# Patient Record
Sex: Female | Born: 1950 | Race: Black or African American | Hispanic: No | Marital: Married | Smoking: Former smoker
Health system: Southern US, Community
[De-identification: ages and names within clinical notes are randomized; demographics above are authoritative.]

## PROBLEM LIST (undated history)

## (undated) DIAGNOSIS — M25552 Pain in left hip: Secondary | ICD-10-CM

## (undated) DIAGNOSIS — H43399 Other vitreous opacities, unspecified eye: Secondary | ICD-10-CM

## (undated) DIAGNOSIS — F1193 Opioid use, unspecified with withdrawal: Secondary | ICD-10-CM

## (undated) DIAGNOSIS — M545 Low back pain, unspecified: Secondary | ICD-10-CM

## (undated) DIAGNOSIS — M79601 Pain in right arm: Secondary | ICD-10-CM

## (undated) DIAGNOSIS — R55 Syncope and collapse: Secondary | ICD-10-CM

## (undated) DIAGNOSIS — S0990XA Unspecified injury of head, initial encounter: Secondary | ICD-10-CM

## (undated) DIAGNOSIS — M329 Systemic lupus erythematosus, unspecified: Secondary | ICD-10-CM

## (undated) DIAGNOSIS — R569 Unspecified convulsions: Secondary | ICD-10-CM

## (undated) DIAGNOSIS — R42 Dizziness and giddiness: Secondary | ICD-10-CM

## (undated) DIAGNOSIS — E119 Type 2 diabetes mellitus without complications: Secondary | ICD-10-CM

## (undated) DIAGNOSIS — IMO0002 Reserved for concepts with insufficient information to code with codable children: Secondary | ICD-10-CM

## (undated) HISTORY — PX: ABDOMINAL HYSTERECTOMY: SHX81

---

## 2004-07-18 NOTE — ED Provider Notes (Signed)
[facility]                      EMERGENCY DEPARTMENT TREATMENT REPORT   NAME:  [name] [name]                    PT. LOCATION:     ER  ER25   MR #:         BILLING #: [id number]          DOS: 07/18/2004   TIME: 9:49 P   [id number]   cc:   Primary Physician:   Time of my evaluation: 2136.   CHIEF COMPLAINT:  Foot hurt.   HISTORY OF PRESENT ILLNESS: This is a 53 year old female with the complaint   of right foot pain now for about a week. It is swollen and painful.  She   saw her doctor and was put on Keflex yesterday, but states it has not   helped. She cannot take the pain anymore. Describes a throbbing pain.   Denies any history of injury. She has had no fever.  Has not had anything   like this previously.   REVIEW OF SYSTEMS:   CONSTITUTIONAL:  No fever, chills, weight loss.   ENT: No sore throat, runny nose or other URI symptoms.   RESPIRATORY:  No cough, shortness of breath, or wheezing.   CARDIOVASCULAR:  No chest pain, chest pressure, or palpitations.   GASTROINTESTINAL:  No vomiting, diarrhea, or abdominal pain.   MUSCULOSKELETAL:   Positive for right foot pain.   INTEGUMENTARY: Positive for swelling of the foot.   NEUROLOGICAL:  No headaches, sensory or motor symptoms.   Denies complaints in any other system.   PAST MEDICAL HISTORY:  Pancreatitis, asthma, peptic ulcer, osteoarthritis,   ____.   MEDICATIONS:   Albuterol nebulizer, amitriptyline, cephalexin, Mobic,   prednisone, Premarin, Protonix, ferrous sulfate, alprazolam, Soma,   glycosamine, Vicodin.   ALLERGIES:  Aspirin, calcium, and some medicines with Tylenol (above a   certain dose of Tylenol and she breaks out).   SOCIAL HISTORY: She is accompanied by her daughter.   PHYSICAL EXAMINATION:   GENERAL APPEARANCE: This is a well-developed female, appears   uncomfortable.   VITAL SIGNS: Blood pressure 124/85, pulse 95, respirations 20, temperature   96, O2 saturation 97% on room air.    HEENT:   Eyes:  Conjunctivae clear, lids normal.  Pupils equal,   symmetrical, and normally reactive.   Mouth/Throat:  Surfaces of the   pharynx, palate, and tongue are pink, moist, and without lesions.   NECK:  Supple, nontender, symmetrical, no masses or JVD, trachea midline,   thyroid not enlarged, nodular, or tender.   RESPIRATORY:  Clear and equal breath sounds.  No respiratory distress,   tachypnea, or accessory muscle use.   CARDIOVASCULAR:  Heart regular, without murmurs, gallops, rubs, or thrills.   PMI not displaced.   GASTROINTESTINAL:  Abdomen soft, nontender, without complaint of pain to   palpation.  No hepatomegaly or splenomegaly.   EXTREMITIES:  Right lower extremity: The knee is nontender. Calf is soft   and nontender. 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  Dorsum of the foot is extremely tender, mostly over the 2nd and   3rd  MTP region.  I cannot appreciate any obvious bony or soft tissue   abnormality.  She does have good pedal pulses, sensation intact, good   capillary refill.  The foot is warm and dry. There is no overlying erythema,   no fluctuance, no crepitus.  The calcaneus is nontender.  Achilles is   intact and nontender.   CONTINUATION BY JULIA HUBBARD, PA-C:   INITIAL IMPRESSION/MANAGEMENT PLAN:   This is a 53 year old female presents   for evaluation of right foot pain. At this time an IV will be started. She   will be given Dilaudid and Phenergan, and will obtain x-rays of the right   foot.   DIAGNOSTIC STUDIES:   X-rays of the right foot show no definite fracture   per ED attending.   COURSE IN THE EMERGENCY DEPARTMENT:   Dr. Claudette Laws was in to re-evaluate the   patient.  Findings were discussed that this may represent an inflammatory   process.  She was given Decadron.  She states she was given prednisone by   her doctor but did not fill it.  This time we will place her in a soft wrap    and walker with instructions provided. She will be given prescriptions for   Dilaudid 2 mg #20 and prednisone to continue.  I would like her call Dr.   Cletis Media tomorrow to scheduled follow up appointment.  Limit activity.   Certainly seek medical attention at anytime for new symptoms, any concerns.   Advised x-rays will be formally read by the radiologist.   FINAL DIAGNOSIS:  Acute right foot pain, possible tendonitis.   DISPOSITION:  The patient will be discharged home in stable condition.  To   follow up as above.  The patient evaluated by myself and Dr. Thane Edu   who agrees with above assessment plan.   Electronically Signed By:   Thornton Dales, M.D. 07/20/2004   04:44   ____________________________   Thornton Dales, M.D.   jdm/dh  D:  07/18/2004  T:  07/18/2004  9:54 P   [id number]   Fara Chute, PA

## 2010-10-10 LAB — AMB POC AVEE DRUG SCREEN: TRICYCLICS UR POC: POSITIVE

## 2010-10-10 MED ORDER — ALPRAZOLAM 0.5 MG TAB
0.5 mg | ORAL_TABLET | Freq: Three times a day (TID) | ORAL | Status: DC | PRN
Start: 2010-10-10 — End: 2010-12-03

## 2010-10-10 MED ORDER — OXYCODONE-ACETAMINOPHEN 10 MG-325 MG TAB
10-325 mg | ORAL_TABLET | Freq: Every day | ORAL | Status: DC
Start: 2010-10-10 — End: 2010-10-10

## 2010-10-10 MED ORDER — OXYCODONE-ACETAMINOPHEN 10 MG-325 MG TAB
10-325 mg | ORAL_TABLET | Freq: Every day | ORAL | Status: DC
Start: 2010-10-10 — End: 2010-12-03

## 2010-10-10 MED ORDER — LIDOCAINE 2 % MUCOSAL SOLN
2 % | Status: DC | PRN
Start: 2010-10-10 — End: 2014-11-26

## 2010-10-10 MED ORDER — ZOLPIDEM 10 MG TAB
10 mg | ORAL_TABLET | Freq: Every evening | ORAL | Status: DC | PRN
Start: 2010-10-10 — End: 2010-12-03
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
## 2010-10-25 NOTE — Telephone Encounter (Signed)
Returned patients call and there was no answer.  Message was left to return call.

## 2010-10-28 NOTE — Telephone Encounter (Signed)
Patient called and stated that she was having some increased pain.  Patient stated that she wanted sooner appointment.  First available was end of March.  Appointment was offered to patient and patient stated that she would wait until December 03, 2010, her scheduled appointment.

## 2010-12-03 MED ORDER — ZOLPIDEM 10 MG TAB
10 mg | ORAL_TABLET | Freq: Every evening | ORAL | Status: DC | PRN
Start: 2010-12-03 — End: 2011-01-30

## 2010-12-03 MED ORDER — CARISOPRODOL 250 MG TAB
250 mg | ORAL_TABLET | Freq: Three times a day (TID) | ORAL | Status: DC | PRN
Start: 2010-12-03 — End: 2011-02-27

## 2010-12-03 MED ORDER — OXYCODONE-ACETAMINOPHEN 10 MG-325 MG TAB
10-325 mg | ORAL_TABLET | Freq: Every day | ORAL | Status: DC
Start: 2010-12-03 — End: 2010-12-03

## 2010-12-03 MED ORDER — OXYCODONE-ACETAMINOPHEN 10 MG-325 MG TAB
10-325 mg | ORAL_TABLET | Freq: Every day | ORAL | Status: DC
Start: 2010-12-03 — End: 2011-01-30

## 2010-12-03 MED ORDER — ALPRAZOLAM 0.5 MG TAB
0.5 mg | ORAL_TABLET | Freq: Three times a day (TID) | ORAL | Status: DC | PRN
Start: 2010-12-03 — End: 2011-01-30

## 2010-12-03 NOTE — Progress Notes (Signed)
HISTORY OF PRESENT ILLNESS  [name] [name] is a 60 y.o. female. Patient presents for follow up of chronic widespread joint pain due to osteoarthritis, neck pain due to cervical DDD, and leg pain due to diabetic neuropathy. 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 She is a poor historian, and a highly complex patient.  She complains of frequent falls due to leg weakness. She relates this to a history of CVA three years ago, which has left her with residual weakness of the right side. She also reports that lupus causes intermittent leg pain and weakness. She states that Soma prescribed by Dr. Towana [name] in the past was very helpful for leg pain and weakness. I discussed with my concern that [name] [name] would actually increase her risk of falling, as it can be sedating in some patients. She adamantly states that conversely, she feels that her balance and strength is better when she takes this. We will allow her to take 250 mg sparingly as needed for spasms.  Neck pain and spasms continue to be constant and relatively severe. She describes the pain as burning, aching, tight, and tender. Pt reports average of 10/10 pain scale most days. She sees a rheumatologist at [facility] for treatment of lupus, for which she takes daily Prednisone. She cannot recall the dose.   She also continues to complain of painful burning pain in the legs and feet, which is attributable to diabetic neuropathy. She states that her blood glucose levels often stay high, between 150-200 in the mornings. She does not take insulin, but only takes Metformin twice daily.  Percocet helps significantly for pain, and she tolerates this well, without sedation or cognitive dysfunction. She finds, however, that it only lasts about 4 hours before the pain returns. We discussed treatment options at length--see treatment plan below.   She also takes Xanax "because I get depressed". She also acknowledges that she suffers with anxiety attacks that started after the death of her 26 year old granddaughter in March 2009 from sickle cell anemia. She declines counseling or psychiatry consult.  Sleep continues to be poor due to pain, but elavil and ambien together work well.  Time spent with patient discussing treatment plan and coordinating care: 50 minutes      HPI--see above    Review of Systems   HENT: Positive for neck pain.    Musculoskeletal: Positive for myalgias, back pain and joint pain.   Neurological: Positive for tingling, sensory change, focal weakness (of legs and feet) and headaches.   Psychiatric/Behavioral: Positive for depression. Negative for suicidal ideas. The patient is nervous/anxious and has insomnia (ambien with Elavil works well).      Physical Exam   Constitutional: She is oriented to person, place, and time. She appears well-developed and well-nourished. No distress.        In visible discomfort  Ambulates gingerly with walker     HENT:   Head: Normocephalic and atraumatic.   Eyes: EOM are normal.   Musculoskeletal:        Right elbow: tenderness found.        Left elbow: tenderness found.        Right hip: She exhibits tenderness.        Left hip: She exhibits tenderness.        Right knee: tenderness found.        Left knee: tenderness found.        Cervical back: She exhibits decreased range of motion, tenderness, bony tenderness, pain and spasm.  Lumbar back: She exhibits tenderness, bony tenderness, pain and spasm.        Back:         Arms:       Legs:       Areas of tenderness noted with red arrows  Brisk sacroiliac tenderness noted  Marked spasms of lumbar paraspinous musculature noted  Trochanteric bursae tender to palpation bilaterally      Neurological: She is alert and oriented to person, place, and time. She displays atrophy. A sensory deficit is present. No cranial nerve deficit. She exhibits normal muscle tone. Gait abnormal. Coordination normal.        Diminished sensation of lower tibias, right worse than left  Diminished muscle strength of right hand and right quadriceps, with mild atrophy of thenar eminence and quadriceps noted   Psychiatric: She has a normal mood and affect. Her behavior is normal. Judgment and thought content normal.        Occasionally tearful when recalling the death of her granddaughter       ASSESSMENT and PLAN  1. Rib pain    2. Pain in joint, multiple sites    3. Pain in limb    4. Idiopathic progressive polyneuropathy    5. Type II or unspecified type diabetes mellitus without mention of complication, not stated as uncontrolled    6. Lupus    7. Osteoarthrosis, unspecified whether generalized or localized, unspecified site    8. Anxiety state, unspecified    9. Neuralgia, neuritis, and radiculitis, unspecified    10. Myalgia and myositis, unspecified      Plan:  Increase percocet to up to six times daily for pain (every 4 hours)  We will prescribe soma 250 mg, but only take it when needed. Do not take this with Xanax, because it can increase your risk of falls  If you feel that the depression is getting worse, we can refer you to a good therapist or psychiatrist  2 month follow up or sooner if needed  For acute issues that cannot wait until your next office visit: my direct line is [phone] (do not call this line for scheduling regular appointments)

## 2010-12-03 NOTE — Progress Notes (Signed)
Oxycodone/apap 10/325 mg, did not bring

## 2010-12-03 NOTE — Patient Instructions (Signed)
Plan:  Increase percocet to up to six times daily for pain (every 4 hours)  We will prescribe soma 250 mg, but only take it when needed. Do not take this with Xanax, because it can increase your risk of falls  If you feel that the depression is getting worse, we can refer you to a good therapist or psychiatrist  2 month follow up or sooner if needed  For acute issues that cannot wait until your next office visit: my direct line is [phone] (do not call this line for scheduling regular appointments)
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
## 2011-01-04 NOTE — Progress Notes (Signed)
Addended by: Sanjuana Letters on: 01/04/2011 10:19 PM     Modules accepted: Level of Service

## 2011-01-30 MED ORDER — OXYCODONE-ACETAMINOPHEN 10 MG-325 MG TAB
10-325 mg | ORAL_TABLET | Freq: Every day | ORAL | Status: DC
Start: 2011-01-30 — End: 2011-02-27

## 2011-01-30 MED ORDER — ZOLPIDEM 10 MG TAB
10 mg | ORAL_TABLET | Freq: Every evening | ORAL | Status: DC | PRN
Start: 2011-01-30 — End: 2011-07-17

## 2011-01-30 MED ORDER — ALPRAZOLAM 0.5 MG TAB
0.5 mg | ORAL_TABLET | Freq: Three times a day (TID) | ORAL | Status: DC | PRN
Start: 2011-01-30 — End: 2011-06-20

## 2011-01-30 NOTE — Progress Notes (Signed)
Oxycodone/apap 10/325 mg, did not bring.

## 2011-01-30 NOTE — Patient Instructions (Signed)
Plan:  Continue same medications as prescribed for chronic pain--BRING ALL MEDICATIONS TO EACH VISIT  Continue xanax 0. 5 mg up to three times daily as needed--do not take more than is prescribed  We will refer you to a psychiatrist for management of depression and anxiety  Follow up in 2 months or sooner if needed  Questions or Concerns: my direct line is [phone]

## 2011-01-30 NOTE — Progress Notes (Signed)
HISTORY OF PRESENT ILLNESS  [name] [name] is a 60 y.o. female. Patient presents for follow up of chronic widespread joint pain due to osteoarthritis, neck pain due to cervical DDD, and leg pain due to diabetic neuropathy. 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 She is a poor historian.  She states that she has been "shaking like a leaf" for the past week. She describes that these tremors began in the hands and arms abruptly a week ago. She could not recall an inciting event, but she does note that she has been under extreme stress since her 2 year old daughter found out she was pregnant (she is 8th grade). She is tearful as she recounts this story, and she is devastated by the news. As we discussed this further, she admits that she self-increased her Xanax and ran out a week ago. She denies any seizure activity. We discussed the importance of not self-increasing medications. She is very tearful and apologetic.   She does note that Percocet works very well for pain, and is well tolerated. She was reminded to bring all medications to each visit.  Pt denies new or worsening insomnia or constipation issues.    HPI--see above    ROS  HENT: Positive for neck pain.    Musculoskeletal: Positive for myalgias, back pain and joint pain.   Neurological: Positive for tingling, sensory change, focal weakness (of legs and feet) and headaches.   Psychiatric/Behavioral: Positive for depression. Negative for suicidal ideas. The patient is nervous/anxious and has insomnia (ambien with Elavil works well).    Physical Exam  Constitutional: She is oriented to person, place, and time. She appears well-developed and well-nourished. No distress.        In visible discomfort, tearful and anxious  Ambulates gingerly with walker     HENT:   Head: Normocephalic and atraumatic.   Eyes: EOM are normal.   Musculoskeletal:        Right elbow: tenderness found.        Left elbow: tenderness found.         Right hip: She exhibits tenderness.        Left hip: She exhibits tenderness.        Right knee: tenderness found.        Left knee: tenderness found.        Cervical back: She exhibits decreased range of motion, tenderness, bony tenderness, pain and spasm.        Lumbar back: She exhibits tenderness, bony tenderness, pain and spasm.        Back:         Arms:       Legs:       Areas of tenderness noted with red arrows  Brisk sacroiliac tenderness noted  Marked spasms of lumbar paraspinous musculature noted  Trochanteric bursae tender to palpation bilaterally     Neurological: She is alert and oriented to person, place, and time. She displays atrophy. A sensory deficit is present. No cranial nerve deficit. She exhibits normal muscle tone. Gait abnormal. Coordination normal.        Diminished sensation of lower tibias, right worse than left  Diminished muscle strength of right hand and right quadriceps, with mild atrophy of thenar eminence and quadriceps noted; bilateral hands markedly tremulous   Psychiatric: She has a normal mood and affect. Her behavior is normal. Judgment and thought content normal.        Occasionally tearful   ASSESSMENT and PLAN  1.  Neuralgia, neuritis, and radiculitis, unspecified    2. Neuropathy in diabetes    3. Rib pain    4. Lupus    5. Osteoarthrosis, unspecified whether generalized or localized, unspecified site    6. Myalgia and myositis, unspecified    7. Pain in limb    8. Pain in joint, multiple sites    9. Type II or unspecified type diabetes mellitus without mention of complication, not stated as uncontrolled    10. Arthritis of knee    11. Encounter for long-term (current) use of other medications    12. 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 5 mg up to three times daily as needed--do not take more than is prescribed   We will refer you to a psychiatrist for management of depression and anxiety  Follow up in 2 months or sooner if needed  Questions or Concerns: my direct line is [phone]

## 2011-02-27 MED ORDER — OXYCODONE-ACETAMINOPHEN 10 MG-325 MG TAB
10-325 mg | ORAL_TABLET | Freq: Four times a day (QID) | ORAL | Status: DC
Start: 2011-02-27 — End: 2011-04-22

## 2011-02-27 MED ORDER — OXYCODONE-ACETAMINOPHEN 10 MG-325 MG TAB
10-325 mg | ORAL_TABLET | Freq: Four times a day (QID) | ORAL | Status: DC
Start: 2011-02-27 — End: 2011-02-27

## 2011-02-27 MED ORDER — AMITRIPTYLINE 150 MG TAB
150 mg | ORAL_TABLET | Freq: Every evening | ORAL | Status: DC
Start: 2011-02-27 — End: 2011-07-17

## 2011-02-27 MED ORDER — CARISOPRODOL 250 MG TAB
250 mg | ORAL_TABLET | Freq: Three times a day (TID) | ORAL | Status: DC | PRN
Start: 2011-02-27 — End: 2011-04-22

## 2011-02-27 NOTE — Patient Instructions (Signed)
Plan:  Continue same medications as prescribed for chronic pain--temporary increase in Percocet to up to eight per day for chronic pain  Follow up in 2 months or sooner if needed  For acute issues that cannot wait until your next office visit: my direct line is [phone] (do not call this line for scheduling regular appointments)

## 2011-02-27 NOTE — Progress Notes (Signed)
HISTORY OF PRESENT ILLNESS  [name] [name] is a 60 y.o. female. Patient presents for follow up of chronic widespread joint pain due to osteoarthritis, neck pain due to cervical DDD, and leg pain due to diabetic neuropathy. 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  She reports that she fell 10 days ago while trying to pick apples off of a tree in her front yard and injured her tailbone and right foot. She was diagnosed with a coccygeal fracture. While she was at the hospital, she suffered with sudden cognitive changes and altered mental status, and her blood pressure, when checked, was extremely low. She was therefore admitted to [facility], where she stayed for a week until they could figure out why she had become so hypotensive. Aparently, no concrete cause was found, so they discharged her after seven days. She has had no recurrence of her symptoms, but still complains of severe pain in the tailbone. She describes the pain as sharp, aching, and exquisitely tender. She also complains of severe lower back spasms. Pt reports average of 10/10 pain scale for the past ten days. .  Percocet continues to work fairly well for pain, but she finds that she needs extra percocet due to pain. We discussed treatment options at length--see treatment plan below.  She finds that she sleeps best with a combination of Ambien and Amitriptyline, and tolerates this combination quite well. No constipation issues noted.    HPI--see above    ROS  HENT: Positive for neck pain.    Musculoskeletal: Positive for myalgias, back pain and joint pain.   Neurological: Positive for tingling, sensory change, focal weakness (of legs and feet) and headaches.   Psychiatric/Behavioral: Positive for depression. Negative for suicidal ideas. The patient is nervous/anxious and has insomnia (ambien with Elavil works well).    Physical Exam   Constitutional: She is oriented to person, place, and time. She appears well-developed and well-nourished. No distress.        In visible discomfort, tearful and anxious  Ambulates gingerly with walker     HENT:   Head: Normocephalic and atraumatic.   Eyes: EOM are normal.   Musculoskeletal:        Right elbow: tenderness found.        Left elbow: tenderness found.        Right hip: She exhibits tenderness.        Left hip: She exhibits tenderness.        Right knee: tenderness found.        Left knee: tenderness found.        Cervical back: She exhibits decreased range of motion, tenderness, bony tenderness, pain and spasm.        Lumbar back: She exhibits tenderness, bony tenderness, pain and spasm.        Back:         Arms:       Legs:       Areas of tenderness noted with red arrows  Coccyx exquisitely tender to light palpation  Marked spasms of lumbar paraspinous musculature noted  Trochanteric bursae tender to palpation bilaterally     Neurological: She is alert and oriented to person, place, and time. She displays atrophy. A sensory deficit is present. No cranial nerve deficit. She exhibits normal muscle tone. Gait abnormal. Coordination normal.        Diminished sensation of lower tibias, right worse than left  Diminished muscle strength of right hand and right quadriceps, with mild atrophy  of thenar eminence and quadriceps noted; bilateral hands markedly tremulous   Psychiatric: She has a normal mood and affect. Her behavior is normal. Judgment and thought content normal.        Occasionally tearful   ASSESSMENT and PLAN  1. Coccygeal fracture    2. Neuralgia, neuritis, and radiculitis, unspecified    3. Neuropathy in diabetes    4. Rib pain    5. Lupus    6. Osteoarthrosis, unspecified whether generalized or localized, unspecified site    7. Myalgia and myositis, unspecified    8. Pain in limb    9. Pain in joint, multiple sites      Plan:   Continue same medications as prescribed for chronic pain--temporary increase in Percocet to up to eight per day for chronic pain  Follow up in 2 months or sooner if needed  Questions or Concerns: my direct line is [phone]

## 2011-02-27 NOTE — Progress Notes (Signed)
Oxycodone/apap 10/325 mg, 01/30/11, #16

## 2011-03-03 NOTE — Telephone Encounter (Signed)
Called into phar. [phone]  Xanax 0.5 mg take 1 tab tid #90 with 1 refills.

## 2011-04-10 NOTE — Telephone Encounter (Signed)
Called into phar. [phone] ambien 10 mg take 1 tab at bedtime #30 1 refill.

## 2011-04-22 MED ORDER — CARISOPRODOL 350 MG TAB
350 mg | ORAL_TABLET | Freq: Three times a day (TID) | ORAL | Status: DC | PRN
Start: 2011-04-22 — End: 2011-06-20

## 2011-04-22 MED ORDER — OXYCODONE-ACETAMINOPHEN 10 MG-325 MG TAB
10-325 mg | ORAL_TABLET | Freq: Four times a day (QID) | ORAL | Status: DC
Start: 2011-04-22 — End: 2011-04-22

## 2011-04-22 MED ORDER — OXYCODONE-ACETAMINOPHEN 10 MG-325 MG TAB
10-325 mg | ORAL_TABLET | Freq: Four times a day (QID) | ORAL | Status: DC
Start: 2011-04-22 — End: 2011-06-20

## 2011-04-22 NOTE — Progress Notes (Signed)
Soma 250 mg, 02/28/11, #43 with 1 refill.  Oxycodone/apap 10/325 mg, 03/28/11, #0  Xanax 0.5 mg, did not bring.

## 2011-04-22 NOTE — Patient Instructions (Signed)
Plan:  Continue same medications as prescribed for chronic pain--we will allow a one-time early fill on Percocet due to increased pain  Increase Soma to 350 mg up to three times daily as needed for muscle spasms  We will arrange an MRI of the lumbar spine at [place]  Follow up in 2 months or sooner if needed  For acute issues that cannot wait until your next office visit: my direct line is [phone] (do not call this line for scheduling regular appointments)

## 2011-04-22 NOTE — Progress Notes (Signed)
HISTORY OF PRESENT ILLNESS  [name] [name] is a 59 y.o. female. Patient presents for follow up of chronic widespread joint pain due to osteoarthritis, neck pain due to cervical DDD, and leg pain due to diabetic neuropathy. 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  She continues to complain of severe coccygeal pain since she sustained a coccyx fracture over two months ago. She describes the pain as exquisitely tender, sore, aching, and stabbing. Pt reports average of 10/10 pain scale most days. She is unable to walk any significant distance without stopping to rest. She is unwilling to try any injections to treat this. We discussed treatment options at length--see treatment plan below.   She notes that, while Percocet is helpful for pain, she has had to take extra due to increased pain. She notes that spasms in the lower back and legs have been worse, and she would like to increase her Soma to the 350 mg dose. She asserts that she does NOT take this along with her Xanax, and she denies any feelings of over-medication, dizziness, or sedation. She does admit that she loses her balance frequently, but attributes this to muscle rigidity and spasm. She notes leg numbness and weakness with prolonged standing and walking, but she cannot be sure if this is due to lower back issues or her diabetic neuropathy. We will schedule of the lower back and sacrum to evaluate further.  Pt denies new or worsening insomnia or constipation issues. Ambien continues to be effective for sleep.  HPI --see bove    ROS  HENT: Positive for neck pain.    Musculoskeletal: Positive for myalgias, back pain and joint pain.   Neurological: Positive for tingling, sensory change, focal weakness (of legs and feet) and headaches.   Psychiatric/Behavioral: Positive for depression. Negative for suicidal ideas. The patient is nervous/anxious and has insomnia (ambien with Elavil works well).    Physical Exam   Constitutional: She is oriented to person, place, and time. She appears well-developed and well-nourished. No distress.        In visible discomfort, tearful and anxious  Ambulates gingerly with walker     HENT:   Head: Normocephalic and atraumatic.   Eyes: EOM are normal.   Musculoskeletal:        Right elbow: tenderness found.        Left elbow: tenderness found.        Right hip: She exhibits tenderness.        Left hip: She exhibits tenderness.        Right knee: tenderness found.        Left knee: tenderness found.        Cervical back: She exhibits decreased range of motion, tenderness, bony tenderness, pain and spasm.        Lumbar back: She exhibits tenderness, bony tenderness, pain and spasm.        Back:         Arms:       Legs:       Areas of tenderness noted with red arrows  Coccyx exquisitely tender to light palpation  Marked spasms of lumbar paraspinous musculature noted  Trochanteric bursae tender to palpation bilaterally     Neurological: She is alert and oriented to person, place, and time. She displays atrophy. A sensory deficit is present. No cranial nerve deficit. She exhibits normal muscle tone. Gait abnormal. Coordination normal.        Diminished sensation of lower tibias, right worse than left  Diminished muscle strength of right hand and right quadriceps, with mild atrophy of thenar eminence and quadriceps noted; bilateral hands markedly tremulous   Psychiatric: She has a normal mood and affect. Her behavior is normal. Judgment and thought content normal.        Occasionally tearful   ASSESSMENT and PLAN  1. Fractured coccyx  MRI LUMB SPINE WO CONT   2. Lumbago  MRI LUMB SPINE WO CONT   3. Degeneration of lumbar or lumbosacral intervertebral disc  MRI LUMB SPINE WO CONT   4. Pain in limb     5. Neuralgia, neuritis, and radiculitis, unspecified  MRI LUMB SPINE WO CONT   6. Neuropathy in diabetes     7. Lupus      8. Osteoarthrosis, unspecified whether generalized or localized, unspecified site     9. Pain in joint, multiple sites     10. Type II or unspecified type diabetes mellitus without mention of complication, not stated as uncontrolled     11. Arthritis of knee     12. 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

## 2011-04-29 LAB — CBC WITH AUTOMATED DIFF
ABS. BASOPHILS: 0 10*3/uL (ref 0.0–0.06)
ABS. EOSINOPHILS: 0.3 10*3/uL (ref 0.0–0.4)
ABS. LYMPHOCYTES: 1.5 10*3/uL (ref 0.9–3.6)
ABS. MONOCYTES: 0.3 10*3/uL (ref 0.05–1.2)
ABS. NEUTROPHILS: 3.6 10*3/uL (ref 1.8–8.0)
BASOPHILS: 0 % (ref 0–2)
EOSINOPHILS: 5 % (ref 0–5)
HCT: 36.7 % (ref 35.0–45.0)
HGB: 11.6 g/dL — ABNORMAL LOW (ref 12.0–16.0)
LYMPHOCYTES: 27 % (ref 21–52)
MCH: 30.1 PG (ref 24.0–34.0)
MCHC: 31.6 g/dL (ref 31.0–37.0)
MCV: 95.1 FL (ref 74.0–97.0)
MONOCYTES: 5 % (ref 3–10)
MPV: 10.3 FL (ref 9.2–11.8)
NEUTROPHILS: 63 % (ref 40–73)
PLATELET: 231 10*3/uL (ref 135–420)
RBC: 3.86 M/uL — ABNORMAL LOW (ref 4.20–5.30)
RDW: 17.1 % — ABNORMAL HIGH (ref 11.6–14.5)
WBC: 5.7 10*3/uL (ref 4.6–13.2)

## 2011-04-29 LAB — METABOLIC PANEL, COMPREHENSIVE
A-G Ratio: 1.3 (ref 0.8–1.7)
ALT (SGPT): 35 U/L (ref 30–65)
AST (SGOT): 25 U/L (ref 15–37)
Albumin: 4 g/dL (ref 3.4–5.0)
Alk. phosphatase: 95 U/L (ref 50–136)
Anion gap: 4 mmol/L — ABNORMAL LOW (ref 5–15)
BUN/Creatinine ratio: 9 — ABNORMAL LOW (ref 12–20)
BUN: 7 MG/DL (ref 7–18)
Bilirubin, total: 0.2 MG/DL (ref 0.2–1.0)
CO2: 31 MMOL/L (ref 21–32)
Calcium: 8.9 MG/DL (ref 8.4–10.4)
Chloride: 102 MMOL/L (ref 100–108)
Creatinine: 0.8 MG/DL (ref 0.6–1.3)
GFR est AA: >60 mL/min/{1.73_m2} (ref >60)
GFR est non-AA: >60 mL/min/{1.73_m2} (ref >60)
Globulin: 3 g/dL (ref 2.0–4.0)
Glucose: 88 MG/DL (ref 74–99)
Potassium: 3.9 MMOL/L (ref 3.5–5.5)
Protein, total: 7 g/dL (ref 6.4–8.2)
Sodium: 137 MMOL/L (ref 136–145)

## 2011-04-29 LAB — DRUG SCREEN UR - NO CONFIRM
AMPHETAMINES: NEGATIVE
BARBITURATES: NEGATIVE
BENZODIAZEPINES: NEGATIVE
COCAINE: NEGATIVE
METHADONE: NEGATIVE
OPIATES: NEGATIVE
PCP(PHENCYCLIDINE): NEGATIVE
THC (TH-CANNABINOL): NEGATIVE

## 2011-04-29 LAB — LACTIC ACID: Lactic acid: 0.6 MMOL/L (ref 0.4–2.0)

## 2011-04-29 LAB — URINALYSIS W/ RFLX MICROSCOPIC
Bilirubin: NEGATIVE
Blood: NEGATIVE
Glucose: NEGATIVE MG/DL
Ketone: NEGATIVE MG/DL
Leukocyte Esterase: NEGATIVE
Nitrites: NEGATIVE
Protein: NEGATIVE MG/DL
Specific gravity: 1.01 (ref 1.003–1.030)
Urobilinogen: 0.2 EU/DL (ref 0.2–1.0)
pH (UA): 5.5 (ref 5.0–8.0)

## 2011-04-29 LAB — CPK-MB PANEL
CK - MB: <0.5 ng/ml — ABNORMAL LOW (ref 0.5–3.6)
CK: 95 U/L (ref 26–192)
Troponin-I, QT: <0.04 NG/ML (ref 0.00–0.08)

## 2011-04-29 LAB — TSH 3RD GENERATION: TSH: 1.65 u[IU]/mL (ref 0.51–6.27)

## 2011-04-29 LAB — GLUCOSE, POC: Glucose (POC): 97 mg/dL (ref 70–110)

## 2011-04-29 NOTE — H&P (Unsigned)
[facility]                  [address] [place], [place]  [address]                            HISTORY AND PHYSICAL REPORT    PATIENT:     [name] [name], [name] [name]  BILLING:         [id number]   ADMITTED:  04/29/2011  MRN:             [id number]    LOCATION:  [id number]  ATTENDING:   Sharyon Medicus, MD  DICTATING:   Sharyon Medicus, MD      CHIEF COMPLAINT: Passed out.    HISTORY OF PRESENT ILLNESS: The patient is a 60 year old African American  female with multiple medical problems including diabetes, coronary disease,  high cholesterol, seizure disorder among other problems who was in her  usual state of health until the day of admission when she was at a fast  food restaurant and sustained an episode of witnessed syncope. No  preceding chest pain, palpitation, no seizure activity. The patient was  transported to the ER. Initial workup was negative, and she was referred  for admission for further management. The patient had started Xanax  prescription that day, but her drug screen was negative  for benzodiazepines.    The patient currently is awake, able to provide history. Denies chest pain  or palpitations.    PAST MEDICAL HISTORY: Obtained from the patient as well as from her [facility] records. Summarized as follows    1. Coronary artery disease status post stents in the past as per the  patient's report, but not found in [place] records.  2. Type 2 diabetes.  3. High cholesterol.  4. History of hypertension and orthostasis hypotension.  5. Asthma.  6. Systemic lupus erythematosus previously on steroids.  7. History of venous thromboembolism.  8. Seizure disorder.  9. Gastroesophageal reflux disease.  10. Questionable old CVA.    PAST SURGICAL HISTORY: Cholecystectomy and hysterectomy.    ALLERGIES: ASPIRIN.    REGULAR MEDICATIONS  1. Elavil 50 mg at bedtime.  2. Neurontin 300 mg, take 2 tablets 3 times daily.  3. Lamictal 100 mg twice daily.   4. Sublingual nitroglycerin as needed.  5. Zofran as needed for nausea.  6. Metformin 500 mg twice a day.  7. Zocor 40 mg daily.  8. Prilosec 20 mg daily.  9. Vitamin D3, 1000 units daily.    SOCIAL HISTORY: She is separated. She is a former smoker and former drinker  and has 2 daughters.    REVIEW OF SYSTEMS: Again, no seizure activity. No new focal neurologic  symptoms. No agitation. No chest pain, no palpitations. No abdominal pain,  nausea, vomiting, diarrhea. No dysuria, polyuria, hematuria. The rest is as  per history of present illness.    Of note, the patient was admitted to [facility] in 01/2011 and  at the time, she had some orthostatic changes. It is reported in her  records that she also has history of pseudoseizures. She had an  echocardiogram in 2011 showing an ejection fraction of 65%. The patient was  previously on Coumadin for undocumented reasons, but she was taken off  during that admission. She was also taken off prednisone which was being  prescribed for her lupus.    PHYSICAL EXAMINATION  GENERAL: Pleasant  female in no acute distress.  VITAL SIGNS: Temperature 98.2, pulse 80, respirations 14, blood pressure  93/60.  HEENT: Atraumatic, normocephalic.  NECK: No adenopathy or thyromegaly.  LUNGS: Clear to auscultation.  HEART: Regular rhythm.  ABDOMEN: Soft and nontender, nondistended.  EXTREMITIES: No cyanosis or edema.  NEUROLOGIC: Awake, alert, oriented x3. She has no definite focal deficits  during my exam.    LABORATORY DATA: She had CBC showing WBC 5.7, H and H 11.6 and 36.7. Normal  differential. Electrolytes within normal range. Blood sugar normal. Liver  functions normal. Troponin less than 0.04. Lactic acid 0.6. TSH 1.6. Urine  drug screen negative and urinalysis negative. CT of the head negative. EKG  shows sinus rhythm and left atrial enlargement.    IMPRESSION  1. Syncopal episode.  2. Hypotension.  3. Diabetes.  4. High cholesterol.   5. History of coronary artery disease.  6. History of lupus.  7. Questionable history of prior stroke.  8. Seizure disorder.  9. History of pseudoseizures.  10. As per past medical history.    PLAN: I would continue hydration with normal saline, monitoring on  telemetry. Repeat cardiac enzymes. Tilt table test, echocardiogram.  Consider further workup accordingly.                             Preliminary                                     Yidel Teuscher Patrecia Pour, MD        NTT:wmx  D: 04/29/2011  8:43 A T: 04/29/2011  9:26 A  Job #:  [id number]  CScriptDoc #:  [id number]  cc:   Braylen Denunzio Patrecia Pour, MD

## 2011-04-30 LAB — GLUCOSE, POC: Glucose (POC): 60 mg/dL — ABNORMAL LOW (ref 70–110)

## 2011-04-30 NOTE — Procedures (Signed)
[facility] CARDIOVASCULAR SERVICES                             [facility]                               [address]                            [place], [place] [address]                              CARDIAC TILT TABLE      PATIENT:      [name] [name], [name] [name]  MRN:              [id number]    DATE:  BILLING:          [id number]   LOCATION:   [id number]  REFERRING:    Sharyon Medicus, MD  INTERPRETING: Merian Capron, MD      ORDERING PHYSICIAN: Dr. Valerie Roys    INDICATION: Syncope.    FINDINGS: A resting telemetry strip showed normal sinus rhythm with a heart  rate of 76 beats per minute. The patient was in the supine position for 10  minutes, where she remained in sinus rhythm with a heart rate in the 70s  and blood pressure averaged 112/71. The patient was then placed in the  upright 60-degree position for 40 minutes. Her blood pressure decreased  slightly to a nadir of 95/73 and heart rate increased from the mid  70s to 88 beats per minute in normal sinus rhythm. The patient remained  asymptomatic in the 60-degree upright position. The patient was placed back  in the supine position where her heart rate and blood pressure returned to  baseline.    CONCLUSIONS: Mild orthostatic hypotension with appropriate heart rate  response.    No significant dysrhythmias present during the test. No syncope or near  syncope throughout the test.                            Preliminary                                       Merian Capron, MD    MR:wmx  D:04/30/2011  4:54 P T: 04/30/2011  5:21 P  CQDocID #: [id number]  CScriptDoc #:  [id number]  cc:   Merian Capron, MD        NABIL Patrecia Pour, MD

## 2011-04-30 NOTE — Procedures (Signed)
[place] CARDIOVASCULAR SERVICES                             [facility]                               [address]                            [place], [place] [address]                              CARDIAC TILT TABLE      PATIENT:      [name], [name] W  MRN:              [id number]    DATE:  BILLING:          [id number]   LOCATION:   [id number]  REFERRING:    NABIL T TADROS, MD  INTERPRETING: Vaiden Adames, MD      ORDERING PHYSICIAN: Dr. Nabil Tadros    INDICATION: Syncope.    FINDINGS: A resting telemetry strip showed normal sinus rhythm with a heart  rate of 76 beats per minute. The patient was in the supine position for 10  minutes, where she remained in sinus rhythm with a heart rate in the 70s  and blood pressure averaged 112/71. The patient was then placed in the  upright 60-degree position for 40 minutes. Her blood pressure decreased  slightly to a nadir of 95/73 and heart rate increased from the mid  70s to 88 beats per minute in normal sinus rhythm. The patient remained  asymptomatic in the 60-degree upright position. The patient was placed back  in the supine position where her heart rate and blood pressure returned to  baseline.    CONCLUSIONS: Mild orthostatic hypotension with appropriate heart rate  response.    No significant dysrhythmias present during the test. No syncope or near  syncope throughout the test.                            Preliminary                                       Timi Reeser, MD    MR:wmx  D:04/30/2011  4:54 P T: 04/30/2011  5:21 P  CQDocID #: [id number]  CScriptDoc #:  [id number]  cc:   Daisi Kentner, MD        NABIL T TADROS, MD

## 2011-05-01 NOTE — Discharge Summary (Unsigned)
[facility]                  [address], [place]  [address]                                 DISCHARGE SUMMARY    PATIENT:     [name] [name], [name] [name]  MRN:             [id number]  ADMITTED:      04/29/2011  BILLING:         [id number] DISCHARGED:    04/30/2011  ATTENDING:   Sharyon Medicus, MD  DICTATING:   Sharyon Medicus, MD        DISCHARGE DIAGNOSES:  1. Syncopal episode.  2. Hypotension--resolved.  3. Type 2 diabetes--currently diet-controlled.  4. High cholesterol.  5. History of hypertension and orthostatic hypotension in the past, but  negative tilt table test during this admission.  6. History of asthma.  7. History of systemic lupus erythematosus, previously on steroids in the  past.  8. History of venous thromboembolism on Coumadin in the past.  9. Seizure disorder.  10. History of pseudoseizures.  11. Questionable history of old cerebrovascular accident.  12. Gastroesophageal reflux disease.  13. Status post cholecystectomy and hysterectomy in the past.    ALLERGIES: ALLERGIC TO ASPIRIN.    DISCHARGE MEDICATIONS:  1. Elavil 50 mg at bedtime.  2. Neurontin 600 mg 3 times daily.  3. Lamictal 100 mg twice day.  4. Sublingual nitroglycerin as needed.  5. Zocor 40 mg daily.  6. Prilosec 20 mg daily.  7. Vitamin D3 1000 units daily.  8. Metformin 500 mg b.i.d.    DISPOSITION: Home.    FOLLOWUP: Follow up with PCP as directed.    TESTS DURING THIS ADMISSION: Echocardiogram within normal limits. Tilt  table test within normal limits. Telemetry monitoring, no arrhythmia. No  seizure disorder.    HOSPITAL COURSE: The patient was admitted, hydrated, and monitored. Labs  were unremarkable. She underwent echocardiogram without significant  abnormality. She underwent tilt table test, which was negative. On the  monitor, she was in sinus rhythm. Her labs were unremarkable, and she is  ready to go home at this time.                        Preliminary                                    Afsheen Antony Patrecia Pour, MD    NTT:wmx  D: 04/30/2011  5:15 P T: 05/01/2011  9:36 A  Job #:  [id number]  CScriptDoc #:  [id number]  cc:   Jodye Scali Patrecia Pour, MD

## 2011-05-04 LAB — CULTURE, BLOOD
Culture result:: NO GROWTH
Culture result:: NO GROWTH

## 2011-06-20 LAB — AMB POC AVEE DRUG SCREEN
BENZODIAZEPINES UR POC: POSITIVE
TRICYCLICS UR POC: POSITIVE

## 2011-06-20 MED ORDER — OXYCODONE-ACETAMINOPHEN 10 MG-325 MG TAB
10-325 mg | ORAL_TABLET | Freq: Four times a day (QID) | ORAL | Status: DC
Start: 2011-06-20 — End: 2011-06-20

## 2011-06-20 MED ORDER — CARISOPRODOL 350 MG TAB
350 mg | ORAL_TABLET | Freq: Three times a day (TID) | ORAL | Status: DC | PRN
Start: 2011-06-20 — End: 2011-06-20

## 2011-06-20 MED ORDER — OXYCODONE-ACETAMINOPHEN 10 MG-325 MG TAB
10-325 mg | ORAL_TABLET | Freq: Four times a day (QID) | ORAL | Status: DC
Start: 2011-06-20 — End: 2011-07-17

## 2011-06-20 MED ORDER — CARISOPRODOL 350 MG TAB
350 mg | ORAL_TABLET | Freq: Three times a day (TID) | ORAL | Status: DC | PRN
Start: 2011-06-20 — End: 2011-07-17

## 2011-06-20 MED ORDER — ALPRAZOLAM 0.5 MG TAB
0.5 mg | ORAL_TABLET | Freq: Three times a day (TID) | ORAL | Status: DC | PRN
Start: 2011-06-20 — End: 2011-07-17

## 2011-06-20 NOTE — Progress Notes (Signed)
HISTORY OF PRESENT ILLNESS  [name] [name] is a 60 y.o. female. Patient presents for follow up of chronic widespread joint pain due to osteoarthritis, neck pain due to cervical DDD, and leg pain due to diabetic neuropathy. 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  She states that she sustained a fall on her left side two weeks ago while she was moving out of her home into a new residence. She did not seek medical attention, but complains of persistent right-sided lower back pain and spasms since the fall. She also notes worsening of pain in the hands and feet, with aching, burning, and tingling. She attributes this to complications from diabetes. She also continues to complain of persistent pain in the neck, lower back, and sacrum. She describes the pain as aching, stabbing, stiff, and tender. Pt reports average of 5-8/10 pain scale most days, depending on activity. Any amount of walking, lifting, bending, or standing will exacerbate symptoms.  Medications continue to work well for pain control overall. She is tolerating her medications well, with no untoward side effects noted. The patient reports an average of 75% relief with this regimen.   She has been anxious lately since her 69 year-old granddaughter is "due any day" with her first child. She intends to be the primary caregiver, as her granddaughter will be returning to school soon after the birth.   Pt denies new or worsening insomnia or constipation issues.   HPI--see above    ROS  HENT: Positive for neck pain.    Musculoskeletal: Positive for myalgias, back pain and joint pain.   Neurological: Positive for tingling, sensory change, focal weakness (of legs and feet) and headaches.   Psychiatric/Behavioral: Positive for depression. Negative for suicidal ideas. The patient is nervous/anxious and has insomnia (ambien with Elavil works well).    Physical Exam   Constitutional: She is oriented to person, place, and time. She appears well-developed and well-nourished. No distress.        In visible discomfort  Ambulates gingerly with walker     HENT:   Head: Normocephalic and atraumatic.   Eyes: EOM are normal.   Musculoskeletal:        Right elbow: tenderness found.        Left elbow: tenderness found.        Right hip: She exhibits tenderness.        Left hip: She exhibits tenderness.        Right knee: tenderness found.        Left knee: tenderness found.        Cervical back: She exhibits decreased range of motion, tenderness, bony tenderness, pain and spasm.        Lumbar back: She exhibits tenderness, bony tenderness, pain and spasm.        Back:         Arms:       Legs:       Areas of tenderness noted with red arrows  Coccyx exquisitely tender to light palpation  Marked spasms of lumbar paraspinous musculature noted  Trochanteric bursae tender to palpation bilaterally     Neurological: She is alert and oriented to person, place, and time. She displays atrophy. A sensory deficit is present. No cranial nerve deficit. She exhibits normal muscle tone. Gait abnormal. Coordination normal.        Diminished sensation of lower tibias, right worse than left  Diminished muscle strength of right hand and right quadriceps, with mild atrophy of thenar eminence  and quadriceps noted; bilateral hands markedly tremulous   Psychiatric: She has a normal mood and affect. Her behavior is normal. Judgment and thought content normal.        Occasionally tearful   ASSESSMENT and PLAN  1. Neuralgia, neuritis, and radiculitis, unspecified    2. Neuropathy in diabetes    3. Rib pain    4. Lupus    5. Osteoarthrosis, unspecified whether generalized or localized, unspecified site    6. Myalgia and myositis, unspecified    7. Pain in limb    8. Pain in joint, multiple sites    9. Type II or unspecified type diabetes mellitus without mention of complication, not stated as uncontrolled     10. 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

## 2011-06-20 NOTE — Progress Notes (Signed)
Addended by: Linnae Rasool T on: 06/20/2011 03:39 PM     Modules accepted: Orders

## 2011-06-20 NOTE — Progress Notes (Signed)
Addended by: Angelyn Punt on: 06/20/2011 03:19 PM     Modules accepted: Orders

## 2011-06-20 NOTE — Patient Instructions (Signed)
Plan:  Continue same medications as prescribed for chronic pain  Follow up in 1 months or sooner if needed  For acute issues that cannot wait until your next office visit: my direct line is [phone] (do not call this line for scheduling regular appointments)

## 2011-06-20 NOTE — Progress Notes (Signed)
Xanax 0.5 mg, 05/26/11, #22  Soma 350 mg, 05/19/11, #21  Percocet 10/325 mg, 05/21/11, #0

## 2011-07-17 MED ORDER — OXYCODONE-ACETAMINOPHEN 10 MG-325 MG TAB
10-325 mg | ORAL_TABLET | Freq: Four times a day (QID) | ORAL | Status: DC
Start: 2011-07-17 — End: 2011-07-17

## 2011-07-17 MED ORDER — OXYCODONE-ACETAMINOPHEN 10 MG-325 MG TAB
10-325 mg | ORAL_TABLET | Freq: Four times a day (QID) | ORAL | Status: DC
Start: 2011-07-17 — End: 2011-09-11

## 2011-07-17 MED ORDER — ZOLPIDEM 10 MG TAB
10 mg | ORAL_TABLET | Freq: Every evening | ORAL | Status: DC | PRN
Start: 2011-07-17 — End: 2011-11-07

## 2011-07-17 MED ORDER — CARISOPRODOL 350 MG TAB
350 mg | ORAL_TABLET | Freq: Three times a day (TID) | ORAL | Status: DC | PRN
Start: 2011-07-17 — End: 2011-09-11

## 2011-07-17 MED ORDER — AMITRIPTYLINE 150 MG TAB
150 mg | ORAL_TABLET | Freq: Every evening | ORAL | Status: DC
Start: 2011-07-17 — End: 2011-11-07

## 2011-07-17 MED ORDER — ALPRAZOLAM 0.5 MG TAB
0.5 mg | ORAL_TABLET | Freq: Three times a day (TID) | ORAL | Status: DC | PRN
Start: 2011-07-17 — End: 2011-09-11

## 2011-07-17 NOTE — Patient Instructions (Signed)
1. You are doing well with the present pain medicine regimen, no changes are needed.    2. Good luck with the new granddaughter just arriving today!  Have a good holiday!

## 2011-07-17 NOTE — Progress Notes (Signed)
Percocet 10/325mg  Filled 06/20/11 #5

## 2011-07-17 NOTE — Progress Notes (Signed)
HISTORY OF PRESENT ILLNESS  HPI  [name] [name] is a 60 y.o. female. Patient presents for follow up of chronic widespread joint pain due to osteoarthritis, neck pain due to cervical DDD, and leg pain due to diabetic neuropathy. 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 It was determined to not be fractured, and has been taped to the 3rd toe. She says her "tailbone" was fractured in June and has been hurting more lately, aggravated by muscle spasms. She rates her pain today at 10/10 and says she gets up to 80% pain relief with her current regimen.  She denies constipation issues, and takes Elavil and Ambien for sleep.    Review of Systems   Constitutional: Negative.    HENT: Positive for congestion and neck pain.    Respiratory: Negative.         Hx asthma/copd   Gastrointestinal: Positive for heartburn, abdominal pain and constipation.   Genitourinary: Positive for frequency (nighttime).   Musculoskeletal: Positive for myalgias, back pain and joint pain.   Skin: Negative.    Neurological: Positive for tremors, sensory change, focal weakness and headaches.   Endo/Heme/Allergies: Bruises/bleeds easily.   Psychiatric/Behavioral: Negative for depression and memory loss. The patient has insomnia.        Physical Exam   Constitutional: She is oriented to person, place, and time. She appears well-developed and well-nourished. No distress.   HENT:   Head: Normocephalic and atraumatic.   Eyes: Conjunctivae are normal. Pupils are equal, round, and reactive to light.   Neck: Normal range of motion.   Cardiovascular: Normal rate.    Pulmonary/Chest: Effort normal.   Musculoskeletal: She exhibits tenderness (very ttp bilat cervical/upper trapezius and bilat lumbar area). She exhibits no edema.   Neurological: She is alert and oriented to person, place, and time.   Skin: Skin is warm and dry. No rash noted.    Psychiatric: She has a normal mood and affect. Her behavior is normal. Judgment and thought content normal.       ASSESSMENT and PLAN  1. Pain in joint, multiple sites    2. Neuralgia, neuritis, and radiculitis, unspecified    3. Neuropathy in diabetes    4. Rib pain    5. Lupus    6. Osteoarthrosis, unspecified whether generalized or localized, unspecified site    7. Myalgia and myositis, unspecified    8. Type II or unspecified type diabetes mellitus without mention of complication, not stated as uncontrolled      current treatment plan is effective, no change in therapy  reviewed medications and side effects in detail

## 2011-09-05 NOTE — Telephone Encounter (Signed)
Pt called stating she went to the ER due to back spasms and rec'd a hydrocodone script. Also stated she needed soma. I tried calling the pt back but her voice mailbox is not set up, I called 3 times [phone].

## 2011-09-11 MED ORDER — OXYCODONE-ACETAMINOPHEN 10 MG-325 MG TAB
10-325 mg | ORAL_TABLET | Freq: Four times a day (QID) | ORAL | Status: DC
Start: 2011-09-11 — End: 2011-09-11

## 2011-09-11 MED ORDER — OXYCODONE-ACETAMINOPHEN 10 MG-325 MG TAB
10-325 mg | ORAL_TABLET | Freq: Four times a day (QID) | ORAL | Status: DC
Start: 2011-09-11 — End: 2011-11-07

## 2011-09-11 MED ORDER — ALPRAZOLAM 0.5 MG TAB
0.5 mg | ORAL_TABLET | Freq: Every evening | ORAL | Status: DC | PRN
Start: 2011-09-11 — End: 2011-11-07

## 2011-09-11 MED ORDER — CARISOPRODOL 350 MG TAB
350 mg | ORAL_TABLET | Freq: Two times a day (BID) | ORAL | Status: DC | PRN
Start: 2011-09-11 — End: 2011-11-07

## 2011-09-11 NOTE — Progress Notes (Signed)
Patient presents today for follow up medication check. Pain scale 9/10 low back pain, no numbness ot tingling and left foot bunion pain.  Pill count as follows: Soma patient didn't bring bottle, Percocet 10/325 fill date 08/13/11, Xanax 0.5 mg, fill date 08/30/11, # 16

## 2011-09-11 NOTE — Patient Instructions (Signed)
1. No changes in the percocet as your main pain medicine at this time. We need to consider changing to a long-acting medicine. We will consider MS Contin is long-acting morphine.... Methadone is another good pain medicine. We need to find something that your insurance will cover.    2. Reducing the xanax to one tablet at bedtime, which you take along with ambien and Elavil.  Please do not take a Soma within 2 hours of these bedtime medicines.     3. Return in 8 weeks.

## 2011-09-11 NOTE — Progress Notes (Signed)
HISTORY OF PRESENT ILLNESS  HPI  [name] [name] is a 61 y.o. female. Patient presents for follow up of chronic widespread joint pain due to osteoarthritis, neck pain due to cervical DDD, and leg pain due to diabetic neuropathy. 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  She gets up to 80% pain relief with her current regimen and she rates her pain today at 10/10 as she has run out of pain medicines 3 days ago.  She denies constipation issues, and takes Elavil and Ambien for sleep. She also takes a Herbalist at bedtime as well. Today she c/o "a lot of stress with too many people going through my house".     Review of Systems   Constitutional: Positive for malaise/fatigue.   HENT: Positive for congestion and neck pain.    Eyes: Positive for blurred vision (floaters).   Respiratory: Positive for cough (COPD).    Gastrointestinal: Positive for heartburn, abdominal pain and diarrhea.   Genitourinary: Positive for frequency.   Musculoskeletal: Positive for myalgias, back pain and joint pain.   Neurological: Positive for sensory change, focal weakness and headaches.   Endo/Heme/Allergies: Negative.    Psychiatric/Behavioral: Positive for depression and memory loss (maybe). The patient is nervous/anxious and has insomnia.        Physical Exam   Constitutional: She appears well-developed and well-nourished. No distress.   HENT:   Head: Normocephalic and atraumatic.   Eyes: Conjunctivae are normal. Pupils are equal, round, and reactive to light.   Neck: Normal range of motion.   Cardiovascular: Normal rate.    Pulmonary/Chest: Effort normal.   Musculoskeletal: She exhibits tenderness. She exhibits no edema.   Neurological: She is alert.   Skin: Skin is warm and dry. No rash noted.   Psychiatric: She has a normal mood and affect. Her behavior is normal. Judgment and thought content normal.       ASSESSMENT and PLAN  1. Pain in joint, multiple sites    2. Neuralgia, neuritis, and radiculitis, unspecified    3. Neuropathy in  diabetes    4. Lupus    5. Osteoarthrosis, unspecified whether generalized or localized, unspecified site    6. Myalgia and myositis, unspecified    7. Type II or unspecified type diabetes mellitus without mention of complication, not stated as uncontrolled    8. Arthritis of knee    9. Encounter for long-term (current) use of other medications    10. Back pain, lumbosacral      the following changes in treatment are made: See patient instructions for details of treatment plan.  reviewed medications and side effects in detail

## 2011-10-07 NOTE — Telephone Encounter (Signed)
FYI done for patient in the chart.

## 2011-11-07 MED ORDER — GABAPENTIN 300 MG CAP
300 mg | ORAL_CAPSULE | Freq: Three times a day (TID) | ORAL | Status: DC
Start: 2011-11-07 — End: 2014-11-26

## 2011-11-07 MED ORDER — ZOLPIDEM 5 MG TAB
5 mg | ORAL_TABLET | Freq: Every evening | ORAL | Status: DC | PRN
Start: 2011-11-07 — End: 2012-01-02

## 2011-11-07 MED ORDER — CARISOPRODOL 350 MG TAB
350 mg | ORAL_TABLET | Freq: Three times a day (TID) | ORAL | Status: DC | PRN
Start: 2011-11-07 — End: 2012-01-02

## 2011-11-07 MED ORDER — OXYCODONE-ACETAMINOPHEN 10 MG-325 MG TAB
10-325 mg | ORAL_TABLET | Freq: Four times a day (QID) | ORAL | Status: DC
Start: 2011-11-07 — End: 2011-11-07

## 2011-11-07 MED ORDER — ALPRAZOLAM 0.5 MG TAB
0.5 mg | ORAL_TABLET | Freq: Two times a day (BID) | ORAL | Status: DC | PRN
Start: 2011-11-07 — End: 2012-01-02

## 2011-11-07 MED ORDER — AMITRIPTYLINE 75 MG TAB
75 mg | ORAL_TABLET | Freq: Every evening | ORAL | Status: DC
Start: 2011-11-07 — End: 2012-01-02

## 2011-11-07 MED ORDER — OXYCODONE-ACETAMINOPHEN 10 MG-325 MG TAB
10-325 mg | ORAL_TABLET | Freq: Four times a day (QID) | ORAL | Status: DC
Start: 2011-11-07 — End: 2012-01-02

## 2011-11-07 NOTE — Patient Instructions (Addendum)
1. No big changes today, just small "tweaking" so you are taking safe doses, especially before bed--  ?? Percocets two tablets four times daily, as your long-acting pain medicine.  ?? Gabapentin is for the burning leg pain.  ?? Xanax 0.5mg  can be taken twice daily (one at bedtime)  ?? Amitriptylline is reduced to 75mg  taken at bedtime.  ?? Ambien is reduced to 5mg  one tablet at bedtime.  ?? Soma 350mg  -- you can take 3 times a day.... Be sure to take 2 hours before the nighttime xanax.  ?? Your medicines are being organized into weekly dispenser by your daughter, and she is also making you a daily list of how/when to take your medicines.    2. Start gabapentin 300 mg (Neurontin): you can take all at bedtime as may make you drowsy. What we are looking for is a decrease in nerve pain... and begin as follows:  Day 1 - take  1 tablet   Day 2 - 2 tablets;  Day 3 take 3 tablets;   Day 4 take 4 tablets and continue increasing until six tablets daily, divided however works best for you or all before bedtime.  Discontinue if you have bad side effects.  The 2 main side effects drowsiness and forgetfullness.    3. Return in 8 weeks.... Please get with the new PCP to get the diabetes under good control, especially the nighttime low glucose.... Eat some peanut butter before bedtime!

## 2011-11-07 NOTE — Progress Notes (Signed)
Patient presents today for follow up medication check. Pain scale 7/ 10 for constant right jaw pain and right knee.  Pill count as follows Soma 350 mg 10/09/11 # 2                                    Percocet filled 10/10/11 # 0

## 2011-11-07 NOTE — Progress Notes (Signed)
HISTORY OF PRESENT ILLNESS  HPI  [name] [name] is a 61 y.o. female. Patient presents for follow up of chronic widespread joint pain due to osteoarthritis, neck pain due to cervical DDD, and burning leg pain due to diabetic neuropathy. 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 She gets up to 80% pain relief with her current regimen and she rates her pain today at 7/10 yesterday. She denies constipation issues, and takes Elavil and Ambien for sleep. She also takes a Herbalist at bedtime as well. Today she c/o additional pain from wisdom tooth and is looking for a dentist that will take her insurance. She has a left foot bunion that is causing some pain as well. Her main pain generators are the lumbar/sacral pain and right knee pain.      ROS  Constitutional: Positive for malaise/fatigue.   HENT: Positive for congestion and neck pain.   Eyes: Negative.   Respiratory: Positive for cough (COPD).   Gastrointestinal: Positive for heartburn.  Genitourinary: Positive for frequency.   Musculoskeletal: Positive for myalgias, back pain and joint pain.   Neurological: Positive for sensory change, focal weakness and headaches.   Endo/Heme/Allergies: Positive for easy bruising.   Psychiatric/Behavioral: Positive for depression and memory loss (sometimes). The patient is nervous/anxious and has insomnia.       Physical Exam    ASSESSMENT and PLAN  1. Back pain, lumbosacral    2. Neuralgia, neuritis, and radiculitis, unspecified    3. Neuropathy in diabetes    4. Rib pain    5. Lupus    6. Osteoarthrosis, unspecified whether generalized or localized, unspecified site    7. Myalgia and myositis, unspecified    8. Pain in joint, multiple sites    9. Type II or unspecified type diabetes mellitus without mention of complication, not stated as uncontrolled    10. Arthritis of knee    11. Encounter for long-term (current) use of other medications    12. 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  reviewed diet, exercise and weight control  reviewed medications and side effects in detail

## 2011-11-27 NOTE — Telephone Encounter (Signed)
MD from [facility] called to clarify patients pain medications.  Patient has been admitted there for a fall.

## 2012-01-02 MED ORDER — OXYCODONE-ACETAMINOPHEN 10 MG-325 MG TAB
10-325 mg | ORAL_TABLET | Freq: Four times a day (QID) | ORAL | Status: DC
Start: 2012-01-02 — End: 2012-01-02

## 2012-01-02 MED ORDER — OXYCODONE-ACETAMINOPHEN 10 MG-325 MG TAB
10-325 mg | ORAL_TABLET | Freq: Four times a day (QID) | ORAL | Status: DC
Start: 2012-01-02 — End: 2012-03-29

## 2012-01-02 MED ORDER — AMITRIPTYLINE 75 MG TAB
75 mg | ORAL_TABLET | Freq: Every evening | ORAL | Status: DC
Start: 2012-01-02 — End: 2012-03-29

## 2012-01-02 MED ORDER — CARISOPRODOL 350 MG TAB
350 mg | ORAL_TABLET | Freq: Three times a day (TID) | ORAL | Status: DC | PRN
Start: 2012-01-02 — End: 2012-03-29

## 2012-01-02 MED ORDER — ZOLPIDEM 5 MG TAB
5 mg | ORAL_TABLET | Freq: Every evening | ORAL | Status: DC | PRN
Start: 2012-01-02 — End: 2012-03-29

## 2012-01-02 MED ORDER — ALPRAZOLAM 0.5 MG TAB
0.5 mg | ORAL_TABLET | Freq: Two times a day (BID) | ORAL | Status: DC | PRN
Start: 2012-01-02 — End: 2012-03-29

## 2012-01-02 NOTE — Progress Notes (Signed)
Patient presents today for 8 week follow up medication check. Pain scale 8/ 10  For constant back pain in tailbone and feet pain. Left cheek  Patient didn't bring medication percocet today 1st offense and was counseled about the pain policy

## 2012-01-02 NOTE — Patient Instructions (Addendum)
1. No changes are needed in the pain medicine regimen, you are stable and compliant with medications, and getting good pain relief.    2. Wean off the gabapentin because you are not getting any benefit with 1800mg  daily.  ?? Reduce by one tablet every 3 days or so.    3. Try nasal flushing to help with the sinus congestion, as directed. 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 If not, you can talk with your PCP or dentist.    4. Return in 12 weeks due to scheduling difficulties.

## 2012-01-02 NOTE — Progress Notes (Signed)
HISTORY OF PRESENT ILLNESS  HPI  [name] [name] is a 61 y.o. female. Patient presents for follow up of chronic widespread joint pain due to osteoarthritis, neck pain due to cervical DDD, and burning leg pain due to diabetic neuropathy. 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 She gets up to 80% pain relief with her current regimen and she rates her pain today at 7/10 yesterday. She denies constipation issues, and takes Elavil and Ambien for sleep. She also takes a Herbalist at bedtime as well. Today she c/o additional pain from wisdom tooth and is looking for a dentist that will take her insurance. She has a left foot bunion that is causing some pain as well. Her main pain generators are the lumbar/sacral pain and right knee pain.   Today she reports a bad fall that she blames on Ambien dosage despite being lowered at last visit. She says she is doing well now. She also has been eating a snack before bedtime with carbs and protein.  She c/o of new onset of right jaw pain.    ROS  Constitutional: Positive for malaise/fatigue.   HENT: Positive for congestion and neck pain.   Eyes: Negative.   Respiratory: Positive for cough (COPD).   Gastrointestinal: Positive for heartburn.  Genitourinary: Positive for frequency.   Musculoskeletal: Positive for myalgias, back pain and joint pain.   Neurological: Positive for sensory change, focal weakness and headaches.   Endo/Heme/Allergies: Positive for easy bruising.   Psychiatric/Behavioral: Positive for depression and memory loss (sometimes). The patient is nervous/anxious and has insomnia.     Physical Exam  Constitutional: She appears well-developed and well-nourished. No distress.   HENT:   Head: Normocephalic and atraumatic.   Eyes: Conjunctivae are normal. Pupils are equal, round, and reactive to light.   Neck: Normal range of motion.   Cardiovascular: Normal rate.   Pulmonary/Chest: Effort normal.   Musculoskeletal: She exhibits tenderness (bilateral lumbar/SIJ; also  right knee). She exhibits no edema.   Neurological: She is alert.   Skin: Skin is warm and dry. No rash noted.   Psychiatric: She has a normal mood and affect. Her behavior is normal. Judgment and thought content normal.       ASSESSMENT and PLAN  1. Pain in joint, multiple sites    2. Neuralgia, neuritis, and radiculitis, unspecified    3. Neuropathy in diabetes    4. Rib pain    5. Lupus    6. Osteoarthrosis, unspecified whether generalized or localized, unspecified site    7. Myalgia and myositis, unspecified    8. Type II or unspecified type diabetes mellitus without mention of complication, not stated as uncontrolled    9. Arthritis of knee    10. Encounter for long-term (current) use of other medications    11. Back pain, lumbosacral      the following changes in treatment are made: See patient instructions for details of treatment plan.  very strongly urged to quit smoking to reduce cardiovascular risk  reviewed medications and side effects in detail

## 2012-01-20 NOTE — ED Provider Notes (Signed)
I was personally available for consultation in the emergency department.  I have reviewed the chart and agree with the documentation recorded by the MLP, including the assessment, treatment plan, and disposition prior to patient being discharged.  Anju Sereno L Ellinore Merced, MD

## 2012-01-20 NOTE — ED Provider Notes (Signed)
Patient is a 61 y.o. female presenting with toe pain.   Toe Pain      HPI- 61 YO female presents with a complaint of right second toe pain. The patient describes a injury a year ago when she fell and landed on her tail bone. The patient claims that a few days ago she twisted the toe again. The patient describes no other complaints at this time.     Past Medical History   Diagnosis Date   ??? Seizure    ??? Diabetes mellitus    ??? Stroke    ??? DVT (deep venous thrombosis)    ??? Adjustment disorder with mixed anxiety and depressed mood    ??? Aneurysm of internal carotid artery    ??? Backache    ??? Cervicalgia    ??? Chronic pain syndrome    ??? Coccydynia    ??? Fibromyalgia    ??? Generalized osteoarthritis of multiple sites    ??? Hemiparesis, right    ??? Hyperlipidemia    ??? Hypertension    ??? Irregular sleep-wake rhythm    ??? Knee joint pain    ??? Lumbar spondylosis    ??? Muscle spasm    ??? Osteoporosis    ??? Pulmonary embolism    ??? Subclinical hypothyroidism         No past surgical history on file.      No family history on file.     History     Social History   ??? Marital Status: SINGLE     Spouse Name: N/A     Number of Children: N/A   ??? Years of Education: N/A     Occupational History   ??? Not on file.     Social History Main Topics   ??? Smoking status: Former Smoker   ??? Smokeless tobacco: Never Used   ??? Alcohol Use: No   ??? Drug Use: No   ??? Sexually Active:      Other Topics Concern   ??? Not on file     Social History Narrative   ??? No narrative on file                  ALLERGIES: Aspirin; Opana; and Tylenol      Review of Systems   Constitutional: Negative.    HENT: Negative.    Eyes: Negative.    Respiratory: Negative.    Cardiovascular: Negative.    Gastrointestinal: Negative.    Genitourinary: Negative.    Musculoskeletal: Negative.    Neurological: Negative.        Filed Vitals:    01/20/12 0837   BP: 114/75   Pulse: 83   Temp: 97.9 ??F (36.6 ??C)   Resp: 18   SpO2: 100%            Physical Exam   Constitutional: She is oriented to  person, place, and time. She appears well-developed and well-nourished.   HENT:   Head: Normocephalic and atraumatic.   Eyes: EOM are normal. Pupils are equal, round, and reactive to light.   Neck: Normal range of motion. Neck supple.   Cardiovascular: Normal rate and regular rhythm.    Pulmonary/Chest: Effort normal and breath sounds normal.   Abdominal: Soft. Bowel sounds are normal.   Musculoskeletal: Normal range of motion.        The patient has a hammer like appearing toe that has no swelling, or ecchymosis. It is tender to palpation. Cap refill is less than  2 seconds.    Neurological: She is alert and oriented to person, place, and time.   Skin: Skin is warm and dry.   Psychiatric: She has a normal mood and affect. Her behavior is normal. Thought content normal.        MDM    Procedures    Labs Reviewed - No data to display     No results found for this or any previous visit (from the past 12 hour(s)).     9:29 AM  Patients results have been reviewed with them.  Patient and/or family have verbally conveyed their understanding and agreement of the patient's signs, symptoms, diagnosis, treatment and prognosis and additionally agree to follow up as recommended or return to the Emergency Room should their condition change prior to their follow-up appointment. Patient verbally agrees with the care-plan and verbally conveys that all of their questions have been answered.   Discharge instructions have also been provided to the patient with some educational information regarding their diagnosis as well a list of reasons why they would want to return to the ER prior to their follow-up appointment should their condition change.     CLINICAL IMPRESSION    1. Foot pain

## 2012-01-20 NOTE — ED Notes (Signed)
I have reviewed discharge instructions with the patient.  The patient verbalized understanding.    Patient armband removed and given to patient to take home.  Patient was informed of the privacy risks if armband lost or stolen    The patient [name] [name] is a 61 y.o. female who  has a past medical history of Seizure; Diabetes mellitus; Stroke; DVT (deep venous thrombosis); Adjustment disorder with mixed anxiety and depressed mood; Aneurysm of internal carotid artery; Backache; Cervicalgia; Chronic pain syndrome; Coccydynia; Fibromyalgia; Generalized osteoarthritis of multiple sites; Hemiparesis, right; Hyperlipidemia; Hypertension; Irregular sleep-wake rhythm; Knee joint pain; Lumbar spondylosis; Muscle spasm; Osteoporosis; Pulmonary embolism; and Subclinical hypothyroidism..  She   The patient's medications were reviewed and discussed prior to discharge. The patient was very interactive and did understand their medications.    The following medications were discussed in details with the patient. The [facility] said [facility].      [email or web address] @    Kiylah Loyer Benay Pike, RN    MyChart Activation    Thank you for requesting access to MyChart. Please follow the instructions below to securely access and download your online medical record. MyChart allows you to send messages to your doctor, view your test results, renew your prescriptions, schedule appointments, and more.    How Do I Sign Up?    1. In your internet browser, go to [email or web address]  2. Click on the First Time User? Click Here link in the Sign In box. You will be redirect to the New Member Sign Up page.  3. Enter your MyChart Access Code exactly as it appears below. You will not need to use this code after you???ve completed the sign-up process. If you do not sign up before the expiration date, you must request a new code.    MyChart Access Code: @[email or web address] @ (This is the date your MyChart access code will  expire)    4. Enter the last four digits of your Social Security Number (xxxx) and Date of Birth (mm/dd/yyyy) as indicated and click Submit. You will be taken to the next sign-up page.  5. Create a MyChart ID. This will be your MyChart login ID and cannot be changed, so think of one that is secure and easy to remember.  6. Create a MyChart password. You can change your password at any time.  7. Enter your Password Reset Question and Answer. This can be used at a later time if you forget your password.   8. Enter your e-mail address. You will receive e-mail notification when new information is available in MyChart.  9. Click Sign Up. You can now view and download portions of your medical record.  10. Click the Download Summary menu link to download a portable copy of your medical information.    Additional Information    If you have questions, please visit the Frequently Asked Questions section of the MyChart website at [email or web address] Remember, MyChart is NOT to be used for urgent needs. For medical emergencies, dial 911.

## 2012-01-20 NOTE — ED Notes (Signed)
Patient states injury was a year ago to toe. Crying and rocking with pain. Refuses tylenol, stating she breaks out in rash from tylenol. Reminded patient she takes Percocet which has tylenol in it,to which she replied that is different tylenol.

## 2012-01-20 NOTE — ED Notes (Signed)
Pain on R toe pain, pt is on pain management.

## 2012-03-29 LAB — AMB POC AVEE DRUG SCREEN
BENZODIAZEPINES UR POC: POSITIVE
OPIATES UR POC: POSITIVE
OXYCODONE UR POC: POSITIVE

## 2012-03-29 MED ORDER — ZOLPIDEM 5 MG TAB
5 mg | ORAL_TABLET | Freq: Every evening | ORAL | Status: DC | PRN
Start: 2012-03-29 — End: 2013-06-15

## 2012-03-29 MED ORDER — OXYCODONE-ACETAMINOPHEN 10 MG-325 MG TAB
10-325 mg | ORAL_TABLET | Freq: Four times a day (QID) | ORAL | Status: DC
Start: 2012-03-29 — End: 2012-03-29

## 2012-03-29 MED ORDER — OXYCODONE-ACETAMINOPHEN 10 MG-325 MG TAB
10-325 mg | ORAL_TABLET | Freq: Four times a day (QID) | ORAL | Status: DC
Start: 2012-03-29 — End: 2014-11-26

## 2012-03-29 MED ORDER — ALPRAZOLAM 0.5 MG TAB
0.5 mg | ORAL_TABLET | Freq: Two times a day (BID) | ORAL | Status: DC | PRN
Start: 2012-03-29 — End: 2014-11-26

## 2012-03-29 MED ORDER — AMITRIPTYLINE 75 MG TAB
75 mg | ORAL_TABLET | Freq: Every evening | ORAL | Status: DC
Start: 2012-03-29 — End: 2016-07-14

## 2012-03-29 MED ORDER — CARISOPRODOL 350 MG TAB
350 mg | ORAL_TABLET | Freq: Three times a day (TID) | ORAL | Status: DC | PRN
Start: 2012-03-29 — End: 2013-06-15

## 2012-03-29 NOTE — Progress Notes (Signed)
Patient states she needs refills on   Soma  Ambien  Xanax  Percocet  Amitriptyline  NO MEDICATIONS WITH HER.

## 2012-03-29 NOTE — Progress Notes (Signed)
HISTORY OF PRESENT ILLNESS  HPI  [name] [name] is a 61 y.o. female. Patient presents for follow up of chronic widespread joint pain due to osteoarthritis, neck pain due to cervical DDD, and burning leg pain due to diabetic neuropathy. 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   She gets up to 80% pain relief with her current regimen and she rates her pain today at 9/10 yesterday. She denies constipation issues, and takes Elavil and Ambien for sleep. She also takes a Herbalist at bedtime as well.  She has a left foot bunion that is causing some pain as well. Her main pain generators are the lumbar/sacral pain and right knee pain.   Today she relates having recently gone to ED for severely elevated glucose (greater than 550)and was admitted at that time.  She also says she was taken off all diabetic medications by her PCP "because I was going going too low".  She says she has trouble "eating the way they told me, with a lot of little meals, and when I see my sugar is going high I go into the kitchen and cram as much white bread into my mouth as I can".    She also becomes tearful when discussing continuing coccyx pain, and wonders "is this because of my diabetes, I don't heal well?"  She reports that she is getting home-based PT currently related to her stroke.  ROS  Constitutional: Positive for malaise/fatigue.   HENT: Positive for congestion and neck pain.   Eyes: Negative.   Respiratory: Positive for cough (COPD).   Gastrointestinal: Positive for heartburn.  Genitourinary: Positive for frequency.   Musculoskeletal: Positive for myalgias, back pain and joint pain.   Neurological: Positive for sensory change, focal weakness and headaches.   Endo/Heme/Allergies: Positive for easy bruising.   Psychiatric/Behavioral: Positive for depression and memory loss (sometimes). The patient is nervous/anxious and has insomnia.     Physical Exam  Constitutional: She appears well-developed and well-nourished. Teary at times  throughout visit.  HENT:   Head: Normocephalic and atraumatic.   Eyes: Conjunctivae are normal. Pupils are equal, round, and reactive to light.   Neck: Normal range of motion.   Cardiovascular: Normal rate.   Pulmonary/Chest: Effort normal.   Musculoskeletal: She exhibits tenderness (bilateral lumbar/SIJ; also right knee). She exhibits no edema.   Neurological: She is alert.   Skin: Skin is warm and dry. No rash noted.   Psychiatric: She has a normal mood and affect. Her behavior is normal. Judgment and thought content normal    ASSESSMENT and PLAN  1. Pain in joint, multiple sites  AVEE DRUG SCREEN, AMB POC [facility]EE DRUG SCREEN   2. Neuralgia, neuritis, and radiculitis, unspecified  AVEE DRUG SCREEN, AMB POC [facility]EE DRUG SCREEN   3. Neuropathy in diabetes  AVEE DRUG SCREEN, AMB POC AVEE DRUG SCREEN   4. Lupus  AVEE DRUG SCREEN, AMB POC AVEE DRUG SCREEN   5. Osteoarthrosis, unspecified whether generalized or localized, unspecified site  AVEE DRUG SCREEN, AMB POC AVEE DRUG SCREEN   6. Myalgia and myositis, unspecified  AVEE DRUG SCREEN, AMB POC AVEE DRUG SCREEN   7. Pain in limb  AVEE DRUG SCREEN, AMB POC AVEE DRUG SCREEN   8. Type II or unspecified type diabetes mellitus without mention of complication, not stated as uncontrolled  AVEE DRUG SCREEN, AMB POC AVEE DRUG SCREEN   9. Arthritis of knee  AVEE DRUG SCREEN, AMB POC AVEE DRUG SCREEN   10. Encounter for long-term (  current) use of other medications  AVEE DRUG SCREEN, AMB POC AVEE DRUG SCREEN   11. Sensory ataxia  AVEE DRUG SCREEN, AMB POC AVEE DRUG SCREEN   12. Back pain, lumbosacral  AVEE DRUG SCREEN, AMB POC AVEE DRUG SCREEN     current treatment plan is effective, no change in therapy  reviewed diet, exercise and weight control  reviewed medications and side effects in detail  specific diabetic recommendations: referral to Diabetic Education department and home glucose monitoring emphasized

## 2012-03-29 NOTE — Patient Instructions (Addendum)
1. No changes are needed in the pain medicine regimen, you are stable and compliant with medications, and getting good pain relief.  ?? Today is the second time you did not bring your medicine bottles with you, and you say you ran out of the percocet four days ago. This cannot be permitted, you will receive a warning letter about this.  ?? Bring in your bottles to each visit, and do not run out of your medications early. If your urine is negative for your medicines or otherwise not consistent with medications we prescribe, you may not receive medication Rx at next visit.    2. Your diabetes is very poorly controlled, and you have no idea how to eat properly. You are willing to check your blood sugar frequently, which will help you gain control.  ?? Your daughter goes to [facility]-- please let her help you get an appt there for evaluation. They will teach you everything you need to know about correct way to eat.  ?? They will figure out what type of medication will work best for you.    3. Return in 8 weeks.

## 2012-03-29 NOTE — Progress Notes (Signed)
POC UDS done in office today the pt was(+)OPI,BZO,OXY for. Provider reviewed.

## 2012-05-05 NOTE — Telephone Encounter (Signed)
Message left 10:05 for patient

## 2012-05-05 NOTE — Telephone Encounter (Signed)
RANDOM PILL COUNT AND DRUG SCREEN  Patient was called  @ 10.00 AM to bring in pills and do a drug screen and has 4 hours or patient will be up for  discharge from practice.  Pill Count:  as follows:

## 2012-05-05 NOTE — Telephone Encounter (Signed)
Patient didn't come in for Random testing patient was unreachable with 1 contact number.Provider was advised and further chart review.

## 2012-05-05 NOTE — Telephone Encounter (Signed)
3 messages left for patient to advise random drug screen needed.

## 2012-05-24 ENCOUNTER — Encounter

## 2012-05-24 MED ORDER — HYDROXYZINE 25 MG TAB
25 mg | ORAL_TABLET | ORAL | Status: DC
Start: 2012-05-24 — End: 2014-11-26

## 2012-05-24 MED ORDER — PROMETHAZINE 25 MG TAB
25 mg | ORAL_TABLET | Freq: Four times a day (QID) | ORAL | Status: DC | PRN
Start: 2012-05-24 — End: 2014-11-26

## 2012-05-24 MED ORDER — LORAZEPAM 0.5 MG TAB
0.5 mg | ORAL_TABLET | Freq: Four times a day (QID) | ORAL | Status: DC
Start: 2012-05-24 — End: 2014-11-26

## 2012-05-24 MED ORDER — CLONIDINE 0.1 MG TAB
0.1 mg | ORAL_TABLET | ORAL | Status: DC
Start: 2012-05-24 — End: 2013-06-15

## 2012-05-24 NOTE — Telephone Encounter (Signed)
Verbal order from provider [place] patient [name] [name] was discharged and a withdrawal cocktail is offered and patient will pick up at pharmacy. Medication for withdrawal cocktail Ativan 0.5mg  # no refill 1-2 tablets every 6 hours  PRN for agitation                                                                              Atarax 25 mg # 9 no refill, 1 tablet BID for 3 days                                                                            Catapres  0.1 mg # 14 no refill, 1 tablet BID for 7 days                                                                           Phenergan 25 mg # 12 no refills, 1 tablet every 8 hours for Nausea   [facility] pharmacy was called  [phone]

## 2012-06-01 NOTE — Telephone Encounter (Signed)
[name] [name] called stating that she needed medication refills until seeing another pain management provider. She was advised that she was no longer a patient within our practice and would be accommodated with a withdrawal regimen, if needed. She declined and the call was concluded.

## 2012-06-10 NOTE — Telephone Encounter (Signed)
Patient requesting records for new doctor informed patient need release form sign to send records, patient will go to [place] office to sign release and have them fax to me at [facility] office.

## 2013-06-15 ENCOUNTER — Inpatient Hospital Stay: Payer: MEDICAID

## 2013-06-15 LAB — PROTHROMBIN TIME + INR
INR: 1.6 — ABNORMAL HIGH (ref 0.8–1.2)
Prothrombin time: 18.5 s — ABNORMAL HIGH (ref 11.5–15.2)

## 2013-06-15 LAB — GLUCOSE, POC: Glucose (POC): 77 mg/dL (ref 70–110)

## 2013-06-15 MED ORDER — LACTATED RINGERS IV
INTRAVENOUS | Status: DC
Start: 2013-06-15 — End: 2013-06-15

## 2013-06-15 MED ORDER — BALANCED SALT IRRIG SOLN COMB2 INTRAOCULAR
INTRAOCULAR | Status: DC | PRN
Start: 2013-06-15 — End: 2013-06-15
  Administered 2013-06-15: 19:00:00 via OPHTHALMIC

## 2013-06-15 MED ORDER — BUPIVACAINE (PF) 0.75 % (7.5 MG/ML) IJ SOLN
0.75 % (7.5 mg/mL) | INTRAMUSCULAR | Status: AC
Start: 2013-06-15 — End: ?

## 2013-06-15 MED ORDER — TETRACAINE HCL (PF) 0.5 % EYE DROPS
0.5 % | OPHTHALMIC | Status: AC
Start: 2013-06-15 — End: ?

## 2013-06-15 MED ORDER — PHENYLEPHRINE 2.5 % EYE DROPS
2.5 % | OPHTHALMIC | Status: AC
Start: 2013-06-15 — End: 2013-06-15
  Administered 2013-06-15 (×3): via OPHTHALMIC

## 2013-06-15 MED ORDER — WATER FOR INJECTION, STERILE INJECTION
INTRAMUSCULAR | Status: AC
Start: 2013-06-15 — End: ?

## 2013-06-15 MED ORDER — HYDROMORPHONE 2 MG/ML INJECTION SOLUTION
2 mg/mL | INTRAMUSCULAR | Status: DC
Start: 2013-06-15 — End: 2013-06-15

## 2013-06-15 MED ORDER — DEXAMETHASONE SODIUM PHOSPHATE 4 MG/ML IJ SOLN
4 mg/mL | INTRAMUSCULAR | Status: AC
Start: 2013-06-15 — End: ?

## 2013-06-15 MED ORDER — HYDROMORPHONE 2 MG/ML INJECTION SOLUTION
2 mg/mL | Freq: Once | INTRAMUSCULAR | Status: DC
Start: 2013-06-15 — End: 2013-06-15

## 2013-06-15 MED ORDER — CEFAZOLIN 1 GRAM SOLUTION FOR INJECTION
1 gram | INTRAMUSCULAR | Status: AC
Start: 2013-06-15 — End: ?

## 2013-06-15 MED ORDER — MIDAZOLAM 1 MG/ML IJ SOLN
1 mg/mL | INTRAMUSCULAR | Status: AC
Start: 2013-06-15 — End: ?

## 2013-06-15 MED ORDER — ATROPINE 1 % EYE DROPS
1 % | OPHTHALMIC | Status: DC | PRN
Start: 2013-06-15 — End: 2013-06-15
  Administered 2013-06-15: 20:00:00 via OPHTHALMIC

## 2013-06-15 MED ORDER — CHONDROITIN-SOD HYALURON 4 %-3 % (40 MG-30 MG/ML) INTRAOCULAR SYRNG
4-3 % (40-30 mg/mL) | INTRAOCULAR | Status: AC
Start: 2013-06-15 — End: ?

## 2013-06-15 MED ORDER — FAMOTIDINE 20 MG TAB
20 mg | Freq: Once | ORAL | Status: AC
Start: 2013-06-15 — End: 2013-06-15
  Administered 2013-06-15: 18:00:00 via ORAL

## 2013-06-15 MED ORDER — NEOMYCIN-POLYMYXIN-DEXAMETH 3.5 MG-10,000 UNIT/G-0.1 % EYE OINTMENT
3.5 mg/g-10,000 unit/g-0.1 % | OPHTHALMIC | Status: AC
Start: 2013-06-15 — End: ?

## 2013-06-15 MED ORDER — CHONDROITIN-SOD HYALURON 4 %-3 % (40 MG-30 MG/ML) INTRAOCULAR SYRNG
4-3 % (40-30 mg/mL) | INTRAOCULAR | Status: DC | PRN
Start: 2013-06-15 — End: 2013-06-15
  Administered 2013-06-15: 19:00:00 via OPHTHALMIC

## 2013-06-15 MED ORDER — BALANCED SALT IRRIG SOLN COMB2 INTRAOCULAR
INTRAOCULAR | Status: DC | PRN
Start: 2013-06-15 — End: 2013-06-15
  Administered 2013-06-15: 19:00:00

## 2013-06-15 MED ORDER — PROPARACAINE 0.5 % EYE DROPS
0.5 % | OPHTHALMIC | Status: AC
Start: 2013-06-15 — End: 2013-06-15
  Administered 2013-06-15 (×3): via OPHTHALMIC

## 2013-06-15 MED ORDER — BUPIVACAINE (PF) 0.75 % (7.5 MG/ML) IJ SOLN
0.757.5 % (7.5 mg/mL) | Freq: Once | INTRAMUSCULAR | Status: AC
Start: 2013-06-15 — End: 2013-06-15
  Administered 2013-06-15: 19:00:00 via INTRAOCULAR

## 2013-06-15 MED ORDER — BUPIVACAINE (PF) 0.75 % (7.5 MG/ML) IJ SOLN
0.75 % (7.5 mg/mL) | INTRAMUSCULAR | Status: DC | PRN
Start: 2013-06-15 — End: 2013-06-15
  Administered 2013-06-15: 19:00:00

## 2013-06-15 MED ORDER — CYCLOPENTOLATE 1 % EYE DROPS
1 % | OPHTHALMIC | Status: AC
Start: 2013-06-15 — End: 2013-06-15
  Administered 2013-06-15 (×3): via OPHTHALMIC

## 2013-06-15 MED ORDER — NEOMYCIN-POLYMYXIN-DEXAMETH 3.5 MG-10,000 UNIT/G-0.1 % EYE OINTMENT
3.5 mg/g-10,000 unit/g-0.1 % | OPHTHALMIC | Status: DC | PRN
Start: 2013-06-15 — End: 2013-06-15
  Administered 2013-06-15: 20:00:00 via OPHTHALMIC

## 2013-06-15 MED ORDER — CEFAZOLIN 1 GRAM SOLUTION FOR INJECTION
1 gram | INTRAMUSCULAR | Status: DC | PRN
Start: 2013-06-15 — End: 2013-06-15
  Administered 2013-06-15: 20:00:00 via INTRAVENOUS

## 2013-06-15 MED ORDER — DEXTROSE 5%-LACTATED RINGERS IV
INTRAVENOUS | Status: DC
Start: 2013-06-15 — End: 2013-06-15
  Administered 2013-06-15: 18:00:00 via INTRAVENOUS

## 2013-06-15 MED ORDER — DEXAMETHASONE SODIUM PHOSPHATE 4 MG/ML IJ SOLN
4 mg/mL | INTRAMUSCULAR | Status: DC | PRN
Start: 2013-06-15 — End: 2013-06-15
  Administered 2013-06-15: 20:00:00 via INTRAVITREAL

## 2013-06-15 MED ORDER — TRIAMCINOLONE ACETONIDE (PF) 40 MG/ML INTRAOCULAR SUSP
40 mg/mL | INTRAOCULAR | Status: AC
Start: 2013-06-15 — End: ?

## 2013-06-15 NOTE — Progress Notes (Signed)
No interval change in h and p

## 2013-06-15 NOTE — Brief Op Note (Signed)
BRIEF OPERATIVE NOTE    Date of Procedure: 06/15/2013   Preoperative Diagnosis: VITREOUS OPACITIES LEFT EYE  Postoperative Diagnosis: VITREOUS OPACITIES LEFT EYE    Procedure(s):  VITRECTOMY POSTERIOR 25 GAUGE with air exchange left eye  Surgeon(s) and Role:     * Vernell Morgans, MD - Primary  Anesthesia: MAC   Estimated Blood Loss: min  Specimens: * No specimens in log *   Findings: vit opac   Complications: none  Implants: * No implants in log *

## 2013-06-15 NOTE — Other (Signed)
Pt verbalized zero pain after the dialudid 1mg  iv was given as ordered by Dr. Annamary Rummage. Pt was discharged.

## 2013-07-22 NOTE — Op Note (Signed)
[facility]                  [address], [place], [place]  [address]                                 OPERATIVE REPORT    PATIENT:     [name] [name], [name] [name]  MRN              [id number]   DATE:       06/15/2013  BILLING:         [id number]  LOCATION:  ATTENDING:   Alishba Naples Conley Simmonds, MD  SURGEON:     Carvin Almas Conley Simmonds, MD      PREOPERATIVE DIAGNOSES  1. Vitreous detachment.  2. 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    POSTOPERATIVE DIAGNOSES:    PROCEDURES PERFORMED: Pars plana vitrectomy, left eye.    SURGEON: Vernell Morgans, MD    ASSISTANT: None.    SPECIMENS REMOVED: none    COMPLICATIONS: None.    ESTIMATED BLOOD LOSS: Less than 1 mL.    ANESTHESIA: Local with sedation.    DESCRIPTION OF PROCEDURE: After understanding the risks, benefits, and  alternatives to surgery, the patient was brought to the operating room in  stable condition. The left eye was blocked using 2% lidocaine and 0.75%  Marcaine in a retrobulbar fashion. The left eye was prepped and draped in  the usual sterile fashion for ocular surgery. An Alcon 25-gauge vitrectomy  port was placed inferotemporal. The infusion cannula was turned on and  inserted through this port. Similarly, ports were placed supratemporally  and supranasally. The microvitrectomy instrument and light pipe were  introduced and were used to perform vitrectomy. After vitrectomy, a  fluid/air exchange was performed. Ports were removed. Intraocular pressure  was normal. Subconjunctival dexamethasone and Ancef were instilled.  Maxitrol, atropine, a patch and shield were placed on the eye.                      Miyuki Rzasa Conley Simmonds, MD    DMS:wmx  D: 07/22/2013 02:18 P T: 07/22/2013 04:30 P  Job#:  [id number]  CScriptDoc #:  [id number]  cc:   Reuel Boom A. Laverle Patter, M.D.        Shanieka Blea Conley Simmonds, MD

## 2013-07-22 NOTE — Op Note (Signed)
[facility]                  [address], [place], [place]  [address]                                 OPERATIVE REPORT    PATIENT:     [name], [name] W  MRN              [id number]   DATE:       06/15/2013  BILLING:         [id number]  LOCATION:  ATTENDING:   Chevon Laufer M Jonita Hirota, MD  SURGEON:     Omah Dewalt M Laprecious Austill, MD      PREOPERATIVE DIAGNOSES  1. Vitreous detachment.  2. 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    POSTOPERATIVE DIAGNOSES:    PROCEDURES PERFORMED: Pars plana vitrectomy, left eye.    SURGEON: Reagen Goates M Gelisa Tieken, MD    ASSISTANT: None.    SPECIMENS REMOVED: none    COMPLICATIONS: None.    ESTIMATED BLOOD LOSS: Less than 1 mL.    ANESTHESIA: Local with sedation.    DESCRIPTION OF PROCEDURE: After understanding the risks, benefits, and  alternatives to surgery, the patient was brought to the operating room in  stable condition. The left eye was blocked using 2% lidocaine and 0.75%  Marcaine in a retrobulbar fashion. The left eye was prepped and draped in  the usual sterile fashion for ocular surgery. An Alcon 25-gauge vitrectomy  port was placed inferotemporal. The infusion cannula was turned on and  inserted through this port. Similarly, ports were placed supratemporally  and supranasally. The microvitrectomy instrument and light pipe were  introduced and were used to perform vitrectomy. After vitrectomy, a  fluid/air exchange was performed. Ports were removed. Intraocular pressure  was normal. Subconjunctival dexamethasone and Ancef were instilled.  Maxitrol, atropine, a patch and shield were placed on the eye.                      Nyaisha Simao M Amora Sheehy, MD    DMS:wmx  D: 07/22/2013 02:18 P T: 07/22/2013 04:30 P  Job#:  [id number]  CScriptDoc #:  [id number]  cc:   Daniel A. Bluestein, M.D.        Tyresha Fede M Ruthell Feigenbaum, MD

## 2013-11-05 NOTE — Consults (Signed)
[facility]  [facility]  NAME:  [name], [name]  SEX:   F  ADMIT: 11/05/2013  DATE OF CONSULT: 11/05/2013  REFERRING PHYSICIAN:    DOB: 06-04-1951  MR#    [id number]  ROOM:  CD14  ACCT#  [id number]        DATE OF CONSULTATION:   11/05/2013    REASON FOR CONSULTATION:  Chest pain.    HISTORY OF PRESENT ILLNESS:  The patient is a very pleasant 63 year old female with a history of   established coronary disease with prior percutaneous interventions by [facility]   T[facility]aldGotti [facility] in 2009 with no further cardiology followup.  She [facility]   [facility] throughout the years.  She has a history   of stroke as well as a history of anemia and a history of fibromyalgia.  Two   days ago she began having chest pain that was atypical for angina in some   respects and typical in others.  This was typical in the sense that she had   associated chest pressure radiating to the base of the neck; however, she has   her superimposed tenderness to palpation and some radiation to the right arm.    She initially did not want to come to the hospital, but with the chest   pressure, took some nitroglycerin and came to the ER.  She had no EKG changes   and has had negative enzymes so far.  She was admitted to short stay and I was   asked to see her in consultation.      Upon my interview, she is comfortable, pain free and has no further   cardiopulmonary symptoms, somewhat anxious.  She denies any heart failure   symptoms including orthopnea, PND or lower extremity edema.  Denies any   syncope or near syncope.    PAST MEDICAL HISTORY:  Anemia, fibromyalgia, hypertension, diabetes, history of pancreatitis, history   of stroke, history of anxiety.    FAMILY HISTORY:  The patient lives with her daughter and 2 grandchildren and is independent,   retired.  Prior home health aide.    SOCIAL HISTORY:  The patient quit tobacco use years ago, former half a pack of cigarettes user.     No alcohol use, no drug use.    FAMILY HISTORY:  No premature coronary artery disease in first-degree relatives.    REVIEW OF SYSTEMS:  GENERAL:  No fevers, no chills, no weight loss.  HEENT:  No epistaxis, no rhinorrhea.  LUNGS:  No cough, no sputum production, no hemoptysis.  SKIN:  No rashes.  GASTROINTESTINAL:  No hematochezia, no melena.  GENITOURINARY:  No hematuria, no dysuria.    LABORATORY DATA:  No evidence of elevated cardiac enzymes and CBC is revealing a hematocrit of   33 with a potassium of 3.9, AST 28, lipase is 95.      DIAGNOSTIC STUDIES:   Chest x-ray shows no acute pulmonary process.    OVERALL IMPRESSION:  A very pleasant 63 year old female with known coronary disease and chest pain   with some typical and atypical features.  It does not appear to be unstable   angina.  Given the constellation of clinical symptoms I think that a   noninvasive strategy is best pursued as an initial strategy rather than an   invasive strategy.  We will conduct echocardiography and pharmacologic nuclear   stress testing and if this is reassuring we will plan on following her  as an   outpatient.  Certainly if she has significant abnormalities noninvasively then   we may change the plan to an invasive strategy or should she have elevation   of her cardiac enzymes or other high risk features which is not the case at   the present time.  I have reviewed the plan with her and she is in agreement   to proceed.  We will continue her on aspirin and nitro paste for now as well   as a statin.  I appreciate the opportunity to participate in her care.      ___________________  Osa Craver MD  Dictated By:.   LB  D:11/05/2013 17:29:06  T: 11/05/2013 18:05:14  [id number]  Authenticated by Osa Craver, M.D. On 11/11/2013 12:56:27 PM

## 2013-11-06 NOTE — ED Provider Notes (Signed)
[facility]  EMERGENCY DEPARTMENT TREATMENT REPORT  NAME:  [name] [name], [name] [name]  SEX:   F  ADMIT: 11/05/2013  DOB:   Oct 30, 1950  MR#    [id number]  ROOM:  CD14  TIME DICTATED: 05 24 PM  ACCT#  [id number]        I hereby certify this patient for admission, based on medical necessity, as   noted below.     CHIEF COMPLAINT:   Chest pain.    HISTORY OF PRESENT ILLNESS:  The patient is a 63 year old female who has had a long history of coronary   artery disease and multiple stents, started having substernal chest pain   similar to previous heart pain a couple of hours prior to her arrival. Aspirin   given by EMS. Nitroglycerin given with minimal decrease in her pain. Still   has continuing symptoms. Family at the bedside. Denies any specific triggers.   The patient said it just started spontaneously when she was resting. The pain   is across the chest, feels tight, radiates maybe to her shoulders somewhat. No   change with twisting, turning, breathing or moving.      ALLERGIES:  NONE.    MEDICATIONS:  Lamictal, gabapentin, Flexeril, oxycodone and some other medicines she might   take for blood pressure.     SOCIAL HISTORY:   Does not smoke or drink.     PAST MEDICAL HISTORY:  Coronary artery disease, diabetes, myocardial infarction with stent placement   by Dr. Kristen Cardinal. History of fibromyalgia, pancreatitis listed in addition   to hypertension.    FAMILY HISTORY:   No one else is ill today.    REVIEW OF SYSTEMS:  No fever.  No cough, congestion or upper respiratory symptoms. No nausea,   vomiting or diarrhea.  No leg pain or swelling. No trauma. No rash. No   dizziness,  lightheadedness, vertigo.  No trouble hearing or seeing, no   confusion. No depression or suicidal thoughts, according to family.   Denies complaints in all other systems.     PHYSICAL EXAMINATION:  VITAL SIGNS: BP 127/82, pulse 93, respirations 18, temperature 98.2, says her    pain is 10 out of 10. She is holding her chest, very uncomfortable.    Saturation 100%.  NECK:  Supple, nontender, symmetrical, no masses or JVD, trachea midline,   thyroid not enlarged, nodular, or tender.  RESPIRATORY:  Clear and equal breath sounds.  No respiratory distress,   tachypnea, or accessory muscle use.   CARDIOVASCULAR:   Heart regular, without murmurs, gallops, rubs, or thrills.   CHEST:  Chest symmetrical without masses or tenderness.  GI:  Abdomen soft, nontender, without complaint of pain to palpation.  No   hepatomegaly or splenomegaly. No abdominal or inguinal masses appreciated by   inspection or palpation.   NEUROLOGIC:  Alert, oriented.  Sensation intact, motor strength equal and   symmetric.  Vascular:  Calves soft and nontender.  No peripheral edema or significant   varicosities.  Carotid, femoral, and pedal pulses are satisfactory.  The   abdominal aorta is not palpably enlarged.   PSYCHIATRIC: Very  uncomfortable appearing, appears very anxious. Crying at   times. She is alert and oriented, follows commands well, fair historian.    INITIAL ASSESSMENT AND MANAGEMENT PLAN:   The patient presents here with having chest pain, will be evaluated further,   treated symptomatically. Nursing notes were reviewed. Old records reviewed.    Coronary disease certainly high  risk.     LABORATORY TESTS:  White count 5.9, hemoglobin and hematocrit of 33 and 10, normal electrolytes.   Normal lipase, cardiac enzymes. Chest x-ray: No acute process. The patient's   EKG showed no acute ischemic changes.     COURSE IN THE EMERGENCY DEPARTMENT:   The patient was seen here, medicated for her discomfort with morphine in   repeated doses, received reflux medicine also and nitro paste. Symptoms   gradually improved, required another dose of morphine and was more   comfortable. She was admitted to Dr. Tristan SchroederStokes who came to see the patient and   wrote further orders. The patient was placed upstairs on short stay telemetry.     She had no other new complaints during her stay. Monitor remained regular.     CLINICAL IMPRESSION:   1. Acute precordial chest pain.  2. History significant for coronary artery disease and other medical problems   including fibromyalgia, as noted above.      ___________________  Smitty CordsErik H Ova Gillentine MD  Dictated By: Marland Kitchen.     My signature above authenticates this document and my orders, the final  diagnosis (es), discharge prescription (s), and instructions in the [facility]   Pulsecheck record.  Nursing notes have been reviewed by the physician/mid-level provider.    If you have any questions please contact [phone].    PB  D:11/05/2013 17:24:11  T: 11/06/2013 01:18:46  [id number]  Authenticated by Smitty CordsErik H. Ambrie Carte, M.D. On 11/06/2013 06:16:39 AM
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
## 2014-02-20 NOTE — ED Provider Notes (Signed)
[facility]  EMERGENCY [facility]PARTMENT TREATMENT REPORT  NAME:  [name], [name]  SEX:   F  ADMIT: 02/19/2014  DOB:   [birth year]  MR#    [id number]  ROOM:    TIME DICTATED: 08 52 PM  ACCT#  [id number]    cc: Guss BundeRACEY PENNINGTON M.D., Lemmie EvensWILLIAM GRANT DPM    TIME OF EVALUATION:  2030.    PRIMARY CARE PHYSICIAN:  Dr. Kennedy BuckerGrant    PAIN MANAGEMENT:  Dr. Buford DresserPennington    CHIEF COMPLAINT:   Left foot pain.    HISTORY OF PRESENT ILLNESS:  This is a 63 year old female who apparently fractured a toe about a year ago  when something fell on her foot about an hour ago, she has been experiencing  intense sharp, throbbing pain in the third toe on the left foot.  She denies  having any trauma, however, not exactly sure what caused it.  She tells me,  again, it is a 10 out of 10 pain right now, throbbing with a sharp component.  It hurts much more when being touched or when she is trying to bear weight and  less when not.  She does take oxycodone for chronic pain with lupus and  fibromyalgia; she took 1 about 3:30 for that but states it is not helping this  toe pain at all.  She, otherwise, denies any fever, cough, chest pain,  shortness of breath, abdominal pain, nausea, vomiting or diarrhea.  Her family  drove her in for evaluation.    REVIEW OF SYSTEMS:  CONSTITUTIONAL:  No fever, chills, weight loss.  RESPIRATORY:  No cough, shortness of breath, or wheezing.  CARDIOVASCULAR:  No chest pain, chest pressure, or palpitations.  MUSCULOSKELETAL:  Positive pain in the left third toe.  NEUROLOGICAL:  No headache.    PAST MEDICAL HISTORY:   Fractured toe on the left foot a year ago, fibromyalgia, diabetes.  She had an  MI with stent placement, pancreatitis, hypertension, CAD.    SURGICAL HISTORY:  Stents times 3.    PSYCHIATRIC HISTORY:  No psychiatric history.    FAMILY HISTORY:   Noncontributory in this case.     SOCIAL HISTORY:   The patient does not smoke, drink alcohol, use illicit drugs.    ALLERGIES:   ASPIRIN, TYLENOL - SHE HAS NOT TAKEN THESE SINCE THE ULCERS.    CURRENT MEDICATIONS:  Reviewed in [facility].    PHYSICAL EXAMINATION:   VITAL SIGNS:  Blood pressure 158/79, pulse 101, respirations 22, temperature  98.2, satting at 100% on room air.  GENERAL APPEARANCE:  This is an older appearing than stated age 63 year old  female.  She is crying on the bed, appears to be in acute distress.  She is  cooperative with the evaluation, however.  RESPIRATORY:  Clear and equal breath sounds.  No respiratory distress,  tachypnea, or accessory muscle use.  CARDIOVASCULAR:   Heart regular, without murmurs, gallops, rubs, or thrills.  Capillary refill less than 2 seconds left third toe.  DP pulse 2+ on the left.  MUSCULOSKELETAL:  The patient has tenderness to palpation on the third toe on  the left foot which continues proximally to approximately the mid foot.  There  does appear to be a little bit of swelling on the toe, however, does not  appear to be indurated or erythematous.  It does not feel warm compared to the  other side.  She, also, states that she is having some tenderness as I palpate  around the right  side of her foot, however, no tenderness on the bottom of her  foot.  There is no crepitus or any obvious signs of any deformity.  She has no  tenderness in the remaining toes on that foot and no pain along the bony  prominences of the ankle which she is able to move normally.  NEUROLOGICAL:  The patient is neurovascularly intact left foot and all the  toes on the left foot.    CONTINUATION BY JEFFREY PADGETT, PA-C:      IMPRESSION AND PLAN:   This is a 63 year old female with a history of fibromyalgia, lupus, who  presents for evaluation of acute onset left third toe pain radiating just  proximally to the foot without trauma.  We will x-ray the foot, also give her  a peripheral nerve block.  Differentials include fracture dislocation or  infectious or inflammatory process.    DIAGNOSTIC INTERPRETATIONS:    X-ray of the foot as read by Dr. Delton SeeNelson showing acute fracture dislocation of  the bony abnormality.     EMERGENCY DEPARTMENT COURSE:   Did use an injection of Sensorcaine to provide nerve block.  After she  received that medication the pain had gone away completely.  Said her toe felt  heavy but it was not hurting her anymore.  We placed the patient in a post op  cast shoe, gave her a set of crutches and advised she is going to need to  follow up with podiatry if this does not get better by tomorrow or the next  day.  Continue her regular medication for her pain.  The patient was agreeable  with that plan.  She felt she was ready for discharge at that point and we  agreed also.  Patient given 20 mg prednisone, her vital signs continued to  remain completely stable on my reevaluation afebrile and normal vital signs.      DIAGNOSIS:   Toe pain.     DISPOSITION:   The patient remained completely stable  during the course in the Emergency  Department.  She was discharged home in stable condition with over 3 more days  of prednisone, 20 mg for each day starting tomorrow and gave her name of  podiatry to follow up with. The patient also advised to return to Emergency  Department if condition worsens or new symptoms develop.  The patient was  agreeable with that plan.  The patient was personally evaluated by myself and  Dr. Marijo Sanesimothy Izaia Say who agrees with the above assessment and plan.      ___________________  Gwenyth Allegraimothy S Lola Lofaro MD  Dictated By: Fransisca KaufmannJeffrey Padgett, PA-C    My signature above authenticates this document and my orders, the final  diagnosis (es), discharge prescription (s), and instructions in the [facility]  Pul[facility]ck record.  Nursing notes have been reviewed by the physician/mid-level provider.    If you have any questions please contact [phone].    SB  D:02/19/2014 20:52:23  T: 02/20/2014 06:54:41  [id number]  Electronically Authenticated by:  Gwenyth Allegraimothy S. Deziya Amero, M.D. On 03/06/2014 06:52 AM EDT

## 2014-03-05 NOTE — H&P (Signed)
[facility]  History and Physical  NAME:  [name] [name]  SEX:   F  ADMIT: 03/05/2014  DOB:09-May-1951  MR#    [id number]  ROOM:  CD13  ACCT#  [id number]    I hereby certify this patient for admission based upon medical necessity as  noted below:    <    CHIEF COMPLAINT:  Chest pain.    HISTORY OF PRESENT ILLNESS:  The patient is a 63 year old female with multiple medical problems who  presented with a chief complaint of chest pain which was substernal, sharp,  pressure-like, associated with some shortness of breath.  According to  patient, she has been having this chest pain off and on for a month, but today  the pain became extremely worse, associated with a dizzy feeling which she  describes she felt she was going to pass out.  Symptoms were not improving,  which made her to come to the emergency room.  There were no aggravating or  relieving factors, no associated nausea, vomiting or any diaphoresis.    REVIEW OF SYSTEMS:  Negative for all other systems.  A 12+ review of system was done.    PAST MEDICAL HISTORY:  Coronary artery disease, hypertension, hypercholesterolemia, history of stroke  and history of pulmonary embolism.    PAST SURGICAL HISTORY:  Significant for PTCA with stent placement.    FAMILY HISTORY:  Positive for heart disease and diabetes.    PERSONAL SOCIAL HISTORY:  The patient is a widow, had quit smoking several years ago.  Denies any  alcohol use.    CURRENT MEDICATIONS:  According to med list, the patient was on Elavil 50 at night, calcium once a  day, Coumadin 5 mg 4 days a week and 2.5 mg 3 days a week, Cymbalta 60 mg once  a day, lamotrigine 100 mg twice a day, Ativan 1 mg 3 times a day, Prilosec 20  mg once a day, oxycodone 15 mg every 6 hourly, prednisone 15 mg once a day,  Zocor 80 mg once a day, vitamin D 50,000 units once a week.    ALLERGIES:  ASPIRIN WHICH UPSETS HER STOMACH.    PHYSICAL EXAMINATION:   GENERAL:  Showed a middle-aged female, average built, who was in no acute  distress.  SKIN:  Warm and dry.  HEAD:  Normocephalic, atraumatic.  NECK:  Supple.  No neck vein distention, no carotid bruit, no thyromegaly.  No  cervical lymphadenopathy.  CHEST:  Symmetrical, normal expansion.  HEART:  Normal S1 and S2, regular rate and rhythm, no murmur, no S3.  LUNGS:  Clear.  ABDOMEN:  Soft, nontender, bowel sounds audible.  Liver and spleen not   palpable.  BACK AND SPINE:  Unremarkable.  EXTREMITIES:  Showed no edema, no calf tenderness.  NEUROLOGIC:  The patient is awake and alert.  Strength was somewhat decreased  in the right arm which is from the previous stroke.  LYMPHATIC:  No palpable lymph nodes in the groin, axillary or cervical area.  PSYCHIATRIC:  The patient with appropriate mood and affect.    PERTINENT LABORATORIES:  CBC showed hemoglobin 9.4, hematocrit 29.9, MCV 75.4 with normal WBC count.  Chemistry was normal.  First set of cardiac markers was normal.  CT of the  chest showed no evidence of pulmonary embolism.    ASSESSMENT:  1.  Unstable angina.  2.  Anemia of iron deficiency.  3.  Seizure disorder.  4.  Anxiety disorder.  5.  Hypertension.  6.  Coronary artery disease.    MANAGEMENT PLAN:  The patient being admitted to regular floor on telemetry for observation.  The  patient will be continued on her home medication.  We will request serial  cardiac markers and EKGs.  Cardiology consultation was done by ER physician.  The patient is scheduled for cardiac catheterization in the morning.  The  patient's condition and management plan were discussed with patient's family.      ___________________  Hazel Samsahir Marie Chow MD  Dictated By: .   DS  D:03/05/2014 19:10:26  T: 03/05/2014 20:14:46  [id number]  Electronically Authenticated by:  Hazel SamsAHIR Alette Kataoka, MD On 03/06/2014 03:12 PM EDT

## 2014-03-05 NOTE — ED Provider Notes (Signed)
[facility]  EMERGENCY [facility] TREATMENT REPORT  NAME:  [name] [name], [name] [name]  SEX:   F  ADMIT: 03/05/2014  DOB:   [birth year]  MR#    [id number]  ROOM:  CD13  TIME DICTATED: 05 24 PM  ACCT#  [id number]        I hereby certify this patient for admission based upon medical necessity as  noted below:     TIME OF SERVICE:  1445.    CHIEF COMPLAINT:  Chest pain, dyspnea.    HISTORY OF PRESENT ILLNESS:  The patient is a 63 year old female presenting with intermittent chest pain in  the past week.  It is left-sided, sharp in nature, lasting several minutes and  resolving.  She has taken nitroglycerin several times throughout the week,  which does help with the pain.  She is here today after experiencing worsening  pain during church today while she was at rest.  It was again sharp in nature,  left-sided, nonradiating.  It did resolve on its own without intervention.  After church, she was at the drugstore with her daughter when she began  feeling tightness and shortness of breath.  It was at that time she decided to  come in for further evaluation.  She does not have any cough or cold symptoms.  No nausea or vomiting, no change in medication regimen.  She does have a  history of coronary artery disease with stent placement 10 years ago.  Last  cardiac evaluation was in March of this year when she had normal stress test  done by Dr. Cleon GustinAshby with [facility].  She does have a history of PE approximately 5  years ago, status post knee surgery.  She is on warfarin, which she believes  is therapeutic at last INR.  She has not had any trauma to the chest wall.  Unable to identify any reason for chest wall muscular pain.  Has not taken  anything for symptoms today.    REVIEW OF SYSTEMS:  CONSTITUTIONAL:  No fever, chills, or weight loss.    EYES:   No visual symptoms.    ENT:  No sore throat, runny nose, or other URI symptoms.    HEMATOLOGIC:  No hemoptysis.   RESPIRATORY:  Exertional shortness of breath.  No cough, hemoptysis.    CARDIOVASCULAR:  As above.    GASTROINTESTINAL:  No vomiting, diarrhea, or abdominal pain.    GENITOURINARY:  No dysuria, frequency, or urgency.    MUSCULOSKELETAL:  No joint pain or swelling.    INTEGUMENTARY:  No rashes.      PAST MEDICAL HISTORY:  Coronary artery disease, diabetes, fibromyalgia, pancreatitis, hypertension.    PAST SURGICAL HISTORY:  Stent placement.    PSYCHIATRIC HISTORY:  None.    SOCIAL HISTORY:  The patient denies tobacco, alcohol and drug use.    FAMILY HISTORY:  No known family history of coronary disease.    CURRENT MEDICATIONS:    Unknown.      ALLERGIES:  ASPIRIN AND TYLENOL.    PHYSICAL ASSESSMENT:  VITAL SIGNS:  Blood pressure is 130/82, pulse 92, respirations 18, temperature  98.4, pain and 90, O2 saturation  100% on room air.  GENERAL APPEARANCE:  The patient appears well developed and well nourished.  Appearance and behavior are age and situation appropriate.    HEENT:  Eyes:  Conjunctivae clear.  Pupils are PERL.  Mouth/Throat:  Surfaces  of the pharynx, palate, and tongue are pink, moist, and without lesions.  Teeth and  gums unremarkable.    RESPIRATORY:  Clear and equal breath sounds.  No respiratory distress,  tachypnea, or accessory muscle use.  No wheezes or rhonchi, no increased work  of breathing.  CARDIOVASCULAR:  Heart regular rate and rhythm.  VASCULAR:  Calves soft, nontender, no peripheral edema.  Denna HaggardHomans' sign   negative.  GASTROINTESTINAL:  Soft, nontender.  MUSCULOSKELETAL:  The patient does have tenderness to palpation over the left  sternal border.  No overlying skin changes or bruising.  NEUROLOGIC:  The patient is alert and oriented, no facial asymmetry or  dysarthria.  PSYCHIATRIC:  Judgment appears appropriate.    INITIAL ASSESSMENT AND MANAGEMENT PLAN:    IMPRESSION/PLAN:     Patient with chest pain.  Acute ischemic coronary disease must be considered   first, and the patient protected against the consequences of same, while other  etiologies (including infectious, pulmonary, gastrointestinal, and  musculoskeletal) are considered.    The patient does have a history of coronary disease which makes her story  further concerning.  Will obtain an  EKG, troponin, labs, chest x-ray.      CONTINUATION BY Cataleyah Colborn H. Nayali Talerico, MD:     INITIAL ASSESSMENT AND MANAGEMENT PLAN:   Patient presents here with persistent anginal type symptoms.  Has had a  pulmonary embolism in the past.  She had coronary stenting 10 years ago,  followed by Dr. Kristen CardinalGottimukkala, who wants to stay at this hospital even though  her cardiologist does not come here.  Patient will be evaluated further for  unstable angina, myocardial ischemia and infarction.  Nursing notes were  reviewed. Old records reviewed.    LABORATORY TESTS:  EKG is sinus rhythm with no acute ischemic change.  Chest x-ray was normal per  radiologist.  White count 6.8, hemoglobin and hematocrit 29 and 9, normal  troponin.      COURSE IN EMERGENCY DEPARTMENT:   Patient was given Nitro-paste, repeated doses and morphine for pain control  and felt better.  Monitor remained in sinus rhythm.  Consulted Dr. Renaee MundaAdler for  [facility] who asked the patient to be admitted to Dr. Marcelino FreestoneAhmed's  service.  Dr. Tasia CatchingsAhmed is supposed to call back and admitted the patient for  overnight stay for Dr. Renaee MundaAdler to see her in the morning and determine if she  will require cardiac catheterization.  Patient had nuclear stress test in  March which was not showing ischemia at that time.      CLINICAL IMPRESSION:  1.  Acute unstable angina pain.  2.  Coronary artery disease, cardiac stenting in the past.    DISPOSITION:  Admit to Dr. Tasia CatchingsAhmed.  He was also aware the CT showed no evidence of pulmonary  embolism but did show liver abnormalities which she will follow up as the  admitting physician.      ___________________  Smitty CordsErik H Merdith Boyd MD  Dictated By: Debbora PrestoMae M. Clelia CroftShaw, PA-C     My signature above authenticates this document and my orders, the final  diagnosis (es), discharge prescription (s), and instructions in the [facility]  Pul[facility]ck record.  Nursing notes have been reviewed by the physician/mid-level provider.    If you have any questions please contact [phone].    LR  D:03/05/2014 17:24:53  T: 03/05/2014 09:81:1920:03:54  [id number]  Electronically Authenticated by:  Smitty CordsErik H. Francisco Eyerly, M.D. On 03/06/2014 09:11 AM EDT

## 2014-03-06 NOTE — Discharge Summary (Signed)
[facility]  [facility]   NAME:  [name], [name]  SEX:   F  ADMIT: 03/05/2014  DISCH:   DOB:   [birth year]  MR#    [id number]  ACCT#  [id number]    cc: Brita RompHOMAS GRANT M.D.    DATE OF ADMISSION:  03/05/2014.    DATE OF DISCHARGE.  03/06/2014.    FINAL DIAGNOSES:  1.  Chest pain, most likely due to anxiety.  2.  Anemia of iron deficiency.  3.  Seizure disorder.  4.  Anxiety disorder.  5.  Hypertension.  6.  Coronary artery disease.    HOSPITAL COURSE:  The patient is a 63 year old female who came to the emergency room with a  chief complaint of chest pain which was substernal, pressure-like, associated  with some shortness of breath.  The patient also became dizzy, lightheaded  which she described as she felt like she was going to pass out.  The patient  was admitted to regular floor on telemetry for observation.  The patient was  continued on her home medication.  Serial cardiac markers and EKGs were done.  The patient was ruled out for MI.  Cardiology consultation was done.  The  patient had cardiac catheterization done which showed no evidence of coronary  artery disease.  During the hospital stay, the patient remained stable.  On  the day of discharge, the patient was feeling a little better.  She denied any  chest pain, but was still feeling weak.  Denies any nausea, vomiting, any  shortness of breath.    PHYSICAL EXAMINATION ON THE DAY OF DISCHARGE:  GENERAL:  A middle-aged female who is in no acute distress.  VITAL SIGNS:  Temperature 97.6, pulse 80, respirations 16, blood pressure  124/75.  HEENT:  Shows some conjunctival pallor.  LUNGS:  Clear.  HEART:  Normal S1 and S2, regular rate and rhythm.  NEUROLOGIC:  The patient is awake, alert.    The patient is being discharged home with followup appointment with her  primary care provider in 1 week.    CONDITION ON DISCHARGE:  Stable.    ACTIVITY:  As tolerated.    DIET:  Cardiac diet.    DISCHARGE MEDICATIONS:   The patient will continue oxycodone 15 mg q.6 h. as needed for pain, Ativan 1  mg 3 times a day as needed for anxiety, Coumadin as directed by her primary  care provider, Lamictal 100 twice a day, Elavil 50 at night.  Cymbalta 60 once  a day, Zocor 80 once a day, prednisone once a day.  Calcium was discontinued,  Prilosec 20 once a day, vitamin D 50,000 units once a week, iron supplement  twice a day.    The patient was advised to have repeat CBC done in one month.      ___________________  Hazel Samsahir Amya Hlad MD  Dictated By: .   FS  D:03/06/2014 14:04:11  T: 03/06/2014 14:33:25  [id number]  Electronically Authenticated by:  Hazel SamsAHIR Kadon Andrus, MD On 03/06/2014 03:07 PM EDT

## 2014-04-01 NOTE — ED Provider Notes (Signed)
[facility]  EMERGENCY DEPARTMENT TREATMENT REPORT  NAME:  [name] [name], [name] [name]  SEX:   F  ADMIT: 04/01/2014  DOB:   20-Oct-1950  MR#    [id number]  ROOM:    TIME DICTATED: 02 26 PM  ACCT#  [id number]    cc: Charlett Nose MD    CHIEF COMPLAINT:  Left-sided flank pain.    HISTORY OF PRESENT ILLNESS:  This is a 63 year old female who presents with pain left flank started  suddenly today.  It is sharp, severe, 10 on a scale of 1 to 10, constant,  nonradiating, not associated with any nausea, no vomiting, no urinary  complaints.  The patient states she had similar pain a month ago.  She was  seen in the hospital at that time; had cardiac evaluation which was negative.  It to get better but then returned again today.  The pain is worse when she  moves.  The patient states history of peptic ulcer disease greater than 20  years ago.  Had partial gastric resection.    REVIEW OF SYSTEMS:  CONSTITUTIONAL:  No fever.  ENT:  No sore throat.  RESPIRATORY:  No difficulty breathing.  CARDIOVASCULAR:  Had some chest pain last month.  GASTROINTESTINAL:  Positive flank pain, no vomiting.  GENITOURINARY:  No dysuria.  MUSCULOSKELETAL:  No lower extremity pain or swelling.  INTEGUMENTARY:  No rash.  HEMATOLOGICAL:  No complaints of any bleeding problems.  NEUROLOGICAL:  No numbness of extremities.    PAST MEDICAL HISTORY:  The patient has had prior cardiac stents, history of prior CVA with  right-sided weakness and seizure disorder.    SOCIAL HISTORY:  The patient lives with family.    FAMILY HISTORY:  Positive for cardiac disease.    MEDICATIONS:  Flexeril, gabapentin, Lamictal, oxycodone.    PHYSICAL EXAMINATION:  VITAL SIGNS:  Blood 145/80, pulse 91, respirations 15, O2 saturation 100%.  GENERAL:  Well-developed, well-nourished female, alert, appearing  uncomfortable, holding left flank.  HEENT:  Eyes:  Conjunctivae clear, lids normal.  Pupils equal, symmetrical.   Mouth/Throat:  Surfaces of the pharynx, palate, and tongue are pink, moist,  and without lesions.     NECK:  Supple, nontender, symmetrical, no masses or JVD, trachea midline,  thyroid not enlarged, nodular, or tender.  No cervical or submandibular  lymphadenopathy palpated.   HEART:  Sinus rhythm, no murmurs heard.  LUNGS:  Clear, no wheezing, no retractions, no respiratory distress.  ABDOMEN:  Soft, no tenderness on exam.  No guarding, no rebound, no mass  palpable.  BACK:  Some tenderness noted left flank.  No edema, no erythema.  No bony  tenderness.  SKIN:  No rash.  EXTREMITIES:  No edema.  Negative straight leg raise.  DP pulses 2+ and equal  bilaterally.  NEUROLOGICAL:  The patient alert, answering questions appropriately, no focal  deficits.    COURSE IN THE EMERGENCY DEPARTMENT:  Records were reviewed.  The patient was seen here 07/05.  At that time she  seemed to be complaining of more chest pain, left-sided.  She had a cardiac  evaluation, was in observation, had a CT of the chest which was negative and  cardiac enzymes which were negative.    ASSESSMENT AND MANAGEMENT PLAN:  The patient's pain seems to be more flank with a sudden onset of these  symptoms.  She appears uncomfortable this may be obstructive uropathy, renal  in nature.  We will check a urine and labs, obtain a noncontrast  CT on the  patient.    COURSE IN THE EMERGENCY DEPARTMENT:  The patient was medicated for pain, was given morphine and Zofran IV and some  IV fluids.    CONTINUATION BY DIANA A. HOULE, PA:    DIAGNOSTIC INTERPRETATIONS:  CBC obtained, white count 5.4, hemoglobin 10.3, hematocrit 34.6.  Urine  obtained, 1 to 4 epithelial cells, 1 to 4 white cells.  Chemistry was within  normal limits.  Lipase was within normal limits.  A noncontrast CT of the  abdomen and pelvis shows gastric bypass, cholecystectomy, unremarkable CT,  prior hysterectomy.  Kidneys, ureters and bladder normal per radiologist.     COURSE IN EMERGENCY DEPARTMENT COURSE:  The patient was medicated with morphine and Zofran IV.  Continued to complain  of pain.  Very anxious appearing.  She was given Toradol IV, Ativan IV.  Repeat evaluation at 1425:  She looks comfortable, pain is gone.  Advised of  the results of findings.  With her appearance, pain seems likely muscular in  nature.  She did have some tenderness on exam.  No fever and no peritoneal  findings.    FINAL DIAGNOSIS:  Flank pain.    DISPOSITION AND PLAN:  The patient discharged.  Prescription given for some Robaxin she may take in  lieu of the Flexeril.  She is to follow up with her doctor.  The patient was  evaluated by myself and Dr. Wilfrid LundVanden Hoek who agrees with the above assessment  and plan.      ___________________  Erlinda Hongodd Vanden Hoek MD  Dictated By: Windell Hummingbirdiana A. Houle, PA    My signature above authenticates this document and my orders, the final  diagnosis (es), discharge prescription (s), and instructions in the [facility]  Pul[facility]heck record.  Nursing notes have been reviewed by the physician/mid-level provider.    If you have any questions please contact [phone].    SC  D:04/01/2014 14:26:40  T: 04/01/2014 14:53:27  [id number]  Electronically Authenticated by:  Cylis Ayars L. Wilfrid LundVanden Hoek, M.D. On 04/02/2014 10:44 AM EDT

## 2014-04-17 NOTE — ED Provider Notes (Signed)
[facility]  EMERGENC[facility] TREATMENT REPORT  NAME:  [name] [name], [name] [name]  SEX:   F  ADMIT: 04/17/2014  DOB:   1950-12-19  MR#    [id number]  ROOM:    TIME DICTATED: 09 35 AM  ACCT#  [id number]    cc: Charlett NoseALBERT FINCH MD    PRIMARY CARE PHYSICIAN:  Dr. Dorothyann GibbsFinch    CHIEF COMPLAINT:  Left flank pain.    HISTORY OF PRESENT ILLNESS:  This is a 63 year old female arriving via medic with complaint of left flank  pain.  She states it has been intermittent for the past 1 month, but worse  over the past 3 days.  She reports a sharp stabbing pain that worsens with  movement just below the left lower rib border just posterior to the mid  axillary line.  She denies any associated shortness of breath but states this  morning she began experiencing a cough.  No fevers or chills.  No abdominal  pain or vomiting.  She is in pain management, takes oxycodone.  States that  this medication as well as Robaxin has not been helping.  Therefore, she  returns at this time for further evaluation.  She denies any urinary symptoms.    REVIEW OF SYSTEMS:  CONSTITUTIONAL:  No fever or chills.  EYES:   No visual symptoms.  ENT:  No sore throat, runny nose, or other URI symptoms.  RESPIRATORY:  Cough, no dyspnea or pleuritic pain.  CARDIOVASCULAR:  No chest pain.  GASTROINTESTINAL:  No abdominal pain, nausea or vomiting.  GENITOURINARY:  No dysuria, frequency or urgency.  MUSCULOSKELETAL:  As above.  INTEGUMENTARY:  No rash.  NEUROLOGICAL:  No headache.    PAST MEDICAL HISTORY:  Seizures, coronary artery disease, AMI status post stenting, fibromyalgia,  pancreatitis, hypertension, hysterectomy, cholecystectomy.    SOCIAL HISTORY:  Nonsmoker.    MEDICATIONS:  Reviewed in previous Ibex entry including Flexeril, gabapentin, Lamictal,  oxycodone and Robaxin.    ALLERGIES:  REVIEWED IN IBEX.    PHYSICAL EXAMINATION:  VITAL SIGNS:  Blood pressure 140/80, pulse 92, respirations 18, temperature   98.6, O2 saturation 97% on room air, pain rated 10 out of 10.  GENERAL APPEARANCE:  The patient appears somewhat older than stated age.  She  is lying on the stretcher.  She is crying.  She is somewhat hysterical.  HEENT:  Head:  NC/AT.  Eyes:  Conjunctivae are clear, nonicteric.  Mouth/throat:  Oral mucosa is pink, appropriately moist.  NECK:  Supple, symmetrical, no JVD.  No cervical or submandibular  lymphadenopathy palpated.   RESPIRATORY:  Lungs are clear to auscultation bilaterally, no wheezing, equal  breath sounds throughout all lung fields.  CARDIOVASCULAR:  Heart regular rate and rhythm, no murmurs.  GASTROINTESTINAL:  Abdomen is soft, nontender.  MUSCULOSKELETAL:  She moves all extremities appropriately and without  difficulty.  There is reproducible discomfort along the left lower rib border  to the posterior thorax just posterior to the mid axillary line, but no  palpable abnormalities along this area.  SKIN:  Warm and dry.  There is no rash.  NEUROLOGIC:  The patient is alert and oriented.  Motor strength is 5/5 to  bilateral upper and lower extremities.    CONTINUED BY MICHELLE M. ADAMS, PA-C:     INITIAL ASSESSMENT AND MANAGEMENT PLAN:    A patient presenting with complaint of left-sided flank pain.  She states it  has been present intermittently for the past 1 month, but significantly  worsened over the past 3 days.  She has been here to this ED for evaluation of  similar symptoms.  After reviewing records it appears as though she had a CT  scan of the abdomen and pelvis as well as abdominal labs, these were  unremarkable.  There is no sign of obstructive uropathy.  She is currently in  pain management.  Therefore, she was sent home with Robaxin and advised to  continue her oxycodone as needed.  Today the pain seems to be more so to the  left posterior thorax just posterior to the mid axillary line, she has  reproducible discomfort along the rib border, states that she has now had a   cough, concerns for possible pneumonia causing referred pain versus a PE.  Today we will obtain basic labs, begin with a chest x-ray, if this is  unremarkable, will send for a CTA.    DIAGNOSTIC STUDIES:  Chest x-ray read by radiology as negative.  CBC with hemoglobin and hematocrit  9.6 and 31.7.  BMP unremarkable.  CTA of the chest was read by radiology as no  aortic dissection or pulmonary embolism, COPD with biliary dilatation similar  to previous abdomen CT scan on 04/01/14.    COURSE IN THE EMERGENCY DEPARTMENT:  The patient remained stable throughout her stay.  Initially when she arrived,  she was quite hysterical, obviously having quite a bit of pain.  She was given  6 mg of IV morphine, 4 mg of IV Zofran and 1 mg of IV Ativan after which she  had significant improvement in her discomfort, she was able to rest quietly  and seemed to be feeling much better.  Results of diagnostics were reviewed  with the patient.  At this time CTA is negative for any acute abnormalities.  It is unclear of the source of the patient's discomfort.  She just had an  extensive workup approximately 2 weeks ago for the similar symptoms including  an abdominal CT that was unremarkable.  There is no evidence of an obstructive  uropathy either.  Today it is unclear of the cause of the patient's pain, but  now I suspect it to likely be muscular given the reproducible discomfort as  well as the negative diagnostics.  She states she is also experiencing similar  pain and was sent for a stress test by her doctor.  I was able to retrieve  these findings.  She had a normal nuclear stress test in 10/2013.  I do not  suspect this to be atypical cardiac.  I believe she is able to be discharged  home.  We will provide her with a prescription for Valium.  I have instructed  directly to the patient that she needs to call her pain management doctor, Dr.  Buford Dresser to discuss with them if she is allowed to go ahead and fill this as   this is a controlled substance and she is currently on a pain management  contract.  She voiced understanding and agreement and states that she will  call before filling the medication.    CLINICAL IMPRESSION AND DIAGNOSIS:  Left flank pain.    DISPOSITION AND PLAN:  The patient discharged home in stable condition.  Instructed to follow up with  PCP and her pain management doctor.  Given prescription for Valium.  Advised  that she may return at anytime for any worsening or symptoms of concern.  The  patient was personally evaluated by myself and Dr. Wenda Overland  who agrees  with the above assessment and plan.      ___________________  Liberty Handy Himmel-Daniela Hernan DO  Dictated By: Zachary George. Pernell Dupre, PA-C    My signature above authenticates this document and my orders, the final  diagnosis (es), discharge prescription (s), and instructions in the [facility]  Pulsecheck record.  Nursing notes have been reviewed by the physician/mid-level provider.    If you have any questions please contact [phone].    LH  D:04/17/2014 09:35:38  T: 04/17/2014 10:04:02  [id number]  Electronically Authenticated by:  Carolin Guernsey, DO On 04/26/2014 03:27 PM EDT

## 2014-09-01 LAB — CBC WITH AUTOMATED DIFF
BASOPHILS: 0.8 % (ref 0–3)
EOSINOPHILS: 1.9 % (ref 0–5)
HCT: 31.8 % — ABNORMAL LOW (ref 37.0–50.0)
HGB: 9.5 gm/dl — ABNORMAL LOW (ref 13.0–17.2)
IMMATURE GRANULOCYTES: 0.3 % (ref 0.0–3.0)
LYMPHOCYTES: 28.7 % (ref 28–48)
MCH: 23.6 pg — ABNORMAL LOW (ref 25.4–34.6)
MCHC: 29.9 gm/dl — ABNORMAL LOW (ref 30.0–36.0)
MCV: 78.9 fL — ABNORMAL LOW (ref 80.0–98.0)
MONOCYTES: 3.4 % (ref 1–13)
MPV: 10.7 fL — ABNORMAL HIGH (ref 6.0–10.0)
NEUTROPHILS: 64.9 % — ABNORMAL HIGH (ref 34–64)
NRBC: 0 (ref 0–0)
PLATELET: 338 10*3/uL (ref 140–450)
RBC: 4.03 M/uL (ref 3.60–5.20)
RDW-SD: 54.5 — ABNORMAL HIGH (ref 36.4–46.3)
WBC: 6.4 10*3/uL (ref 4.0–11.0)

## 2014-09-01 LAB — POC CHEM8
BUN: 11 mg/dl (ref 7–25)
CALCIUM,IONIZED: 4.7 mg/dL (ref 4.40–5.40)
CO2, TOTAL: 25 mmol/L (ref 21–32)
Chloride: 105 mEq/L (ref 98–107)
Creatinine: 0.7 mg/dl (ref 0.6–1.3)
Glucose: 80 mg/dL (ref 74–106)
HCT: 34 % — ABNORMAL LOW (ref 38–45)
HGB: 11.6 gm/dl — ABNORMAL LOW (ref 12.4–17.2)
Potassium: 4.4 mEq/L (ref 3.5–4.9)
Sodium: 142 mEq/L (ref 136–145)

## 2014-09-01 LAB — GLUCOSE, POC: Glucose (POC): 157 mg/dL — ABNORMAL HIGH (ref 65–105)

## 2014-09-01 LAB — PROTHROMBIN TIME + INR
INR: 1.4 — ABNORMAL HIGH (ref 0.0–1.1)
Prothrombin time: 17.1 seconds — ABNORMAL HIGH (ref 11.5–14.0)

## 2014-09-01 NOTE — ED Provider Notes (Addendum)
[facility]  EMERGENCY DEPARTMENT TREATMENT REPORT  NAME:  Smylie, [name] [name]  SEX:   F  ADMIT: 09/01/2014  DOB:   [birth year]  MR#    [id number]  ROOM:  CD01  TIME DICTATED: 03 42 PM  ACCT#  [id number]    cc: [facility]     I hereby certify this patient for admission based upon medical necessity as  noted below:    PRIMARY CARE:   [facility].    CHIEF COMPLAINT:  Dizziness and sensation of falling.    HISTORY OF PRESENT ILLNESS:  The patient states that over the last 4 days [name] [name] has had intermittent attacks  of a sensation that [name] [name] is falling backwards.  The first time this has  happened [name] [name] had just stood up from the couch and [name] [name] thought it was because  [name] [name] stood up too quickly.  The sensation resolved in just a few seconds,  certainly less than a minute.  However, [name] [name] has had subsequent attacks, both  lying in bed and sitting up in a chair.  Thus, they not seem to be related to  standing up quickly.  [name] [name] has some intermittent chest pains, but these do not  seem to be associated with the attacks.  [name] [name] denies any headaches associated  with the attacks, although [name] [name] did have a headache last night.  [name] [name] has no new  focal neurologic  deficits, but does still have right upper and lower motor  extremity weakness from a stroke approximately 6 years ago.    PAST MEDICAL HISTORY:  Complicated, to include a hemorrhagic stroke in 2009, lupus, and a pulmonary  embolus.    REVIEW OF SYSTEMS:  CONSTITUTIONAL:  No fever, chills, or weight loss.  EYES:   No visual symptoms.  ENT:  No sore throat, runny nose, or other URI symptoms.  ENDOCRINE:  No diabetic symptoms.  HEMATOLOGIC:  [name] [name] bruises easily.  ALLERGIC/IMMUNOLOGIC:  No urticaria or allergy symptoms.  RESPIRATORY:  Sometimes feels like it is difficult to catch her breath, but  this is transient.  CARDIOVASCULAR:  Occasional chest pains, no palpitations.  GASTROINTESTINAL:  No vomiting, diarrhea, or abdominal pain.   GENITOURINARY:  No dysuria, frequency, or urgency.  MUSCULOSKELETAL:  No joint pain or swelling.  INTEGUMENTARY:  No rashes.  NEUROLOGIC:  Intermittent headaches, right arm and leg weakness which is  chronic.  PSYCHIATRIC:  Judgment appears or homicidal ideation.    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    PAST SURGICAL HISTORY:  Includes cholecystectomy, hysterectomy.    SOCIAL HISTORY:  [name] [name] is a former tobacco smoker, quit 30 years ago.  No alcohol or drugs.    FAMILY HISTORY:  Noncontributory.    MEDICATIONS:  Include Gabapentin, Lamictal, Percocet.    ALLERGIES:  ASPIRIN AND TYLENOL.    PHYSICAL EXAMINATION:  VITAL SIGNS:  Blood pressure 118/78, pulse 89, respirations 18, satting 100%  on room air, temperature 98.1, pain 0 out of 10 currently.  GENERAL APPEARANCE:  [name] [name] is a well appearing middle-age female, resting  comfortably in bed, occasionally anxious when relating her story.  HEENT:  Eyes:  Conjunctivae clear, lids normal.  Pupils equal, symmetrical,  and normally reactive.  Ears/Nose:  Hearing is grossly intact to voice.  Internal and external examinations of the ears and nose are unremarkable.  Mouth/Throat:  Surfaces of the pharynx,  palate, and tongue are pink, moist,  and without lesions.  RESPIRATORY:  Clear and equal breath sounds.  No respiratory distress,  tachypnea, or accessory muscle use.    CARDIOVASCULAR:   Heart regular, without murmurs, gallops, rubs, or thrills.  Pulses are 2+ and equal throughout bilateral upper and lower extremities.  [name] [name]  has no peripheral edema.  GI:  Abdomen soft, nontender, without complaint of pain to palpation.  No  hepatomegaly or splenomegaly.   MUSCULOSKELETAL:  Stance is normal.  SKIN:  Warm and dry without rashes.  NEUROLOGIC:  [name] [name] is alert and oriented.  Cranial nerves II-XII are intact.   [name] [name] has 4 to 5 muscle strength of the right arm and the right leg, 5/5 muscle  strength of the left upper and lower extremities.  Sensation is grossly intact  throughout.  [name] [name] has a negative pronator drift.  When I stand her up and close  her eyes [name] [name] consistently falls backwards.  This was repeated several times  and is consistent.  PSYCHIATRIC: Judgment appears appropriate.  Recent and remote memory appear to  be intact.  [name] [name] is somewhat anxious and concerned about her condition.    INITIAL ASSESSMENT AND PLAN:     The patient is a 64 year old female with a complicated medical history to  include hemorrhagic stroke, lupus, and subsequent seizure disorder from her  stroke.  Intermittent paroxysms of a feeling of falling backwards with  evidence on exam of a new neurological finding that is concerning for perhaps  a cerebellar process.  To that end, we are going to image her head, send off  labs, and discuss with the stroke team to get a full neurological evaluation  here in the Emergency Department.  Her symptoms are at least 88 days old, so  [name] [name] is not a candidate for lytics at this time.  In addition, [name] [name] has had a  hemorrhagic stroke in the past.  The patient is currently comfortable, vital  signs are stable.  Workup is ongoing.    CONTINUATION BY ADAM FORREST, MD:    DIAGNOSTIC RESULTS:  Her CBC is consistent with known chronic microcytic anemia.  Electrolytes  unremarkable.  INR is 1.4.  Her head CT shows no acute intracranial process.    EMERGENCY DEPARTMENT COURSE:  The patient has remained fairly comfortable here in the Emergency Department  with unremarkable vital signs.  My big concern remains the concern for  cerebellar symptoms, given that [name] [name] falls backwards every time [name] [name] closes her  eyes.  To that end, I have discussed the case with Dr. Tristan Schroeder, who will admit  the patient for stroke workup essentially and further imaging with an MRI.    FINAL DIAGNOSES:  1.  Ataxia.  2.  Diabetes.  3.  Hypertension.   4.  Coronary artery disease.    DISPOSITION:  Admission to the hospital.    CONDITION:  Stable.      The patient was personally evaluated by myself and Dr. Wilfrid Lund, who agrees  with the above assessment and plan.        ADDENDUM BY Angy Swearengin Wilfrid Lund, MD:    I interviewed and examined the patient.  I discussed with the resident and  agree with their evaluation and plan as documented here.  The patient is a  65 year old female with a headache, dizziness, falls back when [name] [name] tries to  stand up.  The patient will have lab work done.  CT scan, likely  hospitalization.  Stroke  nurse will be notified as well.      ___________________  Erlinda Hong MD  Dictated By: Coralee North. Randa Spike, MD    My signature above authenticates this document and my orders, the final  diagnosis (es), discharge prescription (s), and instructions in the [facility]  Pul[facility]heck record.  Nursing notes have been reviewed by the physician/mid-level provider.    If you have any questions please contact [phone].    NS  D:09/01/2014 15:42:27  T: 09/01/2014 16:45:29  [id number]  Electronically Authenticated by:  Dak Szumski L. Wilfrid Lund, M.D. On 09/02/2014 04:41 PM EST

## 2014-09-02 LAB — CKMB PROFILE
CK - MB: 0.8 ng/ml (ref 0.0–3.6)
CK - MB: <0.5 ng/ml (ref 0.0–3.6)
CK-MB Index: 0.8 % (ref 0.0–4.9)
CK-MB Index: 0.5 % (ref 0.0–4.9)
CK: 102 U/L (ref 26–192)
CK: 84 U/L (ref 26–192)

## 2014-09-02 LAB — POC URINE MICROSCOPIC

## 2014-09-02 LAB — VITAMIN B12: Vitamin B12: 330 pg/ml (ref 193–986)

## 2014-09-02 LAB — URINALYSIS W/ RFLX MICROSCOPIC
Bilirubin: NEGATIVE
Blood: NEGATIVE
Glucose: NEGATIVE mg/dl
Ketone: NEGATIVE mg/dl
Nitrites: NEGATIVE
Protein: NEGATIVE mg/dl
Specific gravity: 1.015 (ref 1.005–1.030)
Urobilinogen: 0.2 mg/dl (ref 0.0–1.0)
pH (UA): 6 (ref 5.0–9.0)

## 2014-09-02 LAB — TROPONIN I
Troponin-I: <0.015 ng/ml (ref 0.00–0.09)
Troponin-I: <0.015 ng/ml (ref 0.00–0.09)

## 2014-09-02 LAB — LIPID PANEL
CHOL/HDL Ratio: 2.8 Ratio (ref 0.0–4.4)
Cholesterol, total: 194 mg/dl (ref 140–199)
HDL Cholesterol: 69 mg/dl (ref 40–96)
LDL, calculated: 117 mg/dl (ref 0–130)
Triglyceride: 42 mg/dl (ref 29–150)

## 2014-09-02 LAB — GLUCOSE, POC
Glucose (POC): 92 mg/dL (ref 65–105)
Glucose (POC): 102 mg/dL (ref 65–105)
Glucose (POC): 136 mg/dL — ABNORMAL HIGH (ref 65–105)

## 2014-09-02 LAB — TSH 3RD GENERATION: TSH: 6.15 u[IU]/mL — ABNORMAL HIGH (ref 0.358–3.740)

## 2014-09-02 LAB — FOLATE: Folate: 18.5 ng/ml — ABNORMAL HIGH (ref 3.1–17.5)

## 2014-09-02 LAB — PROTHROMBIN TIME + INR
INR: 1.4 — ABNORMAL HIGH (ref 0.0–1.1)
Prothrombin time: 16.4 seconds — ABNORMAL HIGH (ref 11.5–14.0)

## 2014-09-02 LAB — HEMOGLOBIN A1C WITH EAG: Hemoglobin A1c: 5.4 % (ref 4.8–6.0)

## 2014-09-03 LAB — GLUCOSE, POC
Glucose (POC): 102 mg/dL (ref 65–105)
Glucose (POC): 95 mg/dL (ref 65–105)
Glucose (POC): 119 mg/dL — ABNORMAL HIGH (ref 65–105)

## 2014-09-03 LAB — PROTHROMBIN TIME + INR
INR: 2 — ABNORMAL HIGH (ref 0.0–1.1)
Prothrombin time: 22 seconds — ABNORMAL HIGH (ref 11.5–14.0)

## 2014-09-03 LAB — T4, FREE: Free T4: 0.74 ng/dl — ABNORMAL LOW (ref 0.76–1.46)

## 2014-09-03 NOTE — Consults (Addendum)
[facility]  CONSULTATION REPORT  NAME:  [name], [name]  SEX:   F  ADMIT: 09/01/2014  DATE OF CONSULT: 09/03/2014  REFERRING PHYSICIAN:    DOB: June 21, 1951  MR#    [id number]  ROOM:  CD01  ACCT#  [id number]    cc: CRYSTAL STOKES DO    DATE OF ADMISSION:   09/01/2014.    REFERRING PHYSICIAN:  Dr. Les Pou    The patient is a 64 year old right-handed black female who I have been asked  to evaluate for imbalance.    The patient has multiple medical problems outlined below.  Most of these have  not been active.  There is note of her having systemic lupus erythematosus,  but she is on no medications, although in the past she was on prednisone and  discontinued it on her own.  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  She has a history of cardiac  disease with stenting.  There is a note of a hemorrhagic stroke in 2009,  although she did not mention history of stroke to me.  There is subsequent  seizure disorder for which she is seen by Dr. Legrand Como, but he told her she  had \\stress seizures\\ but she is on Lamictal for this.    For the past 3 or 4 days the patient has had episodes of imbalance and  falling.  When this initially started she went from a recumbent to a seated  position and had a lightheaded sensation and a feeling like she was falling  backwards even know she was actually bent forwards.  With family assistance  she laid down, sat up again and had no problem.  A short time after that she  got into a rocking chair, tilted her head back and this recurred with a  sensation that she was falling.    On New Year's Eve the patient was at church, she had a squeezing sensation on  the vertex of her head radiating down to the forehead and simultaneously had  the same falling sensation.   Yesterday she went to the pharmacy, sat down in  a chair, felt well, but then when she stood up also had a sensation of   falling.  This is happening several times a day, each episode only lasts a few  seconds, at times it is so severe that she actually does fall to the ground.  There was no loss of consciousness.  She has a sensation of falling backwards.  One time her daughter told her that \\Your speech sounds funny\\ but in  general this is not accompanied by numbness, weakness of any side, paralysis,  although it may be accompanied by blurred vision.    The patient had an MRI scan of the brain which I have personally reviewed.  There is what appears to be a venous anomaly showing up only on contrast  enhancement in the parafalcine medial parietal region.  There is scattered  evidence of small vessel disease not particularly severe.  The brain stem  looks fine.  I did have a look at the orbital contents and there did not  appear to be engorgement of the extraocular muscles.  Laboratory assessment  showed a urinalysis with moderate leukocyte esterase activity and 10 to 14  WBCs and 1+ bacteria.  Other laboratory assessment showed at most modestly  elevated sugars, but hemoglobin A1c of 5.4%.  Folate is  18.5.  Cardiac enzymes  are normal.  Fasting lipid profile shows cholesterol 194, HDL 69,  triglycerides 42, LDL 117.  Prothrombin time on admission was subtherapeutic,  but is now 2.0.  TSH is elevated 6.150 with free T4 slightly depressed at 0.74  (normal 0.76 to 1.46).  Vitamin B12 is 330.    ALLERGIES:  ASPIRIN CAUSES GASTRIC ULCER, TYLENOL CAUSES RASH AND ITCHING, LATEX CAUSES  ITCHING.    MEDICATIONS:  At home she is on simvastatin 80 mg daily, oxycodone 15 mg q.6 h., lamotrigine  100 mg b.i.d.,  Amitriptyline 50 mg at bedtime, omeprazole 20 mg daily, Cymbalta 60 mg daily,  Coumadin 5 mg 4 times a week and vitamin D 50,000 units every week.  Methocarbamol 500 mg q.6 h., lorazepam 1 mg t.i.d., prednisone 15 mg daily,  although the patient is not taking this and iron and multiple vitamins.    PAST MEDICAL HISTORY AND ILLNESSES:     Pulmonary embolus and DVT as indicated above.  She has a seizure disorder as indicated above and a history of stroke,  although the MRI scan did not show any evidence of a stroke.  It is possible  that the seizure disorder is due to her venous anomaly.  She has a history of  hyperlipidemia, hypertension, respiratory failure, chronic pain with  fibromyalgia, atherosclerotic coronary artery disease with three stentings.    OPERATIONS:  Cholecystectomy, TAH and BSO at an early age, partial gastrectomy for peptic  ulcer disease, left intraocular lens implant and apparently the patient will  need a right implant in the future.    SOCIAL HISTORY:  She has a history of smoking and alcohol use but none in the past 30 years.    REVIEW OF SYSTEMS:  The patient reports just chills for the past 3 to 4 months.  She has a  rheumatologist and apparently has what sounds like Raynaud's phenomena with  her right 4th digit getting very cold and having bluish color change.  This  may also happen in the feet.  She reports weight loss.  She says her standard  weight is 165 to 175 pounds.  She now weighs 130.  She says her appetite is  poor.  She has had some nausea and vomiting when she gets vertiginous, but  does not have this at home.  She does have occasional problems with abdominal  discomfort.  She has a history of respiratory problems and is on CPAP.  Denies  productive cough.  She has some episodes of chest pain at home which are  treated with nitroglycerin.  She denies any urinary symptoms, although she  does have a urinary tract infection.    FAMILY HISTORY:  Her father froze to death.  Her mother died of an MI. She has a sister who  died in coma.    PHYSICAL EXAMINATION:  GENERAL:  The patient is a thin, chronically ill black female, somewhat  anxious but otherwise in no acute distress.  VITAL SIGNS:  Blood pressure 97/55, pulse 78, respirations 16, temperature  97.7, weight 59 kilograms.  BMI 19.9.   HEENT:  There is increased size of the right orbital fissure.  There is a  cataract in the right eye.  Tympanic membranes are clear.  NECK:  Shows decreased range of motion in all planes.  Carotids 2+ without  bruits.  No supraclavicular bruits.  Thyroid normal.  CHEST:  Clear to auscultation anteriorly.  CARDIOVASCULAR:  Regular rhythm without murmurs or  extra sounds.  ABDOMEN:  No masses, tenderness or organomegaly.  EXTREMITIES:  No rashes, arthritis, edema or significant birthmarks.    NEUROLOGICAL EXAM:  MENTAL STATUS:  The patient is anxious but alert, oriented times 3.    There is no evidence of aphasia aprosodia, apraxia or paraphasic errors of  speech.    CRANIAL NERVES:  I:  Not tested.  II:  Visual fields full to confrontation.  Disk margins could not be clearly  visualized.  No gross papilledema.  Pupils 1.5 mm, weakly reactive.  There is  heterochromia iridis.     III, IV, VI:  The patient initially reported some vertical diplopia on right  lateral gaze.  This was very irregular.  At other times she reported some  diplopia on downward gaze.  Again, this was irregular.  One time when she  reported diplopia on primary gaze, this resolved when I covered her right eye,  but did not resolve when I covered her left eye, so it may be secondary to  cataracts.  V:  Corneal sensation not tested.  Facial sensation to temperature  intact.  Masseter and pterygoid strength normal.  VII:  Face symmetric.  VIII:  Hearing normal.  IX, X:  Uvula rises in the midline on phonation.  XI:  Shrug symmetric.  XII:  Tongue protrudes in midline with no atrophy.    SENSORY:  Vibratory sensation is mildly diminished in both big toes.  Temperature is more diminished in the right leg than the left leg, but there  is no distal blunting.    MOTOR:  Symmetric strength.  Bulk is generally diminished.      REFLEXES: 1/4 in the upper extremities, 2/4 in the knees and 1/4 in the   ankles.  Plantar stimulation gives a flexor response bilaterally.    COORDINATION:  The patient has a coarse tremor involving both upper  extremities, right somewhat more than left.  This is relieved by rest more  with posture and with movement.  The patient says this is a lifelong tremor.  There is no family history.    Nylen-Barany maneuver was performed with the left ear down.  The patient  denied any problems with vertigo.  She was then sat up and did experience some  vertigo without obvious nystagmus.  She then had the right ear down and after  a delay of about 10 seconds experienced complaints of vigorous vertigo.  Again, she did not have nystagmus that I could see.  When she was sat up, she  again complained of vertigo, when I lay her back down with the right ear down  after a delay of 10 to 12 seconds she again experienced vertigo, although it  is not quite as bad as it was before.    IMPRESSION:  1.  Benign positional vertigo.  2.  Possible Graves' disease, now burned out with hypothyroidism.  This may  also account for some of her problems with imbalance.  3.  Urinary tract infection.  4.  Diplopia, this is a very irregular, difficult to localize and may be a  function of her ocular problems.    PLAN:  I performed an Epley maneuver.  The patient experienced anticipated  vertigo after the procedure, she was told not to do excessive movement of the  head and not to do any bending or turning for 24 to 48 hours.    Acetylcholine receptor and MuSK antibodies, although I suspect that her double  vision has  nothing to do with neurologic problems.  I will also check  Lamotrigine  level to make sure that she is not toxic, although on this dose I  doubt that is the case.  I will also check methylmalonic acid.  The patient  may be discharged per your plan.    The above was discussed at length with Dr. Tristan Schroeder.      ADDENDUM BY Hennie Duos, MD:     Seventy minutes spent with examination, Epley maneuver, discussion of the case  and coordination of the case with Dr. Les Pou and discussions with  patient.        ___________________  Hennie Duos MD  Dictated By:.   FS  D:09/03/2014 12:09:01  T: 09/03/2014 12:49:26  [id number]  Electronically Authenticated and Edited by:  Hennie Duos, M.D. On 09/03/2014 03:45 PM EST

## 2014-09-06 LAB — LAMOTRIGINE (LAMICTAL): Lamotrigine: 5.6 ug/mL (ref 4.0–18.0)

## 2014-09-06 LAB — METHYLMALONIC ACID: METHYLMALONIC ACID, SERUM: 272 nmol/L (ref 87–318)

## 2014-09-06 LAB — ACETYLCHOLINE BINDING AB: AChR Binding Ab, serum: <0.3 nmol/L (ref <=0.30)

## 2014-09-07 LAB — MUSK ANTIBODIES
MuSK Antibody Titer: <1:10 {titer}
Musk Antibody: NEGATIVE

## 2014-09-09 LAB — CBC WITH AUTOMATED DIFF
BASOPHILS: 1.1 % (ref 0–3)
EOSINOPHILS: 5.4 % — ABNORMAL HIGH (ref 0–5)
HCT: 31.1 % — ABNORMAL LOW (ref 37.0–50.0)
HGB: 9.2 gm/dl — ABNORMAL LOW (ref 13.0–17.2)
IMMATURE GRANULOCYTES: 0.2 % (ref 0.0–3.0)
LYMPHOCYTES: 36.4 % (ref 28–48)
MCH: 23.2 pg — ABNORMAL LOW (ref 25.4–34.6)
MCHC: 29.6 gm/dl — ABNORMAL LOW (ref 30.0–36.0)
MCV: 78.5 fL — ABNORMAL LOW (ref 80.0–98.0)
MONOCYTES: 7.6 % (ref 1–13)
MPV: 10.7 fL — ABNORMAL HIGH (ref 6.0–10.0)
NEUTROPHILS: 49.3 % (ref 34–64)
NRBC: 0 (ref 0–0)
PLATELET: 286 10*3/uL (ref 140–450)
RBC: 3.96 M/uL (ref 3.60–5.20)
RDW-SD: 53.5 — ABNORMAL HIGH (ref 36.4–46.3)
WBC: 4.5 10*3/uL (ref 4.0–11.0)

## 2014-09-09 LAB — POC CHEM8
BUN: 7 mg/dl (ref 7–25)
CALCIUM,IONIZED: 4.8 mg/dL (ref 4.40–5.40)
CO2, TOTAL: 26 mmol/L (ref 21–32)
Chloride: 104 mEq/L (ref 98–107)
Creatinine: 0.7 mg/dl (ref 0.6–1.3)
Glucose: 136 mg/dL — ABNORMAL HIGH (ref 74–106)
HCT: 31 % — ABNORMAL LOW (ref 38–45)
HGB: 10.5 gm/dl — ABNORMAL LOW (ref 12.4–17.2)
Potassium: 5.1 mEq/L — ABNORMAL HIGH (ref 3.5–4.9)
Sodium: 142 mEq/L (ref 136–145)

## 2014-09-09 LAB — POC URINE MACROSCOPIC
Bilirubin: NEGATIVE
Blood: NEGATIVE
Glucose: NEGATIVE mg/dl
Ketone: NEGATIVE mg/dl
Leukocyte Esterase: NEGATIVE
Nitrites: NEGATIVE
Protein: NEGATIVE mg/dl
Specific gravity: 1.015 (ref 1.005–1.030)
Urobilinogen: 0.2 EU/dl (ref 0.0–1.0)
pH (UA): 8.5 (ref 5–9)

## 2014-09-09 LAB — TROPONIN I: Troponin-I: <0.015 ng/ml (ref 0.00–0.09)

## 2014-09-09 LAB — PROTHROMBIN TIME + INR
INR: 1.6 — ABNORMAL HIGH (ref 0.0–1.1)
Prothrombin time: 18.1 seconds — ABNORMAL HIGH (ref 11.5–14.0)

## 2014-09-09 LAB — GLUCOSE, POC: Glucose (POC): 136 mg/dL — ABNORMAL HIGH (ref 65–105)

## 2014-09-09 NOTE — ED Provider Notes (Addendum)
[facility]  EMERGENCY DEPARTMENT TREATMENT REPORT  NAME:  Kulik, Maelle  SEX:   F  ADMIT: 09/09/2014  DOB:   1951/04/02  MR#    [id number]  ROOM:  CD01  TIME DICTATED: 07 01 PM  ACCT#  [id number]    cc: Brita Romp M.D.    I hereby certify this patient for admission based upon medical necessity as  noted below:    PRIMARY CARE PROVIDER:  Dr. Brita Romp at [facility].    TIME OF EVALUATION:  09/09/14, 1430.    CHIEF COMPLAINT:  Nausea, dizzy, unsteady, near syncope.    HISTORY OF PRESENT ILLNESS:  The patient is a 64 year old female coming in today, history of  hypothyroidism, lupus, diabetes, arthritis, coronary artery disease, prior CVA  with right-sided weakness coming in for evaluation today of dizziness, nausea,  vomiting.  All this has been present for several weeks now.  She states she  was admitted at the beginning of the month for same symptoms.  She was feeling  better upon discharge after having the Epley maneuver performed by the  neurologist while inpatient.  Had felt better up until early hours this  morning.  Approximately 2:00 a.m., she awoke feeling dizzy like things were  spinning in her head and she started having nausea and vomiting.  She vomited  multiple times and had a couple episodes of diarrhea.  She finally went to bed  and her symptoms resolved and she awoke this morning feeling much better like  her normal self; however, about 10:00 a.m. her symptoms suddenly recurred.  She states the symptoms are worse when she is up and moving around or when she  suddenly turns her head while lying flat.  She tried to go to the bathroom  earlier this afternoon and fell and she hit her head on the floor.  She denies  loss of consciousness, however.  She a chronic diplopia since her prior  admission with no change in that recently and her dizziness currently is the  same dizziness she was experiencing during the prior admission at the   beginning of the month.  She denies any change in her symptoms from then and  she does admit, however, that she is on Coumadin anticoagulated and she did  hit her head today.    REVIEW OF SYSTEMS:  CONSTITUTIONAL:  No fevers.  EYES:  Diplopia, no acute change, no other vision changes.  ENT:  No URI symptoms.  ENDOCRINE:  History of diabetes.  RESPIRATORY:  No shortness of breath or cough.  CARDIOVASCULAR:  No chest pain.  GASTROINTESTINAL:  No abdominal pain.  Positive nausea and vomiting when feels  dizzy, otherwise not vomiting, diarrhea this morning a few episodes.  No  melena, hematochezia.  No recurrent diarrhea.  GENITOURINARY:  No dysuria, frequency.  MUSCULOSKELETAL:  No joint pain or swelling.  INTEGUMENTARY:  No rashes.  NEUROLOGIC:  No headache.  She does have dizziness.  Chronic right-sided  weakness from prior CVA.  No new sensory or motor symptoms.    PAST MEDICAL HISTORY:  CVA, right-sided weakness, coronary disease, epilepsy, hypertension,  hypothyroidism, MI, stent placement, fibromyalgia, osteoarthritis,  pancreatitis, cholecystectomy, hysterectomy.    FAMILY HISTORY:  Not provided.    SOCIAL HISTORY:  No alcohol, tobacco or drug use.    ALLERGIES:   REVIEWED IN IBEX.    MEDICATIONS:  Reviewed in Ibex.    PHYSICAL EXAMINATION:  VITAL SIGNS:  Blood pressure 128/78, pulse 84,  respirations 15, temp 98.6,  sats 100% on room air.  GENERAL APPEARANCE:  Patient appears well developed and well nourished.  Appearance and behavior are age and situation appropriate.  The patient lying  in a dimly lit room flat on a stretcher.  She will ask me to turn the light  on, is kind of tearful and emotional, but not acutely distressed.  HEENT:  Eyes:  Conjunctivae clear, lids normal.  Pupils equal, symmetrical,  and normally reactive.  Extraocular motions intact and equal bilaterally, some  mild nystagmus horizontally, not vertically.  Ears/Nose:  Hearing is grossly   intact to voice.  Internal and external examinations of the ears and nose are  unremarkable.  Mucosa is slightly tacky.  Neck:  Supple, symmetrical.  Trachea  is midline.  Ears/Nose:  Hearing is grossly intact to voice.  Internal and  external examinations of the ears and nose are unremarkable.  No hemotympanum  or battle sign.    RESPIRATORY:  Clear and equal breath sounds.  No respiratory distress,  tachypnea, or accessory muscle use.    CARDIOVASCULAR:  Heart regular, without murmurs, gallops, rubs, or thrills.  DP pulses 2+ and equal bilaterally.  No peripheral edema or significant  varicosities.  Calves soft and nontender.  CHEST:  Chest symmetrical without masses or tenderness.  GASTROINTESTINAL:  Abdomen soft, nontender to palpation.  No guarding or  rebound noted.  MUSCULOSKELETAL:  She can move all 4 extremities.  She is weaker on the right  side due to prior CVA years ago.  Denies any acute change in that.  SKIN:  Warm and dry.  No rashes or signs of head or neck trauma.  NECK:  Supple, symmetrical.  Trachea is midline.  No midline pain to palpation  or restriction of range of motion of the neck.   NEUROLOGICALLY:  She is alert and oriented times 3 with intact sensation and  motor strength in all her extremities.  She is weaker on the right as stated  with decreased sensation, which is chronic from prior CVA.  No acute change.  No facial asymmetry or dysarthria.  No acute ataxia on finger-to-nose.    INITIAL ASSESSMENT AND MANAGEMENT PLAN:  This is a 64 year old female coming in with recurrent vertiginous symptoms.  I  looked up her prior discharge assessment and neurology consult.  Dr. Drue Second,  the neurologist felt as if her symptoms were attributable to benign positional  vertigo.  I think that is what she is experiencing recurrently here today;  however, she did fall this morning during the last episode and hit her head in  the bathroom and she is anticoagulated on Coumadin, which would warrant a PT   INR, head CT.  Will also check some labs, urine, EKG and troponin.  The  patient will remain on cardiac monitor for observation.  Pending workup   results.    CONTINUATION BY Imogene Burn, MD:     DIAGNOSTIC INTERPRETATION:  CT scan of the head is read negative acute.  INR subtherapeutic at 1.6.  Troponin is normal.  CBC shows her normal anemia at 9.2 and 31.1.  Urinalysis  is negative.  Chem 8 shows elevated glucose of 136 and minimally elevated  potassium of 5.1.  EKG showed normal sinus rhythm, no ischemic changes.      COURSE IN EMERGENCY DEPARTMENT:  The patient received a 500 mL saline bolus for hydration, Zofran 4 mg, Valium  5 mg IV for her dysequilibrium.  We tried  to ambulate her and she still felt  like she was going to fall.  I am concerned about sending her home like that,  so I contacted Dr. Romie MinusSupplee.  In conjunction with the care managers, they felt  that she could be placed in observation status, since she was just in the  hospital 01/01 to 09/03/14, and they will go from there.    DIAGNOSES:  1.  Dehydration.  2.  Dysequilibrium.    DISPOSITION:  The patient is assigned to hospital observation telemetry in stable condition.      ___________________  Imogene Burnichard Mileah Hemmer M.D.  Dictated By: Bettey MareShannon R. Earlene PlaterWallace, PA-C    My signature above authenticates this document and my orders, the final  diagnosis (es), discharge prescription (s), and instructions in the [facility]  Pul[facility]ck record.  Nursing notes have been reviewed by the physician/mid-level provider.    If you have any questions please contact [phone].    MR  D:09/09/2014 19:01:31  T: 09/09/2014 20:15:53  [id number]  Electronically Authenticated by:  Ferdinand Langoichard L. Cliff Damiani, M.D. On 09/11/2014 11:02 AM EST

## 2014-09-10 LAB — PROTHROMBIN TIME + INR
INR: 1.4 — ABNORMAL HIGH (ref 0.0–1.1)
Prothrombin time: 16.4 seconds — ABNORMAL HIGH (ref 11.5–14.0)

## 2014-09-10 LAB — GLUCOSE, POC
Glucose (POC): 289 mg/dL — ABNORMAL HIGH (ref 65–105)
Glucose (POC): 99 mg/dL (ref 65–105)
Glucose (POC): 184 mg/dL — ABNORMAL HIGH (ref 65–105)
Glucose (POC): 112 mg/dL — ABNORMAL HIGH (ref 65–105)

## 2014-10-28 LAB — METABOLIC PANEL, BASIC
BUN: 8 mg/dl (ref 7–25)
CO2: 23 mEq/L (ref 21–32)
Calcium: 8.8 mg/dl (ref 8.5–10.1)
Chloride: 107 mEq/L (ref 98–107)
Creatinine: 0.8 mg/dl (ref 0.6–1.3)
GFR est AA: >60
GFR est non-AA: >60
Glucose: 130 mg/dl — ABNORMAL HIGH (ref 74–106)
Potassium: 3.6 mEq/L (ref 3.5–5.1)
Sodium: 137 mEq/L (ref 136–145)

## 2014-10-28 LAB — CBC WITH AUTOMATED DIFF
BASOPHILS: 0.5 % (ref 0–3)
EOSINOPHILS: 0.4 % (ref 0–5)
HCT: 27.1 % — ABNORMAL LOW (ref 37.0–50.0)
HGB: 8.2 gm/dl — ABNORMAL LOW (ref 13.0–17.2)
IMMATURE GRANULOCYTES: 0.5 % (ref 0.0–3.0)
LYMPHOCYTES: 14.4 % — ABNORMAL LOW (ref 28–48)
MCH: 21.9 pg — ABNORMAL LOW (ref 25.4–34.6)
MCHC: 30.3 gm/dl (ref 30.0–36.0)
MCV: 72.3 fL — ABNORMAL LOW (ref 80.0–98.0)
MONOCYTES: 5.1 % (ref 1–13)
MPV: 9.9 fL (ref 6.0–10.0)
NEUTROPHILS: 79.1 % — ABNORMAL HIGH (ref 34–64)
NRBC: 0 (ref 0–0)
PLATELET: 439 10*3/uL (ref 140–450)
RBC: 3.75 M/uL (ref 3.60–5.20)
RDW-SD: 49.2 — ABNORMAL HIGH (ref 36.4–46.3)
WBC: 11.5 10*3/uL — ABNORMAL HIGH (ref 4.0–11.0)

## 2014-10-28 LAB — PROTHROMBIN TIME + INR
INR: 4.6 — CR (ref 0.0–1.1)
Prothrombin time: 41.5 seconds — ABNORMAL HIGH (ref 11.5–14.0)

## 2014-10-28 LAB — TROPONIN I: Troponin-I: <0.015 ng/ml (ref 0.00–0.09)

## 2014-10-28 NOTE — ED Provider Notes (Signed)
[facility]  EMERGENCY DEPARTMENT TREATMENT REPORT  NAME:  Milanes, Melady  SEX:   F  ADMIT: 10/28/2014  DOB:   Jan 20, 1951  MR#    [id number]  ROOM:    TIME DICTATED: 04 51 PM  ACCT#  [id number]    cc: Charlett Nose MD    TIME OF SERVICE:  1450    CHIEF COMPLAINT:  Seizure.    HISTORY OF PRESENT ILLNESS:  The patient is a 64 year old female brought here by her daughter after  displaying seizure-like activity this afternoon.  According to the patient's  daughter, she had shaking of both the upper and lower extremities and seemed  in a daze.  This was in the car on the way here.  The patient had similar  activity yesterday when they were walking home.  During the shaking she was  unsteady on her feet but was able to continue ambulating with the assistance  of her daughter.  She did not hit her head or lose consciousness.  The patient  states she does have a seizure history and was previously on Lamictal.  Also  of concern, she states that she has had shortness of breath with minimal  exertion over the last 2 days.  She has had a cough but no hemoptysis.  No leg  pain or swelling.  Her cough is nonproductive of sputum and has been  associated with posttussive vomiting times 3.  She has also had several  episodes of loose stools.  No fever or chills.  She denies any chest pain or  shortness of breath at rest.    REVIEW OF SYSTEMS:  CONSTITUTIONAL:  No fever or chills.  ENT:  No sore throat.  Positive nasal congestion.  HEMATOLOGIC:  No hemoptysis, no gross blood in vomit or stool.  RESPIRATORY:  Positive exertional shortness of breath.  CARDIOVASCULAR:  No chest pain.  GASTROINTESTINAL:  Posttussive vomiting, diarrhea.  GENITOURINARY:  No dysuria, frequency or urgency.  MUSCULOSKELETAL:  No joint pain or swelling.  INTEGUMENTARY:  No rashes.  NEUROLOGIC:  Seizure-like activity.    PAST MEDICAL HISTORY:  Epilepsy, coronary artery disease, diabetes, pancreatitis, fibromyalgia,  hypertension.     PAST SURGICAL HISTORY:  Cholecystectomy, hysterectomy, coronary stent placement.    PSYCHIATRIC HISTORY:  None.    SOCIAL HISTORY:  The patient denies tobacco, alcohol and drug use.    CURRENT MEDICATIONS:    Reviewed in Ibex.    ALLERGIES:  REVIEWED IN IBEX.    PHYSICAL EXAMINATION:     GENERAL APPEARANCE:  Patient appears well developed and well nourished.  Appearance and behavior are age and situation appropriate.    HEENT:  Eyes:  Conjunctivae clear, lids normal.  Pupils are PERRL.  Mouth/Throat:  Surfaces of the pharynx, palate, and tongue are pink, moist,  and without lesions.  Teeth and gums unremarkable.  RESPIRATORY:  Clear and equal breath sounds.  No respiratory distress,  tachypnea, or accessory muscle use.  No wheezes or rhonchi.  CARDIOVASCULAR:  Heart regular rate and rhythm.  VASCULAR:  Calves are soft and nontender.  GASTROINTESTINAL:  Abdomen soft, no focal areas of tenderness.  No rigidity,  guarding or peritoneal signs.  SKIN:  Warm and dry.  NEUROLOGIC:  The patient is alert and oriented.  She does have tremor of her  bilateral upper extremities when performing finger-to-nose testing.  She  additionally has weakness of the right lower extremity compared to the left,  which she states is chronic for her  status post stroke in 2009.  Cranial  nerves II-XII grossly intact.      INITIAL ASSESSMENT AND MANAGEMENT PLAN:    A patient with seizure-like activity, 1 episode yesterday and 1 today, as well  as cough post-tussive vomiting, exertional shortness of breath.  Obtain basic  labs as well as chest x-ray, troponin based on exertional shortness of breath  and cardiac history.  Will also obtain EKG, PT/INR, CT scan of the head.    CONTINUATION BY MAE SHAW, PA-C:    TIME:  1450.   CHIEF COMPLAINT:  Seizures.    DIAGNOSTIC INTERPRETATIONS:  Troponin was negative.  EKG without acute changes.  BMP unremarkable.  CBC:  White blood cell count 11.5, hemoglobin 8.2, hematocrit 27.1.  INR is   supratherapeutic at 4.6.    COURSE IN THE EMERGENCY DEPARTMENT:  The patient remained stable throughout her stay.  She was given a liter of IV  fluids, Zofran for nausea and morphine for pain.  She stated she felt  significantly improved after pain control and IV fluid hydration.  The patient  was also evaluated by Dr. Jama FlavorsManolio who did not believe she required any further  workup at this time.      ADDITIONAL PHYSICAL EXAMINATION:   GASTROINTESTINAL:  Rectal:  Stool is brown and guaiac negative.    DIAGNOSES:  1.  Nausea and vomiting.  2.  Anemia.  3.  Diarrhea.  4.  Questionable seizure.  5.  Supratherapeutic INR.    DISPOSITION:  The patient discharged to home in stable condition.  She is to hold her  Coumadin dose for tomorrow and [facility]  [facility] reassessment of INR.  She is also to follow up on her anemia.  She  is given a prescription for Tussionex to take as needed for her cough, Zofran  as needed for nausea.  She is aware she may return to the Emergency Department  if there is worsening of her symptoms at any time.  The patient was personally  evaluated by myself and Dr. Jama FlavorsManolio, who agrees with the above assessment and  plan.      ___________________  Imogene Burnichard Matteo Banke M.D.  Dictated By: Debbora PrestoMae M. Clelia CroftShaw, PA-C    My signature above authenticates this document and my orders, the final  diagnosis (es), discharge prescription (s), and instructions in the [facility]  Pul[facility]ck record.  Nursing notes have been reviewed by the physician/mid-level provider.    If you have any questions please contact [phone].    MLT  D:10/28/2014 16:51:22  T: 10/28/2014 19:17:45  [id number]  Electronically Authenticated by:  Ferdinand Langoichard L. Tavaria Mackins, M.D. On 11/02/2014 11:56 AM EST

## 2014-10-31 LAB — EKG, 12 LEAD, INITIAL
Atrial Rate: 107 {beats}/min
Calculated P Axis: 55 degrees
Calculated R Axis: 69 degrees
Calculated T Axis: -26 degrees
P-R Interval: 128 ms
Q-T Interval: 318 ms
QRS Duration: 92 ms
QTC Calculation (Bezet): 424 ms
Ventricular Rate: 107 {beats}/min

## 2014-11-26 ENCOUNTER — Emergency Department: Admit: 2014-11-26 | Payer: MEDICAID | Primary: Internal Medicine

## 2014-11-26 ENCOUNTER — Inpatient Hospital Stay
Admit: 2014-11-26 | Discharge: 2014-11-29 | Disposition: A | Discharge status: Home / Self Care | Payer: MEDICAID | Attending: Internal Medicine | Admitting: Internal Medicine

## 2014-11-26 DIAGNOSIS — K922 Gastrointestinal hemorrhage, unspecified: Principal | ICD-10-CM

## 2014-11-26 LAB — CBC WITH AUTOMATED DIFF
BASOPHILS: 0.7 % (ref 0–3)
EOSINOPHILS: 3.5 % (ref 0–5)
HCT: 22.1 % — ABNORMAL LOW (ref 37.0–50.0)
HGB: 6.3 gm/dl — CL (ref 13.0–17.2)
IMMATURE GRANULOCYTES: 0.2 % (ref 0.0–3.0)
LYMPHOCYTES: 32.3 % (ref 28–48)
MCH: 21 pg — ABNORMAL LOW (ref 25.4–34.6)
MCHC: 28.5 gm/dl — ABNORMAL LOW (ref 30.0–36.0)
MCV: 73.7 fL — ABNORMAL LOW (ref 80.0–98.0)
MONOCYTES: 7.5 % (ref 1–13)
MPV: 9.5 fL (ref 6.0–10.0)
NEUTROPHILS: 55.8 % (ref 34–64)
NRBC: 0 (ref 0–0)
PLATELET: 281 10*3/uL (ref 140–450)
RBC: 3 M/uL — ABNORMAL LOW (ref 3.60–5.20)
RDW-SD: 50.3 — ABNORMAL HIGH (ref 36.4–46.3)
WBC: 5.7 10*3/uL (ref 4.0–11.0)

## 2014-11-26 LAB — POC CHEM8
BUN: 8 mg/dl (ref 7–25)
CALCIUM,IONIZED: 4.5 mg/dL (ref 4.40–5.40)
CO2, TOTAL: 22 mmol/L (ref 21–32)
Chloride: 105 mEq/L (ref 98–107)
Creatinine: 0.7 mg/dl (ref 0.6–1.3)
Glucose: 81 mg/dL (ref 74–106)
HCT: 22 % — ABNORMAL LOW (ref 38–45)
HGB: 7.5 gm/dl — ABNORMAL LOW (ref 12.4–17.2)
Potassium: 4.1 mEq/L (ref 3.5–4.9)
Sodium: 141 mEq/L (ref 136–145)

## 2014-11-26 LAB — PROTHROMBIN TIME + INR
INR: 2 — ABNORMAL HIGH (ref 0.0–1.1)
Prothrombin time: 22.1 seconds — ABNORMAL HIGH (ref 11.5–14.0)

## 2014-11-26 LAB — POC TROPONIN: Troponin-I: 0 ng/ml (ref 0.00–0.07)

## 2014-11-26 MED ORDER — SODIUM CHLORIDE 0.9 % IV
40 mg | INTRAVENOUS | Status: DC
Start: 2014-11-26 — End: 2014-11-27
  Administered 2014-11-27 (×2): via INTRAVENOUS

## 2014-11-26 MED ORDER — MORPHINE 4 MG/ML SYRINGE
4 mg/mL | INTRAMUSCULAR | Status: AC
Start: 2014-11-26 — End: 2014-11-26
  Administered 2014-11-26: 20:00:00 via INTRAVENOUS

## 2014-11-26 MED ORDER — SODIUM CHLORIDE 0.9 % INJECTION
40 mg | Freq: Once | INTRAMUSCULAR | Status: AC
Start: 2014-11-26 — End: 2014-11-26
  Administered 2014-11-27: 02:00:00 via INTRAVENOUS

## 2014-11-26 MED ORDER — SODIUM CHLORIDE 0.9 % INJECTION
40 mg | INTRAMUSCULAR | Status: DC
Start: 2014-11-26 — End: 2014-11-27

## 2014-11-26 MED ORDER — ONDANSETRON (PF) 4 MG/2 ML INJECTION
4 mg/2 mL | Freq: Four times a day (QID) | INTRAMUSCULAR | Status: DC | PRN
Start: 2014-11-26 — End: 2014-11-29
  Administered 2014-11-27: 20:00:00 via INTRAVENOUS

## 2014-11-26 MED ORDER — ONDANSETRON (PF) 4 MG/2 ML INJECTION
4 mg/2 mL | Freq: Once | INTRAMUSCULAR | Status: AC
Start: 2014-11-26 — End: 2014-11-26
  Administered 2014-11-26: 20:00:00 via INTRAVENOUS

## 2014-11-26 MED ORDER — MORPHINE 4 MG/ML SYRINGE
4 mg/mL | INTRAMUSCULAR | Status: AC
Start: 2014-11-26 — End: 2014-11-26
  Administered 2014-11-26: 23:00:00 via INTRAVENOUS

## 2014-11-26 MED FILL — ONDANSETRON (PF) 4 MG/2 ML INJECTION: 4 mg/2 mL | INTRAMUSCULAR | Qty: 2

## 2014-11-26 MED FILL — MORPHINE 4 MG/ML SYRINGE: 4 mg/mL | INTRAMUSCULAR | Qty: 1

## 2014-11-26 NOTE — ED Notes (Signed)
TRANSFER - OUT REPORT:    Verbal report given to joy(name) on [name] CourseVeronica E [name]  being transferred to 5210(unit) for routine progression of care       Report consisted of patient???s Situation, Background, Assessment and   Recommendations(SBAR).     Information from the following report(s) SBAR and Recent Results was reviewed with the receiving nurse.    Lines:   Peripheral IV 11/26/14 Left Basilic (Active)        Opportunity for questions and clarification was provided.      Patient transported with:   [employer]

## 2014-11-26 NOTE — Other (Signed)
----------  DocumentID:   [id number]------------------------------------------------              [facility]                       Patient Education Report         Name: [name], [name]                  Date: 11/26/2014    MRN: [id number]                    Time: 8:27:32 PM         Patient ordered video: Patient Safety: Stay Safe While you are in the   Hospital    from [id number] via phone number: [phone] at 8:27:32 PM

## 2014-11-26 NOTE — ED Notes (Signed)
Pt was seen by dr Wenda Lowmatriano

## 2014-11-26 NOTE — ED Provider Notes (Signed)
[facility]  EMERGENCY DEPARTMENT TREATMENT REPORT  NAME:  [name] [name], [name] [name]  SEX:   F  ADMIT: 11/26/2014  DOB:   [birth year]  MR#    [id number]  ROOM:  5210  TIME DICTATED: 08 50 PM  ACCT#  [id number]        I hereby certify this patient for admission based upon medical necessity as   noted below:    TIME OF EVALUATION:   1506.     CHIEF COMPLAINT:  Fall, back and neck pain, chest pain.    HISTORY OF PRESENT ILLNESS:  The patient is a 64 year old female presenting after falling in the home.    Prior to arrival, the patient states she was cooking [employer] and she   began to feel weak and collapsed to the ground.  She is unsure whether she   passed out or fell due to weakness.  She does note over the past several days,   she has had exertional shortness of breath.  When medics arrived, she also   noted left-sided chest pain, pressure like in nature.  A family member had   given one of her home nitroglycerin, which provided some relief.  The second   nitroglycerin by medic resolved her chest pain.  She describes that pain as   pressure-like in nature, dull, left-sided, nonradiating, no associated   symptoms.  She states when she fell, she did hit her head and is complaining   of neck pain after the fall.  She denies any abdominal pain, fever, chills,   recent illnesses, recent medication change.  She does have a seizure history   but states she does not believe she had a seizure associated with this   episode.    REVIEW OF SYSTEMS:  CONSTITUTIONAL:  No fever, chills, or weight loss.    EYES:  No visual symptoms.    ENT:  No sore throat, runny nose, or other upper respiratory infection   symptoms.    HEMATOLOGIC:  No hemoptysis.  RESPIRATORY:  Positive exertional shortness of breath.  CARDIOVASCULAR:  Chest pain as above.  GASTROINTESTINAL:  No vomiting, diarrhea, or abdominal pain.    GENITOURINARY:  No dysuria, frequency, or urgency.    MUSCULOSKELETAL:  Positive neck pain.   INTEGUMENTARY:  No lacerations or abrasions.  NEUROLOGIC:  No headaches.  Positive syncope.    PAST MEDICAL HISTORY:  Seizures, diabetes, stroke, DVT, chronic pain, fibromyalgia, hyperlipidemia,   hypertension, hypothyroidism, seizures.    PAST SURGICAL HISTORY:  Hysterectomy, cholecystectomy, colectomy.    SOCIAL HISTORY:  The patient is a previous cigarette smoker.  Denies alcohol and drug use.    CURRENT MEDICATIONS AND ALLERGIES:  SEE IN EPIC.    PHYSICAL EXAMINATION:  VITAL SIGNS:  Blood pressure is 115/72, pulse 84, respirations 20, temperature   98.5.  O2 sat 100%.  GENERAL APPEARANCE:  The patient appears well developed and well nourished.     Appearance and behavior are age and situation appropriate.    EYES:  Conjunctivae clear.  Lids normal.  RESPIRATORY:  Clear and equal breath sounds.  No respiratory distress,   tachypnea, or accessory muscle use.    HEART:  Regular, without significant murmurs, gallops, rubs, or thrills.  PMI   not displaced.    VASCULAR:  Calves soft, nontender.    MUSCULOSKELETAL:  Positive tenderness to palpation in the cervical spine.    There is no overlying bruises, no palpable step-off.  No swelling.  She has  tenderness to palpation of paraspinal muscles in thoracic and lumbar spine.    Again, no overlying skin changes or bruising.  She has full range motion upper   and lower extremity pain with range of motion.  SKIN:  Warm and dry, no lacerations, abrasions or bruising.  NEUROLOGIC:  Motor strength equal and symmetric.    INITIAL ASSESSMENT AND MANAGEMENT PLAN:  The patient with cardiac history, who syncopized prior to arrival, had   associated chest pain.  Will obtain basic labs, cardiac workup as well with   EKG, troponin, chest x-ray.  We will obtain CT scan of the head and neck and   INR as patient is on Coumadin.  We will give the patient morphine for pain.    CONTINUATION BY Imogene BurnICHARD Shahir Karen, MD:     DIAGNOSTIC INTERPRETATION:   The patient's hemoglobin and hematocrit is severely low at 6.3 and 22.1.  Most   recent was 02/27 of this year with 8.2 and 27.1.  The patient does not know   why she runs a low hemoglobin  although her indices look like an iron   deficiency anemia picture. The patient is on Coumadin, INR is barely   therapeutic at 2.  BMP is normal and CAT scan of the head and neck are both   read negative by radiology.  Chest x-ray is read COPD, but otherwise negative.       COURSE IN EMERGENCY DEPARTMENT:  When the patient's hemoglobin and hematocrit returned low, I did a digital   rectal exam with a nurse and there was trace heme-positive brown stool.  No   melena.  The patient also had a 12-lead EKG which was normal sinus rhythm, no   ischemic changes.  She was given Zofran and morphine for her back pain, 4 mg   each IV.  I spoke with Dr. Wenda LowMatriano for admission.  He asked that I type and   cross for 2 units, give her the initial dose of Protonix and consult Dr.   Federico FlakeWaldholtz.  I spoke with Dr. Federico FlakeWaldholtz.  He will see the patient in the   morning.  We decided together that we would not need to reverse the patient's   Coumadin.    DIAGNOSES:  1.  Acute gastrointestinal bleed.  2.  Anemia secondary to iron deficiency.  3.  Syncope and collapse.  4.  Chest pain.    DISPOSITION:  The patient is admitted in stable condition to telemetry.      ___________________  Imogene Burnichard Jabron Weese M.D.  Dictated By: Debbora PrestoMae M. Clelia CroftShaw, PA-C    My signature above authenticates this document and my orders, the final  diagnosis (es), discharge prescription (s), and instructions in the [facility]   Pul[facility]ck record.  Nursing notes have been reviewed by the physician/mid-level provider.    If you have any questions please contact [phone].    AK  D:11/26/2014 20:50:51  T: 11/26/2014 21:11:29  [id number]

## 2014-11-26 NOTE — ED Notes (Signed)
Per medic pt had 3 nitro S,L BS 74

## 2014-11-26 NOTE — Other (Signed)
TRANSFER - IN REPORT:    Verbal report received from [place] RN(name) on [name] [name]  being received from ED(unit) for routine progression of care      Report consisted of patient???s Situation, Background, Assessment and   Recommendations(SBAR).     Information from the following report(s) SBAR, Kardex, Procedure Summary and Recent Results was reviewed with the receiving nurse.    Opportunity for questions and clarification was provided.      Assessment completed upon patient???s arrival to unit and care assumed.

## 2014-11-26 NOTE — H&P (Signed)
[facility]  Stat History and Physical  NAME:  [name] [name]  SEX:   F  ADMIT: 11/26/2014  DOB:May 17, 1951  MR#    [id number]  ROOM:  5210  ACCT#  [id number]    I hereby certify this patient for admission based upon medical necessity as   noted below:    <    CHIEF COMPLAINT:  Shortness of breath and generalized weakness.    HISTORY OF PRESENT ILLNESS:  The patient is a very pleasant 64 year old female with history of gastric   ulcer status post partial gastrectomy, history of PE, DVT, seizure disorder   who comes in because of shortness of breath and generalized weakness.  The   patient states that over the past 3 weeks she has had progressive generalized   weakness, shortness of breath on exertion.  Earlier today she had   lightheadedness, increasing shortness of breath and chest pain.  She decided   to go to the ER.  En route, she was given sublingual nitro.   When she arrived   in the ER, she was noted to have a hemoglobin of 6.3 and per ER report, they   checked her stool and it was positive for hemoccult blood. This result is    not available to me at this time.  The patient denies    any vomiting blood.  She states that she does not check her stool if there is   any blood or any tarry stool.  She denies any abdominal pain.    PAST MEDICAL HISTORY:  CVA, DVT in 2011.  PE in 2009,   gastric ulcer status post partial   gastrectomy, coronary artery disease status post 3 stents, seizure disorder,   chronic pain, diabetes mellitus, fibromyalgia, hypertension, vertigo,   hypothyroidism, anemia.    PAST SURGICAL HISTORY:  Includes cholecystectomy, hysterectomy, partial gastrectomy, lens implants.    ALLERGIES:  ASPIRIN, OPANA, TYLENOL.    HOME MEDICATIONS:  Family to bring the patient's home medication list, but per patient, she   states she takes Coumadin, Lamictal and lorazepam and does not recall her   other meds which will be brought by her daughter.    SOCIAL HISTORY:   No alcohol or tobacco use.  Lives with daughter.    FAMILY HISTORY:  Noncontributory.    REVIEW OF SYSTEMS:  GENERAL:  No fever or night sweats.  HEENT:  No nasal congestion or nosebleed.  CARDIOVASCULAR:  As stated in HPI.  RESPIRATORY:  As stated in HPI.  GASTROINTESTINAL:  As stated in HPI.  GENITOURINARY:  Occasional difficulty urinating.  NEUROLOGIC:  No focal weakness or numbness.  MUSCULOSKELETAL:  Joint pains.  HEMATOLOGIC:  Stated in HPI.  SKIN:  No itching.    PHYSICAL EXAMINATION:  GENERAL:  The patient in bed.  VITAL SIGNS:  Blood pressure 126/75, heart rate 70, respiratory rate 16,   temperature 98.1 degrees Fahrenheit, oxygen saturation 100% on room air.  HEENT:  Normocephalic, atraumatic.  NECK:  Supple.  LUNGS:  Clear.  CARDIOVASCULAR:  Regular rhythm.  ABDOMEN:  Soft, nontender.  EXTREMITIES:  No edema.  NEUROLOGIC:  Awake, alert, oriented times 3, follows commands, moves all   extremities.  SKIN:  Pale.    LABORATORY EXAMINATION:  WBC 5.7, hemoglobin 6.3, hematocrit 22, platelet 281.  INR 2.  Sodium 141,   potassium 4.1, chloride 105, bicarbonate 22, glucose 81, BUN 8, creatinine   0.7.  Head CT showed \\"mild periventricular microvascular ischemia.\\"  Cervical spine CT showed \\"no fractures cervical spine.\\"  Chest x-ray showed   \\"COPD\\" per radiologist read.  EKG showed normal sinus rhythm, QTC 420   milliseconds.    ASSESSMENT AND PLAN:  The patient is a very pleasant 64 year old female who comes in with   progressive generalized weakness and shortness of breath.  1.  Acute on chronic symptomatic anemia, suspect secondary to gastrointestinal   bleed.  Type and crossmatch packed RBCs.  The patient will be transfused 2   units packed RBCs.  Monitor hemoglobin and hematocrit;   maintain hemoglobin above 7.  GI consultation initiated from ER.  Appreciate   Dr. Zenaida NieceWaldholtz's recommendations.  The patient will be placed n.p.o. for now,    IV fluids, maintain 2 large bore IVs at all times, Protonix.  Coumadin will be   placed on hold for now.  Per GI recommendations no need for reversal of   anticoagulation at this time.  Monitor INR.  Much appreciate GI    recommendations. Check fecal occult blood test.  2.  History of deep venous thrombosis in 2011, pulmonary embolus in 2009.    Monitor INR.  Coumadin placed on hold.  May resume when okay from GI   standpoint.  3.  Mild hypoglycemia.  Will check hemoglobin A1c.  Will provide IV fluids   with dextrose, Accu-Cheks, hypoglycemic protocol.  4.  Seizure disorder.  Continue home medications.  5.  Deep venous thrombosis prophylaxis with sequential compression devices.  6.  Rule out UTI. Check UA   Further recommendations based on clinical course.  Discussed with patient   current assessment and plan, answered all her questions.    Thank you very much for allowing me to participate in the care of this very   pleasant patient.      ___________________  Knox RoyaltyJOHN P Maisa Bedingfield MD  Dictated By: .   CDC  D:11/26/2014 20:41:54  T: 11/26/2014 21:33:17  [id number]

## 2014-11-27 LAB — EKG, 12 LEAD, INITIAL
Atrial Rate: 82 {beats}/min
Calculated P Axis: 78 degrees
Calculated R Axis: 73 degrees
Calculated T Axis: 61 degrees
Diagnosis: NORMAL
P-R Interval: 148 ms
Q-T Interval: 360 ms
QRS Duration: 90 ms
QTC Calculation (Bezet): 420 ms
Ventricular Rate: 82 {beats}/min

## 2014-11-27 LAB — GLUCOSE, POC
Glucose (POC): 100 mg/dL (ref 65–105)
Glucose (POC): 103 mg/dL (ref 65–105)
Glucose (POC): 239 mg/dL — ABNORMAL HIGH (ref 65–105)
Glucose (POC): 104 mg/dL (ref 65–105)

## 2014-11-27 LAB — URINALYSIS W/ RFLX MICROSCOPIC
Bilirubin: NEGATIVE
Blood: NEGATIVE
Glucose: NEGATIVE mg/dl
Ketone: NEGATIVE mg/dl
Nitrites: NEGATIVE
Protein: NEGATIVE mg/dl
Specific gravity: 1.01 (ref 1.005–1.030)
Urobilinogen: 0.2 mg/dl (ref 0.0–1.0)
pH (UA): 8 (ref 5.0–9.0)

## 2014-11-27 LAB — TYPE + CROSSMATCH
Blood type code: 6200
Blood type code: 6200
Issue date/time: 201603272304
Issue date/time: 201603280157
Specimen expiration date: 201603302359
Specimen expiration date: 201603302359
Status info.: TRANSFUSED
Status info.: TRANSFUSED

## 2014-11-27 LAB — POC URINE MICROSCOPIC

## 2014-11-27 LAB — HGB & HCT
HCT: 30 % — ABNORMAL LOW (ref 37.0–50.0)
HCT: 28.7 % — ABNORMAL LOW (ref 37.0–50.0)
HCT: 29.5 % — ABNORMAL LOW (ref 37.0–50.0)
HGB: 9 gm/dl — ABNORMAL LOW (ref 13.0–17.2)
HGB: 8.7 gm/dl — ABNORMAL LOW (ref 13.0–17.2)
HGB: 8.5 gm/dl — ABNORMAL LOW (ref 13.0–17.2)

## 2014-11-27 LAB — PROTHROMBIN TIME + INR
INR: 2 — ABNORMAL HIGH (ref 0.0–1.1)
Prothrombin time: 21.9 seconds — ABNORMAL HIGH (ref 11.5–14.0)

## 2014-11-27 LAB — BLOOD TYPE, (ABO+RH)
ABO/Rh(D): A POS
ABO/Rh: A POS

## 2014-11-27 LAB — BLOOD TYPE CONFIRM.: ABO/Rh(D): A POS

## 2014-11-27 LAB — HEMOGLOBIN A1C WITH EAG: Hemoglobin A1c: 5.6 % (ref 4.8–6.0)

## 2014-11-27 LAB — ANTIBODY SCREEN: Antibody screen: NEGATIVE

## 2014-11-27 MED ORDER — PANTOPRAZOLE 40 MG IV SOLR
40 mg | INTRAVENOUS | Status: DC
Start: 2014-11-27 — End: 2014-11-29
  Administered 2014-11-27 – 2014-11-28 (×3): via INTRAVENOUS

## 2014-11-27 MED ORDER — DEXTROSE 5% IN NORMAL SALINE IV
INTRAVENOUS | Status: AC
Start: 2014-11-27 — End: 2014-11-27
  Administered 2014-11-27 (×2): via INTRAVENOUS

## 2014-11-27 MED ORDER — OXYCODONE 15 MG TAB
15 mg | ORAL | Status: DC | PRN
Start: 2014-11-27 — End: 2014-11-29

## 2014-11-27 MED ORDER — SODIUM CHLORIDE 0.9 % IV
40 mg | INTRAVENOUS | Status: DC
Start: 2014-11-27 — End: 2014-11-27

## 2014-11-27 MED ORDER — DEXTROSE 50% IN WATER (D50W) IV SYRG
INTRAVENOUS | Status: DC | PRN
Start: 2014-11-27 — End: 2014-11-29

## 2014-11-27 MED ORDER — ALBUTEROL SULFATE 2.5 MG/0.5 ML NEB SOLUTION
2.5 mg/0.5 mL | RESPIRATORY_TRACT | Status: DC | PRN
Start: 2014-11-27 — End: 2014-11-29

## 2014-11-27 MED ORDER — LEVOFLOXACIN IN D5W 750 MG/150 ML IV PIGGY BACK
750 mg/150 mL | INTRAVENOUS | Status: DC
Start: 2014-11-27 — End: 2014-11-29
  Administered 2014-11-27 – 2014-11-28 (×2): via INTRAVENOUS

## 2014-11-27 MED ORDER — SODIUM CHLORIDE 0.9 % INJECTION
40 mg | INTRAMUSCULAR | Status: DC
Start: 2014-11-27 — End: 2014-11-29

## 2014-11-27 MED ORDER — DIPHENHYDRAMINE HCL 50 MG/ML IJ SOLN
50 mg/mL | INTRAMUSCULAR | Status: DC | PRN
Start: 2014-11-27 — End: 2014-11-29

## 2014-11-27 MED ORDER — SODIUM CHLORIDE 0.9 % IV
INTRAVENOUS | Status: DC
Start: 2014-11-27 — End: 2014-11-27

## 2014-11-27 MED ORDER — LAMOTRIGINE 100 MG TAB
100 mg | Freq: Two times a day (BID) | ORAL | Status: DC
Start: 2014-11-27 — End: 2014-11-29
  Administered 2014-11-27 – 2014-11-29 (×5): via ORAL

## 2014-11-27 MED ORDER — OXYCODONE 5 MG TAB
5 mg | ORAL | Status: DC | PRN
Start: 2014-11-27 — End: 2014-11-29
  Administered 2014-11-27 – 2014-11-29 (×9): via ORAL

## 2014-11-27 MED ORDER — AMITRIPTYLINE 50 MG TAB
50 mg | Freq: Every evening | ORAL | Status: DC
Start: 2014-11-27 — End: 2014-11-29
  Administered 2014-11-27 – 2014-11-29 (×4): via ORAL

## 2014-11-27 MED ORDER — SALINE PERIPHERAL FLUSH PRN
INTRAMUSCULAR | Status: DC | PRN
Start: 2014-11-27 — End: 2014-11-28

## 2014-11-27 MED ORDER — GLUCAGON 1 MG INJECTION
1 mg | INTRAMUSCULAR | Status: DC | PRN
Start: 2014-11-27 — End: 2014-11-29

## 2014-11-27 MED ORDER — SODIUM CHLORIDE 0.9 % IV
INTRAVENOUS | Status: DC | PRN
Start: 2014-11-27 — End: 2014-11-28

## 2014-11-27 MED ORDER — PANTOPRAZOLE 40 MG IV SOLR
40 mg | INTRAVENOUS | Status: DC
Start: 2014-11-27 — End: 2014-11-27

## 2014-11-27 MED FILL — PROTONIX 40 MG INTRAVENOUS SOLUTION: 40 mg | INTRAVENOUS | Qty: 80

## 2014-11-27 MED FILL — ONDANSETRON (PF) 4 MG/2 ML INJECTION: 4 mg/2 mL | INTRAMUSCULAR | Qty: 2

## 2014-11-27 MED FILL — SODIUM CHLORIDE 0.9 % IV: INTRAVENOUS | Qty: 250

## 2014-11-27 MED FILL — DEXTROSE 5% IN NORMAL SALINE IV: INTRAVENOUS | Qty: 1000

## 2014-11-27 MED FILL — SODIUM CHLORIDE 0.9 % IV: INTRAVENOUS | Qty: 1000

## 2014-11-27 MED FILL — OXYCODONE 5 MG TAB: 5 mg | ORAL | Qty: 1

## 2014-11-27 MED FILL — AMITRIPTYLINE 50 MG TAB: 50 mg | ORAL | Qty: 1

## 2014-11-27 MED FILL — LEVOFLOXACIN IN D5W 750 MG/150 ML IV PIGGY BACK: 750 mg/150 mL | INTRAVENOUS | Qty: 150

## 2014-11-27 MED FILL — BD POSIFLUSH NORMAL SALINE 0.9 % INJECTION SYRINGE: INTRAMUSCULAR | Qty: 10

## 2014-11-27 MED FILL — LAMOTRIGINE 100 MG TAB: 100 mg | ORAL | Qty: 1

## 2014-11-27 MED FILL — NITROSTAT 0.4 MG SUBLINGUAL TABLET: 0.4 mg | SUBLINGUAL | Qty: 1

## 2014-11-27 NOTE — Progress Notes (Signed)
DC plan--home with no needs vs home with [facility] (? PT, aide). Hx of CVA with residual weakness.     Case Management Assessment  Only CMI and DCP completed by this CM.       Preferred Language for Healthcare Related Communication     Preferred Language for Healthcare Related Communication: English   Spiritual/Ethnic/Cultural/Religious Needs that Should be Incorporated Into Your Care          FUNCTIONAL ASSESSMENT   Fall in Past 12 Months: Yes   Fall With Injury: No   Number of Falls Within 12 Months: 4       Decline in Gait/Transfer/Balance: No       Decline in Capacity to Feed/Dress/Bathe: No   Developmental Delay: No   Chewing/Swallowing Problems: No      DYSPHAGIA SCREENING                          Difficulty with Secretions     Difficulty with Secretions: No      Speech Slurred/Thick/Garbled     Speech Slurred/Thick/Garbled: No      ABUSE/NEGLECT SCREENING   Physical Abuse/Neglect: Denies   Sexual Abuse: Denies   Sexual Abuse: Denies   Other Abuse/Issues: Denies          PRIMARY DECISION MAKER   Primary Decision Maker Name: [name] [name]         [place] Decision [place] [id number]   Primary Decision Maker Relationship to Patient: Adult child                      ADVANCE CARE PLANNING (ACP) DOCUMENTS   Confirm Advance Directive: None              Suicide/Psychosocial Screening   Primary Diagnosis or Primary Complaint of an Emotional Behavior Disorder: No   Patient is Currently Experiencing Depression: No   Suicidal Ideation/Attempts: No   Homicidal Ideation/Attempts: No   Alcohol/Drug Intoxication: No   Hallucinations/Delusions: No   Pending, Active, or Temporary Detention Orders: No   Aggressive/Inappropriate Behavior: No      SAD PERSONS                                                  READMIT RISK TOOL   Support Systems: Family member(s)   Relationship with Primary Physician Group: Seen at least one time within the past 6 months   History of Falls Within Past 3 Months: No    Needs Assistance with Wound Care AND/OR Mgnt of O2, Nebulizer: No   Requires Financial, Physical and/or Educational Assistance With Medications: No   History of Mental Illness: No   Living Alone: No      CARE MANAGEMENT INTERVENTIONS   PCP Verified by CM: Yes       Palliative Care Consult: No                   Discharge Durable Medical Equipment: Yes (Has shower chair, RW and cane at home)   Physical Therapy Consult: No           Current Support Network: Lives with Caregiver   Confirm Follow Up Transport: Family       Plan discussed with Pt/Family/Caregiver: Yes   Freedom of Choice Offered: Yes  DISCHARGE LOCATION   Discharge Placement: Home ((home vs home with [facility]). FOC for [facility] if needed. Pt lives with dau, [name] [name] and plans to return home at dc. Dau assists her at times with adls/iadls as needed. Independent to BR. Hx of CVA with some residual weakness.  Denies need for SW f/u. )

## 2014-11-27 NOTE — Consults (Signed)
A member of [facility]    Consult Note    IMPRESSION:     1. Anemia r/o PUD, erosive esophagitis/gastritis, neoplasm. Hbg currently 9. Patient reports she had a colon[place] [place] [facility] [place] 2 [place]; polyp removed, internal hemorrhoids.   2. Epigastric pain  3. Hemoccult positive brown stool on rectal exam, no melena.   4. Weight loss - 4 dress sizes since January.   5. Coumadin therapy for Hx of PE/DVT/cardiac stents, currently on hold -  INR 2.0.   6. 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   7. Co morbidities: Lupus, DM II, seizure disorder, fibromyalgia, chronic pain, HTN, CABG w/ stents.     RECOMMENDATIONS:     ?? OK for full diabetic liquids until midnight.  ?? EGD tomorrow with MAC by Dr. [name].  ?? Recheck INR, iron panel in am.   ?? Oral Protonix 40 mg daily.       HPI:     I was asked by Dr. Marcelle OverlieMac Gregor to evaluate [name] [name] for anemia.  She is a 64 y.o. female admitted on 11/26/2014 for GI bleed. Patient reports progressively worsening DOE x 2 weeks. She was preparing Easter dinner, became dizzy, and fell. She has lost "4 dress sizes" since January, states "can't seen to eat enough". [facility] ago [facility] by Dr. Floyde ParkinsJohn Smith; she reports polyps removed, and internal hemorrhoids. She was on daily Prednisone up until 1 month ago for her Lupus symptoms; has not refilled the prescription.  Patient denies dysphagia, odynophagia, NSAIDs,  hematemesis, melena, hematochezia, or recent changes in her bowel habits.     Patient Active Problem List    Diagnosis Date Noted   ??? GI bleed 11/26/2014   ??? Back pain, lumbosacral 09/11/2011   ??? Arthritis of knee 01/04/2011   ??? Encounter for long-term (current) use of other medications 01/04/2011   ??? Abdominal pain, generalized 01/04/2011   ??? 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 Lupus (HCC) 12/03/2010    ??? Osteoarthrosis, unspecified whether generalized or localized, unspecified site 12/03/2010   ??? Neuralgia, neuritis, and radiculitis, unspecified 12/03/2010   ??? Myalgia and myositis, unspecified 12/03/2010   ??? Pain in limb 12/03/2010   ??? Pain in joint, multiple sites 12/03/2010   ??? Type II or unspecified type diabetes mellitus without mention of complication, not stated as uncontrolled (HCC) 12/03/2010   ??? Rib pain 10/10/2010      Past Medical History   Diagnosis Date   ??? Seizure (HCC)    ??? Diabetes mellitus    ??? Stroke [facility](HCC)    ??? DVT (deep venous thrombosis) (HCC)    ??? Adjustment disorder with mixed anxiety and depressed mood    ??? Aneurysm of internal carotid artery    ??? Backache    ??? Cervicalgia    ??? Chronic pain syndrome    ??? Coccydynia    ??? Fibromyalgia    ??? Generalized osteoarthritis of multiple sites    ??? Hemiparesis, right (HCC)    ??? Hyperlipidemia    ??? Irregular sleep-wake rhythm    ??? Knee joint pain    ??? Lumbar spondylosis    ??? Muscle spasm    ??? Osteoporosis    ??? Pulmonary embolism (HCC)    ??? Subclinical hypothyroidism    ??? Seizures (HCC)    ??? Autoimmune  disease (HCC)    ??? Pancreatitis      Allergies   Allergen Reactions   ??? Aspirin Not Reported This Time   ??? Opana [Oxymorphone] Rash and Itching   ??? Tylenol [Acetaminophen] Not Reported This Time       Prior to Admission Medications   Prescriptions Last Dose Informant Patient Reported? Taking?   Cholecalciferol, Vitamin D3, (VITAMIN D) 1,000 unit Cap 11/26/2014 at Unknown time  Yes Yes   Sig: Take 50,000 Units by mouth every seven (7) days.   albuterol (PROVENTIL HFA) 90 mcg/Actuation inhaler 11/25/2014 at Unknown time  Yes Yes   Sig: Take 1 Puff by inhalation as needed.   albuterol-ipratropium (DUO-NEB) 0.5 mg-3 mg(2.5 mg base)/3 mL nebulizer solution 11/25/2014 at Unknown time  Yes Yes   Sig: 3 mL by Nebulization route once.   alendronate-vitamin d3 70-2,800 mg-unit per tablet   Yes Yes   Sig: Take 1 Tab by mouth every seven (7) days.    amitriptyline (ELAVIL) 75 mg tablet 11/26/2014 at Unknown time  No Yes   Sig: Take 1 Tab by mouth nightly.   Patient taking differently: Take 50 mg by mouth nightly.   ferrous sulfate 325 mg (65 mg iron) CpER 11/26/2014 at Unknown time  Yes Yes   Sig: Take  by mouth.   hydroxychloroquine (PLAQUENIL) 200 mg tablet   Yes Yes   Sig: Take 200 mg by mouth two (2) times a day.   omeprazole (PRILOSEC) 20 mg capsule 11/26/2014 at Unknown time  Yes Yes   Sig: Take 20 mg by mouth daily.   warfarin (COUMADIN) 5 mg tablet 11/25/2014 at Unknown time  Yes Yes Sig: Take 5 mg by mouth daily. Take 2 tablets (10mg )      Facility-Administered Medications: None     Current Facility-Administered Medications   Medication Dose Route Frequency Provider Last Rate Last Dose   ??? saline peripheral flush soln 5 mL  5 mL InterCATHeter PRN Meryle Ready, MD       ??? pantoprazole (PROTONIX) 80 mg in 0.9% sodium chloride 100 mL infusion  8 mg/hr IntraVENous CONTINUOUS Meryle Ready, MD 10 mL/hr at 11/27/14 0742 8 mg/hr at 11/27/14 0742   ??? ondansetron (ZOFRAN) injection 4 mg  4 mg IntraVENous Q6H PRN Meryle Ready, MD       ??? [START ON 11/30/2014] pantoprazole (PROTONIX) 40 mg in sodium chloride 0.9 % 10 mL injection  40 mg IntraVENous Q24H Meryle Ready, MD       ??? glucagon (GLUCAGEN) injection 1 mg  1 mg IntraMUSCular PRN Meryle Ready, MD       ??? dextrose (D50W) injection syrg 10-15 g  20-30 mL IntraVENous PRN Meryle Ready, MD       ??? dextrose 5% and 0.9% NaCl infusion  125 mL/hr IntraVENous CONTINUOUS Meryle Ready, MD 125 mL/hr at 11/27/14 0436 125 mL/hr at 11/27/14 0436   ??? oxyCODONE IR (ROXICODONE) tablet 5 mg  5 mg Oral Q4H PRN Meryle Ready, MD   5 mg at 11/27/14 1610   ??? oxyCODONE IR (OXY-IR) immediate release tablet 15 mg  15 mg Oral Q4H PRN Meryle Ready, MD       ??? albuterol CONCENTRATE 2.5mg /0.5 mL neb soln  2.5 mg Nebulization Q4H PRN Meryle Ready, MD        ??? amitriptyline (ELAVIL) tablet 50 mg  50 mg Oral  QHS Meryle Ready, MD   50 mg at 11/26/14 2133   ??? lamoTRIgine (LaMICtal) tablet 100 mg  100 mg Oral BID Meryle Ready, MD   100 mg at 11/27/14 1610   ??? 0.9% sodium chloride infusion 250 mL  250 mL IntraVENous PRN Meryle Ready, MD       ??? diphenhydrAMINE (BENADRYL) injection 12.5 mg  12.5 mg IntraVENous PRN Meryle Ready, MD         Past Surgical History   Procedure Laterality Date   ??? Hx hysterectomy     ??? Hx cholecystectomy     ??? Hx colectomy     ??? Pr cardiac surg procedure unlist       Family History   Problem Relation Age of Onset   ??? Diabetes Maternal Grandmother      History     Social History   ??? Marital Status: DIVORCED     Spouse Name: N/A   ??? Number of Children: N/A   ??? Years of Education: N/A     Occupational History   ??? Not on file.     Social History Main Topics   ??? Smoking status: Former Smoker   ??? Smokeless tobacco: Never Used   ??? Alcohol Use: No   ??? Drug Use: No   ??? Sexual Activity: Not on file     Other Topics Concern   ??? Not on file     Social History Narrative       Review of Systems:  Constitutional: negative  Eyes: negative  Ears, nose, mouth, throat, and face: negative  Respiratory: negative  Cardiovascular: negative  Gastrointestinal: positive for reflux symptoms and epigastric pain   Musculoskeletal:positive for arthralgias, stiff joints, back pain and muscle weakness      Objective:     BP 116/73 mmHg   Pulse 75   Temp(Src) 97.9 ??F (36.6 ??C)   Resp 18   Ht 5\' 7"  (1.702 m)   Wt 62.6 kg (138 lb 0.1 oz)   BMI 21.61 kg/m2   SpO2 100%   Breastfeeding? No  Temp (24hrs), Avg:98.1 ??F (36.7 ??C), Min:97.8 ??F (36.6 ??C), Max:98.5 ??F (36.9 ??C)    Filed Vitals:    11/27/14 0150 11/27/14 0221 11/27/14 0511 11/27/14 0748   BP: 112/65 108/69 112/67 116/73   Pulse: 78 73 71 75   Temp: 97.8 ??F (36.6 ??C) 97.9 ??F (36.6 ??C) 98.2 ??F (36.8 ??C) 97.9 ??F (36.6 ??C)   Resp: 18 18 18 18    Height:       Weight:       SpO2: 97% 95% 100% 100%        Intake/Output Summary (Last 24 hours) at 11/27/14 1026  Last data filed at 11/27/14 9604   Gross per 24 hour   Intake 481.25 ml   Output   1420 ml   Net -938.75 ml       Physical Exam:  GENERAL: alert, cooperative, no distress, appears stated age, EYE: negative, THROAT & NECK: normal and no erythema or exudates noted. , LUNG: clear to auscultation bilaterally, HEART: S1, S2 normal, ABDOMEN: soft, non-tender. Bowel sounds normal.EXTREMITIES:  extremities normal, atraumatic, no cyanosis or edema, SKIN: Normal., PSYCHIATRIC: non focal.       DATA    CBC w/Diff   Lab Results   Component Value Date/Time    WBC 5.7 11/26/2014 03:27 PM    WBC 11.5* 10/28/2014 03:47 PM  WBC 4.5 09/09/2014 01:24 PM    RBC 3.00* 11/26/2014 03:27 PM    RBC 3.75 10/28/2014 03:47 PM    RBC 3.96 09/09/2014 01:24 PM    HGB 9.0* 11/27/2014 07:06 AM    HGB 7.5* 11/26/2014 04:29 PM    HGB 6.3* 11/26/2014 03:27 PM    HCT 30.0* 11/27/2014 07:06 AM    HCT 22* 11/26/2014 04:29 PM    HCT 22.1* 11/26/2014 03:27 PM    MCV 73.7* 11/26/2014 03:27 PM    MCV 72.3* 10/28/2014 03:47 PM    MCV 78.5* 09/09/2014 01:24 PM  MCH 21.0* 11/26/2014 03:27 PM    MCH 21.9* 10/28/2014 03:47 PM    MCH 23.2* 09/09/2014 01:24 PM    MCHC 28.5* 11/26/2014 03:27 PM    MCHC 30.3 10/28/2014 03:47 PM    MCHC 29.6* 09/09/2014 01:24 PM    RDW 17.1* 04/28/2011 09:01 PM    PLT 281 11/26/2014 03:27 PM    PLT 439 10/28/2014 03:47 PM    PLT 286 09/09/2014 01:24 PM     Lab Results   Component Value Date/Time    GRANS 55.8 11/26/2014 03:27 PM    GRANS 79.1* 10/28/2014 03:47 PM    GRANS 49.3 09/09/2014 01:24 PM    MONOS 7.5 11/26/2014 03:27 PM    MONOS 5.1 10/28/2014 03:47 PM    MONOS 7.6 09/09/2014 01:24 PM    EOS 3.5 11/26/2014 03:27 PM    EOS 0.4 10/28/2014 03:47 PM    EOS 5.4* 09/09/2014 01:24 PM    BASOS 0.7 11/26/2014 03:27 PM    BASOS 0.5 10/28/2014 03:47 PM    BASOS 1.1 09/09/2014 01:24 PM        Hepatic Function    No results for input(s): ALT, SGOT, TBILI, AP, ALB, TP in the last 72 hours.    Invalid input(s): CBILI     Pancreatic Enzymes   No results for input(s): AML, LPSE in the last 72 hours.     Basic Metabolic Profile   Recent Labs      11/26/14   1629   NA  141   K  4.1   CL  105   CO2  22   BUN  8   GLU  81   CREA  0.7        POC Glucose  No components found for: [facility]    Coagulation   Recent Labs      11/27/14   0706  11/26/14   1539   PTP  21.9*  22.1*   INR  2.0*  2.0*          Stool FOB No results found for: OB1, OB2, OB3, OCBL, POCOBL, SOB, OCCBLDEXT    Microbiology Results      Radiology Results  Xr Chest Pa Lat    11/26/2014   Clinical history: Chest pain  EXAMINATION: PA and lateral views of the chest 11/26/2014  Correlation: CT scan 04/17/2014  FINDINGS: Trachea and heart size are within normal limits. Lungs are clear. There is hyperinflation.     11/26/2014   IMPRESSION: COPD.     Ct Head Wo Cont    11/26/2014   Clinical history: Head injury, fall  EXAMINATION: CT scan of the head. 4.5 mm axial scanning is performed from the skull base to the vertex. Coronal and sagittal reconstruction imaging has been obtained.  Correlation: 10/28/2014  FINDINGS: There is mild mucosal thickening of the ethmoid sinuses. Frontal, sphenoid and maxillary sinuses. Patchy  opacification of the left mastoid air cells. There are intracranial vascular calcifications.  Mild periventricular microvascular ischemia. No evidence of acute intracranial hemorrhage, mass or infarction. No extra-axial fluid collections.     11/26/2014   IMPRESSION: Mild periventricular microvascular ischemia.     Ct Spine Cerv Wo Cont    11/26/2014   Clinical history: Fall, pain  EXAMINATION:  CT scan of the cervical spine. 3 mm axial scanning is performed from the skull base to the C7-T1. Coronal and sagittal reconstruction imaging has been obtained.  FINDINGS: No prevertebral soft tissue swelling. Mild grade 1 anterior  spondylolisthesis of C4 on C5. Vertebral bodies maintain normal height. Atlantoaxial joint is within normal limits. Multilevel osteophyte disc complex and facet hypertrophic changes. No fracture cervical spine.     11/26/2014   IMPRESSION: 1. No fracture cervical spine. 2. Degenerative changes cervical spine.         Nelta Numbers, NP, FNP-BC  [facility]  [phone] (c)  [facility] [phone]  [place] Office [phone]

## 2014-11-27 NOTE — Progress Notes (Signed)
Medicine Progress Note    Patient: [name] [name]   Age:  64 y.o.  DOA: 11/26/2014   Admit Dx / CC: GI bleed  GI BLEED  LOS:  LOS: 1 day     Assessment/Plan   Acute on chronic symptomatic anemia  - secondary to gastro intestinal??bleed  -transfused 2 ??units packed RBC 3/27  -serial hemoglobin and hematocrit transfuse PRn Hg<7  -GI consultation  -Protonix drip  -Coumadin held  -EGD in AM    UTI  -levaquin  -obtain Ucx    History of deep venous thrombosis in 2011, pulmonary embolus in 2009.?? ??  -Coumadin placed on hold.    Seizure disorder  -Continue home medications    Deep venous thrombosis prophylaxis   -sequential compression devices.            Subjective:   Patient seen and examined. No complaints, No N/V, F/C, CP or SOB      Objective:     Visit Vitals   Item Reading   ??? BP 113/63 mmHg   ??? Pulse 76   ??? Temp 98.8 ??F (37.1 ??C)   ??? Resp 18   ??? Ht  (1.702 m)   ??? Wt 62.6 kg (138 lb 0.1 oz)   ??? BMI 21.61 kg/m2   ??? SpO2 99%   ??? Breastfeeding No       Physical Exam:  General appearance: alert, cooperative, no distress, appears stated age  Head: Normocephalic, without obvious abnormality, atraumatic  Neck: supple, trachea midline  Lungs: clear to auscultation bilaterally  Heart: regular rate and rhythm, S1, S2 normal, no murmur, click, rub or gallop  Abdomen: soft, non-tender. Bowel sounds normal. No masses,  no organomegaly  Extremities: extremities normal, atraumatic, no cyanosis or edema  Skin: Skin color, texture, turgor normal. No rashes or lesions  Neurologic: Grossly normal        Medications Reviewed:  Current Facility-Administered Medications   Medication Dose Route Frequency   ??? saline peripheral flush soln 5 mL  5 mL InterCATHeter PRN   ??? [START ON 11/30/2014] pantoprazole (PROTONIX) 40 mg in sodium chloride 0.9 % 10 mL injection  40 mg IntraVENous Q24H   ??? pantoprazole (PROTONIX) 80 mg in 0.9% sodium chloride 100 mL infusion  8 mg/hr IntraVENous CONTINUOUS    ??? ondansetron (ZOFRAN) injection 4 mg  4 mg IntraVENous Q6H PRN   ??? glucagon (GLUCAGEN) injection 1 mg  1 mg IntraMUSCular PRN   ??? dextrose (D50W) injection syrg 10-15 g  20-30 mL IntraVENous PRN   ??? dextrose 5% and 0.9% NaCl infusion  125 mL/hr IntraVENous CONTINUOUS   ??? oxyCODONE IR (ROXICODONE) tablet 5 mg  5 mg Oral Q4H PRN   ??? oxyCODONE IR (OXY-IR) immediate release tablet 15 mg  15 mg Oral Q4H PRN   ??? albuterol CONCENTRATE 2.5mg /0.5 mL neb soln  2.5 mg Nebulization Q4H PRN   ??? amitriptyline (ELAVIL) tablet 50 mg  50 mg Oral QHS   ??? lamoTRIgine (LaMICtal) tablet 100 mg  100 mg Oral BID   ??? 0.9% sodium chloride infusion 250 mL  250 mL IntraVENous PRN   ??? diphenhydrAMINE (BENADRYL) injection 12.5 mg  12.5 mg IntraVENous PRN         Intake and Output:  Current Shift:  03/28 0701 - 03/28 1900  In: 959.6 [P.O.:120; I.V.:839.6]  Out: 1000 [Urine:1000]  Last three shifts:  03/26 1901 - 03/28 0700  In: 481.3 [I.V.:181.3]  Out: 1420 [Urine:1420]  Lab/Data Reviewed:  All lab results for the last 24 hours reviewed.    Kristie CowmanOY P Myrka Sylva, MD  P# [phone]  November 27, 2014

## 2014-11-27 NOTE — Other (Deleted)
Dear Dr. Robin SearingMacgregor,     Patient is admitted ofr GI Bleed and Acute on Chronic Symptomatic Anemia. PER H&P   When she arrived in the ER, she was noted to have a hemoglobin of 6.3 and per ER report, they checked her stool and it was positive for hemoccult blood.  3/27-Hb/Hct 6.3/22.1  3/27 Hb/Hct 7.5/22  3/28 Hb/Hct 9.0/30.0  Pt received 2 units of PRBC  Pt will undergo EGD.    Please document if pt has  Acute blood Loss Anemia  Chronic Ironb Deficiency Anemia  Anemia  Unable to determine    Thanks!    Teresa CoombsGul-Shabana Debara Kamphuis MD, CCS, CCDS, CDIP  Clinical Documentation Specialist  [phone]

## 2014-11-27 NOTE — Other (Deleted)
Dear Dr. Robin SearingMacgregor,     Patient is admitted for Gi Bleed and Acute on Chronic Anemia. PER H&P pt has dropped 4 dress sizes since Janurary.   PER DIETICIAN NOTE  Adult Malnutrition Guidelines:  SEVERE PROTEIN CALORIE MALNUTRITION IN THE CONTEXT OF CHRONIC ILLNESS  Weight Loss:  >5% x 1 month or >7.5% x 3 months or >10% x 6 months or >20% x 12 months  Energy Intake: <75% energy intake compared to estimated energy needs >1 month    Muscle Mass: moderate depletion at the clavicle  Muscle wasting: mild at the temples, moderate at the clavicle  Interventions:  Rec Glucerna TID  BMI 21.61  Wt change: -10 kg (14% BW) within 3 months per pt report, severe    Please document if pt has  Mild Protein Calorie Malnutrition  Moderate Protein Calorie Malnutrition  Severe Protein Calorie malnutrition  Unable to determine    Thanks!    Teresa CoombsGul-Shabana Seraiah Nowack MD, CCS, CCDS, CDIP  Clinical Documentation Specialist  [phone]

## 2014-11-27 NOTE — Other (Signed)
Bedside and Verbal shift change report given to CHASSIDY Sharyn LullA KEAR, RN (oncoming nurse) by Jule EconomyJoy, Rn (offgoing nurse). Report included the following information SBAR, Kardex and Intake/Output.

## 2014-11-27 NOTE — Progress Notes (Signed)
Adult Malnutrition Criteria:     Patient is a 64 y.o. female, admitted on [id number] with a diagnosis of GI bleed.  Nutrition assessment was completed by RDN and the patient was found to meet the following malnutrition criteria established by ASPEN/AND:    Adult Malnutrition Guidelines:  SEVERE PROTEIN CALORIE MALNUTRITION IN THE CONTEXT OF CHRONIC ILLNESS  Weight Loss:  >5% x 1 month or >7.5% x 3 months or >10% x 6 months or >20% x 12 months  Energy Intake: <75% energy intake compared to estimated energy needs >1 month   Muscle Mass: moderate depletion at the clavicle    Interventions:  Rec Glucerna TID    Please document MALNUTRITION in Problem List if in agreement.      NUTRITION INITIAL EVALUATION    NUTRITION ASSESSMENT:       Reason for assessment: nurse screen (wt loss)    Admitting diagnosis: GI bleed  GI BLEED      PMH:   Past Medical History   Diagnosis Date   ??? Seizure (HCC)    ??? Diabetes mellitus    ??? Stroke [facility])    ??? DVT (deep venous thrombosis) (HCC)    ??? Adjustment disorder with mixed anxiety and depressed mood    ??? Aneurysm of internal carotid artery    ??? Backache    ??? Cervicalgia    ??? Chronic pain syndrome    ??? Coccydynia    ??? Fibromyalgia    ??? Generalized osteoarthritis of multiple sites    ??? Hemiparesis, right (HCC)    ??? Hyperlipidemia    ??? Irregular sleep-wake rhythm    ??? Knee joint pain    ??? Lumbar spondylosis    ??? Muscle spasm    ??? Osteoporosis    ??? Pulmonary embolism (HCC)    ??? Subclinical hypothyroidism    ??? Seizures (HCC)    ??? Autoimmune disease (HCC)    ??? Pancreatitis         Anthropometrics:  Height: Ht Readings from Last 3 Encounters:   11/26/14  (1.702 m)   06/15/13  (1.702 m)   04/28/13  (1.702 m) ??       Weight: Wt Readings from Last 3 Encounters:   11/26/14 62.6 kg (138 lb 0.1 oz)   06/15/13 65.772 kg (145 lb)   04/28/13 64.864 kg (143 lb) ??       ?? IBW: 61.4 kg     ?? % IBW: 103%    ?? BMI: Body mass index is 21.61 kg/(m^2).    ?? UBW: 73 kg     ?? Wt change: -10 kg (14% BW) within 3 months per pt report, severe   Diet and intake history:  ?? Current diet order: DIET DIABETIC FULL LIQUID  ?? DIET NPO Except Meds    ?? Food allergies: NKFA    ?? Diet/intake history:           <75% intake x 3 months    ?? Current appetite/PO intake: fair, pt eating grapes upon visit     ?? Assessment of current MNT: FL DB is not adequate to meet nutritional needs, advance to DB1800 diet when medically able    ?? Cultural, religious, and ethnic food preferences identified: none    Physical Assessment:  ?? GI symptoms: abdomen discomfort    ?? Chewing/swallowing issues: none    ?? Skin integrity: intact    ?? Muscle wasting: mild at the temples, moderate at the  clavicle    ?? Fluid accumulation: none    ?? Mental status: WNL    ?? Other: pt pale in appearance Intake and output:    Intake/Output Summary (Last 24 hours) at 11/27/14 1126  Last data filed at 11/27/14 0925   Gross per 24 hour   Intake 481.25 ml   Output   1420 ml   Net -938.75 ml       Estimated daily nutrition intake needs:  ?? 1589 - 1833 kcals (1.3-1.5 x mifflin)    ?? 76 - 88 g protein (1.2-1.4 g/kg BW)    ?? 1890 ml fluid (30 mL/kg BW)     Living situation: with daughter    Current pertinent medications: Reviewed    Pertinent labs: Reviewed    Does patient meet malnutrition criteria: Chronic, severe PCM    NUTRITION DIAGNOSIS:     1. Malnutirion related to PO intake <75% needs x 3 months as evidenced by physical assessment, 10 kg wt loss within 3 months per pt report.    NUTRITION INTERVENTION:     Recommended diet: DB1800 when medically able    Recommended nutrition supplement: Glucerna TID (provides 220 kcals, 10 g protein each), which is carb-controlled compared to magic cup    NUTRITION MONITORING AND EVALUATION:     Nutrition level of care: moderate    Nutrition monitoring: PO intake, nutrition-related labs, medical change of condition    Nutrition goals: PO intake >75% meals and supplements, nutrition-related labs WNL     NUTRITION EDUCATION:     Diet instructed on: high kcal/pro    Education modifications: none    Barriers to learning/intervention: none    Method of Instruction: verbal, written    Patient comprehension: good    Expected compliance: good    Rayfield CitizenLena Marie Husnay, RD  11/27/2014  Pager:  [phone]

## 2014-11-28 LAB — GLUCOSE, POC
Glucose (POC): 219 mg/dL — ABNORMAL HIGH (ref 65–105)
Glucose (POC): 122 mg/dL — ABNORMAL HIGH (ref 65–105)
Glucose (POC): 102 mg/dL (ref 65–105)

## 2014-11-28 LAB — IRON PROFILE
Iron % saturation: 11 % — ABNORMAL LOW (ref 20–45)
Iron: 48 ug/dL — ABNORMAL LOW (ref 50–170)
TIBC: 432 ug/dL (ref 250–450)

## 2014-11-28 LAB — PROTHROMBIN TIME + INR
INR: 1.8 — ABNORMAL HIGH (ref 0.0–1.1)
Prothrombin time: 19.9 seconds — ABNORMAL HIGH (ref 11.5–14.0)

## 2014-11-28 LAB — TIBC: TIBC: 432 ug/dL (ref 250–450)

## 2014-11-28 LAB — T4, FREE: Free T4: 0.85 ng/dl (ref 0.76–1.46)

## 2014-11-28 LAB — TSH 3RD GENERATION: TSH: 1.36 u[IU]/mL (ref 0.358–3.740)

## 2014-11-28 MED ORDER — PROPOFOL 10 MG/ML IV EMUL
10 mg/mL | INTRAVENOUS | Status: AC
Start: 2014-11-28 — End: ?

## 2014-11-28 MED ORDER — SODIUM CHLORIDE 0.9 % IJ SYRG
Freq: Three times a day (TID) | INTRAMUSCULAR | Status: DC
Start: 2014-11-28 — End: 2014-11-28

## 2014-11-28 MED ORDER — SODIUM CHLORIDE 0.9 % IV
INTRAVENOUS | Status: DC | PRN
Start: 2014-11-28 — End: 2014-11-28
  Administered 2014-11-28: 17:00:00 via INTRAVENOUS

## 2014-11-28 MED ORDER — LIDOCAINE (PF) 20 MG/ML (2 %) IV SYRINGE
100 mg/5 mL (2 %) | INTRAVENOUS | Status: AC
Start: 2014-11-28 — End: ?

## 2014-11-28 MED ORDER — PROPOFOL 10 MG/ML IV EMUL
10 mg/mL | INTRAVENOUS | Status: DC | PRN
Start: 2014-11-28 — End: 2014-11-28
  Administered 2014-11-28 (×7): via INTRAVENOUS

## 2014-11-28 MED ORDER — SODIUM CHLORIDE 0.9 % IV
INTRAVENOUS | Status: DC
Start: 2014-11-28 — End: 2014-11-28

## 2014-11-28 MED ORDER — SODIUM CHLORIDE 0.9 % IJ SYRG
INTRAMUSCULAR | Status: DC | PRN
Start: 2014-11-28 — End: 2014-11-29

## 2014-11-28 MED ORDER — SODIUM CHLORIDE 0.9 % IJ SYRG
INTRAMUSCULAR | Status: DC | PRN
Start: 2014-11-28 — End: 2014-11-28

## 2014-11-28 MED FILL — LAMOTRIGINE 100 MG TAB: 100 mg | ORAL | Qty: 1

## 2014-11-28 MED FILL — LIDOCAINE (PF) 20 MG/ML (2 %) IV SYRINGE: 100 mg/5 mL (2 %) | INTRAVENOUS | Qty: 5

## 2014-11-28 MED FILL — PROTONIX 40 MG INTRAVENOUS SOLUTION: 40 mg | INTRAVENOUS | Qty: 80

## 2014-11-28 MED FILL — OXYCODONE 5 MG TAB: 5 mg | ORAL | Qty: 1

## 2014-11-28 MED FILL — DIPRIVAN 10 MG/ML INTRAVENOUS EMULSION: 10 mg/mL | INTRAVENOUS | Qty: 50

## 2014-11-28 MED FILL — SODIUM CHLORIDE 0.9 % IV: INTRAVENOUS | Qty: 1000

## 2014-11-28 MED FILL — LEVOFLOXACIN IN D5W 750 MG/150 ML IV PIGGY BACK: 750 mg/150 mL | INTRAVENOUS | Qty: 150

## 2014-11-28 MED FILL — AMITRIPTYLINE 50 MG TAB: 50 mg | ORAL | Qty: 1

## 2014-11-28 MED FILL — BD POSIFLUSH NORMAL SALINE 0.9 % INJECTION SYRINGE: INTRAMUSCULAR | Qty: 10

## 2014-11-28 NOTE — Progress Notes (Signed)
Medicine Progress Note    Patient: [name] [name]   Age:  64 y.o.  DOA: 11/26/2014   Admit Dx / CC: GI bleed  GI BLEED  LOS:  LOS: 2 days     Assessment/Plan   Acute on chronic symptomatic anemia  -secondary to gastro intestinal?? bleed with  acute blood loss anemia   -transfused 2 ??units packed RBC 3/27  -serial hemoglobin and hematocrit transfuse PRn Hg<7  -GI consultation appreciated   -Protonix PO  -Coumadin held  -EGD with retained food, no bleed identified     UTI  -levaquin  -await Ucx    History of deep venous thrombosis in 2011, pulmonary embolus in 2009.?? ??  -Coumadin placed on hold.    Seizure disorder  -Continue home medications    Deep venous thrombosis prophylaxis   -sequential compression devices.            Subjective:   Patient seen and examined. No complaints, No N/V, F/C, CP or SOB      Objective:     Visit Vitals   Item Reading   ??? BP 120/78 mmHg   ??? Pulse 74   ??? Temp 98 ??F (36.7 ??C)   ??? Resp 16   ??? Ht  (1.702 m)   ??? Wt 62.6 kg (138 lb 0.1 oz)   ??? BMI 21.61 kg/m2   ??? SpO2 99%   ??? Breastfeeding No       Physical Exam:  General appearance: alert, cooperative, no distress, appears stated age  Head: Normocephalic, without obvious abnormality, atraumatic  Neck: supple, trachea midline  Lungs: clear to auscultation bilaterally  Heart: regular rate and rhythm, S1, S2 normal, no murmur, click, rub or gallop  Abdomen: soft, non-tender. Bowel sounds normal. No masses,  no organomegaly  Extremities: extremities normal, atraumatic, no cyanosis or edema  Skin: Skin color, texture, turgor normal. No rashes or lesions  Neurologic: Grossly normal        Medications Reviewed:  Current Facility-Administered Medications   Medication Dose Route Frequency   ??? sodium chloride (NS) flush 5-10 mL  5-10 mL IntraVENous PRN   ??? [START ON 11/30/2014] pantoprazole (PROTONIX) 40 mg in sodium chloride 0.9 % 10 mL injection  40 mg IntraVENous Q24H    ??? pantoprazole (PROTONIX) 80 mg in 0.9% sodium chloride 100 mL infusion  8 mg/hr IntraVENous CONTINUOUS   ??? levofloxacin (LEVAQUIN) 750 mg in D5W IVPB  750 mg IntraVENous Q24H   ??? ondansetron (ZOFRAN) injection 4 mg  4 mg IntraVENous Q6H PRN   ??? glucagon (GLUCAGEN) injection 1 mg  1 mg IntraMUSCular PRN   ??? dextrose (D50W) injection syrg 10-15 g  20-30 mL IntraVENous PRN   ??? oxyCODONE IR (ROXICODONE) tablet 5 mg  5 mg Oral Q4H PRN   ??? oxyCODONE IR (OXY-IR) immediate release tablet 15 mg  15 mg Oral Q4H PRN   ??? albuterol CONCENTRATE 2.5mg /0.5 mL neb soln  2.5 mg Nebulization Q4H PRN   ??? amitriptyline (ELAVIL) tablet 50 mg  50 mg Oral QHS   ??? lamoTRIgine (LaMICtal) tablet 100 mg  100 mg Oral BID   ??? diphenhydrAMINE (BENADRYL) injection 12.5 mg  12.5 mg IntraVENous PRN         Intake and Output:  Current Shift:  03/29 0701 - 03/29 1900  In: 380 [I.V.:380]  Out: 600 [Urine:600]  Last three shifts:  03/27 1901 - 03/29 0700  In: 2591.4 [P.O.:420; I.V.:1871.4]  Out: 6720 [Urine:6720]  Lab/Data Reviewed:  All lab results for the last 24 hours reviewed.    Kristie CowmanOY P Kori Goins, MD  P# [phone]  November 28, 2014

## 2014-11-28 NOTE — Anesthesia Pre-Procedure Evaluation (Addendum)
Anesthetic History   No history of anesthetic complications            Review of Systems / Medical History  Patient summary reviewed, nursing notes reviewed and pertinent labs reviewed    Pulmonary                   Neuro/Psych     seizures  CVA  TIA and psychiatric history    Comments: Chronic pain cervicalgia  Fibromyalgia chronic pain syndrome Cardiovascular              Hyperlipidemia      Comments: DVT   GI/Hepatic/Renal               Comments: H/o pancreatitis Endo/Other    Diabetes  Hypothyroidism  Arthritis and anemia     Other Findings            Physical Exam    Airway  Mallampati: I  TM Distance: 4 - 6 cm  Neck ROM: normal range of motion   Mouth opening: Normal     Cardiovascular    Rhythm: regular           Dental  No notable dental hx       Pulmonary  Breath sounds clear to auscultation               Abdominal  GI exam deferred       Other Findings            Anesthetic Plan    ASA: 3  Anesthesia type: general and total IV anesthesia          Induction: Intravenous  Anesthetic plan and risks discussed with: Patient

## 2014-11-28 NOTE — Progress Notes (Addendum)
2200- pt c/o iv hurting. Requesting protonix drip to be stopped until new iv inserted. Pt refusing to allow anyone other than picc team to place iv. picc team paged 4 times and sent text page. Waiting for callback.     0020 repaged. Charge nurse notified. MD notified.     0100- MD ordered PO Protonix 40mg  BID and d/c protonix drip and ok for pt to have no iv access

## 2014-11-28 NOTE — Other (Signed)
TRANSFER - OUT REPORT:    Verbal report given to CHASITY KEAR RN(name) on Clemencia CourseVeronica E Eddington  being transferred to 5W(unit) for routine post - op       Report consisted of patient???s Situation, Background, Assessment and   Recommendations(SBAR).     Information from the following report(s) Procedure Summary, Intake/Output and Med Rec Status was reviewed with the receiving nurse.    Lines:   Peripheral IV 11/26/14 Right Basilic (Active)   Site Assessment Clean, dry, & intact 11/28/2014  1:25 PM   Phlebitis Assessment 0 11/28/2014  1:25 PM   Infiltration Assessment 0 11/28/2014  1:25 PM   Dressing Status Clean, dry, & intact 11/28/2014  1:25 PM   Dressing Type Transparent 11/28/2014  1:25 PM   Hub Color/Line Status Pink 11/28/2014  1:25 PM   Alcohol Cap Used Yes 11/28/2014  8:00 AM       Peripheral IV 11/26/14 Right;Distal Cephalic (Active)   Site Assessment Clean, dry, & intact 11/28/2014  1:25 PM   Phlebitis Assessment 0 11/28/2014  1:25 PM   Infiltration Assessment 0 11/28/2014  1:25 PM   Dressing Status Clean, dry, & intact 11/28/2014  1:25 PM   Dressing Type Transparent 11/28/2014  1:25 PM   Hub Color/Line Status Blue 11/28/2014  1:25 PM   Alcohol Cap Used Yes 11/28/2014  8:00 AM        Opportunity for questions and clarification was provided.      Patient transported with:   O2 @ 0 liters  Tech

## 2014-11-28 NOTE — Anesthesia Post-Procedure Evaluation (Signed)
Post-Anesthesia Evaluation and Assessment    Patient: [name] [name] MRN: [id number]  SSN: [id number]    Date of Birth: 04/14/1951  Age: 64 y.o.  Sex: female       Cardiovascular Function/Vital Signs  Visit Vitals   Item Reading   ??? BP 121/82 mmHg   ??? Pulse 74   ??? Temp 36.7 ??C (98 ??F)   ??? Resp 16   ??? Ht 5\' 7"  (1.702 m)   ??? Wt 62.6 kg (138 lb 0.1 oz)   ??? BMI 21.61 kg/m2   ??? SpO2 98%   ??? Breastfeeding No       Patient is status post general, total IV anesthesia anesthesia for Procedure(s):  ESOPHAGOGASTRODUODENOSCOPY (EGD).    Nausea/Vomiting: None    Postoperative hydration reviewed and adequate.    Pain:  Pain Scale 1: Numeric (0 - 10) (11/28/14 1325)  Pain Intensity 1: 0 (11/28/14 1325)   Managed    Neurological Status:   Neuro  Neurologic State: Alert (11/28/14 [id number])  Orientation Level: Oriented X4 (11/28/14 0815)  Cognition: Appropriate decision making (11/27/14 0800)  Speech: Clear (11/27/14 0800)   At baseline    Mental Status and Level of Consciousness: Alert and oriented     Pulmonary Status:   O2 Device: Room air (11/28/14 1325)   Adequate oxygenation and airway patent    Complications related to anesthesia: None    Post-anesthesia assessment completed. No concerns    Signed By: Vickie EpleyIMOTHY Chanice Brenton, MD     November 28, 2014

## 2014-11-28 NOTE — Other (Signed)
Bedside and Verbal shift change report given to CHASSIDY A KEAR, RN (oncoming nurse) by April, RN (offgoing nurse). Report included the following information SBAR, Kardex and MAR.

## 2014-11-28 NOTE — Progress Notes (Signed)
Pt arrived in room from Endo, immediately began eating grapes and other food that she has at bedside. This writer was going to start pt on Full Liquid DM diet, however, since pt is eating and demanding crackers, juice, soda...etc, this writer will change diet order to 1800 Carb Controlled based on pt hx of DM. Will cont to monitor.

## 2014-11-28 NOTE — Other (Signed)
TRANSFER - IN REPORT:     [name] CourseVeronica E [name]  being received from 5210 for ordered procedure      Report consisted of patient???s Situation, Background, Assessment and   Recommendations(SBAR).     Information from the following report(s) Kardex, OR Summary, MAR and Recent Results was reviewed with the receiving nurse.    Opportunity for questions and clarification was provided.      Assessment completed upon patient???s arrival to unit and care assumed.

## 2014-11-28 NOTE — Other (Signed)
TRANSFER - IN REPORT:    Verbal report received from Brandi, RN(name) on Clemencia CourseVeronica E Grams  being received from PACU (unit) for routine post - op      Report consisted of patient???s Situation, Background, Assessment and   Recommendations(SBAR).     Information from the following report(s) SBAR, Kardex and MAR was reviewed with the receiving nurse.    Opportunity for questions and clarification was provided.      Assessment completed upon patient???s arrival to unit and care assumed.

## 2014-11-29 LAB — RENAL FUNCTION PANEL
Albumin: 2.8 gm/dl — ABNORMAL LOW (ref 3.4–5.0)
BUN: 5 mg/dl — ABNORMAL LOW (ref 7–25)
CO2: 27 mEq/L (ref 21–32)
Calcium: 8.5 mg/dl (ref 8.5–10.1)
Chloride: 109 mEq/L — ABNORMAL HIGH (ref 98–107)
Creatinine: 0.7 mg/dl (ref 0.6–1.3)
GFR est AA: >60
GFR est non-AA: >60
Glucose: 103 mg/dl (ref 74–106)
Phosphorus: 3.6 mg/dl (ref 2.5–4.9)
Potassium: 4 mEq/L (ref 3.5–5.1)
Sodium: 142 mEq/L (ref 136–145)

## 2014-11-29 LAB — CBC WITH AUTOMATED DIFF
BASOPHILS: 0.9 % (ref 0–3)
EOSINOPHILS: 5.9 % — ABNORMAL HIGH (ref 0–5)
HCT: 35.1 % — ABNORMAL LOW (ref 37.0–50.0)
HGB: 10 gm/dl — ABNORMAL LOW (ref 13.0–17.2)
IMMATURE GRANULOCYTES: 0.1 % (ref 0.0–3.0)
LYMPHOCYTES: 25 % — ABNORMAL LOW (ref 28–48)
MCH: 22.5 pg — ABNORMAL LOW (ref 25.4–34.6)
MCHC: 28.5 gm/dl — ABNORMAL LOW (ref 30.0–36.0)
MCV: 78.9 fL — ABNORMAL LOW (ref 80.0–98.0)
MONOCYTES: 11.7 % (ref 1–13)
MPV: 9.9 fL (ref 6.0–10.0)
NEUTROPHILS: 56.4 % (ref 34–64)
NRBC: 0 (ref 0–0)
PLATELET: 279 10*3/uL (ref 140–450)
RBC: 4.45 M/uL (ref 3.60–5.20)
RDW-SD: 59.7 — ABNORMAL HIGH (ref 36.4–46.3)
WBC: 7.4 10*3/uL (ref 4.0–11.0)

## 2014-11-29 LAB — CULTURE, URINE
CULTURE RESULT: NO GROWTH
Culture result: NO GROWTH

## 2014-11-29 LAB — MAGNESIUM: Magnesium: 1.8 mg/dl (ref 1.8–2.4)

## 2014-11-29 MED ORDER — PANTOPRAZOLE 40 MG TAB, DELAYED RELEASE
40 mg | Freq: Two times a day (BID) | ORAL | Status: DC
Start: 2014-11-29 — End: 2014-11-29
  Administered 2014-11-29 (×2): via ORAL

## 2014-11-29 MED ORDER — FERROUS GLUCONATE 324 MG (36 MG ELEMENTAL IRON) TAB
324 mg (36 mg iron) | ORAL_TABLET | Freq: Two times a day (BID) | ORAL | Status: DC
Start: 2014-11-29 — End: 2016-07-14

## 2014-11-29 MED FILL — OXYCODONE 5 MG TAB: 5 mg | ORAL | Qty: 1

## 2014-11-29 MED FILL — AMITRIPTYLINE 50 MG TAB: 50 mg | ORAL | Qty: 1

## 2014-11-29 MED FILL — PANTOPRAZOLE 40 MG TAB, DELAYED RELEASE: 40 mg | ORAL | Qty: 1

## 2014-11-29 MED FILL — LAMOTRIGINE 100 MG TAB: 100 mg | ORAL | Qty: 1

## 2014-11-29 MED FILL — PROTONIX 40 MG INTRAVENOUS SOLUTION: 40 mg | INTRAVENOUS | Qty: 80

## 2014-11-29 NOTE — Progress Notes (Signed)
DISCHARGE SUMMARY from Nurse    The following personal items are in your possession at time of discharge:    Dental Appliances: None  Visual Aid: Glasses     Home Medications: None  Jewelry: Bracelet, Necklace, Sent home  Clothing: Robe (1 robe, 1gown)  Other Valuables: Cell Phone         PATIENT INSTRUCTIONS:    Notify you Physician if you experience:  ?? Increased Shortness of Breath  ?? Dizziness  ?? Fainting  ?? Black Stools  ?? Vomiting  ?? Coughing White, Frothy or Bloody Sputum  ?? Dry cough that will not go away  ?? Increased swelling of arms, legs, ankles or abdomen  ?? Develop a rash  ?? Difficulty breathing while laying down  ?? Urine problems: pain, burning, urgency or difficulty  ?? Temperature greater than 100 degrees F, shaking, chills for more than 24 hours  ?? Increased skin bruising  ?? Sore Mouth, throat or gums  ?? Increased cough, fatigue  ?? Clicking/popping sound in a joint  ?? Increased limb shortening or turning outward        Call you Doctor right away if you have new symptoms such as:  ?? Cough that is worse at night and when you are lying down  ?? Swelling in your legs, ankles, feet, abdomen and/or veins in the neck      Lifestyle Tips:  Appointment after discharge and follow up phone call:   After being discharged, if your appointment does not work for you, please feel free to contact the physician's office to change the appointment.   We want to make sure you are doing well after you have left the hospital.  You will receive a phone call on behalf of [facility] to follow up with you within 24-72 hours of your discharge from the hospital.  This phone call will help [place] provide you with the best possible care.  Please take the time to answer all questions you are asked so we can help you stay safe in your home before your next doctor's appointment.    Medications:   Take your medicines exactly as instructed.   Ask your doctor or nurse if you have questions about your medicines.    Tell your doctor right away if you have problems with your medicines.  Activity:   Ask your doctor how active you should be.  Remember to take rest breaks.   Do not cross your legs when sitting.   Elevate your legs when you can.  Diet:   Follow any special diet orders from your doctor.   Ask your doctor how much salt (sodium) you should have each day.   Ask if you need to limit the amount or types of fluids you drink each day.  Weight Monitoring:   If you are overweight, ask about a weight loss plan that is right for you.   Weight gain may mean that fluids are building up in your body.   Weigh yourself every day at about the same time and write it down.  Do Not Smoke:   If you smoke, quit.  Smoking is bad for your health.   Avoid second hand smoke.   Call the [place] QUITLINE for help at 727 698 2729 (1-800-Quit - Now)  Other Tips:   Avoid people with the flu or pneumonia   Keep all of your doctor appointments   Carry a list of your medications, allergies and the shots you have had  These are general instructions for a healthy lifestyle:    No smoking/ No tobacco products/ Avoid exposure to second hand smoke    Surgeon General's Warning:  Quitting smoking now greatly reduces serious risk to your health.    Obesity, smoking, and sedentary lifestyle greatly increases your risk for illness    A healthy diet, regular physical exercise & weight monitoring are important for maintaining a healthy lifestyle    You may be retaining fluid if you have a history of heart failure or if you experience any of the following symptoms:  Weight gain of 3 pounds or more overnight or 5 pounds in a week, increased swelling in our hands or feet or shortness of breath while lying flat in bed.  Please call your doctor as soon as you notice any of these symptoms; do not wait until your next office visit.    Recognize signs and symptoms of STROKE:    F-face looks uneven    A-arms unable to move or move unevenly     S-speech slurred or non-existent    T-time-call 911 as soon as signs and symptoms begin-DO NOT go       Back to bed or wait to see if you get better-TIME IS BRAIN.    Warning Signs of HEART ATTACK     Call 911 if you have these symptoms:  ? Chest discomfort. Most heart attacks involve discomfort in the center of the chest that lasts more than a few minutes, or that goes away and comes back. It can feel like uncomfortable pressure, squeezing, fullness, or pain.  ? Discomfort in other areas of the upper body. Symptoms can include pain or discomfort in one or both arms, the back, neck, jaw, or stomach.  ? Shortness of breath with or without chest discomfort.  ? Other signs may include breaking out in a cold sweat, nausea, or lightheadedness.  Don't wait more than five minutes to call 911 ??? MINUTES MATTER! Fast action can save your life. Calling 911 is almost always the fastest way to get lifesaving treatment. Emergency Medical Services staff can begin treatment when they arrive ??? up to an hour sooner than if someone gets to the hospital by car.     Myself and/or my family have received education about my diagnosis throughout my hospital stay.  During my hospital stay, I and/or my family/caregiver were included in planning my care upon discharge.  My needs were taken into consideration and I was included in my discharge planning.  I had an opportunity to ask questions.      The discharge information has been reviewed with the patient.  The patient verbalized understanding.    Discharge Caregiver Notified of Impending Discharge: Yes  Name of Designated Discharge Caregiver: Kerby LessGariel Lettrel Delay  Discharge Plan/Follow Up Care Reviewed: Glendon AxeYes-via phone (caregiver copy sent home with patient)       Discharge medications reviewed with the patient and appropriate educational materials and side effects teaching were provided.

## 2014-11-29 NOTE — Progress Notes (Signed)
GI Progress Note    Admit Date:  11/26/2014     Assessment:     1. IDA - [place] [facility] [name] [place] [facility]; polyp removed, internal hemorrhoids. With EGD findings that do not explain blood loss, will need M2A.  2. Epigastric pain - s/p EGD 11/28/14 w findings of retained food, friable anastomotic margin. Likely gastroparesis related.  3. Weight loss - 4 dress sizes since January. ?etiology  4. Coumadin therapy for Hx of PE/DVT/cardiac stents, currently on hold -?? INR 2.0. ??  5. S/[place] gast[place]15 [place]. ??  6. Co morbidities: Lupus, DM II, seizure disorder, fibromyalgia, chronic pain, HTN, CABG w/ stents.    Plan:     ?? Low residue diet  ?? Will need M2A as outpatient  ?? F/u appt made for Tues, 12/19/14 at 10:30am w Dr. [name]    Subjective:     Reports one brown stool overnight. No abdominal pain. No N/V. Hgb steady at 10. F/u appt made for 12/19/14 at 1030 w Dr. [name].    Patient Active Problem List   Diagnosis Code   ??? Rib pain R07.81   ??? Lupus (HCC) M32.9   ??? Osteoarthrosis, unspecified whether generalized or localized, unspecified site M19.90   ??? Neuralgia, neuritis, and radiculitis, unspecified [id number]   ??? Myalgia and myositis, unspecified M79.1, M60.9   ??? Pain in limb M79.609   ??? Pain in joint, multiple sites M25.50   ??? Type II or unspecified type diabetes mellitus without mention of complication, not stated as uncontrolled (HCC) E11.9   ??? 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 Encounter for long-term (current) use of other medications Z79.899   ??? Abdominal pain, generalized R10.84   ??? 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 Back pain, lumbosacral M54.5, M54.89   ??? GI bleed K92.2        Objective:     BP 107/67 mmHg   Pulse 78   Temp(Src) 97.9 ??F (36.6 ??C)   Resp 18   Ht 5\' 7"  (1.702 m)   Wt 62.6 kg (138 lb 0.1 oz)   BMI 21.61 kg/m2   SpO2 99%   Breastfeeding? No    Temp (24hrs), Avg:98.1 ??F (36.7 ??C), Min:97.9 ??F (36.6 ??C), Max:98.2 ??F (36.8 ??C)       Physical Exam:  GENERAL: alert, cooperative, no distress, appears stated age, ABDOMEN: soft, non-tender. Bowel sounds normal. No masses,  no organomegaly      Intake/Output Summary (Last 24 hours) at 11/29/14 1134  Last data filed at 11/29/14 0930   Gross per 24 hour   Intake    572 ml   Output   2025 ml   Net  -1453 ml       Current Facility-Administered Medications   Medication Dose Route Frequency Provider Last Rate Last Dose   ??? pantoprazole (PROTONIX) tablet 40 mg  40 mg Oral BID Irving BurtonMuhamed Jasarevic, MD   40 mg at 11/29/14 0926   ??? sodium chloride (NS) flush 5-10 mL  5-10 mL IntraVENous PRN Kristie Cowmanoy P MacGregor, MD       ??? levofloxacin (LEVAQUIN) 750 mg in D5W IVPB  750 mg IntraVENous Q24H Kristie Cowmanoy P MacGregor, MD 100 mL/hr at 11/28/14 1654 750 mg at 11/28/14 1654   ??? ondansetron (ZOFRAN) injection 4 mg  4 mg IntraVENous Q6H PRN Meryle ReadyJohn Paul H Matriano, MD   4  mg at 11/27/14 1626   ??? glucagon (GLUCAGEN) injection 1 mg  1 mg IntraMUSCular PRN Meryle Ready, MD       ??? dextrose (D50W) injection syrg 10-15 g  20-30 mL IntraVENous PRN Meryle Ready, MD       ??? oxyCODONE IR (ROXICODONE) tablet 5 mg  5 mg Oral Q4H PRN Meryle Ready, MD   5 mg at 11/29/14 1610   ??? oxyCODONE IR (OXY-IR) immediate release tablet 15 mg  15 mg Oral Q4H PRN Meryle Ready, MD       ??? albuterol CONCENTRATE 2.5mg /0.5 mL neb soln  2.5 mg Nebulization Q4H PRN Meryle Ready, MD       ??? amitriptyline (ELAVIL) tablet 50 mg  50 mg Oral QHS Meryle Ready, MD   50 mg at 11/28/14 2232   ??? lamoTRIgine (LaMICtal) tablet 100 mg  100 mg Oral BID Meryle Ready, MD   100 mg at 11/29/14 9604   ??? diphenhydrAMINE (BENADRYL) injection 12.5 mg  12.5 mg IntraVENous PRN Meryle Ready, MD           CBC w/Diff   Lab Results   Component Value Date/Time    WBC 7.4 11/29/2014 02:42 AM    WBC 5.7 11/26/2014 03:27 PM     WBC 11.5* 10/28/2014 03:47 PM    RBC 4.45 11/29/2014 02:42 AM    RBC 3.00* 11/26/2014 03:27 PM    RBC 3.75 10/28/2014 03:47 PM    HGB 10.0* 11/29/2014 02:42 AM    HGB 8.5* 11/27/2014 05:26 PM    HGB 8.7* 11/27/2014 11:52 AM    HCT 35.1* 11/29/2014 02:42 AM    HCT 29.5* 11/27/2014 05:26 PM    HCT 28.7* 11/27/2014 11:52 AM    MCV 78.9* 11/29/2014 02:42 AM    MCV 73.7* 11/26/2014 03:27 PM    MCV 72.3* 10/28/2014 03:47 PM    MCH 22.5* 11/29/2014 02:42 AM    MCH 21.0* 11/26/2014 03:27 PM    MCH 21.9* 10/28/2014 03:47 PM    MCHC 28.5* 11/29/2014 02:42 AM    MCHC 28.5* 11/26/2014 03:27 PM    MCHC 30.3 10/28/2014 03:47 PM    RDW 17.1* 04/28/2011 09:01 PM    PLT 279 11/29/2014 02:42 AM    PLT 281 11/26/2014 03:27 PM    PLT 439 10/28/2014 03:47 PM     Lab Results   Component Value Date/Time    GRANS 56.4 11/29/2014 02:42 AM    GRANS 55.8 11/26/2014 03:27 PM    GRANS 79.1* 10/28/2014 03:47 PM    MONOS 11.7 11/29/2014 02:42 AM    MONOS 7.5 11/26/2014 03:27 PM    MONOS 5.1 10/28/2014 03:47 PM    EOS 5.9* 11/29/2014 02:42 AM    EOS 3.5 11/26/2014 03:27 PM    EOS 0.4 10/28/2014 03:47 PM    BASOS 0.9 11/29/2014 02:42 AM    BASOS 0.7 11/26/2014 03:27 PM    BASOS 0.5 10/28/2014 03:47 PM        Hepatic Function   Lab Results Component Value Date/Time    ALT 35 04/28/2011 09:01 PM    SGOT 25 04/28/2011 09:01 PM    TBILI 0.2 04/28/2011 09:01 PM    AP 95 04/28/2011 09:01 PM    ALB 2.8* 11/29/2014 02:42 AM    ALB 4.0 04/28/2011 09:01 PM    TP 7.0 04/28/2011 09:01 PM  Pancreatic Enzymes   No results found for: AML, LPSE     Basic Metabolic Profile   Lab Results   Component Value Date/Time    NA 142 11/29/2014 02:42 AM    NA 141 11/26/2014 04:29 PM    NA 137 10/28/2014 03:47 PM  K 4.0 11/29/2014 02:42 AM    K 4.1 11/26/2014 04:29 PM    K 3.6 10/28/2014 03:47 PM    CL 109* 11/29/2014 02:42 AM  CL 105 11/26/2014 04:29 PM    CL 107 10/28/2014 03:47 PM    CO2 27 11/29/2014 02:42 AM    CO2 22 11/26/2014 04:29 PM     CO2 23 10/28/2014 03:47 PM    CO2 26 09/09/2014 01:04 PM    CO2 25 09/01/2014 04:07 PM    CO2 31 04/28/2011 09:01 PM    BUN 5* 11/29/2014 02:42 AM    BUN 8 11/26/2014 04:29 PM    BUN 8 10/28/2014 03:47 PM    GLU 103 11/29/2014 02:42 AM    GLU 81 11/26/2014 04:29 PM    GLU 130* 10/28/2014 03:47 PM    CREA 0.7 11/29/2014 02:42 AM    CREA 0.7 11/26/2014 04:29 PM    CREA 0.8 10/28/2014 03:47 PM    CA 8.5 11/29/2014 02:42 AM    CA 8.8 10/28/2014 03:47 PM    CA 8.9 04/28/2011 09:01 PM    MG 1.8 11/29/2014 02:42 AM    PHOS 3.6 11/29/2014 02:42 AM        Coagulation   Lab Results   Component Value Date/Time    PTP 19.9* 11/28/2014 05:21 AM    PTP 21.9* 11/27/2014 07:06 AM    PTP 22.1* 11/26/2014 03:39 PM    INR 1.8* 11/28/2014 05:21 AM    INR 2.0* 11/27/2014 07:06 AM    INR 2.0* 11/26/2014 03:39 PM        Stool FOB No results found for: OB1, OB2, OB3, OCBL, POCOBL, SOB, OCCBLDEXT    Microbiology Results  No results for input(s): CULT in the last 72 hours.  No results found for: OVA, OVPA       Radiology Results  Xr Chest Pa Lat    11/26/2014   Clinical history: Chest pain  EXAMINATION: PA and lateral views of the chest 11/26/2014  Correlation: CT scan 04/17/2014  FINDINGS: Trachea and heart size are within normal limits. Lungs are clear. There is hyperinflation.     11/26/2014   IMPRESSION: COPD.     Ct Head Wo Cont    11/26/2014   Clinical history: Head injury, fall  EXAMINATION: CT scan of the head. 4.5 mm axial scanning is performed from the skull base to the vertex. Coronal and sagittal reconstruction imaging has been obtained.  Correlation: 10/28/2014  FINDINGS: There is mild mucosal thickening of the ethmoid sinuses. Frontal, sphenoid and maxillary sinuses. Patchy opacification of the left mastoid air cells. There are intracranial vascular calcifications.  Mild periventricular microvascular ischemia. No evidence of acute intracranial hemorrhage, mass or infarction. No extra-axial fluid collections.      11/26/2014   IMPRESSION: Mild periventricular microvascular ischemia.     Ct Spine Cerv Wo Cont    11/26/2014   Clinical history: Fall, pain  EXAMINATION:  CT scan of the cervical spine. 3 mm axial scanning is performed from the skull base to the C7-T1. Coronal and sagittal reconstruction imaging has been obtained.  FINDINGS: No prevertebral soft tissue swelling. Mild grade 1 anterior spondylolisthesis of C4 on  C5. Vertebral bodies maintain normal height. Atlantoaxial joint is within normal limits. Multilevel osteophyte disc complex and facet hypertrophic changes. No fracture cervical spine.     11/26/2014   IMPRESSION: 1. No fracture cervical spine. 2. Degenerative changes cervical spine.          Carlus Pavlov, NP  Gastroenterology [facility] [phone]  [facility] [phone]

## 2014-11-29 NOTE — Progress Notes (Addendum)
Discharge Plan: 3.30.2016    Anticipated Discharge Date:3.30.2016  Anticipated Discharge Transportation: patinet calling cab for herself.     The patient admitted to the hospital on 11/26/2014 with   Chief Complaint   Patient presents with   ??? Chest Pain (Angina)   ??? Fall   . Current   Patient Active Problem List   Diagnosis Code   ??? Rib pain R07.81   ??? Lupus (HCC) M32.9   ??? Osteoarthrosis, unspecified whether generalized or localized, unspecified site M19.90   ??? Neuralgia, neuritis, and radiculitis, unspecified [id number]   ??? Myalgia and myositis, unspecified M79.1, M60.9   ??? Pain in limb M79.609   ??? Pain in joint, multiple sites M25.50   ??? Type II or unspecified type diabetes mellitus without mention of complication, not stated as uncontrolled (HCC) E11.9   ??? 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 Encounter for long-term (current) use of other medications Z79.899   ??? Abdominal pain, generalized R10.84   ??? 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 Back pain, lumbosacral M54.5, M54.89   ??? GI bleed K92.2   .    Therapy Recommendations:    OT:    PT:    SLP:    RT Home O2 Evaluation:     Lines, Drains, and Airway:     IV Antibiotics:    Anticipated Duration of Antibiotic Therapy: None    PEG Tube:None    Dietician Recommendation: regualr diet    Dialysis Catheter:None    Dialysis Center, Days, and Chair Time:NA    Anticipated Discharge Facility/Agency updated on progress:NA    Patient is to be DC'd home with no needs.  Gave patient the phone numbers for cabs as patient wants to surprise her family and not call them for ride home from hospital.  All DC instructions, medication scripts and follow up appointment information will be given to the patient by the DC RN at time of DC.  No needs identified and declines HH at this time.

## 2014-11-29 NOTE — Discharge Summary (Signed)
Discharge Summary    Patient: [name] [name]               Sex: female          DOA: 11/26/2014         Date of Birth:  12/10/1950      Age:  64 y.o.        LOS:  LOS: 3 days                Admit Date: 11/26/2014    Discharge Date: 11/29/2014    Discharge Medications:     Current Discharge Medication List      START taking these medications    Details   ferrous gluconate 324 mg (36 mg iron) tab Take 1 Tab by mouth Before breakfast and dinner.  Qty: 60 Tab, Refills: 2         CONTINUE these medications which have NOT CHANGED    Details   hydroxychloroquine (PLAQUENIL) 200 mg tablet Take 200 mg by mouth two (2) times a day.      alendronate-vitamin d3 70-2,800 mg-unit per tablet Take 1 Tab by mouth every seven (7) days.      amitriptyline (ELAVIL) 75 mg tablet Take 1 Tab by mouth nightly.  Qty: 30 Tab, Refills: 1      albuterol (PROVENTIL HFA) 90 mcg/Actuation inhaler Take 1 Puff by inhalation as needed.      albuterol-ipratropium (DUO-NEB) 0.5 mg-3 mg(2.5 mg base)/3 mL nebulizer solution 3 mL by Nebulization route once.      warfarin (COUMADIN) 5 mg tablet Take 5 mg by mouth daily. Take 2 tablets (10mg )      Cholecalciferol, Vitamin D3, (VITAMIN D) 1,000 unit Cap Take 50,000 Units by mouth every seven (7) days.      omeprazole (PRILOSEC) 20 mg capsule Take 20 mg by mouth daily.         STOP taking these medications       ferrous sulfate 325 mg (65 mg iron) CpER Comments:   Reason for Stopping:               Follow-up:   GI one month     Discharge Condition: Stable    Activity: Activity as tolerated    Diet: Regular Diet    Labs:  Labs: Results:       Chemistry Recent Labs      11/29/14   0242  11/26/14   1629   GLU  103  81   NA  142  141   K  4.0  4.1   CL  109*  105   CO2  27  22   BUN  5*  8   CREA  0.7  0.7   CA  8.5   --    ALB  2.8*   --       CBC w/Diff Recent Labs      11/29/14   0242  11/27/14   1726  11/27/14   1152   11/26/14   1527   WBC  7.4   --    --    --   5.7   RBC  4.45   --    --    --   3.00*    HGB  10.0*  8.5*  8.7*   < >  6.3*   HCT  35.1*  29.5*  28.7*   < >  22.1*   PLT  279   --    --    --  281   GRANS  56.4   --    --    --   55.8   LYMPH  25.0*   --    --    --   32.3   EOS  5.9*   --    --    --   3.5    < > = values in this interval not displayed.      Cardiac Enzymes No results for input(s): CPK, CKND1, MYO in the last 72 hours.    Invalid input(s): CKRMB, TROIP   Coagulation Recent Labs      11/28/14   0521  11/27/14   0706   PTP  19.9*  21.9*   INR  1.8*  2.0*       Lipid Panel Lab Results   Component Value Date/Time    CHOLESTEROL, TOTAL 194 09/01/2014 08:23 PM    HDL CHOLESTEROL 69 09/01/2014 08:23 PM    LDL, CALCULATED 117 09/01/2014 08:23 PM    TRIGLYCERIDE 42 09/01/2014 08:23 PM    CHOL/HDL RATIO 2.8 09/01/2014 08:23 PM      BNP No results for input(s): BNPP in the last 72 hours.   Liver Enzymes Recent Labs      11/29/14   0242   ALB  2.8*      Thyroid Studies Lab Results   Component Value Date/Time    TSH 1.360 11/28/2014 05:21 AM          Imaging:  @     Consults: Gastroenterology    Treatment Team: Treatment Team: Attending Provider: Kristie Cowman, MD; Consulting Provider: Meryle Ready, MD; Consulting Provider: Lilia Pro, MD; Nurse Practitioner: Nelta Numbers, NP; Care Manager: Claudia Desanctis, RN; Nurse Practitioner: Jiles Crocker, NP    Hospital Course:     The patient is a very pleasant 64 year old female with history of gastrectomy, history of PE, DVT, seizure disorder ??  who comes in because of shortness of breath and generalized weakness.?? The ??  patient states that over the past 3 weeks she has had progressive generalized ??  weakness, shortness of breath on exertion.?? Earlier today she had ??  lightheadedness, increasing shortness of breath and chest pain.?? She decided ??  to go to the ER.?? En route, she was given sublingual nitro.???? When she arrived  ??in the ER, she was noted to have a hemoglobin of 6.3 and per ER report, they ??   checked her stool and it was positive for hemoccult blood. This result is ??  ??not available to me at this time.?? The patient denies ??  ??any vomiting blood.?? She states that she does not check her stool if there is  ??any blood or any tarry stool.?? She denies any abdominal pain.      Major issues addressed during hospitalization outlined  below.     Acute on chronic symptomatic anemia  -secondary to suspected  gastrointestinal?? bleed with?? acute blood loss anemia ??  -transfused 2 ??units packed RBC 3/27  -serial hemoglobin and hematocrit stable  -GI consultation appreciated, OPT f/u 1 month   -EGD with retained food, no bleed identified     UTI  -treated as INPT    History of deep venous thrombosis in 2011, pulmonary embolus in 2009.?? ??  -Coumadin restarted on d/c    Seizure disorder  -Continue home medications        Kristie Cowman, MD  November 29, 2014  Total time spent 26mn

## 2014-11-29 NOTE — Other (Signed)
Bedside and Verbal shift change report given to REBECCA DILLON HARDY, RN   (oncoming nurse) by April, RN (offgoing nurse). Report included the following information SBAR, Kardex, Intake/Output, MAR and Recent Results.

## 2014-11-30 MED FILL — SODIUM CHLORIDE 0.9 % IV: INTRAVENOUS | Qty: 200

## 2014-11-30 MED FILL — DIPRIVAN 10 MG/ML INTRAVENOUS EMULSION: 10 mg/mL | INTRAVENOUS | Qty: 110

## 2014-12-22 ENCOUNTER — Emergency Department: Payer: MEDICAID | Primary: Internal Medicine

## 2014-12-22 ENCOUNTER — Inpatient Hospital Stay: Admit: 2014-12-22 | Discharge: 2014-12-23 | Payer: MEDICAID | Attending: Emergency Medicine

## 2014-12-22 NOTE — ED Notes (Signed)
Pt initially c/o headache since 0830-0900, later in interview, pt now also c/o chest pain and says this chest pain started around 1030.

## 2014-12-22 NOTE — ED Notes (Signed)
Headache and chest pain. DM with neuropathy; GIB; multiple arthric pain; h/o DVT/P.E. Will order labs and send to main side.

## 2014-12-22 NOTE — ED Notes (Signed)
No answer when called to do labs

## 2015-01-01 ENCOUNTER — Emergency Department: Admit: 2015-01-01 | Payer: MEDICAID | Primary: Internal Medicine

## 2015-01-01 ENCOUNTER — Inpatient Hospital Stay
Admit: 2015-01-01 | Discharge: 2015-01-07 | Disposition: A | Discharge status: Home Health | Payer: MEDICAID | Attending: Internal Medicine | Admitting: Internal Medicine

## 2015-01-01 DIAGNOSIS — K2950 Unspecified chronic gastritis without bleeding: Secondary | ICD-10-CM

## 2015-01-01 LAB — CBC WITH AUTOMATED DIFF
BASOPHILS: 0.5 % (ref 0–3)
EOSINOPHILS: 0.8 % (ref 0–5)
HCT: 36.6 % — ABNORMAL LOW (ref 37.0–50.0)
HGB: 10.6 gm/dl — ABNORMAL LOW (ref 13.0–17.2)
IMMATURE GRANULOCYTES: 0.1 % (ref 0.0–3.0)
LYMPHOCYTES: 21.5 % — ABNORMAL LOW (ref 28–48)
MCH: 24 pg — ABNORMAL LOW (ref 25.4–34.6)
MCHC: 29 gm/dl — ABNORMAL LOW (ref 30.0–36.0)
MCV: 82.8 fL (ref 80.0–98.0)
MONOCYTES: 3.8 % (ref 1–13)
MPV: 11 fL — ABNORMAL HIGH (ref 6.0–10.0)
NEUTROPHILS: 73.3 % — ABNORMAL HIGH (ref 34–64)
NRBC: 0 (ref 0–0)
PLATELET COMMENTS: NORMAL
PLATELET: 244 10*3/uL (ref 140–450)
RBC: 4.42 M/uL (ref 3.60–5.20)
RDW-SD: 80.5 — CR (ref 36.4–46.3)
WBC: 7.4 10*3/uL (ref 4.0–11.0)

## 2015-01-01 LAB — PROTHROMBIN TIME + INR
INR: 1.6 — ABNORMAL HIGH (ref 0.0–1.1)
Prothrombin time: 18.1 seconds — ABNORMAL HIGH (ref 11.5–14.0)

## 2015-01-01 LAB — METABOLIC PANEL, COMPREHENSIVE
ALT (SGPT): 20 U/L (ref 12–78)
AST (SGOT): 27 U/L (ref 15–37)
Albumin: 3.5 gm/dl (ref 3.4–5.0)
Alk. phosphatase: 94 U/L (ref 45–117)
BUN: 9 mg/dl (ref 7–25)
Bilirubin, total: 0.3 mg/dl (ref 0.2–1.0)
CO2: 28 mEq/L (ref 21–32)
Calcium: 8.8 mg/dl (ref 8.5–10.1)
Chloride: 111 mEq/L — ABNORMAL HIGH (ref 98–107)
Creatinine: 0.6 mg/dl (ref 0.6–1.3)
GFR est AA: >60
GFR est non-AA: >60
Glucose: 81 mg/dl (ref 74–106)
Potassium: 3.4 mEq/L — ABNORMAL LOW (ref 3.5–5.1)
Protein, total: 7 gm/dl (ref 6.4–8.2)
Sodium: 144 mEq/L (ref 136–145)

## 2015-01-01 LAB — LIPASE: Lipase: 115 U/L (ref 73–393)

## 2015-01-01 LAB — BLOOD TYPE, (ABO+RH)
ABO/Rh(D): A POS
ABO/Rh: A POS

## 2015-01-01 LAB — POC TROPONIN: Troponin-I: 0 ng/ml (ref 0.00–0.07)

## 2015-01-01 LAB — ANTIBODY SCREEN: Antibody screen: NEGATIVE

## 2015-01-01 LAB — POC FECAL OCCULT BLOOD: Occult blood, stool: POSITIVE — AB

## 2015-01-01 MED ORDER — SODIUM CHLORIDE 0.9 % INJECTION
40 mg | INTRAMUSCULAR | Status: AC
Start: 2015-01-01 — End: 2015-01-01
  Administered 2015-01-01: via INTRAVENOUS

## 2015-01-01 MED ORDER — METOCLOPRAMIDE 5 MG/ML IJ SOLN
5 mg/mL | Freq: Once | INTRAMUSCULAR | Status: DC
Start: 2015-01-01 — End: 2015-01-01
  Administered 2015-01-01: 23:00:00 via INTRAVENOUS

## 2015-01-01 MED ORDER — ONDANSETRON (PF) 4 MG/2 ML INJECTION
4 mg/2 mL | Freq: Once | INTRAMUSCULAR | Status: AC
Start: 2015-01-01 — End: 2015-01-01
  Administered 2015-01-01: 22:00:00 via INTRAVENOUS

## 2015-01-01 MED ORDER — DIPHENHYDRAMINE HCL 50 MG/ML IJ SOLN
50 mg/mL | Freq: Once | INTRAMUSCULAR | Status: AC
Start: 2015-01-01 — End: 2015-01-01
  Administered 2015-01-01: via INTRAVENOUS

## 2015-01-01 MED ORDER — SODIUM CHLORIDE 0.9 % IJ SYRG
Freq: Once | INTRAMUSCULAR | Status: AC
Start: 2015-01-01 — End: 2015-01-01
  Administered 2015-01-01: 22:00:00 via INTRAVENOUS

## 2015-01-01 MED ORDER — MORPHINE 4 MG/ML SYRINGE
4 mg/mL | INTRAMUSCULAR | Status: AC
Start: 2015-01-01 — End: 2015-01-01
  Administered 2015-01-01: 22:00:00 via INTRAVENOUS

## 2015-01-01 MED ORDER — SODIUM CHLORIDE 0.9 % IV
5 mg/mL | Freq: Once | INTRAVENOUS | Status: DC
Start: 2015-01-01 — End: 2015-01-01
  Administered 2015-01-01: 23:00:00 via INTRAVENOUS

## 2015-01-01 MED ORDER — SODIUM CHLORIDE 0.9% BOLUS IV
0.9 % | INTRAVENOUS | Status: AC
Start: 2015-01-01 — End: 2015-01-01
  Administered 2015-01-01: 22:00:00 via INTRAVENOUS

## 2015-01-01 MED FILL — SODIUM CHLORIDE 0.9 % INJECTION: INTRAMUSCULAR | Qty: 20

## 2015-01-01 MED FILL — MORPHINE 4 MG/ML SYRINGE: 4 mg/mL | INTRAMUSCULAR | Qty: 1

## 2015-01-01 MED FILL — DIPHENHYDRAMINE HCL 50 MG/ML IJ SOLN: 50 mg/mL | INTRAMUSCULAR | Qty: 1

## 2015-01-01 MED FILL — ONDANSETRON (PF) 4 MG/2 ML INJECTION: 4 mg/2 mL | INTRAMUSCULAR | Qty: 2

## 2015-01-01 NOTE — Progress Notes (Addendum)
2100: Admission documentation complete, and assessment noted per flow sheet. Pt is awake, alert and oriented x4, denies chest pain/SOB. Pt with right arm weakness, states she has prior hx of stroke. Appears SR on the monitor, VSS. Skin is intact. Not in apparent distress. SCDs applied.    2110: BS noted to be 68. Hypoglycemia protocol initiated. D50 10g given per protocol. Pt now also complains of headache, requesting pain medication. MD paged and notified informed.    2205: IVF changed to D5 NS as ordered. Morphine administered per MAR orders.    2300: Paged MD on call, clarified note about "continuing Protonix drip", pt currently does not have order for Protonix infusion. Noted that pt was given 80mg  bolus of Protonix in ED. Awaiting for page back.    Bedside and Verbal shift change report given to Temmy RN (oncoming nurse) by Varney BaasM Coleman RN (offgoing nurse). Report included the following information SBAR, Kardex, MAR and Recent Results.

## 2015-01-01 NOTE — Other (Signed)
Bedside shift change report given to TEMITOPE A. THOMAS, RN (oncoming nurse) by M. COLEMAN RN (offgoing nurse). Report included the following information SBAR, Kardex and Cardiac Rhythm SR.

## 2015-01-01 NOTE — Other (Signed)
TRANSFER - OUT REPORT:    Verbal report given to [name] [name](name) on [name] [name]  being transferred to 2 [address] bed 2222(unit) for routine progression of care       Report consisted of patient???s Situation, Background, Assessment and   Recommendations(SBAR).     Information from the following report(s) Kardex, ED Summary and Recent Results was reviewed with the receiving nurse.    Lines:   Peripheral IV 01/01/15 Right Antecubital (Active)        Opportunity for questions and clarification was provided.      Patient transported with:   [employer]

## 2015-01-01 NOTE — H&P (Signed)
[facility]  History and Physical  NAME:  [name], [name]  SEX:   F  ADMIT: 01/01/2015  DOB:13-Nov-1950  MR#    [id number]  ROOM:  2222  ACCT#  [id number]    I hereby certify this patient for admission based upon medical necessity as   noted below:    <    CHIEF COMPLAINT:  Nausea, vomiting and diarrhea.    HISTORY OF PRESENT ILLNESS:  The patient is a 64 year old female with a past medical history of coronary   artery disease, hypertension, DVT, PE, gastric ulcer status post partial   gastrectomy, type 2 diabetes, seizure disorder and chronic pain, came to the   emergency room complaining of intractable nausea, vomiting and diarrhea.  The   patient was recently admitted on 11/26/2014  after she came with shortness of   breathing and generalized weakness.  She was found to have acute on chronic   symptomatic anemia during that admission and suspected to be secondary to   gastrointestinal bleeding.  She was transfused with 2 units of packed RBCs.    GI was consulted during that admission.  The patient had EGD during this   admission.  The EGD shows gastric retention. No significant finding was   noticed during that evaluation.  Now, she comes back to the emergency room   complaining that she has intractable nausea, vomiting and diarrhea.  She was   also found to have rectal bleeding.  The patient also has heme positive stool   in the emergency room.  GI has been consulted and she was admitted for further   evaluation and management.  The patient is on Coumadin for her history of PE   and DVT.    PAST MEDICAL HISTORY:  Gastric ulcer, CVA, DVT, PE, coronary artery disease status post stent,   seizure disorder, type 2 diabetes, hypertension, fibromyalgia, hypothyroidism.    PAST SURGICAL HISTORY:  Partial gastrectomy, hysterectomy, cholecystectomy.    ALLERGIES:  ASPIRIN, OPANA, TYLENOL.    HOME MEDICATIONS:  Ferrous gluconate 324 mg twice a day, Plaquenil 200 mg twice a day, Elavil 1    tablet at night, albuterol 1 puff p.r.n., DuoNeb p.r.n., Coumadin 5 mg daily,   cholecalciferol 50,000 units every 7 days.    FAMILY HISTORY:  Noncontributory.    SOCIAL HISTORY:  Denies smoking, alcohol or illicit drug use.    REVIEW OF SYSTEMS:  CONSTITUTIONAL:  No fever or chills.  EYES:  No visual disturbance.  EARS:  No hearing difficulty.  MOUTH:  No oral lesions.  NECK:  No neck stiffness.  CARDIOVASCULAR:  No chest pain or palpitation.  RESPIRATORY:  No cough or shortness of breathing.  GASTROINTESTINAL:  She has nausea, vomiting and diarrhea.  GENITOURINARY:  No dysuria, frequency or urgency.  SKIN:  No rash.  MUSCULOSKELETAL:  No joint swelling or pain.  NEUROLOGIC:  No headache or dizziness.    PHYSICAL EXAMINATION:  GENERAL:  The patient is not in acute distress.  VITAL SIGNS:  Blood pressure 128/115, pulse rate 76, respirations 20,   temperature 98.2, O2 saturation 99% on room air.  HEENT:  Pupils are equal and reactive to light.  Anicteric sclerae.  NECK:  No JVD.  CARDIOVASCULAR SYSTEM:  Regular rate and rhythm, S1-S2 well heard.  No murmur   or gallop.  LUNGS:  Clear to auscultation bilaterally.  ABDOMEN:  Soft and nontender, no distention, positive bowel sounds.  EXTREMITIES:  No edema or cyanosis.  SKIN:  No rash.  MUSCULOSKELETAL:  No joint swelling or tenderness.  NEUROLOGICAL:  Alert, oriented times 3, no focal neurologic deficit.    LABS:  WBC 7.4, hemoglobin 10.6, platelets 244.  INR is 1.6. Sodium 144, potassium   3.4, chloride 111, bicarbonate 28, BUN 9, creatinine 0.6.  Occult blood test   positive.    ASSESSMENT AND PLAN:  1.  Intractable nausea, vomiting, diarrhea with heme-positive stool, suspected   gastrointestinal bleeding.  2.  Chronic iron deficiency anemia.  3.  History of pulmonary embolism and deep venous thrombosis, on Coumadin.  4.  History of gastric ulcer, status post partial gastrectomy.  5.  Coronary artery disease, status post stents.  6.  Hypertension, uncontrolled.   7.  Chronic pain with fibromyalgia.    PLAN:  The patient will be admitted to telemetry floor.  We will continue to monitor   hemoglobin and hematocrit, continue Protonix drip.  GI has been consulted by   ER physician. Continue gentle IV fluid hydration.  Continue her breathing   treatments and home medication.  We will hold off her Coumadin for now.  We   will continue to monitor and further recommendation will follow.      ___________________  Theodis Aguas MD  Dictated By: .   Edmonia Caprio  D:01/01/2015 22:08:04  T: 01/01/2015 22:30:39  [id number]

## 2015-01-02 LAB — EKG, 12 LEAD, INITIAL
Atrial Rate: 79 {beats}/min
Calculated P Axis: 68 degrees
Calculated R Axis: 66 degrees
Calculated T Axis: 38 degrees
P-R Interval: 134 ms
Q-T Interval: 392 ms
QRS Duration: 86 ms
QTC Calculation (Bezet): 449 ms
Ventricular Rate: 79 {beats}/min

## 2015-01-02 LAB — GLUCOSE, POC
Glucose (POC): 112 mg/dL — ABNORMAL HIGH (ref 65–105)
Glucose (POC): 119 mg/dL — ABNORMAL HIGH (ref 65–105)
Glucose (POC): 68 mg/dL (ref 65–105)
Glucose (POC): 82 mg/dL (ref 65–105)
Glucose (POC): 95 mg/dL (ref 65–105)
Glucose (POC): 96 mg/dL (ref 65–105)

## 2015-01-02 LAB — HGB & HCT
HCT: 32 % — ABNORMAL LOW (ref 37.0–50.0)
HCT: 29.7 % — ABNORMAL LOW (ref 37.0–50.0)
HGB: 9.3 gm/dl — ABNORMAL LOW (ref 13.0–17.2)
HGB: 8.6 gm/dl — ABNORMAL LOW (ref 13.0–17.2)

## 2015-01-02 LAB — FERRITIN: Ferritin: 14 ng/ml (ref 8.0–252.0)

## 2015-01-02 MED ORDER — IPRATROPIUM-ALBUTEROL 2.5 MG-0.5 MG/3 ML NEB SOLUTION
2.5 mg-0.5 mg/3 ml | Freq: Four times a day (QID) | RESPIRATORY_TRACT | Status: DC
Start: 2015-01-02 — End: 2015-01-04
  Administered 2015-01-02 – 2015-01-04 (×6): via RESPIRATORY_TRACT

## 2015-01-02 MED ORDER — DEXTROSE 5% IN NORMAL SALINE IV
INTRAVENOUS | Status: DC
Start: 2015-01-02 — End: 2015-01-07
  Administered 2015-01-02 – 2015-01-07 (×10): via INTRAVENOUS

## 2015-01-02 MED ORDER — MORPHINE 2 MG/ML INJECTION
2 mg/mL | INTRAMUSCULAR | Status: DC | PRN
Start: 2015-01-02 — End: 2015-01-07
  Administered 2015-01-02 – 2015-01-07 (×18): via INTRAVENOUS

## 2015-01-02 MED ORDER — ONDANSETRON (PF) 4 MG/2 ML INJECTION
4 mg/2 mL | INTRAMUSCULAR | Status: DC | PRN
Start: 2015-01-02 — End: 2015-01-01

## 2015-01-02 MED ORDER — DEXTROSE 5% IN NORMAL SALINE IV
INTRAVENOUS | Status: DC
Start: 2015-01-02 — End: 2015-01-02
  Administered 2015-01-02: 02:00:00 via INTRAVENOUS

## 2015-01-02 MED ORDER — MORPHINE 4 MG/ML SYRINGE
4 mg/mL | INTRAMUSCULAR | Status: AC | PRN
Start: 2015-01-02 — End: 2015-01-02
  Administered 2015-01-02 (×3): via INTRAVENOUS

## 2015-01-02 MED ORDER — PANTOPRAZOLE 40 MG IV SOLR
40 mg | INTRAVENOUS | Status: DC
Start: 2015-01-02 — End: 2015-01-02
  Administered 2015-01-02 (×2): via INTRAVENOUS

## 2015-01-02 MED ORDER — SODIUM CHLORIDE 0.9 % IJ SYRG
INTRAMUSCULAR | Status: AC
Start: 2015-01-02 — End: 2015-01-02
  Administered 2015-01-02: 13:00:00

## 2015-01-02 MED ORDER — ONDANSETRON (PF) 4 MG/2 ML INJECTION
4 mg/2 mL | INTRAMUSCULAR | Status: DC | PRN
Start: 2015-01-02 — End: 2015-01-04
  Administered 2015-01-02: 13:00:00 via INTRAVENOUS

## 2015-01-02 MED ORDER — NALOXONE 0.4 MG/ML INJECTION
0.4 mg/mL | INTRAMUSCULAR | Status: DC | PRN
Start: 2015-01-02 — End: 2015-01-07

## 2015-01-02 MED ORDER — ALBUTEROL SULFATE HFA 90 MCG/ACTUATION AEROSOL INHALER
90 mcg/actuation | RESPIRATORY_TRACT | Status: DC | PRN
Start: 2015-01-02 — End: 2015-01-07

## 2015-01-02 MED ORDER — HYDROXYCHLOROQUINE 200 MG TAB
200 mg | Freq: Two times a day (BID) | ORAL | Status: DC
Start: 2015-01-02 — End: 2015-01-07
  Administered 2015-01-02 – 2015-01-07 (×11): via ORAL

## 2015-01-02 MED ORDER — SODIUM CHLORIDE 0.9 % IJ SYRG
INTRAMUSCULAR | Status: AC
Start: 2015-01-02 — End: 2015-01-02
  Administered 2015-01-02: 17:00:00

## 2015-01-02 MED ORDER — SODIUM CHLORIDE 0.9 % IV
INTRAVENOUS | Status: DC
Start: 2015-01-02 — End: 2015-01-01

## 2015-01-02 MED ORDER — SODIUM CHLORIDE 0.9 % IV
INTRAVENOUS | Status: DC
Start: 2015-01-02 — End: 2015-01-01
  Administered 2015-01-02: 01:00:00 via INTRAVENOUS

## 2015-01-02 MED ORDER — LORAZEPAM 2 MG/ML IJ SOLN
2 mg/mL | Freq: Three times a day (TID) | INTRAMUSCULAR | Status: DC | PRN
Start: 2015-01-02 — End: 2015-01-07
  Administered 2015-01-02 – 2015-01-07 (×8): via INTRAVENOUS

## 2015-01-02 MED ORDER — GLUCAGON 1 MG INJECTION
1 mg | INTRAMUSCULAR | Status: DC | PRN
Start: 2015-01-02 — End: 2015-01-07

## 2015-01-02 MED ORDER — DEXTROSE 50% IN WATER (D50W) IV SYRG
INTRAVENOUS | Status: DC | PRN
Start: 2015-01-02 — End: 2015-01-07
  Administered 2015-01-02: 01:00:00 via INTRAVENOUS

## 2015-01-02 MED ORDER — PANTOPRAZOLE 40 MG IV SOLR
40 mg | Freq: Two times a day (BID) | INTRAVENOUS | Status: DC
Start: 2015-01-02 — End: 2015-01-07
  Administered 2015-01-02 – 2015-01-07 (×11): via INTRAVENOUS

## 2015-01-02 MED FILL — MORPHINE 4 MG/ML SYRINGE: 4 mg/mL | INTRAMUSCULAR | Qty: 1

## 2015-01-02 MED FILL — DEXTROSE 5% IN NORMAL SALINE IV: INTRAVENOUS | Qty: 1000

## 2015-01-02 MED FILL — VENTOLIN HFA 90 MCG/ACTUATION AEROSOL INHALER: 90 mcg/actuation | RESPIRATORY_TRACT | Qty: 8

## 2015-01-02 MED FILL — MORPHINE 2 MG/ML INJECTION: 2 mg/mL | INTRAMUSCULAR | Qty: 1

## 2015-01-02 MED FILL — PROTONIX 40 MG INTRAVENOUS SOLUTION: 40 mg | INTRAVENOUS | Qty: 80

## 2015-01-02 MED FILL — DEXTROSE 50% IN WATER (D50W) IV SYRG: INTRAVENOUS | Qty: 50

## 2015-01-02 MED FILL — ONDANSETRON (PF) 4 MG/2 ML INJECTION: 4 mg/2 mL | INTRAMUSCULAR | Qty: 2

## 2015-01-02 MED FILL — IPRATROPIUM-ALBUTEROL 2.5 MG-0.5 MG/3 ML NEB SOLUTION: 2.5 mg-0.5 mg/3 ml | RESPIRATORY_TRACT | Qty: 3

## 2015-01-02 MED FILL — BD POSIFLUSH NORMAL SALINE 0.9 % INJECTION SYRINGE: INTRAMUSCULAR | Qty: 10

## 2015-01-02 MED FILL — LORAZEPAM 2 MG/ML IJ SOLN: 2 mg/mL | INTRAMUSCULAR | Qty: 1

## 2015-01-02 MED FILL — HYDROXYCHLOROQUINE 200 MG TAB: 200 mg | ORAL | Qty: 1

## 2015-01-02 MED FILL — SODIUM CHLORIDE 0.9 % IV: INTRAVENOUS | Qty: 1000

## 2015-01-02 MED FILL — SODIUM CHLORIDE 0.9 % INJECTION: INTRAMUSCULAR | Qty: 10

## 2015-01-02 NOTE — Progress Notes (Signed)
Patient is anxious and tearful about hospitalization, comforted with therapeutic reassurance. Dr. Tenny Crawerefe called and notified  new orders received.

## 2015-01-02 NOTE — Consults (Signed)
A member of [facility]    Consult Note    IMPRESSION:       1. Acute nausea, vomiting r/o GOO, PUD, erosive esophagitis/gastritis, neoplasm.   2. Chronic anemia r/o occult GI bleeding. Hemoccult positive brown stool on rectal exam.   3. Epigastric pain  4. Hemoccult positive brown stool on rectal exam, no melena. ??  5. Weight loss - 4 dress sizes since January. ??  6. 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 ??  7. S/P partial gastrectomy 10-15 years ago in [place], [place] for recurrent PUD. ??  8. Co morbidities: Lupus, DM II, seizure disorder, fibromyalgia, vertigo, chronic pain, HTN, CABG w/ stents.  ????  RECOMMENDATIONS:??   ????  ?? NPO, may have ice chips until midnight.  ?? EGD tomorrow by Dr. Federico Flake.   ?? DC Protonix drip, give 40 mg every 12 hours.  ?? Check stool for C diff, H pylori, and culture.                HPI:     I was asked by Dr. Tenny Craw to evaluate [name] [name] for acute, nausea, vomiting, and diarrhea for 3 days.  She is a 64 y.o. female admitted on 01/01/2015 . Patient reports she started vomiting 3 days ago with 1-2 loose stools daily; she has had a poor appetite w/ early satiety, reduced oral intake, and an unintentional weight loss (4 dress sizes) since January 2016. She was hospitalized in March of 2016 for symptomatic anemia and weight loss. EGD 11/27/2104 findings of retained food at anastamosis, friable margin however, full obscured by food. Patient was to f/u in our office in April for Pill Cam, but she has not "felt well enough to have any more tests". Screening colonoscopy 06/30/2014 by Dr. Floyde Parkins findings of diverticulosis, internal hemorrhoids; recommended 10 year follow up. Patient denies melena, hematochezia, hematemesis, dysphagia, odynophagia, fevers, sick contacts, recent antibiotic/steroid therapy. She appears nontoxic and is hemodynamically stable on exam.   Patient Active Problem List    Diagnosis Date Noted    ??? Gastrointestinal hemorrhage 01/01/2015   ??? GI bleed 11/26/2014   ??? Back pain, lumbosacral 09/11/2011   ??? Arthritis of knee 01/04/2011   ??? Encounter for long-term (current) use of other medications 01/04/2011   ??? Abdominal pain, generalized 01/04/2011   ??? 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 Lupus (HCC) 12/03/2010   ??? Osteoarthrosis, unspecified whether generalized or localized, unspecified site 12/03/2010   ??? Neuralgia, neuritis, and radiculitis, unspecified 12/03/2010   ??? Myalgia and myositis, unspecified 12/03/2010   ??? Pain in limb 12/03/2010   ??? Pain in joint, multiple sites 12/03/2010   ??? Type II or unspecified type diabetes mellitus without mention of complication, not stated as uncontrolled (HCC) 12/03/2010   ??? Rib pain 10/10/2010      Past Medical History   Diagnosis Date   ??? Seizure (HCC)    ??? Diabetes mellitus    ??? Stroke [facility])    ??? DVT (deep venous thrombosis) (HCC)    ??? Adjustment disorder with mixed anxiety and depressed mood    ??? Aneurysm of internal carotid artery    ??? Backache    ??? Cervicalgia    ??? Chronic pain syndrome    ??? Coccydynia    ??? Fibromyalgia    ??? Generalized osteoarthritis of multiple sites    ??? Hemiparesis, right (HCC)    ??? Hyperlipidemia    ???  Irregular sleep-wake rhythm    ??? Knee joint pain    ??? Lumbar spondylosis    ??? Muscle spasm    ??? Osteoporosis    ??? Pulmonary embolism (HCC)    ??? Subclinical hypothyroidism    ??? Seizures (HCC)    ??? Autoimmune disease (HCC)    ??? Pancreatitis      Allergies   Allergen Reactions   ??? Aspirin Not Reported This Time     Because of hx ulcers per patient   ??? Opana [Oxymorphone] Rash and Itching   ??? Tylenol [Acetaminophen] Itching       Prior to Admission Medications   Prescriptions Last Dose Informant Patient Reported? Taking?   Cholecalciferol, Vitamin D3, (VITAMIN D) 1,000 unit Cap 12/25/2014 at Unknown time  Yes Yes   Sig: Take 50,000 Units by mouth every seven (7) days.    albuterol (PROVENTIL HFA) 90 mcg/Actuation inhaler 12/25/2014 at Unknown time  Yes Yes   Sig: Take 1 Puff by inhalation as needed.   albuterol-ipratropium (DUO-NEB) 0.5 mg-3 mg(2.5 mg base)/3 mL nebulizer solution 12/25/2014 at Unknown time  Yes Yes   Sig: 3 mL by Nebulization route once.   alendronate-vitamin d3 70-2,800 mg-unit per tablet 12/25/2014 at Unknown time  Yes Yes Sig: Take 1 Tab by mouth every seven (7) days.   amitriptyline (ELAVIL) 75 mg tablet 12/31/2014 at Unknown time  No Yes   Sig: Take 1 Tab by mouth nightly.   Patient taking differently: Take 50 mg by mouth nightly.   ferrous gluconate 324 mg (36 mg iron) tab 12/29/2014 at Unknown time  No Yes   Sig: Take 1 Tab by mouth Before breakfast and dinner.   hydroxychloroquine (PLAQUENIL) 200 mg tablet 12/22/2014 at Unknown time  Yes Yes   Sig: Take 200 mg by mouth two (2) times a day.   levETIRAcetam (KEPPRA) 750 mg tablet   Yes Yes   Sig: Take  by mouth two (2) times a day.   oxyCODONE IR (OXY-IR) 15 mg immediate release tablet   Yes Yes   Sig: Take 15 mg by mouth four (4) times daily.   warfarin (COUMADIN) 5 mg tablet 12/31/2014 at Unknown time  Yes Yes   Sig: Take 5 mg by mouth daily. Take 2 tablets (10mg )      Facility-Administered Medications: None     Current Facility-Administered Medications   Medication Dose Route Frequency Provider Last Rate Last Dose   ??? sodium chloride (NS) 0.9 % flush            ??? pantoprazole (PROTONIX) 40 mg in sodium chloride 0.9 % 10 mL injection  40 mg IntraVENous Q12H Lilliahna Schubring L Delford FieldWright, NP       ??? dextrose 5% and 0.9% NaCl infusion  100 mL/hr IntraVENous CONTINUOUS Theodis AguasYared G Terefe, MD 100 mL/hr at 01/02/15 1103 100 mL/hr at 01/02/15 1103   ??? morphine injection 2-4 mg  2-4 mg IntraVENous Q4H PRN Theodis AguasYared G Terefe, MD   2 mg at 01/02/15 1103   ??? ondansetron (ZOFRAN) injection 4 mg  4 mg IntraVENous Q4H PRN Smitty CordsErik H Kisa, MD   4 mg at 01/02/15 [id number]   ??? albuterol (PROVENTIL HFA, VENTOLIN HFA, PROAIR HFA) inhaler 1 Puff  1  Puff Inhalation Q4H PRN Theodis AguasYared G Terefe, MD       ??? albuterol-ipratropium (DUO-NEB) 2.5 MG-0.5 MG/3 ML  3 mL Nebulization Q6H RT Theodis AguasYared G Terefe, MD   3 mL at 01/02/15 0800   ??? hydroxychloroquine (  PLAQUENIL) tablet 200 mg  200 mg Oral BID Theodis Aguas, MD   Stopped at 01/02/15 [phone]   ??? naloxone (NARCAN) injection 0.1 mg  0.1 mg IntraVENous PRN Theodis Aguas, MD       ??? glucagon (GLUCAGEN) injection 1 mg  1 mg IntraMUSCular PRN Theodis Aguas, MD       ??? dextrose (D50W) injection syrg 10-15 g  20-30 mL IntraVENous PRN Theodis Aguas, MD   10 g at 01/01/15 2106     Past Surgical History   Procedure Laterality Date   ??? Hx hysterectomy     ??? Hx cholecystectomy     ??? Hx colectomy     ??? Pr cardiac surg procedure unlist       Family History   Problem Relation Age of Onset   ??? Diabetes Maternal Grandmother      History     Social History   ??? Marital Status: DIVORCED     Spouse Name: N/A   ??? Number of Children: N/A   ??? Years of Education: N/A     Occupational History   ??? Not on file.     Social History Main Topics   ??? Smoking status: Former Smoker   ??? Smokeless tobacco: Never Used   ??? Alcohol Use: No   ??? Drug Use: No   ??? Sexual Activity: Not on file     Other Topics Concern   ??? Not on file     Social History Narrative       Review of Systems:  Constitutional: negative  Eyes: negative  Ears, nose, mouth, throat, and face: negative  Respiratory: DOE in exertion.  Cardiovascular: negative  Gastrointestinal: epigastric pain, nausea, vomiting, and diarrhea x 3 days.   Musculoskeletal: myalgias.   Behavioral/Psych: anxiety.       Objective:     BP 115/72 mmHg   Pulse 65   Temp(Src) 98.1 ??F (36.7 ??C)   Resp 18   Ht 5\' 7"  (1.702 m)   Wt 59.2 kg (130 lb 8.2 oz)   BMI 20.44 kg/m2   SpO2 99%  Temp (24hrs), Avg:98.2 ??F (36.8 ??C), Min:98.1 ??F (36.7 ??C), Max:98.4 ??F (36.9 ??C)    Filed Vitals:    01/02/15 9604 01/02/15 0756 01/02/15 0801 01/02/15 1005   BP: 97/69   115/72   Pulse: 63   65   Temp: 98.3 ??F (36.8 ??C)   98.1 ??F (36.7 ??C)    Resp: 20   18   Height: 5\' 7"  (1.702 m)      Weight: 59.2 kg (130 lb 8.2 oz)      SpO2: 97% 98% 98% 99%       Intake/Output Summary (Last 24 hours) at 01/02/15 1213  Last data filed at 01/02/15 0955   Gross per 24 hour   Intake 836.67 ml   Output      0 ml   Net 836.67 ml       Physical Exam:  GENERAL: alert, cooperative, no distress, EYE: negative, THROAT & NECK: normal and no erythema or exudates noted. , LUNG: clear to auscultation bilaterally, HEART: S1, S2 regular, ABDOMEN: soft, tender over epigartium. Bowel sounds normal. EXTREMITIES:  extremities normal, atraumatic, no cyanosis or edema, SKIN: Normal., PSYCHIATRIC: non focal.       DATA    CBC w/Diff   Lab Results   Component Value Date/Time    WBC 7.4 01/01/2015 05:04 PM    WBC 7.4 11/29/2014  02:42 AM    WBC 5.7 11/26/2014 03:27 PM    RBC 4.42 01/01/2015 05:04 PM  RBC 4.45 11/29/2014 02:42 AM    RBC 3.00* 11/26/2014 03:27 PM    HGB 8.6* 01/02/2015 10:23 AM    HGB 9.3* 01/01/2015 11:46 PM    HGB 10.6* 01/01/2015 05:04 PM    HCT 29.7* 01/02/2015 10:23 AM    HCT 32.0* 01/01/2015 11:46 PM    HCT 36.6* 01/01/2015 05:04 PM    MCV 82.8 01/01/2015 05:04 PM    MCV 78.9* 11/29/2014 02:42 AM    MCV 73.7* 11/26/2014 03:27 PM    MCH 24.0* 01/01/2015 05:04 PM    MCH 22.5* 11/29/2014 02:42 AM    MCH 21.0* 11/26/2014 03:27 PM    MCHC 29.0* 01/01/2015 05:04 PM    MCHC 28.5* 11/29/2014 02:42 AM    MCHC 28.5* 11/26/2014 03:27 PM    RDW 17.1* 04/28/2011 09:01 PM    PLT 244 01/01/2015 05:04 PM    PLT 279 11/29/2014 02:42 AM    PLT 281 11/26/2014 03:27 PM     Lab Results   Component Value Date/Time    GRANS 73.3* 01/01/2015 05:04 PM    GRANS 56.4 11/29/2014 02:42 AM    GRANS 55.8 11/26/2014 03:27 PM    MONOS 3.8 01/01/2015 05:04 PM    MONOS 11.7 11/29/2014 02:42 AM    MONOS 7.5 11/26/2014 03:27 PM    EOS 0.8 01/01/2015 05:04 PM    EOS 5.9* 11/29/2014 02:42 AM    EOS 3.5 11/26/2014 03:27 PM    BASOS 0.5 01/01/2015 05:04 PM    BASOS 0.9 11/29/2014 02:42 AM     BASOS 0.7 11/26/2014 03:27 PM      Hepatic Function   Recent Labs      01/01/15   1704   ALT  20   SGOT  27   TBILI  0.3   AP  94   ALB  3.5   TP  7.0        Pancreatic Enzymes   Recent Labs      01/01/15   1704   LPSE  115        Basic Metabolic Profile   Recent Labs      01/01/15   1704   NA  144   K  3.4*   CL  111*   CO2  28   BUN  9   GLU  81   CREA  0.6   CA  8.8        POC Glucose  No components found for: [facility]    Coagulation   Recent Labs      01/01/15   1704   PTP  18.1*   INR  1.6*          Stool FOB Lab Results   Component Value Date/Time    OCCULT BLOOD, STOOL positive 01/01/2015 04:34 PM       Microbiology Results      Radiology Results  Xr Chest Pa Lat    01/01/2015   History: Chest pain, right-sided chest pain  Comparison: 11/26/2014;   Exam: Frontal and lateral chest.  Results:   Heart normal.  Mediastinal contours are normal.  No consolidation.  No pleural effusions.    Mild apical pleural thickening in the right.  No pneumothorax.  No lung  masses.  No free air is seen under the hemidiaphragms.   Osseous structures intact. Surgical clips upper abdomen.  01/01/2015   IMPRESSION:  No acute disease.         Nelta Numbers, NP, FNP-BC  [facility]  [phone] (c)  [facility] [phone]  [place] Office [phone]

## 2015-01-02 NOTE — ED Provider Notes (Signed)
[facility]  EMERGENCY DEPARTMENT TREATMENT REPORT  NAME:  [name] [name], [name] [name]  SEX:   F  ADMIT: 01/01/2015  DOB:   1951/07/09  MR#    [id number]  ROOM:  2222  TIME DICTATED: 06 00 PM  ACCT#  [id number]    cc: Abbey Chatters MD    I hereby certify this patient for admission based upon medical necessity as   noted below:      TIME OF EVALUATION:  1613    PRIMARY CARE PROVIDER:  Dr. Laverle Patter     CHIEF COMPLAINT:  Headache.    HISTORY OF PRESENT ILLNESS:  This is a 64 year old female presenting to the Emergency Department today   mainly secondary to vomiting and diarrhea.  The patient numerous past medical   history including a history of gastric ulcers, recently admitted for this   requiring blood transfusion.  She is also on Coumadin for history of DVT and   PE.  The patient reports that today he began having numerous episodes of   vomiting and diarrhea.  Denies any hematemesis, but does state her stool is   dark in color.  She attributes this to being on an iron supplementation.  She   is denying any kind of abdominal pain but does report increasing headache   described as banding around her head with some mild photophobia.  She denies   any fever but is reporting chills.  She does state that, over the past couple   of days, she has been experiencing increasing chest pain with shortness of   breath and, due to all the above complaints, here for further evaluation.    REVIEW OF SYSTEMS:  CONSTITUTIONAL:  Denies fever but does report chills.  EYES:   No visual symptoms.  ENT:  No sore throat, runny nose, or other URI symptoms.  RESPIRATORY:   Positive for shortness of breath.  CARDIOVASCULAR:  Positive for chest pain.  GASTROINTESTINAL:  Denies abdominal pain, positive for vomiting and diarrhea.  GENITOURINARY:  No dysuria.  MUSCULOSKELETAL:  Reports body aches.  History of fibromyalgia.  INTEGUMENTARY:  No rashes.  NEUROLOGIC:  Positive for headache.    PAST MEDICAL HISTORY:   Seizure disorder, diabetes, CVA, DVT, PE, chronic pain, osteoporosis,   hysterectomy, cholecystectomy, partial colectomy.    SOCIAL HISTORY:  The patient is a former smoker.    FAMILY HISTORY:  Diabetes.    MEDICATIONS:  Reviewed.    ALLERGIES:  REVIEWED.    PHYSICAL EXAMINATION:  VITAL SIGNS:  Temperature 98.2, pulse 76, respirations 20, blood pressure   128/115, O2 saturation 99% on room air.  GENERAL:  The patient well nourished, well developed.  She is thin but answers   questions appropriately.  HEENT:  Eyes:  Conjunctivae clear, lids normal.  Pupils equal, symmetrical,   and normally reactive.  ENT:  Mouth:  Mucus membranes somewhat dry.  RESPIRATORY:  Clear and equal breath sounds.  No respiratory distress,   tachypnea, or accessory muscle use.    CARDIOVASCULAR:   Heart regular, without murmurs, gallops, rubs, or thrills.    C hest wall with  mild reproducible tenderness.  EXTREMITIES:  Without edema, no calf tenderness.  GASTROINTESTINAL:  Normoactive bowel sounds.  Abdomen is soft.  Tender to   palpation in the left upper quadrant.  No rebound or guarding is appreciated.  Rectal examination performed.  The patient does have external hemorrhoids that   are noted.  Stool is brown, but is guaiac positive.  MUSCULOSKELETAL:  Moving all extremities.  SKIN:  No visualized rashes.  NEUROLOGIC:  Alert, oriented.  Sensation intact, motor strength equal and   symmetric.      CONTINUATION BY Vinnie LangtonERIK Lerone Onder, MD:    INITIAL ASSESSMENT AND MANAGEMENT PLAN:  The patient presents here with having vomiting and diarrhea.  Stool specimen   was checked here, was brown and heme-positive.  She is on Coumadin.  Has   history of bleeding ulcer in the past.  Please see previous records.  The   patient had to be placed on Coumadin for anticoagulation for a pulmonary   embolism.      LABORATORY TESTS:  The patient had a white count of 7.4, hematocrit and hemoglobin of 36 and 10,    stable from before.  Platelet count adequate at 244.  INR was subtherapeutic   at 1.6.  Electrolytes essentially normal.  Normal lipase, 113.  Normal   troponin, 0.     COURSE IN EMERGENCY DEPARTMENT:  The patient did not have any further vomiting or diarrhea during her stay   here.  She was hydrated with IV fluids though, given Protonix 80 mg IV,   Benadryl was given.  She felt much more comfortable.  She received 4 mg of   morphine and Zofran also.  She had no further complaints during her stay.  Dr.   Tenny Crawerefe came and admitted the patient to his service.  We had paged for Dr.   Marcial PacasSperling's group as he had seen her before, Dr. Dorothyann Gibbsicketts called back and took   the consult.     CLINICAL IMPRESSION:  1.  Gastrointestinal hemorrhage associated with gastric ulcer.   2.  Dehydration, moderate.  3.  Non-intractable vomiting with nausea, vomiting of unspecified type.  4.  Diarrhea.      ___________________  Smitty CordsErik H Bartosz Luginbill MD  Dictated By: Kristeen MansBrittany Irwin, PA    My signature above authenticates this document and my orders, the final   diagnosis (es), discharge prescription (s), and instructions in the Epic   record.  If you have any questions please contact [phone].    Nursing notes have been reviewed by the physician/ advanced practice   clinician.    LH  D:01/01/2015 18:00:54  T: 01/02/2015 10:04:32  [id number]

## 2015-01-02 NOTE — Progress Notes (Signed)
Ice chips given, patient  tolerating well.

## 2015-01-02 NOTE — Progress Notes (Signed)
Dr. Terefe at bedside.

## 2015-01-02 NOTE — Other (Signed)
Bedside and Verbal shift change report given to Dorcas Mcmurrayourtney Brown-Appiah RN (oncoming nurse) by Mervyn Skeetersemi Thomas (offgoing nurse). Report included the following information SBAR, MAR, Accordion, Recent Results and Cardiac Rhythm NSR.

## 2015-01-02 NOTE — Progress Notes (Signed)
Current discharge plan is home with no needs.  Patient has used [facility] in the past and [facility]    Care Management Interventions  PCP Verified by CM: Yes (Dr. Abbey Chattersaniel Bluestein)  Mode of Transport at Discharge: ALS (family can pick up patient for discharge)  Discharge Durable Medical Equipment: Yes (walker and cane in the home)  Current Support Network: Other (granddaughter lives with patient)  Confirm Follow Up Transport: Family  Confirm Transport and Arrange: No  Plan discussed with Pt/Family/Caregiver: Yes  Freedom of Choice Offered: Yes  Discharge Location  Discharge Placement: Home

## 2015-01-02 NOTE — Other (Signed)
Bedside and Verbal shift change report given to Vickey SagesKareema Payne, RN (oncoming nurse) by Dorcas Mcmurrayourtney Brown-Appiah RN (offgoing nurse). Report included the following information SBAR, MAR, Accordion, Recent Results and Cardiac Rhythm NSR.

## 2015-01-02 NOTE — Progress Notes (Addendum)
Medical Progress Note      NAME: [name] [name]   DOB:  [birth year]  MRN:             [id number]    Date/Time: 01/02/2015  10:46 AM      Assessment:     1. Intractable nausea, vomiting, diarrhea   2. Suspected gastrointestinal bleeding.  3. Chronic iron deficiency anemia.   4. History of PE and DVT, on Coumadin.   5. History of gastric ulcer, status post partial gastrectomy.  6. CAD, status post stents.  7. Hypertension, uncontrolled.  8. Chronic pain with fibromyalgia.    Plan:     ?? GI consult appreciated  ?? Keep NPO  ?? IV protonix  ?? Monitor H/H  ?? Continue IVF, Iv pain meds  ?? Resume home meds  ?? Hold coumadin, monitor INR  ?? D/w GI and RN    Subjective:     Headache, nausea, no hematemesis or melena    Objective:     Vitals:      Last 24hrs VS reviewed since prior progress note. Most recent are:  Visit Vitals   Item Reading   ??? BP 115/72 mmHg   ??? Pulse 65   ??? Temp 98.1 ??F (36.7 ??C)   ??? Resp 18   ??? Ht $Remo'5\' 7"'MMHEK$  (1.702 m)   ??? Wt 59.2 kg (130 lb 8.2 oz)   ??? BMI 20.44 kg/m2   ??? SpO2 99%     SpO2 Readings from Last 6 Encounters:   01/02/15 99%   12/22/14 100%   11/29/14 100%   06/15/13 100%   03/29/12 98%   01/20/12 100%          Intake/Output Summary (Last 24 hours) at 01/02/15 1046  Last data filed at 01/02/15 0955   Gross per 24 hour   Intake 836.67 ml   Output      0 ml   Net 836.67 ml        Exam:     General:   Not in acute distress  HEENT: PERRLA, Neck Supple,  No JVD  Respiratory:   CTA bilaterally-no wheezes, rales, rhonchi, or crackles  Cardiac:  Regular Rate and Rythmn  - no murmurs, rubs or gallops  Abdominal:  Soft, non-tender, non-distended, positive bowel sounds  Extremities:  No cyanosis, or edema.  Skin: No rash  Neurological:  No focal neurological deficits    Medication:     Current Medications Reviewed    Current facility-administered medications:   ???  sodium chloride (NS) 0.9 % flush, , , ,   ???  pantoprazole (PROTONIX) 40 mg in sodium chloride 0.9 % 10 mL injection,  40 mg, IntraVENous, Q12H, Amy L Joya Gaskins, NP  ???  ondansetron (ZOFRAN) injection 4 mg, 4 mg, IntraVENous, Q4H PRN, Lennox Laity, MD, 4 mg at 01/02/15 0981  ???  albuterol (PROVENTIL HFA, VENTOLIN HFA, PROAIR HFA) inhaler 1 Puff, 1 Puff, Inhalation, Q4H PRN, Stanton Kidney, MD  ???  albuterol-ipratropium (DUO-NEB) 2.5 MG-0.5 MG/3 ML, 3 mL, Nebulization, Q6H RT, Stanton Kidney, MD, 3 mL at 01/02/15 0800  ???  hydroxychloroquine (PLAQUENIL) tablet 200 mg, 200 mg, Oral, BID, Stanton Kidney, MD, Stopped at 01/02/15 [phone]  ???  naloxone (NARCAN) injection 0.1 mg, 0.1 mg, IntraVENous, PRN, Stanton Kidney, MD  ???  glucagon (GLUCAGEN) injection 1 mg, 1 mg, IntraMUSCular, PRN, Stanton Kidney, MD  ???  dextrose (D50W) injection syrg 10-15 g, 20-30 mL, IntraVENous,  PRN, Stanton Kidney, MD, 10 g at 01/01/15 2106    Lab:     Lab Data Reviewed: (see below)  Recent Results (from the past 24 hour(s))   EKG, 12 LEAD, INITIAL    Collection Time: 01/01/15  4:29 PM   Result Value Ref Range    Ventricular Rate 79 BPM    Atrial Rate 79 BPM    P-R Interval 134 ms    QRS Duration 86 ms    Q-T Interval 392 ms    QTC Calculation (Bezet) 449 ms    Calculated P Axis 68 degrees    Calculated R Axis 66 degrees    Calculated T Axis 38 degrees    Diagnosis       Sinus rhythm with sinus arrhythmia with frequent premature ventricular   complexes  Otherwise normal ECG  When compared with ECG of 26-Nov-2014 15:01,  premature ventricular complexes are now present  Nonspecific T wave abnormality now evident in Anterior leads  Confirmed by Sabra Heck, M.D., Edward ([id number]) on 01/02/2015 7:25:32 AM     POC FECAL OCCULT BLOOD    Collection Time: 01/01/15  4:34 PM   Result Value Ref Range    Occult blood, stool positive (A) NEGATIVE,Negative     CBC WITH AUTOMATED DIFF    Collection Time: 01/01/15  5:04 PM   Result Value Ref Range    WBC 7.4 4.0 - 11.0 1000/mm3    RBC 4.42 3.60 - 5.20 M/uL    HGB 10.6 (L) 13.0 - 17.2 gm/dl    HCT 36.6 (L) 37.0 - 50.0 %    MCV 82.8 80.0 - 98.0 fL     MCH 24.0 (L) 25.4 - 34.6 pg    MCHC 29.0 (L) 30.0 - 36.0 gm/dl    PLATELET 244 140 - 450 1000/mm3    MPV 11.0 (H) 6.0 - 10.0 fL    RDW-SD 80.5 (HH) 36.4 - 46.3      NRBC 0 0 - 0      IMMATURE GRANULOCYTES 0.1 0.0 - 3.0 %    NEUTROPHILS 73.3 (H) 34 - 64 %    LYMPHOCYTES 21.5 (L) 28 - 48 %    MONOCYTES 3.8 1 - 13 %    EOSINOPHILS 0.8 0 - 5 %    BASOPHILS 0.5 0 - 3 %    Poikilocytosis 1+      ANISOCYTOSIS 2+      HYPOCHROMASIA 1+      Schistocytes 1+      Elliptocytes 1+      PLATELET COMMENTS NORMAL     METABOLIC PANEL, COMPREHENSIVE    Collection Time: 01/01/15  5:04 PM   Result Value Ref Range    Sodium 144 136 - 145 mEq/L    Potassium 3.4 (L) 3.5 - 5.1 mEq/L    Chloride 111 (H) 98 - 107 mEq/L    CO2 28 21 - 32 mEq/L    Glucose 81 74 - 106 mg/dl    BUN 9 7 - 25 mg/dl    Creatinine 0.6 0.6 - 1.3 mg/dl    GFR est AA >60.0      GFR est non-AA >60      Calcium 8.8 8.5 - 10.1 mg/dl    AST 27 15 - 37 U/L    ALT 20 12 - 78 U/L    Alk. phosphatase 94 45 - 117 U/L    Bilirubin, total 0.3 0.2 - 1.0 mg/dl    Protein, total 7.0  6.4 - 8.2 gm/dl    Albumin 3.5 3.4 - 5.0 gm/dl   LIPASE    Collection Time: 01/01/15  5:04 PM   Result Value Ref Range    Lipase 115 73 - 393 U/L   PROTHROMBIN TIME + INR    Collection Time: 01/01/15  5:04 PM   Result Value Ref Range    Prothrombin time 18.1 (H) 11.5 - 14.0 seconds    INR 1.6 (H) 0.0 - 1.1     TYPE, ABO & RH    Collection Time: 01/01/15  5:04 PM   Result Value Ref Range    ABO/Rh(D) A Rh Positive     ANTIBODY SCREEN    Collection Time: 01/01/15  5:04 PM   Result Value Ref Range    Antibody screen NEG     POC TROPONIN-I    Collection Time: 01/01/15  5:24 PM   Result Value Ref Range    Troponin-I 0.00 0.00 - 0.07 ng/ml   GLUCOSE, POC    Collection Time: 01/01/15  8:11 PM   Result Value Ref Range    Glucose (POC) 68 65 - 105 mg/dL   GLUCOSE, POC    Collection Time: 01/01/15  8:59 PM   Result Value Ref Range    Glucose (POC) 82 65 - 105 mg/dL   GLUCOSE, POC     Collection Time: 01/01/15 10:05 PM   Result Value Ref Range    Glucose (POC) 95 65 - 105 mg/dL   HGB & HCT    Collection Time: 01/01/15 11:46 PM   Result Value Ref Range    HGB 9.3 (L) 13.0 - 17.2 gm/dl    HCT 32.0 (L) 37.0 - 50.0 %   GLUCOSE, POC    Collection Time: 01/02/15  8:39 AM   Result Value Ref Range    Glucose (POC) 112 (H) 65 - 105 mg/dL   HGB & HCT    Collection Time: 01/02/15 10:23 AM   Result Value Ref Range    HGB 8.6 (L) 13.0 - 17.2 gm/dl    HCT 29.7 (L) 37.0 - 50.0 %       Xr Chest Pa Lat    01/01/2015   History: Chest pain, right-sided chest pain  Comparison: 11/26/2014;   Exam: Frontal and lateral chest.  Results:   Heart normal.  Mediastinal contours are normal.  No consolidation.  No pleural effusions.    Mild apical pleural thickening in the right.  No pneumothorax.  No lung  masses.  No free air is seen under the hemidiaphragms.   Osseous structures intact. Surgical clips upper abdomen.     01/01/2015   IMPRESSION:  No acute disease.       Active Problems:    Gastrointestinal hemorrhage (01/01/2015)        Prophylaxis:  Lovenox  Coumadin  Hep SQ  SCD???s  H2B/PPI    Disposition:  Home w/ Family   HH PT,OT,RN   SNF/LTC   SAH/Rehab    Care Plan discussed with:    Patient   Family    Care Manager  ED Doc   Specialist :       _______________________________________________________________________        Attending Physician: Stanton Kidney, MD      [facility]

## 2015-01-03 LAB — CBC WITH AUTOMATED DIFF
BASOPHILS: 0.7 % (ref 0–3)
EOSINOPHILS: 2.9 % (ref 0–5)
HCT: 28.3 % — ABNORMAL LOW (ref 37.0–50.0)
HGB: 8.4 gm/dl — ABNORMAL LOW (ref 13.0–17.2)
IMMATURE GRANULOCYTES: 0.2 % (ref 0.0–3.0)
LYMPHOCYTES: 37.1 % (ref 28–48)
MCH: 24.3 pg — ABNORMAL LOW (ref 25.4–34.6)
MCHC: 29.7 gm/dl — ABNORMAL LOW (ref 30.0–36.0)
MCV: 81.8 fL (ref 80.0–98.0)
MONOCYTES: 10.7 % (ref 1–13)
MPV: 10.6 fL — ABNORMAL HIGH (ref 6.0–10.0)
NEUTROPHILS: 48.4 % (ref 34–64)
NRBC: 0 (ref 0–0)
PLATELET COMMENTS: NORMAL
PLATELET: 167 10*3/uL (ref 140–450)
RBC: 3.46 M/uL — ABNORMAL LOW (ref 3.60–5.20)
RDW-SD: 80.1 — CR (ref 36.4–46.3)
WBC: 4.2 10*3/uL (ref 4.0–11.0)

## 2015-01-03 LAB — PROTHROMBIN TIME + INR
INR: 1.2 — ABNORMAL HIGH (ref 0.0–1.1)
Prothrombin time: 15 seconds — ABNORMAL HIGH (ref 11.5–14.0)

## 2015-01-03 LAB — METABOLIC PANEL, COMPREHENSIVE
ALT (SGPT): 13 U/L (ref 12–78)
AST (SGOT): 13 U/L — ABNORMAL LOW (ref 15–37)
Albumin: 2.5 gm/dl — ABNORMAL LOW (ref 3.4–5.0)
Alk. phosphatase: 63 U/L (ref 45–117)
BUN: 3 mg/dl — ABNORMAL LOW (ref 7–25)
Bilirubin, total: 0.3 mg/dl (ref 0.2–1.0)
CO2: 26 mEq/L (ref 21–32)
Calcium: 7.9 mg/dl — ABNORMAL LOW (ref 8.5–10.1)
Chloride: 112 mEq/L — ABNORMAL HIGH (ref 98–107)
Creatinine: 0.5 mg/dl — ABNORMAL LOW (ref 0.6–1.3)
GFR est AA: >60
GFR est non-AA: >60
Glucose: 108 mg/dl — ABNORMAL HIGH (ref 74–106)
Potassium: 3.5 mEq/L (ref 3.5–5.1)
Protein, total: 5.3 gm/dl — ABNORMAL LOW (ref 6.4–8.2)
Sodium: 145 mEq/L (ref 136–145)

## 2015-01-03 LAB — GLUCOSE, POC
Glucose (POC): 132 mg/dL — ABNORMAL HIGH (ref 65–105)
Glucose (POC): 99 mg/dL (ref 65–105)
Glucose (POC): 82 mg/dL (ref 65–105)
Glucose (POC): 91 mg/dL (ref 65–105)

## 2015-01-03 LAB — HGB & HCT
HCT: 31.1 % — ABNORMAL LOW (ref 37.0–50.0)
HCT: 29.7 % — ABNORMAL LOW (ref 37.0–50.0)
HGB: 8.9 gm/dl — ABNORMAL LOW (ref 13.0–17.2)
HGB: 8.5 gm/dl — ABNORMAL LOW (ref 13.0–17.2)

## 2015-01-03 LAB — MAGNESIUM: Magnesium: 1.5 mg/dl — ABNORMAL LOW (ref 1.8–2.4)

## 2015-01-03 MED ORDER — PROPOFOL 10 MG/ML IV EMUL
10 mg/mL | INTRAVENOUS | Status: DC | PRN
Start: 2015-01-03 — End: 2015-01-03
  Administered 2015-01-03: 13:00:00 via INTRAVENOUS

## 2015-01-03 MED ORDER — FLUOXETINE 20 MG CAP
20 mg | Freq: Every day | ORAL | Status: DC
Start: 2015-01-03 — End: 2015-01-06
  Administered 2015-01-03 – 2015-01-06 (×4): via ORAL

## 2015-01-03 MED ORDER — SODIUM CHLORIDE 0.9 % IJ SYRG
INTRAMUSCULAR | Status: AC
Start: 2015-01-03 — End: 2015-01-02
  Administered 2015-01-03

## 2015-01-03 MED ORDER — SUCRALFATE 100 MG/ML ORAL SUSP
100 mg/mL | Freq: Two times a day (BID) | ORAL | Status: DC
Start: 2015-01-03 — End: 2015-01-07
  Administered 2015-01-03 – 2015-01-07 (×9): via ORAL

## 2015-01-03 MED ORDER — PROPOFOL 10 MG/ML IV EMUL
10 mg/mL | INTRAVENOUS | Status: DC | PRN
Start: 2015-01-03 — End: 2015-01-03
  Administered 2015-01-03 (×3): via INTRAVENOUS

## 2015-01-03 MED ORDER — SODIUM CHLORIDE 0.9 % IJ SYRG
INTRAMUSCULAR | Status: DC | PRN
Start: 2015-01-03 — End: 2015-01-03

## 2015-01-03 MED ORDER — LIDOCAINE 4 % TOPICAL SOLN
4 % (0 mg/mL) | Status: DC | PRN
Start: 2015-01-03 — End: 2015-01-03
  Administered 2015-01-03: 13:00:00 via NASAL

## 2015-01-03 MED ORDER — SODIUM CHLORIDE 0.9 % IJ SYRG
Freq: Three times a day (TID) | INTRAMUSCULAR | Status: DC
Start: 2015-01-03 — End: 2015-01-03
  Administered 2015-01-03: 15:00:00 via INTRAVENOUS

## 2015-01-03 MED ORDER — SODIUM CHLORIDE 0.9 % IV
INTRAVENOUS | Status: AC
Start: 2015-01-03 — End: 2015-01-03
  Administered 2015-01-03: 13:00:00 via INTRAVENOUS

## 2015-01-03 MED ORDER — SODIUM CHLORIDE 0.9 % IJ SYRG
INTRAMUSCULAR | Status: AC
Start: 2015-01-03 — End: 2015-01-03
  Administered 2015-01-03: 15:00:00

## 2015-01-03 MED ORDER — SODIUM CHLORIDE 0.9 % IV
INTRAVENOUS | Status: DC | PRN
Start: 2015-01-03 — End: 2015-01-03
  Administered 2015-01-03: 13:00:00 via INTRAVENOUS

## 2015-01-03 MED FILL — SUCRALFATE 100 MG/ML ORAL SUSP: 100 mg/mL | ORAL | Qty: 20

## 2015-01-03 MED FILL — IPRATROPIUM-ALBUTEROL 2.5 MG-0.5 MG/3 ML NEB SOLUTION: 2.5 mg-0.5 mg/3 ml | RESPIRATORY_TRACT | Qty: 3

## 2015-01-03 MED FILL — MORPHINE 2 MG/ML INJECTION: 2 mg/mL | INTRAMUSCULAR | Qty: 1

## 2015-01-03 MED FILL — HYDROXYCHLOROQUINE 200 MG TAB: 200 mg | ORAL | Qty: 1

## 2015-01-03 MED FILL — SODIUM CHLORIDE 0.9 % INJECTION: INTRAMUSCULAR | Qty: 10

## 2015-01-03 MED FILL — DIPRIVAN 10 MG/ML INTRAVENOUS EMULSION: 10 mg/mL | INTRAVENOUS | Qty: 70

## 2015-01-03 MED FILL — LIDOCAINE 4 % TOPICAL SOLN: 4 % (0 mg/mL) | Qty: 3

## 2015-01-03 MED FILL — FLUOXETINE 20 MG CAP: 20 mg | ORAL | Qty: 1

## 2015-01-03 MED FILL — LORAZEPAM 2 MG/ML IJ SOLN: 2 mg/mL | INTRAMUSCULAR | Qty: 1

## 2015-01-03 MED FILL — BD POSIFLUSH NORMAL SALINE 0.9 % INJECTION SYRINGE: INTRAMUSCULAR | Qty: 20

## 2015-01-03 MED FILL — SODIUM CHLORIDE 0.9 % IV: INTRAVENOUS | Qty: 1000

## 2015-01-03 MED FILL — DIPRIVAN 10 MG/ML INTRAVENOUS EMULSION: 10 mg/mL | INTRAVENOUS | Qty: 43.61

## 2015-01-03 MED FILL — BD POSIFLUSH NORMAL SALINE 0.9 % INJECTION SYRINGE: INTRAMUSCULAR | Qty: 10

## 2015-01-03 NOTE — Procedures (Signed)
Location: [facility]              Esophagogastroduodenoscopy (EGD) Procedure Note    Impression: s/p B-2 gastric resection  Gastritis in small remnant,  bx of Jejunum r/o Celiac Disease and of stomach    Recommendations:  Nutrition consult for post gastric resection diet  -add Carafate Elixier 2 g bid  ( see full note)    Procedure Date:  Jan 03, 2015  Procedure:  EGD   Indications: weight loss  Scope: Olympus GIF  Endoscopist: Noland FordyceBruce Sahan Pen MD   PCP Madelyn BrunnerANIEL A BLUESTEIN, MD  Non-physician Assistants: Muhr RN  Informed Consent:  The risks, benefits and alternatives of the procedure and the sedation options were discussed with the patient. The patient is aware of potential complications including but not limited to bleeding, perforation, aspiration, infection, sedation adverse events, missed diagnosis, missed lesions, and intravenous site complications.  Informed consent is documented in the medical record.    Pre-Procedure Assessment:  A current history and physical is on the chart.  Patient's medication allergies were reviewed.  The patient's tolerance of previous anesthesia was also reviewed.  Immediately prior to administration of medications, the patient was reassessed for adequacy to receive sedatives.  The assessment is as follows:  ASA 2 Mallampatti 2  Sedation:  MAC  The medications were administered incrementally over the course of the procedure to achieve an adequate level of sedation.    Procedure Details:  The patient was monitored continuously with ECG tracing, pulse oximetry, blood pressure monitoring and direct observations. The patient was placed in left lateral decubitus position. The endoscope was inserted into the mouth and advanced under direct vision to proximal jejunum.  A careful inspection was made as the endoscope was withdrawn, including a retroflexed view of the proximal stomach.     Findings and interventions are described below.  Appropriate photo documentation was obtained. There were no  apparent complications.  Estimated Blood Loss:  No clinically significant blood loss  Findings:   See full note B -2 The esophagus /DID NOT: have fluid which was removed with suction.  The esophagus appeared dilated.  .  The LES was snug.  There was no esophagitis, Barrett's esophagus, stricture or varices.  The stomach was normal  Post B-2 exceptwith  Gastritis no, ulcer or mass.   The jejunal folds were grossly normal.    To view endoscopic images in EPIC go to the Chart Review Activity > Media Tab > The document type you're searching for is "endo picture" and should be filed on the day of the procedure.      Noland FordyceBruce Adien Kimmel MD  [facility]  [place] Office [phone]  [facility] [phone]

## 2015-01-03 NOTE — Other (Signed)
Bedside and Verbal shift change report given to Dorcas Mcmurrayourtney Brown-APpiah RN (oncoming nurse) by Vickey SagesKareema Payne (offgoing nurse). Report included the following information SBAR, Accordion, Recent Results and Cardiac Rhythm NSR.

## 2015-01-03 NOTE — Progress Notes (Signed)
Patient off unit via stretcher stable to endo for EGD

## 2015-01-03 NOTE — Consults (Addendum)
GI Consult Note    IMPRESSION:   Patient seen, examined, and discussed with  Lu DuffelAmy Wright, NP , and agree as above    Noland FordyceBruce Madi Bonfiglio MDPatient seen, examined, and discussed with  Lu DuffelAmy Wright, NP , and agree as above  Discussed with pt, has neg colon by Dr Floyde ParkinsJohn  Smith limited view of stomach with retained food on last egd r/o mass , stricture ulcer    Noland FordyceBruce Patric Vanpelt MD  1. N/V - ? Gastroenteritis    2. Weight loss - reported 30 lb wt loss in 1 month  3. Acute on chronic anemia - Hgb 8.6 on admission  4. IDA - r/o GI bleeding source. EGD today - consider colonoscopy/M2A based on findings. Included in differential is Celiac sprue.  5. Heme + stool - Ddx: gastric/duodenal ulcer, ?anastomotic ulcer, malignancy.   6. On Coumadin - hx cardiac stent  7.   Hx gastric ulcer w partial gastrectomy    RECOMMENDATIONS:     ?? Npo  ?? EGD today  ?? Protonix IV bid  ?? Hold Coumadin  ?? Trend H&H    HPI:     I was asked by Dr. Tenny Crawerefe to evaluate [name] [name] for GI bleeding, N/V.  She is a 64 y.o. female admitted on 01/01/2015 for Gastrointestinal hemorrhage  NAUSEA/VOMITING WT. LOSS.  Presents with nausea, vomiting, and recent unintentional weight loss. Nausea/vomiting and chills occuring for 2 days now - reports being unable to hold down any food or beverage. Denies abdominal pain, diarrhea, or fevers. Reports 30 lb weight loss in the last month. Also found to be anemic on arrival w Hgb 8.6 - Fe studies during last admission in March 2016 (for GI bleeding) c/w IDA. EGD was attempted at that time - retained food noted with no visible ulceration. Last colonoscopy approx one year ago [facility] - reported as normal (no records available). She has a hx of bleeding ulcer w partial gastrectomy many years ago. She is currently on Coumadin for hx cardiac stent. She denies melena or BRBPR, however, stool was found to be heme positive on exam in ED. Currently hemodynamically stable.     Patient Active Problem List     Diagnosis Date Noted   ??? Gastrointestinal hemorrhage 01/01/2015   ??? GI bleed 11/26/2014   ??? Back pain, lumbosacral 09/11/2011   ??? Arthritis of knee 01/04/2011   ??? Encounter for long-term (current) use of other medications 01/04/2011   ??? Abdominal pain, generalized 01/04/2011   ??? 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 Lupus (HCC) 12/03/2010   ??? Osteoarthrosis, unspecified whether generalized or localized, unspecified site 12/03/2010   ??? Neuralgia, neuritis, and radiculitis, unspecified 12/03/2010   ??? Myalgia and myositis, unspecified 12/03/2010   ??? Pain in limb 12/03/2010   ??? Pain in joint, multiple sites 12/03/2010   ??? Type II or unspecified type diabetes mellitus without mention of complication, not stated as uncontrolled (HCC) 12/03/2010   ??? Rib pain 10/10/2010      Past Medical History   Diagnosis Date   ??? Seizure (HCC)    ??? Diabetes mellitus    ??? Stroke [facility](HCC)    ??? DVT (deep venous thrombosis) (HCC)    ??? Adjustment disorder with mixed anxiety and depressed mood    ??? Aneurysm of internal carotid artery    ??? Backache    ??? Cervicalgia    ??? Chronic pain syndrome    ???  Coccydynia    ??? Fibromyalgia    ??? Generalized osteoarthritis of multiple sites    ??? Hemiparesis, right (HCC)    ??? Hyperlipidemia    ??? Irregular sleep-wake rhythm    ??? Knee joint pain    ??? Lumbar spondylosis    ??? Muscle spasm    ??? Osteoporosis    ??? Pulmonary embolism (HCC)    ??? Subclinical hypothyroidism    ??? Seizures (HCC)    ??? Autoimmune disease (HCC)    ??? Pancreatitis      Allergies   Allergen Reactions   ??? Aspirin Not Reported This Time     Because of hx ulcers per patient   ??? Opana [Oxymorphone] Rash and Itching   ??? Tylenol [Acetaminophen] Itching       Prior to Admission Medications   Prescriptions Last Dose Informant Patient Reported? Taking?   Cholecalciferol, Vitamin D3, (VITAMIN D) 1,000 unit Cap 12/25/2014 at Unknown time  Yes Yes   Sig: Take 50,000 Units by mouth every seven (7) days.    albuterol (PROVENTIL HFA) 90 mcg/Actuation inhaler 12/25/2014 at Unknown time  Yes Yes   Sig: Take 1 Puff by inhalation as needed.   albuterol-ipratropium (DUO-NEB) 0.5 mg-3 mg(2.5 mg base)/3 mL nebulizer solution 12/25/2014 at Unknown time  Yes Yes   Sig: 3 mL by Nebulization route once.   alendronate-vitamin d3 70-2,800 mg-unit per tablet 12/25/2014 at Unknown time  Yes Yes   Sig: Take 1 Tab by mouth every seven (7) days.   amitriptyline (ELAVIL) 75 mg tablet 12/31/2014 at Unknown time  No Yes   Sig: Take 1 Tab by mouth nightly.   Patient taking differently: Take 50 mg by mouth nightly.   ferrous gluconate 324 mg (36 mg iron) tab 12/29/2014 at Unknown time  No Yes   Sig: Take 1 Tab by mouth Before breakfast and dinner.   hydroxychloroquine (PLAQUENIL) 200 mg tablet 12/22/2014 at Unknown time  Yes Yes   Sig: Take 200 mg by mouth two (2) times a day.   levETIRAcetam (KEPPRA) 750 mg tablet   Yes Yes   Sig: Take  by mouth two (2) times a day.   oxyCODONE IR (OXY-IR) 15 mg immediate release tablet   Yes Yes   Sig: Take 15 mg by mouth four (4) times daily.   warfarin (COUMADIN) 5 mg tablet 12/31/2014 at Unknown time  Yes Yes   Sig: Take 5 mg by mouth daily. Take 2 tablets ( )      Facility-Administered Medications: None     Current Facility-Administered Medications   Medication Dose Route Frequency Provider Last Rate Last Dose   ??? pantoprazole (PROTONIX) 40 mg in sodium chloride 0.9 % 10 mL injection  40 mg IntraVENous Q12H Amy L Delford Field, NP   40 mg at 01/02/15 2227   ??? dextrose 5% and 0.9% NaCl infusion  100 mL/hr IntraVENous CONTINUOUS Theodis Aguas, MD 100 mL/hr at 01/03/15 0704 100 mL/hr at 01/03/15 0704   ??? morphine injection 2-4 mg  2-4 mg IntraVENous Q4H PRN Theodis Aguas, MD   2 mg at 01/03/15 0658   ??? LORazepam (ATIVAN) injection 0.5 mg  0.5 mg IntraVENous Q8H PRN Theodis Aguas, MD   0.5 mg at 01/02/15 2228   ??? ondansetron (ZOFRAN) injection 4 mg  4 mg IntraVENous Q4H PRN Smitty Cords, MD   4 mg at 01/02/15 1610    ??? albuterol (PROVENTIL HFA, VENTOLIN HFA, PROAIR HFA) inhaler 1 Puff  1 Puff Inhalation Q4H PRN Theodis Aguas, MD       ??? albuterol-ipratropium (DUO-NEB) 2.5 MG-0.5 MG/3 ML  3 mL Nebulization Q6H RT Theodis Aguas, MD   Stopped at 01/03/15 0800   ??? hydroxychloroquine (PLAQUENIL) tablet 200 mg  200 mg Oral BID Theodis Aguas, MD   200 mg at 01/02/15 2227   ??? naloxone (NARCAN) injection 0.1 mg  0.1 mg IntraVENous PRN Theodis Aguas, MD       ??? glucagon (GLUCAGEN) injection 1 mg  1 mg IntraMUSCular PRN Theodis Aguas, MD       ??? dextrose (D50W) injection syrg 10-15 g  20-30 mL IntraVENous PRN Theodis Aguas, MD   10 g at 01/01/15 2106     Facility-Administered Medications Ordered in Other Encounters   Medication Dose Route Frequency Provider Last Rate Last Dose   ??? 0.9% sodium chloride infusion   IntraVENous CONTINUOUS Elicia Lamp, CRNA       ??? lidocaine (XYLOCAINE) 4 % (40 mg/mL) topical solution   Aerosolization PRN Elicia Lamp, CRNA   3 mL at 01/03/15 1610     Past Surgical History   Procedure Laterality Date   ??? Hx hysterectomy     ??? Hx cholecystectomy     ??? Hx colectomy     ??? Pr cardiac surg procedure unlist       Family History   Problem Relation Age of Onset   ??? Diabetes Maternal Grandmother      History     Social History   ??? Marital Status: DIVORCED     Spouse Name: N/A   ??? Number of Children: N/A   ??? Years of Education: N/A     Occupational History   ??? Not on file.     Social History Main Topics   ??? Smoking status: Former Smoker   ??? Smokeless tobacco: Never Used   ??? Alcohol Use: No   ??? Drug Use: No   ??? Sexual Activity: Not on file     Other Topics Concern   ??? Not on file     Social History Narrative       Review of Systems:  Constitutional: positive for chills, malaise and weight loss  Eyes: negative  Ears, nose, mouth, throat, and face: negative  Respiratory: negative  Cardiovascular: negative  Gastrointestinal: positive for nausea, vomiting and constipation, negative  for dysphagia, odynophagia, reflux symptoms, melena, diarrhea, abdominal pain and jaundice  Musculoskeletal:negative  Behavioral/Psych: negative      Objective:     BP 125/67 mmHg   Pulse 59   Temp(Src) 98.2 ??F (36.8 ??C)   Resp 18   Ht  (1.702 m)   Wt 62.3 kg (137 lb 5.6 oz)   BMI 21.51 kg/m2   SpO2 96%  Temp (24hrs), Avg:98 ??F (36.7 ??C), Min:97.3 ??F (36.3 ??C), Max:98.7 ??F (37.1 ??C)    Filed Vitals:    01/03/15 0011 01/03/15 0655 01/03/15 0736 01/03/15 0819   BP: 119/61 118/77  125/67   Pulse: 77 68  59   Temp: 98.3 ??F (36.8 ??C) 97.3 ??F (36.3 ??C)  98.2 ??F (36.8 ??C)   Resp: Height:       Weight:  62.3 kg (137 lb 5.6 oz)     SpO2: 100% 98% 99% 96%       Intake/Output Summary (Last 24 hours) at 01/03/15 0903  Last data filed at 01/03/15 [phone]  Gross per 24 hour   Intake 2356.67 ml   Output    450 ml   Net 1906.67 ml       Physical Exam:  GENERAL: alert, cooperative, no distress, appears stated age, EYE: negative, THROAT & NECK: normal and no erythema or exudates noted. , LUNG: clear to auscultation bilaterally, HEART: regular rate and rhythm, S1, S2 normal, no murmur, click, rub or gallop, ABDOMEN: soft, non-tender. Bowel sounds normal. No masses,  no organomegaly, midline scar, EXTREMITIES:  extremities normal, atraumatic, no cyanosis or edema, SKIN: Normal., PSYCHIATRIC: non focal, judgment intact      DATA    CBC w/Diff   Lab Results   Component Value Date/Time    WBC 4.2 01/03/2015 02:36 AM    WBC 7.4 01/01/2015 05:04 PM    WBC 7.4 11/29/2014 02:42 AM    RBC 3.46* 01/03/2015 02:36 AM    RBC 4.42 01/01/2015 05:04 PM    RBC 4.45 11/29/2014 02:42 AM    HGB 8.4* 01/03/2015 02:36 AM    HGB 8.9* 01/02/2015 09:55 PM    HGB 8.6* 01/02/2015 10:23 AM    HCT 28.3* 01/03/2015 02:36 AM    HCT 31.1* 01/02/2015 09:55 PM    HCT 29.7* 01/02/2015 10:23 AM    MCV 81.8 01/03/2015 02:36 AM  MCV 82.8 01/01/2015 05:04 PM    MCV 78.9* 11/29/2014 02:42 AM    MCH 24.3* 01/03/2015 02:36 AM    MCH 24.0* 01/01/2015 05:04 PM     MCH 22.5* 11/29/2014 02:42 AM    MCHC 29.7* 01/03/2015 02:36 AM    MCHC 29.0* 01/01/2015 05:04 PM    MCHC 28.5* 11/29/2014 02:42 AM    RDW 17.1* 04/28/2011 09:01 PM    PLT 167 01/03/2015 02:36 AM    PLT 244 01/01/2015 05:04 PM    PLT 279 11/29/2014 02:42 AM     Lab Results   Component Value Date/Time    GRANS 48.4 01/03/2015 02:36 AM    GRANS 73.3* 01/01/2015 05:04 PM    GRANS 56.4 11/29/2014 02:42 AM    MONOS 10.7 01/03/2015 02:36 AM    MONOS 3.8 01/01/2015 05:04 PM    MONOS 11.7 11/29/2014 02:42 AM    EOS 2.9 01/03/2015 02:36 AM    EOS 0.8 01/01/2015 05:04 PM    EOS 5.9* 11/29/2014 02:42 AM    BASOS 0.7 01/03/2015 02:36 AM    BASOS 0.5 01/01/2015 05:04 PM    BASOS 0.9 11/29/2014 02:42 AM        Hepatic Function   Recent Labs      01/03/15   0236  01/01/15   1704   ALT  13  20   SGOT  13*  27   TBILI  0.3  0.3   AP  63  94   ALB  2.5*  3.5   TP  5.3*  7.0        Pancreatic Enzymes   Recent Labs      01/01/15   1704   LPSE  115        Basic Metabolic Profile   Recent Labs      01/03/15   0236  01/01/15   1704   NA  145  144   K  3.5  3.4*   CL  112*  111*   CO2  26  28   BUN  3*  9   GLU  108*  81   CREA  0.5*  0.6  CA  7.9*  8.8   MG  1.5*   --         POC Glucose  No components found for: [facility]    Coagulation   Recent Labs      01/03/15   0236  01/01/15   1704   PTP  15.0*  18.1*   INR  1.2*  1.6*          Stool FOB Lab Results   Component Value Date/Time    OCCULT BLOOD, STOOL positive 01/01/2015 04:34 PM       Microbiology Results  No results for input(s): CULT in the last 72 hours.    Radiology Results  Xr Chest Pa Lat    01/01/2015   History: Chest pain, right-sided chest pain  Comparison: 11/26/2014;   Exam: Frontal and lateral chest.  Results:   Heart normal.  Mediastinal contours are normal.  No consolidation.  No pleural effusions.    Mild apical pleural thickening in the right.  No pneumothorax.  No lung  masses.  No free air is seen under the hemidiaphragms.   Osseous  structures intact. Surgical clips upper abdomen.     01/01/2015   IMPRESSION:  No acute disease.         Carlus Pavlov, NP  Gastroenterology [facility] [phone]  [facility] [phone]

## 2015-01-03 NOTE — Procedures (Signed)
Location: [facility]              Esophagogastroduodenoscopy (EGD) Procedure Note    Impression: s/p B-2 gastric resection  Gastritis in small remnant,  bx of Jejunum r/o Celiac Disease and of stomach    Recommendations:  Nutrition consult for post gastric resection diet  -add Carafate Elixier 2 g bid  ( see full note)    Procedure Date:  Jan 03, 2015  Procedure:  EGD   Indications: weight loss  Scope: Olympus GIF  Endoscopist: Noland FordyceBruce Olamide Lahaie MD   PCP Madelyn BrunnerANIEL A BLUESTEIN, MD  Non-physician Assistants: Muhr RN  Informed Consent:  The risks, benefits and alternatives of the procedure and the sedation options were discussed with the patient. The patient is aware of potential complications including but not limited to bleeding, perforation, aspiration, infection, sedation adverse events, missed diagnosis, missed lesions, and intravenous site complications.  Informed consent is documented in the medical record.    Pre-Procedure Assessment:  A current history and physical is on the chart.  Patient's medication allergies were reviewed.  The patient's tolerance of previous anesthesia was also reviewed.  Immediately prior to administration of medications, the patient was reassessed for adequacy to receive sedatives.  The assessment is as follows:  ASA 2 Mallampatti 2  Sedation:  MAC  The medications were administered incrementally over the course of the procedure to achieve an adequate level of sedation.    Procedure Details:  The patient was monitored continuously with ECG tracing, pulse oximetry, blood pressure monitoring and direct observations. The patient was placed in left lateral decubitus position. The endoscope was inserted into the mouth and advanced under direct vision to proximal jejunum.  A careful inspection was made as the endoscope was withdrawn, including a retroflexed view of the proximal stomach.     Findings and interventions are described below.  Appropriate photo  documentation was obtained. There were no apparent complications.  Estimated Blood Loss:  No clinically significant blood loss  Findings:   See full note B -2 The esophagus /DID NOT: have fluid which was removed with suction.  The esophagus appeared dilated.  .  The LES was snug.  There was no esophagitis, Barrett's esophagus, stricture or varices.  The stomach was normal  Post B-2 exceptwith  Gastritis no, ulcer or mass.   The jejunal folds were grossly normal.    To view endoscopic images in EPIC go to the Chart Review Activity > Media Tab > The document type you're searching for is "endo picture" and should be filed on the day of the procedure.      Noland FordyceBruce Rody Keadle MD  [facility]  [place] Office [phone]  [facility] [phone]

## 2015-01-03 NOTE — Anesthesia Post-Procedure Evaluation (Signed)
Post-Anesthesia Evaluation and Assessment    Patient: [name] CourseVeronica E [name] MRN: [id number]  SSN: [id number]    Date of Birth: 09/03/1950  Age: 64 y.o.  Sex: female       Cardiovascular Function/Vital Signs  Visit Vitals   Item Reading   ??? BP 117/76 mmHg   ??? Pulse 69   ??? Temp 36.8 ??C (98.2 ??F)   ??? Resp 16   ??? Ht 5\' 7"  (1.702 m)   ??? Wt 62.3 kg (137 lb 5.6 oz)   ??? BMI 21.51 kg/m2   ??? SpO2 99%       Patient is status post MAC anesthesia for Procedure(s):  EGD with Bxs.    Nausea/Vomiting: None    Postoperative hydration reviewed and adequate.    Pain:  Pain Scale 1: Visual (01/03/15 0948)  Pain Intensity 1: 0 (01/03/15 0937)   Managed    Neurological Status:   Neuro (WDL): Within Defined Limits (01/03/15 [id number])  Neuro  Neurologic State: Alert (01/03/15 30860736)  Orientation Level: Oriented X4 (01/03/15 0736)  Cognition: Appropriate decision making;Appropriate for age attention/concentration;Appropriate safety awareness (01/03/15 0736)  Speech: Clear (01/03/15 0736)   At baseline    Mental Status and Level of Consciousness: Alert and oriented     Pulmonary Status:   O2 Device: Room air (01/03/15 0936)   Adequate oxygenation and airway patent    Complications related to anesthesia: None    Post-anesthesia assessment completed. No concerns    Signed By: Kalman DrapeOSS Mohd. Derflinger, MD     Jan 03, 2015

## 2015-01-03 NOTE — Anesthesia Pre-Procedure Evaluation (Addendum)
Anesthetic History   No history of anesthetic complications            Review of Systems / Medical History  Patient summary reviewed, nursing notes reviewed and pertinent labs reviewed    Pulmonary          Smoker  Asthma : well controlled    Comments: PHx of smoling   Neuro/Psych     seizures  CVA  TIA and psychiatric history     Cardiovascular              CAD and cardiac stents    Exercise tolerance: <4 METS     GI/Hepatic/Renal           Liver disease     Endo/Other      Hypothyroidism: well controlled  Arthritis  Pertinent negatives: No diabetes   Other Findings            Physical Exam    Airway  Mallampati: II    Neck ROM: normal range of motion   Mouth opening: Normal     Cardiovascular    Rhythm: regular  Rate: normal         Dental    Dentition: Poor dentition     Pulmonary  Breath sounds clear to auscultation               Abdominal  GI exam deferred       Other Findings            Anesthetic Plan    ASA: 3  Anesthesia type: MAC          Induction: Intravenous  Anesthetic plan and risks discussed with: Patient

## 2015-01-03 NOTE — Progress Notes (Signed)
Spoke to Dr. Federico FlakeWaldholtz (GI) okay for cardiac diet.

## 2015-01-03 NOTE — Other (Signed)
TRANSFER - IN REPORT:     Clemencia CourseVeronica E Mandujano  being received from [facility](unit) for ordered procedure      Report consisted of patient???s Situation, Background, Assessment and   Recommendations(SBAR).     Information from the following report(s) Kardex, MAR and Recent Results was reviewed with the receiving nurse.    Opportunity for questions and clarification was provided.      Assessment completed upon patient???s arrival to unit and care assumed.

## 2015-01-03 NOTE — Progress Notes (Signed)
Date of Surgery Update:  [name] CourseVeronica E [name] was seen and examined.  History and physical has been reviewed. The patient has been examined. There have been no significant clinical changes since the completion of the originally dated History and Physical.    Signed By: Lilia ProBRUCE D Kerisha Goughnour, MD     Jan 03, 2015 9:20 AM

## 2015-01-03 NOTE — Progress Notes (Signed)
Medical Progress Note      NAME: [name] [name]   DOB:  03-09-1951  MRN:             [id number]    Date/Time: 01/03/2015  11:29 AM      Assessment:     1. Intractable nausea, vomiting, diarrhea, resolved  2. Suspected gastrointestinal bleeding. S/p EGD today  3. Gastritis on EGD  4. Chronic iron deficiency anemia.   5. History of PE and DVT, on Coumadin.   6. History of gastric ulcer, status post partial gastrectomy.  7. CAD, status post stents.  8. Hypertension, uncontrolled.  9. Chronic pain with fibromyalgia.  10. Anxiety and depression, crying this morning due to her family situation    Plan:     ?? S/p EGD this morning that showed gastritis.   ?? Continue PPI  ?? Advance diet as tolerated  ?? Will continue ativan, add SSRI for depression.  ?? Tele psych consult.  ?? Resume coumadin   Subjective:     Depressed, anxious, crying, has family problems she is worried about    Objective:     Vitals:      Last 24hrs VS reviewed since prior progress note. Most recent are:  Visit Vitals   Item Reading   ??? BP 117/60 mmHg   ??? Pulse 69   ??? Temp 98 ??F (36.7 ??C)   ??? Resp 16   ??? Ht $Remo'5\' 7"'etETB$  (1.702 m)   ??? Wt 62.3 kg (137 lb 5.6 oz)   ??? BMI 21.51 kg/m2   ??? SpO2 100%     SpO2 Readings from Last 6 Encounters:   01/03/15 100%   12/22/14 100%   11/29/14 100%   06/15/13 100%   03/29/12 98%   01/20/12 100%            Intake/Output Summary (Last 24 hours) at 01/03/15 1129  Last data filed at 01/03/15 6160   Gross per 24 hour   Intake 2556.67 ml   Output    450 ml   Net 2106.67 ml        Exam:     General:   Not in acute distress  HEENT: PERRLA, Neck Supple,  No JVD  Respiratory:   CTA bilaterally-no wheezes, rales, rhonchi, or crackles  Cardiac:  Regular Rate and Rythmn  - no murmurs, rubs or gallops  Abdominal:  Soft, non-tender, non-distended, positive bowel sounds  Extremities:  No cyanosis, or edema.  Skin: No rash  Neurological:  No focal neurological deficits    Medication:     Current Medications Reviewed     Current facility-administered medications:   ???  0.9% sodium chloride infusion, 30 mL/hr, IntraVENous, CONTINUOUS, Sandy Salaam, MD, Last Rate: 30 mL/hr at 01/03/15 0845, 30 mL/hr at 01/03/15 0845  ???  sucralfate (CARAFATE) 100 mg/mL oral suspension 2 g, 2 g, Oral, ACB&D, Charmaine Downs, NP, 2 g at 01/03/15 1036  ???  pantoprazole (PROTONIX) 40 mg in sodium chloride 0.9 % 10 mL injection, 40 mg, IntraVENous, Q12H, Amy L Wright, NP, 40 mg at 01/03/15 1036  ???  dextrose 5% and 0.9% NaCl infusion, 100 mL/hr, IntraVENous, CONTINUOUS, Stanton Kidney, MD, Last Rate: 100 mL/hr at 01/03/15 0704, 100 mL/hr at 01/03/15 0704  ???  morphine injection 2-4 mg, 2-4 mg, IntraVENous, Q4H PRN, Stanton Kidney, MD, 2 mg at 01/03/15 0658  ???  LORazepam (ATIVAN) injection 0.5 mg, 0.5 mg, IntraVENous, Q8H PRN, Stanton Kidney, MD, 0.5  mg at 01/03/15 1036  ???  ondansetron (ZOFRAN) injection 4 mg, 4 mg, IntraVENous, Q4H PRN, Lennox Laity, MD, 4 mg at 01/02/15 8527  ???  albuterol (PROVENTIL HFA, VENTOLIN HFA, PROAIR HFA) inhaler 1 Puff, 1 Puff, Inhalation, Q4H PRN, Stanton Kidney, MD  ???  albuterol-ipratropium (DUO-NEB) 2.5 MG-0.5 MG/3 ML, 3 mL, Nebulization, Q6H RT, Stanton Kidney, MD, Stopped at 01/03/15 0800  ???  hydroxychloroquine (PLAQUENIL) tablet 200 mg, 200 mg, Oral, BID, Stanton Kidney, MD, 200 mg at 01/03/15 1036  ???  naloxone (NARCAN) injection 0.1 mg, 0.1 mg, IntraVENous, PRN, Stanton Kidney, MD  ???  glucagon (GLUCAGEN) injection 1 mg, 1 mg, IntraMUSCular, PRN, Stanton Kidney, MD  ???  dextrose (D50W) injection syrg 10-15 g, 20-30 mL, IntraVENous, PRN, Stanton Kidney, MD, 10 g at 01/01/15 2106    Lab:     Lab Data Reviewed: (see below)  Recent Results (from the past 24 hour(s))   GLUCOSE, POC    Collection Time: 01/02/15 12:23 PM   Result Value Ref Range    Glucose (POC) 96 65 - 105 mg/dL   GLUCOSE, POC    Collection Time: 01/02/15  5:53 PM   Result Value Ref Range    Glucose (POC) 119 (H) 65 - 105 mg/dL   HGB & HCT     Collection Time: 01/02/15  9:55 PM   Result Value Ref Range    HGB 8.9 (L) 13.0 - 17.2 gm/dl    HCT 31.1 (L) 37.0 - 50.0 %   GLUCOSE, POC    Collection Time: 01/03/15 12:06 AM   Result Value Ref Range    Glucose (POC) 132 (H) 65 - 105 mg/dL   CBC WITH AUTOMATED DIFF    Collection Time: 01/03/15  2:36 AM   Result Value Ref Range    WBC 4.2 4.0 - 11.0 1000/mm3    RBC 3.46 (L) 3.60 - 5.20 M/uL    HGB 8.4 (L) 13.0 - 17.2 gm/dl    HCT 28.3 (L) 37.0 - 50.0 %    MCV 81.8 80.0 - 98.0 fL    MCH 24.3 (L) 25.4 - 34.6 pg    MCHC 29.7 (L) 30.0 - 36.0 gm/dl    PLATELET 167 140 - 450 1000/mm3    MPV 10.6 (H) 6.0 - 10.0 fL    RDW-SD 80.1 (HH) 36.4 - 46.3      NRBC 0 0 - 0      IMMATURE GRANULOCYTES 0.2 0.0 - 3.0 %    NEUTROPHILS 48.4 34 - 64 %    LYMPHOCYTES 37.1 28 - 48 %    MONOCYTES 10.7 1 - 13 %    EOSINOPHILS 2.9 0 - 5 %    BASOPHILS 0.7 0 - 3 %    Poikilocytosis 1+      ANISOCYTOSIS 1+      Macrocytes 1+      Codocytes OCCASIONAL      Schistocytes OCCASIONAL      Dacrocytes OCCASIONAL      Crenated RBCs OCCASIONAL      PLATELET COMMENTS NORMAL      Giant platelets FEW     METABOLIC PANEL, COMPREHENSIVE    Collection Time: 01/03/15  2:36 AM   Result Value Ref Range    Sodium 145 136 - 145 mEq/L    Potassium 3.5 3.5 - 5.1 mEq/L    Chloride 112 (H) 98 - 107 mEq/L    CO2 26 21 - 32  mEq/L    Glucose 108 (H) 74 - 106 mg/dl    BUN 3 (L) 7 - 25 mg/dl    Creatinine 0.5 (L) 0.6 - 1.3 mg/dl    GFR est AA >60.0      GFR est non-AA >60      Calcium 7.9 (L) 8.5 - 10.1 mg/dl    AST 13 (L) 15 - 37 U/L    ALT 13 12 - 78 U/L    Alk. phosphatase 63 45 - 117 U/L    Bilirubin, total 0.3 0.2 - 1.0 mg/dl    Protein, total 5.3 (L) 6.4 - 8.2 gm/dl    Albumin 2.5 (L) 3.4 - 5.0 gm/dl   MAGNESIUM    Collection Time: 01/03/15  2:36 AM   Result Value Ref Range    Magnesium 1.5 (L) 1.8 - 2.4 mg/dl   PROTHROMBIN TIME + INR    Collection Time: 01/03/15  2:36 AM   Result Value Ref Range    Prothrombin time 15.0 (H) 11.5 - 14.0 seconds     INR 1.2 (H) 0.0 - 1.1     GLUCOSE, POC    Collection Time: 01/03/15  6:52 AM   Result Value Ref Range    Glucose (POC) 99 65 - 105 mg/dL       Xr Chest Pa Lat    01/01/2015   History: Chest pain, right-sided chest pain  Comparison: 11/26/2014;   Exam: Frontal and lateral chest.  Results:   Heart normal.  Mediastinal contours are normal.  No consolidation.  No pleural effusions.    Mild apical pleural thickening in the right.  No pneumothorax.  No lung  masses.  No free air is seen under the hemidiaphragms.   Osseous structures intact. Surgical clips upper abdomen.     01/01/2015   IMPRESSION:  No acute disease.       Active Problems:    Gastrointestinal hemorrhage (01/01/2015)        Prophylaxis:  Lovenox  Coumadin  Hep SQ  SCD???s  H2B/PPI    Disposition:  Home w/ Family   HH PT,OT,RN   SNF/LTC   SAH/Rehab    Care Plan discussed with:    Patient   Family    Care Manager  ED Doc   Specialist :       _______________________________________________________________________        Attending Physician: Stanton Kidney, MD      [facility]

## 2015-01-03 NOTE — Other (Signed)
TRANSFER - OUT REPORT:    Verbal report given to Dorcas McmurrayBrown-Appiah, Courtney, RN(name) on Clemencia CourseVeronica E Tomer  being transferred to [facility](unit) for routine progression of care       Report consisted of patient???s Situation, Background, Assessment and   Recommendations(SBAR).     Information from the following report(s) SBAR, OR Summary, Procedure Summary, Intake/Output and Med Rec Status was reviewed with the receiving nurse.    Opportunity for questions and clarification was provided.      Patient transported with:   [employer]

## 2015-01-03 NOTE — Other (Signed)
Bedside and Verbal shift change report given to Guss BundeMercedes Leon RN (oncoming nurse) by Dorcas Mcmurrayourtney Brown-Appiah RN (offgoing nurse). Report included the following information SBAR, Accordion, Recent Results and Cardiac Rhythm NSR.

## 2015-01-04 LAB — METABOLIC PANEL, COMPREHENSIVE
ALT (SGPT): 12 U/L (ref 12–78)
AST (SGOT): 15 U/L (ref 15–37)
Albumin: 2.7 gm/dl — ABNORMAL LOW (ref 3.4–5.0)
Alk. phosphatase: 69 U/L (ref 45–117)
BUN: 3 mg/dl — ABNORMAL LOW (ref 7–25)
Bilirubin, total: 0.3 mg/dl (ref 0.2–1.0)
CO2: 28 mEq/L (ref 21–32)
Calcium: 7.7 mg/dl — ABNORMAL LOW (ref 8.5–10.1)
Chloride: 110 mEq/L — ABNORMAL HIGH (ref 98–107)
Creatinine: 0.5 mg/dl — ABNORMAL LOW (ref 0.6–1.3)
GFR est AA: >60
GFR est non-AA: >60
Glucose: 92 mg/dl (ref 74–106)
Potassium: 3.4 mEq/L — ABNORMAL LOW (ref 3.5–5.1)
Protein, total: 5.5 gm/dl — ABNORMAL LOW (ref 6.4–8.2)
Sodium: 145 mEq/L (ref 136–145)

## 2015-01-04 LAB — HGB & HCT
HCT: 32.6 % — ABNORMAL LOW (ref 37.0–50.0)
HCT: 30.7 % — ABNORMAL LOW (ref 37.0–50.0)
HGB: 9.4 gm/dl — ABNORMAL LOW (ref 13.0–17.2)
HGB: 8.8 gm/dl — ABNORMAL LOW (ref 13.0–17.2)

## 2015-01-04 LAB — CBC WITH AUTOMATED DIFF
BASOPHILS: 0.7 % (ref 0–3)
EOSINOPHILS: 4.4 % (ref 0–5)
HCT: 29.5 % — ABNORMAL LOW (ref 37.0–50.0)
HGB: 9.1 gm/dl — ABNORMAL LOW (ref 13.0–17.2)
IMMATURE GRANULOCYTES: 0.2 % (ref 0.0–3.0)
LYMPHOCYTES: 31.1 % (ref 28–48)
MCH: 24.9 pg — ABNORMAL LOW (ref 25.4–34.6)
MCHC: 30.8 gm/dl (ref 30.0–36.0)
MCV: 80.8 fL (ref 80.0–98.0)
MONOCYTES: 8.4 % (ref 1–13)
MPV: 11.5 fL — ABNORMAL HIGH (ref 6.0–10.0)
NEUTROPHILS: 55.2 % (ref 34–64)
NRBC: 0 (ref 0–0)
PLATELET: 173 10*3/uL (ref 140–450)
RBC: 3.65 M/uL (ref 3.60–5.20)
RDW-SD: 78.6 — ABNORMAL HIGH (ref 36.4–46.3)
WBC: 4.5 10*3/uL (ref 4.0–11.0)

## 2015-01-04 LAB — GLUCOSE, POC
Glucose (POC): 92 mg/dL (ref 65–105)
Glucose (POC): 100 mg/dL (ref 65–105)
Glucose (POC): 97 mg/dL (ref 65–105)

## 2015-01-04 LAB — MAGNESIUM: Magnesium: 1.7 mg/dl — ABNORMAL LOW (ref 1.8–2.4)

## 2015-01-04 MED ORDER — IPRATROPIUM-ALBUTEROL 2.5 MG-0.5 MG/3 ML NEB SOLUTION
2.5 mg-0.5 mg/3 ml | Freq: Four times a day (QID) | RESPIRATORY_TRACT | Status: DC | PRN
Start: 2015-01-04 — End: 2015-01-07

## 2015-01-04 MED ORDER — SODIUM CHLORIDE 0.9 % IJ SYRG
INTRAMUSCULAR | Status: AC
Start: 2015-01-04 — End: 2015-01-03
  Administered 2015-01-04: 02:00:00

## 2015-01-04 MED FILL — MORPHINE 2 MG/ML INJECTION: 2 mg/mL | INTRAMUSCULAR | Qty: 2

## 2015-01-04 MED FILL — IPRATROPIUM-ALBUTEROL 2.5 MG-0.5 MG/3 ML NEB SOLUTION: 2.5 mg-0.5 mg/3 ml | RESPIRATORY_TRACT | Qty: 3

## 2015-01-04 MED FILL — MORPHINE 2 MG/ML INJECTION: 2 mg/mL | INTRAMUSCULAR | Qty: 1

## 2015-01-04 MED FILL — SODIUM CHLORIDE 0.9 % INJECTION: INTRAMUSCULAR | Qty: 10

## 2015-01-04 MED FILL — HYDROXYCHLOROQUINE 200 MG TAB: 200 mg | ORAL | Qty: 1

## 2015-01-04 MED FILL — LORAZEPAM 2 MG/ML IJ SOLN: 2 mg/mL | INTRAMUSCULAR | Qty: 1

## 2015-01-04 MED FILL — BD POSIFLUSH NORMAL SALINE 0.9 % INJECTION SYRINGE: INTRAMUSCULAR | Qty: 20

## 2015-01-04 MED FILL — SUCRALFATE 100 MG/ML ORAL SUSP: 100 mg/mL | ORAL | Qty: 20

## 2015-01-04 MED FILL — FLUOXETINE 20 MG CAP: 20 mg | ORAL | Qty: 1

## 2015-01-04 NOTE — Other (Signed)
Bedside shift change report given to [facility]ITTANY (oncoming nurse) by Carolin SicksMERCESED (offgoing nurse). Report included the following information SBAR, Kardex, Recent Results and Cardiac Rhythm SR.

## 2015-01-04 NOTE — Progress Notes (Signed)
Pt states she called to go the bathroom but no one came so she had to go so bad she got up and went to the bathroom. She called when done from bathroom and Gilda CP came and got pt out of bathroom which is when she said she hit her head on the wall trying to get up. I assessed pt's head and there is no lump or redness. She c/o pain in her head and back prior to this. Pain medication given. Pt tearful stating she is sorry and not trying to be this way and doesn't want us to be mad at her. Reassured pt not to be sorry that we were here for her safety and are not mad at her. Bed alarm set and explained pt sorry her call was not answered faster that we would be sure to come sooner next time.

## 2015-01-04 NOTE — Other (Signed)
Bedside and Verbal shift change report given to Lex[employer] InternationalMercedes (Cabin crewoncoming nurse) by [place]ada[place]ittany (offgoing nurse). Report included the following information SBAR and Intake/Output.

## 2015-01-04 NOTE — Progress Notes (Signed)
Medical Progress Note      NAME: [name] [name]   DOB:  1950/12/24  MRN:             [id number]    Date/Time: 01/04/2015  1:15 PM      Assessment:     1. Intractable nausea, vomiting, diarrhea,   2. Gastritis on EGD  3. Chronic iron deficiency anemia.   4. History of PE and DVT, on Coumadin.   5. History of gastric ulcer, status post partial gastrectomy.  6. CAD, status post stents.  7. Hypertension, uncontrolled.  8. Chronic pain with fibromyalgia.  9. Anxiety and depression,     Plan:     ?? Patient is not eating much. Decreased appetite.  ?? She is depressed with family situations  ?? Tele psych talked to her yesterday.  ?? I started her on prozac. Will increase dose in 2-3 days  ?? Continue PPI  ?? Advance diet as tolerated  ?? Will continue ativan,      Subjective:     Looks depressed, anxious, crying, nausea, no abdominal pain   Objective:     Vitals:      Last 24hrs VS reviewed since prior progress note. Most recent are:  Visit Vitals   Item Reading   ??? BP 117/60 mmHg   ??? Pulse 89   ??? Temp 98.4 ??F (36.9 ??C)   ??? Resp 20   ??? Ht $Remo'5\' 7"'RHqrO$  (1.702 m)   ??? Wt 62.3 kg (137 lb 5.6 oz)   ??? BMI 21.51 kg/m2   ??? SpO2 100%     SpO2 Readings from Last 6 Encounters:   01/04/15 100%   12/22/14 100%   11/29/14 100%   06/15/13 100%   03/29/12 98%   01/20/12 100%            Intake/Output Summary (Last 24 hours) at 01/04/15 1315  Last data filed at 01/04/15 1201   Gross per 24 hour   Intake 2596.25 ml   Output   2700 ml   Net -103.75 ml        Exam:     General:   Not in acute distress  HEENT: PERRLA, Neck Supple,  No JVD  Respiratory:   CTA bilaterally-no wheezes, rales, rhonchi, or crackles  Cardiac:  Regular Rate and Rythmn  - no murmurs, rubs or gallops  Abdominal:  Soft, non-tender, non-distended, positive bowel sounds  Extremities:  No cyanosis, or edema.  Skin: No rash  Neurological:  No focal neurological deficits    Medication:     Current Medications Reviewed    Current facility-administered medications:    ???  sucralfate (CARAFATE) 100 mg/mL oral suspension 2 g, 2 g, Oral, ACB&D, Charmaine Downs, NP, 2 g at 01/04/15 4332  ???  FLUoxetine (PROzac) capsule 20 mg, 20 mg, Oral, DAILY, Stanton Kidney, MD, 20 mg at 01/04/15 9518  ???  pantoprazole (PROTONIX) 40 mg in sodium chloride 0.9 % 10 mL injection, 40 mg, IntraVENous, Q12H, Amy L Wright, NP, 40 mg at 01/04/15 0925  ???  dextrose 5% and 0.9% NaCl infusion, 75 mL/hr, IntraVENous, CONTINUOUS, Stanton Kidney, MD, Last Rate: 75 mL/hr at 01/04/15 1203, 75 mL/hr at 01/04/15 1203  ???  morphine injection 2-4 mg, 2-4 mg, IntraVENous, Q4H PRN, Stanton Kidney, MD, 4 mg at 01/04/15 1014  ???  LORazepam (ATIVAN) injection 0.5 mg, 0.5 mg, IntraVENous, Q8H PRN, Stanton Kidney, MD, 0.5 mg at 01/04/15 1014  ???  ondansetron (ZOFRAN) injection 4 mg, 4 mg, IntraVENous, Q4H PRN, Lennox Laity, MD, 4 mg at 01/02/15 1610  ???  albuterol (PROVENTIL HFA, VENTOLIN HFA, PROAIR HFA) inhaler 1 Puff, 1 Puff, Inhalation, Q4H PRN, Stanton Kidney, MD  ???  albuterol-ipratropium (DUO-NEB) 2.5 MG-0.5 MG/3 ML, 3 mL, Nebulization, Q6H RT, Stanton Kidney, MD, 3 mL at 01/04/15 0758  ???  hydroxychloroquine (PLAQUENIL) tablet 200 mg, 200 mg, Oral, BID, Stanton Kidney, MD, 200 mg at 01/04/15 9604  ???  naloxone (NARCAN) injection 0.1 mg, 0.1 mg, IntraVENous, PRN, Stanton Kidney, MD  ???  glucagon (GLUCAGEN) injection 1 mg, 1 mg, IntraMUSCular, PRN, Stanton Kidney, MD  ???  dextrose (D50W) injection syrg 10-15 g, 20-30 mL, IntraVENous, PRN, Stanton Kidney, MD, 10 g at 01/01/15 2106    Lab:     Lab Data Reviewed: (see below)  Recent Results (from the past 24 hour(s))   GLUCOSE, POC    Collection Time: 01/03/15  6:00 PM   Result Value Ref Range    Glucose (POC) 91 65 - 105 mg/dL   HGB & HCT    Collection Time: 01/03/15  9:16 PM   Result Value Ref Range    HGB 9.4 (L) 13.0 - 17.2 gm/dl    HCT 32.6 (L) 37.0 - 50.0 %   GLUCOSE, POC    Collection Time: 01/04/15 12:48 AM   Result Value Ref Range    Glucose (POC) 92 65 - 105 mg/dL    CBC WITH AUTOMATED DIFF    Collection Time: 01/04/15  4:53 AM   Result Value Ref Range    WBC 4.5 4.0 - 11.0 1000/mm3    RBC 3.65 3.60 - 5.20 M/uL    HGB 9.1 (L) 13.0 - 17.2 gm/dl    HCT 29.5 (L) 37.0 - 50.0 %    MCV 80.8 80.0 - 98.0 fL    MCH 24.9 (L) 25.4 - 34.6 pg    MCHC 30.8 30.0 - 36.0 gm/dl    PLATELET 173 140 - 450 1000/mm3    MPV 11.5 (H) 6.0 - 10.0 fL    RDW-SD 78.6 (H) 36.4 - 46.3      NRBC 0 0 - 0      IMMATURE GRANULOCYTES 0.2 0.0 - 3.0 %    NEUTROPHILS 55.2 34 - 64 %    LYMPHOCYTES 31.1 28 - 48 %    MONOCYTES 8.4 1 - 13 %    EOSINOPHILS 4.4 0 - 5 %    BASOPHILS 0.7 0 - 3 %   METABOLIC PANEL, COMPREHENSIVE    Collection Time: 01/04/15  4:53 AM   Result Value Ref Range    Sodium 145 136 - 145 mEq/L    Potassium 3.4 (L) 3.5 - 5.1 mEq/L    Chloride 110 (H) 98 - 107 mEq/L    CO2 28 21 - 32 mEq/L    Glucose 92 74 - 106 mg/dl    BUN 3 (L) 7 - 25 mg/dl    Creatinine 0.5 (L) 0.6 - 1.3 mg/dl    GFR est AA >60.0      GFR est non-AA >60      Calcium 7.7 (L) 8.5 - 10.1 mg/dl    AST 15 15 - 37 U/L    ALT 12 12 - 78 U/L    Alk. phosphatase 69 45 - 117 U/L    Bilirubin, total 0.3 0.2 - 1.0 mg/dl    Protein, total 5.5 (L)  6.4 - 8.2 gm/dl    Albumin 2.7 (L) 3.4 - 5.0 gm/dl   MAGNESIUM    Collection Time: 01/04/15  4:53 AM   Result Value Ref Range    Magnesium 1.7 (L) 1.8 - 2.4 mg/dl   GLUCOSE, POC    Collection Time: 01/04/15  8:19 AM   Result Value Ref Range    Glucose (POC) 100 65 - 105 mg/dL   HGB & HCT    Collection Time: 01/04/15 11:25 AM   Result Value Ref Range    HGB 8.8 (L) 13.0 - 17.2 gm/dl    HCT 30.7 (L) 37.0 - 50.0 %   GLUCOSE, POC    Collection Time: 01/04/15 12:45 PM   Result Value Ref Range    Glucose (POC) 97 65 - 105 mg/dL       Xr Chest Pa Lat    01/01/2015   History: Chest pain, right-sided chest pain  Comparison: 11/26/2014;   Exam: Frontal and lateral chest.  Results:   Heart normal.  Mediastinal contours are normal.  No consolidation.  No pleural effusions.     Mild apical pleural thickening in the right.  No pneumothorax.  No lung  masses.  No free air is seen under the hemidiaphragms.   Osseous structures intact. Surgical clips upper abdomen.     01/01/2015   IMPRESSION:  No acute disease.       Active Problems:    Gastrointestinal hemorrhage (01/01/2015)        Prophylaxis:  Lovenox  Coumadin  Hep SQ  SCD???s  H2B/PPI    Disposition:  Home w/ Family   HH PT,OT,RN   SNF/LTC   SAH/Rehab    Care Plan discussed with:    Patient   Family    Care Manager  ED Doc   Specialist :       _______________________________________________________________________        Attending Physician: Stanton Kidney, MD      [facility]

## 2015-01-04 NOTE — Progress Notes (Addendum)
GI Progress Note    Admit Date:  01/01/2015     Assessment:     1. N/V - appears to have resolved. ?gastroenteritis. ?? ??  2. 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 Last colo 06/2014 by Dr. Katrinka Blazing w DLDS w findings of tics, IH, otherwise normal.  3. Weight loss - reported 30 lbs in one month  4. On Coumadin - hx cardiac stent    Plan:     ?? CT abd/pelvis - d/t weight loss, N/V  ?? Will need to f/u w Dr. Katrinka Blazing at [facility] for possible Pill Cam.   ?? Await Celiac results    I have seen and examined the patient independently. The case and chart were reviewed, including all pertinent lab and imaging results. I agree with Ms. Haller's assessment and plan.  She has IDA with significant weight loss.  No findings on EGD/Colonoscopy to explain her symptoms.  Bx + H. Pylori, neg for celiac.    Check CT abd/pelvis to r/o occult malignancy.    If negative likely can be d/c'd home and will f/u with Dr. Katrinka Blazing as outp for eventual pill camera study.  Treat for H. Pylori - amox, clarithromycin, PPI x 14d    Dalia Heading, M.D.   [facility]   [facility] Office: [phone]  [place] Office: [phone]       Subjective:     Denies abdominal pain, nausea, or vomiting. Still has no appetite. Denies diarrhea or blood in stool. She is very tearful and states she wants to go home, but she has "flashes" going on in her head that make her dizzy. Tele psych consult initiated by Hospitalist. CT head on 11/26/14 w findings of mild periventricular microvascular ischemia, otherwise unremarkable.    Patient Active Problem List   Diagnosis Code   ??? Rib pain R07.81   ??? Lupus (HCC) M32.9   ??? Osteoarthrosis, unspecified whether generalized or localized, unspecified site M19.90   ??? Neuralgia, neuritis, and radiculitis, unspecified [id number]   ??? Myalgia and myositis, unspecified M79.1, M60.9   ??? Pain in limb M79.609   ??? Pain in joint, multiple sites M25.50    ??? Type II or unspecified type diabetes mellitus without mention of complication, not stated as uncontrolled (HCC) E11.9   ??? 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 Encounter for long-term (current) use of other medications Z79.899   ??? Abdominal pain, generalized R10.84   ??? 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 Back pain, lumbosacral M54.5, M54.89   ??? GI bleed K92.2   ??? Gastrointestinal hemorrhage K92.2        Objective:     BP 117/60 mmHg   Pulse 89   Temp(Src) 98.4 ??F (36.9 ??C)   Resp 20   Ht  (1.702 m)   Wt 62.3 kg (137 lb 5.6 oz)   BMI 21.51 kg/m2   SpO2 100%   Temp (24hrs), Avg:98.3 ??F (36.8 ??C), Min:97.6 ??F (36.4 ??C), Max:98.8 ??F (37.1 ??C)       Physical Exam:  GENERAL: alert, cooperative, mild distress, appears stated age, cachectic, ABDOMEN: soft, non-tender. Bowel sounds normal. No masses,  no organomegaly      Intake/Output Summary (Last 24 hours) at 01/04/15 1425  Last data filed at 01/04/15 1201   Gross per 24 hour   Intake 2356.25 ml   Output   1800 ml   Net 556.25  ml       Current Facility-Administered Medications   Medication Dose Route Frequency Provider Last Rate Last Dose   ??? sucralfate (CARAFATE) 100 mg/mL oral suspension 2 g  2 g Oral ACB&D Jiles CrockerAshley Haller, NP   2 g at 01/04/15 0926   ??? FLUoxetine (PROzac) capsule 20 mg  20 mg Oral DAILY Theodis AguasYared G Terefe, MD   20 mg at 01/04/15 [id number]   ??? pantoprazole (PROTONIX) 40 mg in sodium chloride 0.9 % 10 mL injection  40 mg IntraVENous Q12H Amy L Delford FieldWright, NP   40 mg at 01/04/15 0925   ??? dextrose 5% and 0.9% NaCl infusion  75 mL/hr IntraVENous CONTINUOUS Theodis AguasYared G Terefe, MD 75 mL/hr at 01/04/15 1203 75 mL/hr at 01/04/15 1203   ??? morphine injection 2-4 mg  2-4 mg IntraVENous Q4H PRN Theodis AguasYared G Terefe, MD   4 mg at 01/04/15 1014   ??? LORazepam (ATIVAN) injection 0.5 mg  0.5 mg IntraVENous Q8H PRN Theodis AguasYared G Terefe, MD   0.5 mg at 01/04/15 1014   ??? ondansetron (ZOFRAN) injection 4 mg  4 mg IntraVENous Q4H PRN Smitty CordsErik H Kisa, MD   4 mg at 01/02/15 [id number]    ??? albuterol (PROVENTIL HFA, VENTOLIN HFA, PROAIR HFA) inhaler 1 Puff  1 Puff Inhalation Q4H PRN Theodis AguasYared G Terefe, MD       ??? albuterol-ipratropium (DUO-NEB) 2.5 MG-0.5 MG/3 ML  3 mL Nebulization Q6H RT Theodis AguasYared G Terefe, MD   3 mL at 01/04/15 0758   ??? hydroxychloroquine (PLAQUENIL) tablet 200 mg  200 mg Oral BID Theodis AguasYared G Terefe, MD   200 mg at 01/04/15 0926   ??? naloxone (NARCAN) injection 0.1 mg  0.1 mg IntraVENous PRN Theodis AguasYared G Terefe, MD       ??? glucagon (GLUCAGEN) injection 1 mg  1 mg IntraMUSCular PRN Theodis AguasYared G Terefe, MD       ??? dextrose (D50W) injection syrg 10-15 g  20-30 mL IntraVENous PRN Theodis AguasYared G Terefe, MD   10 g at 01/01/15 2106       CBC w/Diff   Lab Results   Component Value Date/Time    WBC 4.5 01/04/2015 04:53 AM  WBC 4.2 01/03/2015 02:36 AM    WBC 7.4 01/01/2015 05:04 PM    RBC 3.65 01/04/2015 04:53 AM    RBC 3.46* 01/03/2015 02:36 AM    RBC 4.42 01/01/2015 05:04 PM    HGB 8.8* 01/04/2015 11:25 AM    HGB 9.1* 01/04/2015 04:53 AM    HGB 9.4* 01/03/2015 09:16 PM    HCT 30.7* 01/04/2015 11:25 AM    HCT 29.5* 01/04/2015 04:53 AM    HCT 32.6* 01/03/2015 09:16 PM    MCV 80.8 01/04/2015 04:53 AM    MCV 81.8 01/03/2015 02:36 AM    MCV 82.8 01/01/2015 05:04 PM    MCH 24.9* 01/04/2015 04:53 AM    MCH 24.3* 01/03/2015 02:36 AM    MCH 24.0* 01/01/2015 05:04 PM    MCHC 30.8 01/04/2015 04:53 AM    MCHC 29.7* 01/03/2015 02:36 AM    MCHC 29.0* 01/01/2015 05:04 PM    RDW 17.1* 04/28/2011 09:01 PM    PLT 173 01/04/2015 04:53 AM    PLT 167 01/03/2015 02:36 AM    PLT 244 01/01/2015 05:04 PM     Lab Results   Component Value Date/Time    GRANS 55.2 01/04/2015 04:53 AM    GRANS 48.4 01/03/2015 02:36 AM    GRANS 73.3* 01/01/2015 05:04 PM  MONOS 8.4 01/04/2015 04:53 AM    MONOS 10.7 01/03/2015 02:36 AM    MONOS 3.8 01/01/2015 05:04 PM    EOS 4.4 01/04/2015 04:53 AM    EOS 2.9 01/03/2015 02:36 AM    EOS 0.8 01/01/2015 05:04 PM    BASOS 0.7 01/04/2015 04:53 AM    BASOS 0.7 01/03/2015 02:36 AM    BASOS 0.5 01/01/2015 05:04 PM         Hepatic Function   Lab Results Component Value Date/Time    ALT 12 01/04/2015 04:53 AM    ALT 13 01/03/2015 02:36 AM    ALT 20 01/01/2015 05:04 PM    SGOT 15 01/04/2015 04:53 AM    SGOT 13* 01/03/2015 02:36 AM    SGOT 27 01/01/2015 05:04 PM    TBILI 0.3 01/04/2015 04:53 AM    TBILI 0.3 01/03/2015 02:36 AM    TBILI 0.3 01/01/2015 05:04 PM    AP 69 01/04/2015 04:53 AM    AP 63 01/03/2015 02:36 AM    AP 94 01/01/2015 05:04 PM    ALB 2.7* 01/04/2015 04:53 AM    ALB 2.5* 01/03/2015 02:36 AM    ALB 3.5 01/01/2015 05:04 PM    TP 5.5* 01/04/2015 04:53 AM    TP 5.3* 01/03/2015 02:36 AM    TP 7.0 01/01/2015 05:04 PM        Pancreatic Enzymes   Lab Results   Component Value Date/Time    LPSE 115 01/01/2015 05:04 PM        Basic Metabolic Profile   Lab Results   Component Value Date/Time    NA 145 01/04/2015 04:53 AM    NA 145 01/03/2015 02:36 AM    NA 144 01/01/2015 05:04 PM    K 3.4* 01/04/2015 04:53 AM    K 3.5 01/03/2015 02:36 AM    K 3.4* 01/01/2015 05:04 PM    CL 110* 01/04/2015 04:53 AM    CL 112* 01/03/2015 02:36 AM    CL 111* 01/01/2015 05:04 PM    CO2 28 01/04/2015 04:53 AM    CO2 26 01/03/2015 02:36 AM    CO2 28 01/01/2015 05:04 PM    CO2 22 11/26/2014 04:29 PM    CO2 26 09/09/2014 01:04 PM    CO2 25 09/01/2014 04:07 PM    BUN 3* 01/04/2015 04:53 AM    BUN 3* 01/03/2015 02:36 AM    BUN 9 01/01/2015 05:04 PM    GLU 92 01/04/2015 04:53 AM    GLU 108* 01/03/2015 02:36 AM    GLU 81 01/01/2015 05:04 PM  CREA 0.5* 01/04/2015 04:53 AM    CREA 0.5* 01/03/2015 02:36 AM    CREA 0.6 01/01/2015 05:04 PM    CA 7.7* 01/04/2015 04:53 AM    CA 7.9* 01/03/2015 02:36 AM    CA 8.8 01/01/2015 05:04 PM    MG 1.7* 01/04/2015 04:53 AM    MG 1.5* 01/03/2015 02:36 AM    MG 1.8 11/29/2014 02:42 AM    PHOS 3.6 11/29/2014 02:42 AM        Coagulation   Lab Results   Component Value Date/Time    PTP 15.0* 01/03/2015 02:36 AM    PTP 18.1* 01/01/2015 05:04 PM    PTP 19.9* 11/28/2014 05:21 AM    INR 1.2* 01/03/2015 02:36 AM     INR 1.6* 01/01/2015 05:04 PM    INR 1.8* 11/28/2014 05:21 AM        Stool FOB Lab Results  Component Value Date/Time    OCCULT BLOOD, STOOL positive 01/01/2015 04:34 PM       Microbiology Results  No results for input(s): CULT in the last 72 hours.  No results found for: OVA, OVPA       Radiology Results  Xr Chest Pa Lat    01/01/2015   History: Chest pain, right-sided chest pain  Comparison: 11/26/2014;   Exam: Frontal and lateral chest.  Results:   Heart normal.  Mediastinal contours are normal.  No consolidation.  No pleural effusions.    Mild apical pleural thickening in the right.  No pneumothorax.  No lung  masses.  No free air is seen under the hemidiaphragms.   Osseous structures intact. Surgical clips upper abdomen.     01/01/2015   IMPRESSION:  No acute disease.          Carlus Pavlov, NP  [facility] [phone]  [facility] [phone]

## 2015-01-05 ENCOUNTER — Inpatient Hospital Stay: Admit: 2015-01-05 | Payer: MEDICAID | Primary: Internal Medicine

## 2015-01-05 LAB — METABOLIC PANEL, COMPREHENSIVE
ALT (SGPT): 11 U/L — ABNORMAL LOW (ref 12–78)
AST (SGOT): 12 U/L — ABNORMAL LOW (ref 15–37)
Albumin: 2.5 gm/dl — ABNORMAL LOW (ref 3.4–5.0)
Alk. phosphatase: 63 U/L (ref 45–117)
BUN: 3 mg/dl — ABNORMAL LOW (ref 7–25)
Bilirubin, total: 0.4 mg/dl (ref 0.2–1.0)
CO2: 27 mEq/L (ref 21–32)
Calcium: 7.8 mg/dl — ABNORMAL LOW (ref 8.5–10.1)
Chloride: 111 mEq/L — ABNORMAL HIGH (ref 98–107)
Creatinine: 0.5 mg/dl — ABNORMAL LOW (ref 0.6–1.3)
GFR est AA: >60
GFR est non-AA: >60
Glucose: 88 mg/dl (ref 74–106)
Potassium: 3.5 mEq/L (ref 3.5–5.1)
Protein, total: 5.4 gm/dl — ABNORMAL LOW (ref 6.4–8.2)
Sodium: 145 mEq/L (ref 136–145)

## 2015-01-05 LAB — HGB & HCT
HCT: 28.9 % — ABNORMAL LOW (ref 37.0–50.0)
HCT: 33.2 % — ABNORMAL LOW (ref 37.0–50.0)
HGB: 8.7 gm/dl — ABNORMAL LOW (ref 13.0–17.2)
HGB: 9.8 gm/dl — ABNORMAL LOW (ref 13.0–17.2)

## 2015-01-05 LAB — CBC WITH AUTOMATED DIFF
BASOPHILS: 0.9 % (ref 0–3)
EOSINOPHILS: 5.3 % — ABNORMAL HIGH (ref 0–5)
HCT: 28.6 % — ABNORMAL LOW (ref 37.0–50.0)
HGB: 8.8 gm/dl — ABNORMAL LOW (ref 13.0–17.2)
IMMATURE GRANULOCYTES: 0 % (ref 0.0–3.0)
LYMPHOCYTES: 31.9 % (ref 28–48)
MCH: 24.7 pg — ABNORMAL LOW (ref 25.4–34.6)
MCHC: 30.8 gm/dl (ref 30.0–36.0)
MCV: 80.3 fL (ref 80.0–98.0)
MONOCYTES: 7.3 % (ref 1–13)
MPV: 10.7 fL — ABNORMAL HIGH (ref 6.0–10.0)
NEUTROPHILS: 54.6 % (ref 34–64)
NRBC: 0 (ref 0–0)
PLATELET COMMENTS: NORMAL
PLATELET: 169 10*3/uL (ref 140–450)
RBC: 3.56 M/uL — ABNORMAL LOW (ref 3.60–5.20)
RDW-SD: 77.4 — ABNORMAL HIGH (ref 36.4–46.3)
WBC: 4.5 10*3/uL (ref 4.0–11.0)

## 2015-01-05 LAB — GLUCOSE, POC
Glucose (POC): 95 mg/dL (ref 65–105)
Glucose (POC): 91 mg/dL (ref 65–105)
Glucose (POC): 78 mg/dL (ref 65–105)
Glucose (POC): 103 mg/dL (ref 65–105)
Glucose (POC): 86 mg/dL (ref 65–105)

## 2015-01-05 LAB — MAGNESIUM: Magnesium: 1.8 mg/dl (ref 1.8–2.4)

## 2015-01-05 MED ORDER — POLYETHYLENE GLYCOL 3350 17 GRAM (100 %) ORAL POWDER PACKET
17 gram | Freq: Every day | ORAL | Status: DC
Start: 2015-01-05 — End: 2015-01-07
  Administered 2015-01-05 – 2015-01-07 (×3): via ORAL

## 2015-01-05 MED ORDER — IOPAMIDOL 76 % IV SOLN
370 mg iodine /mL (76 %) | Freq: Once | INTRAVENOUS | Status: AC
Start: 2015-01-05 — End: 2015-01-05
  Administered 2015-01-05: 13:00:00 via INTRAVENOUS

## 2015-01-05 MED ORDER — AMOXICILLIN 500 MG CAP
500 mg | Freq: Two times a day (BID) | ORAL | Status: DC
Start: 2015-01-05 — End: 2015-01-07
  Administered 2015-01-05 – 2015-01-07 (×6): via ORAL

## 2015-01-05 MED ORDER — SODIUM CHLORIDE 0.9 % IJ SYRG
Freq: Once | INTRAMUSCULAR | Status: AC
Start: 2015-01-05 — End: 2015-01-05
  Administered 2015-01-05: 13:00:00 via INTRAVENOUS

## 2015-01-05 MED ORDER — CLARITHROMYCIN 500 MG TAB
500 mg | Freq: Two times a day (BID) | ORAL | Status: DC
Start: 2015-01-05 — End: 2015-01-07
  Administered 2015-01-05 – 2015-01-07 (×6): via ORAL

## 2015-01-05 MED FILL — AMOXICILLIN 500 MG CAP: 500 mg | ORAL | Qty: 2

## 2015-01-05 MED FILL — MORPHINE 2 MG/ML INJECTION: 2 mg/mL | INTRAMUSCULAR | Qty: 2

## 2015-01-05 MED FILL — HYDROXYCHLOROQUINE 200 MG TAB: 200 mg | ORAL | Qty: 1

## 2015-01-05 MED FILL — SUCRALFATE 100 MG/ML ORAL SUSP: 100 mg/mL | ORAL | Qty: 20

## 2015-01-05 MED FILL — SODIUM CHLORIDE 0.9 % INJECTION: INTRAMUSCULAR | Qty: 10

## 2015-01-05 MED FILL — CLARITHROMYCIN 500 MG TAB: 500 mg | ORAL | Qty: 1

## 2015-01-05 MED FILL — POLYETHYLENE GLYCOL 3350 17 GRAM (100 %) ORAL POWDER PACKET: 17 gram | ORAL | Qty: 1

## 2015-01-05 MED FILL — LORAZEPAM 2 MG/ML IJ SOLN: 2 mg/mL | INTRAMUSCULAR | Qty: 1

## 2015-01-05 MED FILL — BD POSIFLUSH NORMAL SALINE 0.9 % INJECTION SYRINGE: INTRAMUSCULAR | Qty: 10

## 2015-01-05 MED FILL — ISOVUE-370  76 % INTRAVENOUS SOLUTION: 370 mg iodine /mL (76 %) | INTRAVENOUS | Qty: 80

## 2015-01-05 MED FILL — MORPHINE 2 MG/ML INJECTION: 2 mg/mL | INTRAMUSCULAR | Qty: 1

## 2015-01-05 MED FILL — FLUOXETINE 20 MG CAP: 20 mg | ORAL | Qty: 1

## 2015-01-05 NOTE — Other (Signed)
Bedside and Verbal shift change report given to Dorcas Mcmurrayourtney Brown-APpiah (oncoming nurse) by Guss BundeMercedes Leon (offgoing nurse). Report included the following information SBAR, MAR, Accordion, Recent Results and Cardiac Rhythm NSR tele box #[id number].

## 2015-01-05 NOTE — Progress Notes (Signed)
Current [facility]  Patient will need resume HH orders.  Raynelle FanningJulie with [facility] notified or probable weekend discharge.

## 2015-01-05 NOTE — Progress Notes (Signed)
Medical Progress Note      NAME: [name] [name]   DOB:  Jan 11, 1951  MRN:             [id number]    Date/Time: 01/05/2015  11:11 AM      Assessment:     1. Intractable nausea, vomiting, diarrhea, improving  2. Gastritis on EGD  3. H. Pylori Infection  4. Chronic iron deficiency anemia.   5. History of PE and DVT, on Coumadin.   6. History of gastric ulcer, status post partial gastrectomy.  7. CAD, status post stents.  8. Hypertension, uncontrolled.  9. Chronic pain with fibromyalgia.  10. Anxiety and depression,     Plan:     ?? Continue treatment for H. Pylori  ?? Advance diet as tolerated  ?? Continue PPI  ?? CT abdomen is done, result pending  ?? Will continue ativan and SSRI  ?? May d/c home by tomorrow      Subjective:     Nausea, no vomiting, denies abdominal pain      Objective:     Vitals:      Last 24hrs VS reviewed since prior progress note. Most recent are:  Visit Vitals   Item Reading   ??? BP 134/78 mmHg   ??? Pulse 73   ??? Temp 98.3 ??F (36.8 ??C)   ??? Resp 20   ??? Ht 5\' 7"  (1.702 m)   ??? Wt 57.3 kg (126 lb 5.2 oz)   ??? BMI 19.78 kg/m2   ??? SpO2 100%     SpO2 Readings from Last 6 Encounters: 01/05/15 100%   12/22/14 100%   11/29/14 100%   06/15/13 100%   03/29/12 98%   01/20/12 100%            Intake/Output Summary (Last 24 hours) at 01/05/15 1110  Last data filed at 01/05/15 03/07/15   Gross per 24 hour   Intake 2167.5 ml   Output   1700 ml   Net  467.5 ml        Exam:     General:   Not in acute distress  HEENT: PERRLA, Neck Supple,  No JVD  Respiratory:   CTA bilaterally-no wheezes, rales, rhonchi, or crackles  Cardiac:  Regular Rate and Rythmn  - no murmurs, rubs or gallops  Abdominal:  Soft, non-tender, non-distended, positive bowel sounds  Extremities:  No cyanosis, or edema.  Skin: No rash  Neurological:  No focal neurological deficits    Medication:     Current Medications Reviewed    Current facility-administered medications:   ???  sodium chloride (NS) flush 5-10 mL, 5-10 mL, IntraVENous, RAD ONCE,  3838, MD  ???  polyethylene glycol (MIRALAX) packet 17 g, 17 g, Oral, DAILY, Theodis Aguas, NP, 17 g at 01/05/15 1102  ???  albuterol-ipratropium (DUO-NEB) 2.5 MG-0.5 MG/3 ML, 3 mL, Nebulization, Q6H PRN, 03/07/15, MD  ???  amoxicillin (AMOXIL) capsule 1,000 mg, 1,000 mg, Oral, Q12H, Theodis Aguas, MD, 1,000 mg at 01/05/15 1045  ???  clarithromycin (BIAXIN) tablet 500 mg, 500 mg, Oral, BID, 03/07/15, MD, 500 mg at 01/05/15 1102  ???  sucralfate (CARAFATE) 100 mg/mL oral suspension 2 g, 2 g, Oral, ACB&D, 03/07/15, NP, 2 g at 01/05/15 1045  ???  FLUoxetine (PROzac) capsule 20 mg, 20 mg, Oral, DAILY, 03/07/15, MD, 20 mg at 01/05/15 1045  ???  pantoprazole (PROTONIX) 40 mg in sodium chloride 0.9 % 10  mL injection, 40 mg, IntraVENous, Q12H, Amy L Joya Gaskins, NP, 40 mg at 01/05/15 1045  ???  dextrose 5% and 0.9% NaCl infusion, 75 mL/hr, IntraVENous, CONTINUOUS, Stanton Kidney, MD, Last Rate: 75 mL/hr at 01/04/15 1203, 75 mL/hr at 01/04/15 1203  ???  morphine injection 2-4 mg, 2-4 mg, IntraVENous, Q4H PRN, Stanton Kidney, MD, 4 mg at 01/05/15 1102  ???  LORazepam (ATIVAN) injection 0.5 mg, 0.5 mg, IntraVENous, Q8H PRN, Stanton Kidney, MD, 0.5 mg at 01/04/15 2202  ???  albuterol (PROVENTIL HFA, VENTOLIN HFA, PROAIR HFA) inhaler 1 Puff, 1 Puff, Inhalation, Q4H PRN, Stanton Kidney, MD  ???  hydroxychloroquine (PLAQUENIL) tablet 200 mg, 200 mg, Oral, BID, Stanton Kidney, MD, 200 mg at 01/05/15 1045  ???  naloxone (NARCAN) injection 0.1 mg, 0.1 mg, IntraVENous, PRN, Stanton Kidney, MD  ???  glucagon (GLUCAGEN) injection 1 mg, 1 mg, IntraMUSCular, PRN, Stanton Kidney, MD  ???  dextrose (D50W) injection syrg 10-15 g, 20-30 mL, IntraVENous, PRN, Stanton Kidney, MD, 10 g at 01/01/15 2106    Lab:     Lab Data Reviewed: (see below)  Recent Results (from the past 24 hour(s))   HGB & HCT    Collection Time: 01/04/15 11:25 AM   Result Value Ref Range    HGB 8.8 (L) 13.0 - 17.2 gm/dl    HCT 30.7 (L) 37.0 - 50.0 %   GLUCOSE, POC     Collection Time: 01/04/15 12:45 PM   Result Value Ref Range    Glucose (POC) 97 65 - 105 mg/dL   GLUCOSE, POC    Collection Time: 01/04/15  9:38 PM   Result Value Ref Range    Glucose (POC) 95 65 - 105 mg/dL   HGB & HCT    Collection Time: 01/04/15  9:59 PM   Result Value Ref Range    HGB 8.7 (L) 13.0 - 17.2 gm/dl    HCT 28.9 (L) 37.0 - 50.0 %   CBC WITH AUTOMATED DIFF    Collection Time: 01/05/15  3:24 AM   Result Value Ref Range    WBC 4.5 4.0 - 11.0 1000/mm3    RBC 3.56 (L) 3.60 - 5.20 M/uL    HGB 8.8 (L) 13.0 - 17.2 gm/dl    HCT 28.6 (L) 37.0 - 50.0 %    MCV 80.3 80.0 - 98.0 fL    MCH 24.7 (L) 25.4 - 34.6 pg    MCHC 30.8 30.0 - 36.0 gm/dl    PLATELET 169 140 - 450 1000/mm3    MPV 10.7 (H) 6.0 - 10.0 fL    RDW-SD 77.4 (H) 36.4 - 46.3      NRBC 0 0 - 0      IMMATURE GRANULOCYTES 0.0 0.0 - 3.0 %    NEUTROPHILS 54.6 34 - 64 %    LYMPHOCYTES 31.9 28 - 48 %    MONOCYTES 7.3 1 - 13 %    EOSINOPHILS 5.3 (H) 0 - 5 %    BASOPHILS 0.9 0 - 3 %    Poikilocytosis 1+      ANISOCYTOSIS 1+      Codocytes OCCASIONAL      Schistocytes OCCASIONAL      Dacrocytes OCCASIONAL      Elliptocytes OCCASIONAL      PLATELET COMMENTS NORMAL      Giant platelets FEW     METABOLIC PANEL, COMPREHENSIVE    Collection Time: 01/05/15  3:24 AM  Result Value Ref Range    Sodium 145 136 - 145 mEq/L    Potassium 3.5 3.5 - 5.1 mEq/L    Chloride 111 (H) 98 - 107 mEq/L    CO2 27 21 - 32 mEq/L    Glucose 88 74 - 106 mg/dl    BUN 3 (L) 7 - 25 mg/dl    Creatinine 0.5 (L) 0.6 - 1.3 mg/dl    GFR est AA >60.0      GFR est non-AA >60      Calcium 7.8 (L) 8.5 - 10.1 mg/dl    AST 12 (L) 15 - 37 U/L    ALT 11 (L) 12 - 78 U/L    Alk. phosphatase 63 45 - 117 U/L    Bilirubin, total 0.4 0.2 - 1.0 mg/dl    Protein, total 5.4 (L) 6.4 - 8.2 gm/dl    Albumin 2.5 (L) 3.4 - 5.0 gm/dl   MAGNESIUM    Collection Time: 01/05/15  3:24 AM   Result Value Ref Range    Magnesium 1.8 1.8 - 2.4 mg/dl   GLUCOSE, POC    Collection Time: 01/05/15  8:20 AM   Result Value Ref Range     Glucose (POC) 91 65 - 105 mg/dL   GLUCOSE, POC    Collection Time: 01/05/15  9:26 AM   Result Value Ref Range    Glucose (POC) 78 65 - 105 mg/dL   HGB & HCT    Collection Time: 01/05/15 10:13 AM   Result Value Ref Range    HGB 9.8 (L) 13.0 - 17.2 gm/dl    HCT 33.2 (L) 37.0 - 50.0 %       Xr Chest Pa Lat    01/01/2015   History: Chest pain, right-sided chest pain  Comparison: 11/26/2014;   Exam: Frontal and lateral chest.  Results:   Heart normal.  Mediastinal contours are normal.  No consolidation.  No pleural effusions.    Mild apical pleural thickening in the right.  No pneumothorax.  No lung  masses.  No free air is seen under the hemidiaphragms.   Osseous structures intact. Surgical clips upper abdomen.     01/01/2015   IMPRESSION:  No acute disease.       Active Problems:    Gastrointestinal hemorrhage (01/01/2015)        Prophylaxis:  Lovenox  Coumadin  Hep SQ  SCD???s  H2B/PPI    Disposition:  Home w/ Family   HH PT,OT,RN   SNF/LTC   SAH/Rehab    Care Plan discussed with:    Patient   Family    Care Manager  ED Doc   Specialist :       _______________________________________________________________________        Attending Physician: Stanton Kidney, MD      [facility]

## 2015-01-05 NOTE — Progress Notes (Addendum)
GI Progress Note    Admit Date:  01/01/2015     Assessment:     1. H pylori infection - s/p EGD 01/03/15 findings gastritis (hx gastric bypass), +H pylori, neg Celiac?? ??  2. IDA - Last colo 06/2014 by Dr. Katrinka BlazingSmith w DLDS w findings of tics, IH, otherwise normal. Will need Pill Cam done - f/u w DLDS  3. Weight loss - reported 30 lbs in one month. ?etiology. Further workup may be done as outpt - consider SBFT in addition to M2A  4. On Coumadin - hx cardiac stent    Plan:     ?? Cont H pylori tx x 10 days (d/w pt the importance of following abx regimen and f/u w Dr. Katrinka BlazingSmith to confirm eradication)  ?? PPI bid x 14 days  ?? Miralax for constipation  ?? F/u w Dr. Katrinka BlazingSmith w DLDS: [address], [place], [place] [phone]      I have seen and examined the patient independently. The case and chart were reviewed, including all pertinent lab and imaging results. I agree with Ms. Lynelle DoctorWright's assessment and plan.    Will be available as needed.  Please call if questions.      Dalia HeadingPaul Fidela Cieslak, M.D.   [facility]   [facility] [place] Office: [phone]  [place] Office: [phone]     Subjective:     No further N/V. Denies abdominal pain or diarrhea. Constipation continues. CT abd/pelvis today w no acute findings. Will need f/u w DLDS for Pill Cam and poss SBFT.    Patient Active Problem List   Diagnosis Code   ??? Rib pain R07.81   ??? Lupus (HCC) M32.9   ??? Osteoarthrosis, unspecified whether generalized or localized, unspecified site M19.90   ??? Neuralgia, neuritis, and radiculitis, unspecified IMO0002   ??? Myalgia and myositis, unspecified M79.1, M60.9   ??? Pain in limb M79.609   ??? Pain in joint, multiple sites M25.50   ??? Type II or unspecified type diabetes mellitus without mention of complication, not stated as uncontrolled (HCC) E11.9   ??? 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 Encounter for long-term (current) use of other medications Z79.899   ??? Abdominal pain, generalized R10.84   ??? 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 Back pain, lumbosacral M54.5, M54.89   ??? GI bleed K92.2   ??? Gastrointestinal hemorrhage K92.2        Objective:     BP 115/64 mmHg   Pulse 67   Temp(Src) 98.3 ??F (36.8 ??C)   Resp 18   Ht 5\' 7"  (1.702 m)   Wt 57.3 kg (126 lb 5.2 oz)   BMI 19.78 kg/m2   SpO2 100%   Temp (24hrs), Avg:98.4 ??F (36.9 ??C), Min:98.3 ??F (36.8 ??C), Max:98.5 ??F (36.9 ??C)       Physical Exam:  GENERAL: fatigued, cooperative, no distress, appears stated age, ABDOMEN: soft, non-tender. Bowel sounds normal. No masses,  no organomegaly      Intake/Output Summary (Last 24 hours) at 01/05/15 1009  Last data filed at 01/05/15 0704   Gross per 24 hour   Intake 2167.5 ml   Output   1700 ml   Net  467.5 ml       Current Facility-Administered Medications   Medication Dose Route Frequency Provider Last Rate Last Dose   ??? sodium chloride (NS) flush 5-10 mL  5-10 mL IntraVENous RAD ONCE Theodis AguasYared G Terefe, MD       ???  albuterol-ipratropium (DUO-NEB) 2.5 MG-0.5 MG/3 ML  3 mL Nebulization Q6H PRN Theodis Aguas, MD       ??? amoxicillin (AMOXIL) capsule 1,000 mg  1,000 mg Oral Q12H Franky Macho, MD   1,000 mg at 01/04/15 2203   ??? clarithromycin (BIAXIN) tablet 500 mg  500 mg Oral BID Franky Macho, MD   500 mg at 01/04/15 2203   ??? sucralfate (CARAFATE) 100 mg/mL oral suspension 2 g  2 g Oral ACB&D Jiles Crocker, NP   2 g at 01/04/15 1741   ??? FLUoxetine (PROzac) capsule 20 mg  20 mg Oral DAILY Theodis Aguas, MD   20 mg at 01/04/15 1191   ??? pantoprazole (PROTONIX) 40 mg in sodium chloride 0.9 % 10 mL injection  40 mg IntraVENous Q12H Nelta Numbers, NP   40 mg at 01/04/15 2153   ??? dextrose 5% and 0.9% NaCl infusion  75 mL/hr IntraVENous CONTINUOUS Theodis Aguas, MD 75 mL/hr at 01/04/15 1203 75 mL/hr at 01/04/15 1203   ??? morphine injection 2-4 mg  2-4 mg IntraVENous Q4H PRN Theodis Aguas, MD   2 mg at 01/04/15 2153   ??? LORazepam (ATIVAN) injection 0.5 mg  0.5 mg IntraVENous Q8H PRN Theodis Aguas, MD   0.5 mg at 01/04/15 2202    ??? albuterol (PROVENTIL HFA, VENTOLIN HFA, PROAIR HFA) inhaler 1 Puff  1 Puff Inhalation Q4H PRN Theodis Aguas, MD       ??? hydroxychloroquine (PLAQUENIL) tablet 200 mg  200 mg Oral BID Theodis Aguas, MD   200 mg at 01/04/15 2153   ??? naloxone (NARCAN) injection 0.1 mg  0.1 mg IntraVENous PRN Theodis Aguas, MD       ??? glucagon (GLUCAGEN) injection 1 mg  1 mg IntraMUSCular PRN Theodis Aguas, MD       ??? dextrose (D50W) injection syrg 10-15 g  20-30 mL IntraVENous PRN Theodis Aguas, MD   10 g at 01/01/15 2106       CBC w/Diff   Lab Results   Component Value Date/Time    WBC 4.5 01/05/2015 03:24 AM    WBC 4.5 01/04/2015 04:53 AM    WBC 4.2 01/03/2015 02:36 AM    RBC 3.56* 01/05/2015 03:24 AM    RBC 3.65 01/04/2015 04:53 AM    RBC 3.46* 01/03/2015 02:36 AM    HGB 8.8* 01/05/2015 03:24 AM  HGB 8.7* 01/04/2015 09:59 PM    HGB 8.8* 01/04/2015 11:25 AM    HCT 28.6* 01/05/2015 03:24 AM    HCT 28.9* 01/04/2015 09:59 PM    HCT 30.7* 01/04/2015 11:25 AM    MCV 80.3 01/05/2015 03:24 AM    MCV 80.8 01/04/2015 04:53 AM    MCV 81.8 01/03/2015 02:36 AM    MCH 24.7* 01/05/2015 03:24 AM    MCH 24.9* 01/04/2015 04:53 AM    MCH 24.3* 01/03/2015 02:36 AM    MCHC 30.8 01/05/2015 03:24 AM    MCHC 30.8 01/04/2015 04:53 AM    MCHC 29.7* 01/03/2015 02:36 AM    RDW 17.1* 04/28/2011 09:01 PM    PLT 169 01/05/2015 03:24 AM    PLT 173 01/04/2015 04:53 AM    PLT 167 01/03/2015 02:36 AM     Lab Results   Component Value Date/Time    GRANS 54.6 01/05/2015 03:24 AM    GRANS 55.2 01/04/2015 04:53 AM    GRANS 48.4 01/03/2015 02:36 AM    MONOS 7.3  01/05/2015 03:24 AM    MONOS 8.4 01/04/2015 04:53 AM    MONOS 10.7 01/03/2015 02:36 AM    EOS 5.3* 01/05/2015 03:24 AM    EOS 4.4 01/04/2015 04:53 AM    EOS 2.9 01/03/2015 02:36 AM    BASOS 0.9 01/05/2015 03:24 AM    BASOS 0.7 01/04/2015 04:53 AM    BASOS 0.7 01/03/2015 02:36 AM        Hepatic Function   Lab Results   Component Value Date/Time    ALT 11* 01/05/2015 03:24 AM    ALT 12 01/04/2015 04:53 AM     ALT 13 01/03/2015 02:36 AM    SGOT 12* 01/05/2015 03:24 AM    SGOT 15 01/04/2015 04:53 AM    SGOT 13* 01/03/2015 02:36 AM    TBILI 0.4 01/05/2015 03:24 AM    TBILI 0.3 01/04/2015 04:53 AM    TBILI 0.3 01/03/2015 02:36 AM    AP 63 01/05/2015 03:24 AM    AP 69 01/04/2015 04:53 AM    AP 63 01/03/2015 02:36 AM    ALB 2.5* 01/05/2015 03:24 AM    ALB 2.7* 01/04/2015 04:53 AM    ALB 2.5* 01/03/2015 02:36 AM    TP 5.4* 01/05/2015 03:24 AM    TP 5.5* 01/04/2015 04:53 AM    TP 5.3* 01/03/2015 02:36 AM        Pancreatic Enzymes   Lab Results   Component Value Date/Time    LPSE 115 01/01/2015 05:04 PM        Basic Metabolic Profile   Lab Results   Component Value Date/Time    NA 145 01/05/2015 03:24 AM    NA 145 01/04/2015 04:53 AM    NA 145 01/03/2015 02:36 AM    K 3.5 01/05/2015 03:24 AM    K 3.4* 01/04/2015 04:53 AM    K 3.5 01/03/2015 02:36 AM    CL 111* 01/05/2015 03:24 AM    CL 110* 01/04/2015 04:53 AM    CL 112* 01/03/2015 02:36 AM    CO2 27 01/05/2015 03:24 AM    CO2 28 01/04/2015 04:53 AM    CO2 26 01/03/2015 02:36 AM    CO2 22 11/26/2014 04:29 PM    CO2 26 09/09/2014 01:04 PM    CO2 25 09/01/2014 04:07 PM    BUN 3* 01/05/2015 03:24 AM    BUN 3* 01/04/2015 04:53 AM    BUN 3* 01/03/2015 02:36 AM    GLU 88 01/05/2015 03:24 AM    GLU 92 01/04/2015 04:53 AM    GLU 108* 01/03/2015 02:36 AM    CREA 0.5* 01/05/2015 03:24 AM    CREA 0.5* 01/04/2015 04:53 AM    CREA 0.5* 01/03/2015 02:36 AM    CA 7.8* 01/05/2015 03:24 AM    CA 7.7* 01/04/2015 04:53 AM    CA 7.9* 01/03/2015 02:36 AM    MG 1.8 01/05/2015 03:24 AM    MG 1.7* 01/04/2015 04:53 AM    MG 1.5* 01/03/2015 02:36 AM    PHOS 3.6 11/29/2014 02:42 AM        Coagulation   Lab Results   Component Value Date/Time    PTP 15.0* 01/03/2015 02:36 AM    PTP 18.1* 01/01/2015 05:04 PM    PTP 19.9* 11/28/2014 05:21 AM    INR 1.2* 01/03/2015 02:36 AM    INR 1.6* 01/01/2015 05:04 PM    INR 1.8* 11/28/2014 05:21 AM        Stool FOB Lab Results  Component Value Date/Time     OCCULT BLOOD, STOOL positive 01/01/2015 04:34 PM       Microbiology Results  No results for input(s): CULT in the last 72 hours.  No results found for: OVA, OVPA       Radiology Results  Xr Chest Pa Lat    01/01/2015   History: Chest pain, right-sided chest pain  Comparison: 11/26/2014;   Exam: Frontal and lateral chest.  Results:   Heart normal.  Mediastinal contours are normal.  No consolidation.  No pleural effusions.    Mild apical pleural thickening in the right.  No pneumothorax.  No lung  masses.  No free air is seen under the hemidiaphragms.   Osseous structures intact. Surgical clips upper abdomen.     01/01/2015   IMPRESSION:  No acute disease.          Carlus Pavlov, NP  [facility] [phone]  [facility] [phone]

## 2015-01-05 NOTE — Other (Signed)
Bedside and Verbal shift change report given to Dalbert MayotteKathy Payne Rn (oncoming nurse) by Lynnell DikeJennifer Halmi Rn (offgoing nurse). Report included the following information SBAR, Kardex and Cardiac Rhythm NSR.

## 2015-01-06 LAB — METABOLIC PANEL, COMPREHENSIVE
ALT (SGPT): 13 U/L (ref 12–78)
AST (SGOT): 13 U/L — ABNORMAL LOW (ref 15–37)
Albumin: 2.5 gm/dl — ABNORMAL LOW (ref 3.4–5.0)
Alk. phosphatase: 63 U/L (ref 45–117)
BUN: 8 mg/dl (ref 7–25)
Bilirubin, total: 0.4 mg/dl (ref 0.2–1.0)
CO2: 26 mEq/L (ref 21–32)
Calcium: 7.8 mg/dl — ABNORMAL LOW (ref 8.5–10.1)
Chloride: 108 mEq/L — ABNORMAL HIGH (ref 98–107)
Creatinine: 0.6 mg/dl (ref 0.6–1.3)
GFR est AA: >60
GFR est non-AA: >60
Glucose: 98 mg/dl (ref 74–106)
Potassium: 3.7 mEq/L (ref 3.5–5.1)
Protein, total: 5.5 gm/dl — ABNORMAL LOW (ref 6.4–8.2)
Sodium: 143 mEq/L (ref 136–145)

## 2015-01-06 LAB — CBC WITH AUTOMATED DIFF
BASOPHILS: 0.7 % (ref 0–3)
EOSINOPHILS: 3 % (ref 0–5)
HCT: 29 % — ABNORMAL LOW (ref 37.0–50.0)
HGB: 9 gm/dl — ABNORMAL LOW (ref 13.0–17.2)
IMMATURE GRANULOCYTES: 0.2 % (ref 0.0–3.0)
LYMPHOCYTES: 21.9 % — ABNORMAL LOW (ref 28–48)
MCH: 25 pg — ABNORMAL LOW (ref 25.4–34.6)
MCHC: 31 gm/dl (ref 30.0–36.0)
MCV: 80.6 fL (ref 80.0–98.0)
MONOCYTES: 6.1 % (ref 1–13)
MPV: 11.3 fL — ABNORMAL HIGH (ref 6.0–10.0)
NEUTROPHILS: 68.1 % — ABNORMAL HIGH (ref 34–64)
NRBC: 0 (ref 0–0)
PLATELET COMMENTS: NORMAL
PLATELET: 176 10*3/uL (ref 140–450)
RBC: 3.6 M/uL (ref 3.60–5.20)
RDW-SD: 77.1 — ABNORMAL HIGH (ref 36.4–46.3)
WBC: 6 10*3/uL (ref 4.0–11.0)

## 2015-01-06 LAB — GLUCOSE, POC
Glucose (POC): 94 mg/dL (ref 65–105)
Glucose (POC): 95 mg/dL (ref 65–105)
Glucose (POC): 96 mg/dL (ref 65–105)
Glucose (POC): 116 mg/dL — ABNORMAL HIGH (ref 65–105)

## 2015-01-06 LAB — HGB & HCT
HCT: 30.5 % — ABNORMAL LOW (ref 37.0–50.0)
HCT: 31.7 % — ABNORMAL LOW (ref 37.0–50.0)
HGB: 9 gm/dl — ABNORMAL LOW (ref 13.0–17.2)
HGB: 9.5 gm/dl — ABNORMAL LOW (ref 13.0–17.2)

## 2015-01-06 LAB — MAGNESIUM: Magnesium: 1.8 mg/dl (ref 1.8–2.4)

## 2015-01-06 MED ORDER — FLUOXETINE 20 MG CAP
20 mg | Freq: Every day | ORAL | Status: DC
Start: 2015-01-06 — End: 2015-01-07
  Administered 2015-01-07: 13:00:00 via ORAL

## 2015-01-06 MED FILL — FLUOXETINE 20 MG CAP: 20 mg | ORAL | Qty: 1

## 2015-01-06 MED FILL — CLARITHROMYCIN 500 MG TAB: 500 mg | ORAL | Qty: 1

## 2015-01-06 MED FILL — MORPHINE 2 MG/ML INJECTION: 2 mg/mL | INTRAMUSCULAR | Qty: 1

## 2015-01-06 MED FILL — AMOXICILLIN 500 MG CAP: 500 mg | ORAL | Qty: 2

## 2015-01-06 MED FILL — SODIUM CHLORIDE 0.9 % INJECTION: INTRAMUSCULAR | Qty: 10

## 2015-01-06 MED FILL — HYDROXYCHLOROQUINE 200 MG TAB: 200 mg | ORAL | Qty: 1

## 2015-01-06 MED FILL — SUCRALFATE 100 MG/ML ORAL SUSP: 100 mg/mL | ORAL | Qty: 20

## 2015-01-06 MED FILL — LORAZEPAM 2 MG/ML IJ SOLN: 2 mg/mL | INTRAMUSCULAR | Qty: 1

## 2015-01-06 MED FILL — BD POSIFLUSH NORMAL SALINE 0.9 % INJECTION SYRINGE: INTRAMUSCULAR | Qty: 10

## 2015-01-06 MED FILL — POLYETHYLENE GLYCOL 3350 17 GRAM (100 %) ORAL POWDER PACKET: 17 gram | ORAL | Qty: 1

## 2015-01-06 NOTE — Other (Signed)
Bedside and Verbal shift change report given to France RavensMercedes (Cabin crewoncoming nurse) by Inetta Fermoina (offgoing nurse). Report included the following information Kardex and Intake/Output.

## 2015-01-06 NOTE — Other (Signed)
Bedside shift change report given to CHRISTINA JOHANNA ABULENCIA, LPN   (oncoming nurse) by Kathy Payne, RN (offgoing nurse). Report included the following information SBAR, Kardex, Intake/Output, MAR, Recent Results, Med Rec Status and Cardiac Rhythm SR.

## 2015-01-06 NOTE — Progress Notes (Addendum)
Medical Progress Note      NAME: [name] [name]   DOB:  October 31, 1950  MRN:             [id number]    Date/Time: 01/06/2015  12:20 PM      Assessment:     1. Intractable nausea, vomiting, diarrhea, not tolerating PO yet  2. Gastritis on EGD  3. H. Pylori Infection  4. Chronic iron deficiency anemia.   5. History of PE and DVT, on Coumadin.   6. History of gastric ulcer, status post partial gastrectomy.  7. CAD, status post stents.  8. Hypertension, uncontrolled.  9. Chronic pain with fibromyalgia.  10. Anxiety and depression,     Plan:     ?? Continue to complain, nausea and vomiting  ?? Continue treatment for H. Pylori  ?? Advance diet as tolerated  ?? Continue PPI, PRN zofran  ?? Still depressed, increase Prozac to 40 mg per psych recommendation.      Subjective:     C/o Nausea, vomiting,     Objective:     Vitals:      Last 24hrs VS reviewed since prior progress note. Most recent are:  Visit Vitals   Item Reading   ??? BP 119/66 mmHg   ??? Pulse 70   ??? Temp 98.1 ??F (36.7 ??C)   ??? Resp 18   ??? Ht $Remo'5\' 7"'mniDI$  (1.702 m)   ??? Wt 57.5 kg (126 lb 12.2 oz)   ??? BMI 19.85 kg/m2   ??? SpO2 97%     SpO2 Readings from Last 6 Encounters:   01/06/15 97%   12/22/14 100%   11/29/14 100%   06/15/13 100%   03/29/12 98%   01/20/12 100%            Intake/Output Summary (Last 24 hours) at 01/06/15 1219  Last data filed at 01/06/15 8657   Gross per 24 hour   Intake 2953.75 ml   Output   2100 ml   Net 853.75 ml        Exam:     General:   Not in acute distress  HEENT: PERRLA, Neck Supple,  No JVD  Respiratory:   CTA bilaterally-no wheezes, rales, rhonchi, or crackles  Cardiac:  Regular Rate and Rythmn  - no murmurs, rubs or gallops  Abdominal:  Soft, non-tender, non-distended, positive bowel sounds  Extremities:  No cyanosis, or edema.  Skin: No rash  Neurological:  No focal neurological deficits    Medication:     Current Medications Reviewed    Current facility-administered medications:    ???  polyethylene glycol (MIRALAX) packet 17 g, 17 g, Oral, DAILY, Charmaine Downs, NP, 17 g at 01/06/15 8469  ???  albuterol-ipratropium (DUO-NEB) 2.5 MG-0.5 MG/3 ML, 3 mL, Nebulization, Q6H PRN, Stanton Kidney, MD  ???  amoxicillin (AMOXIL) capsule 1,000 mg, 1,000 mg, Oral, Q12H, Polo Riley, MD, 1,000 mg at 01/06/15 6295  ???  clarithromycin (BIAXIN) tablet 500 mg, 500 mg, Oral, BID, Polo Riley, MD, 500 mg at 01/06/15 2841  ???  sucralfate (CARAFATE) 100 mg/mL oral suspension 2 g, 2 g, Oral, ACB&D, Charmaine Downs, NP, 2 g at 01/06/15 0910  ???  FLUoxetine (PROzac) capsule 20 mg, 20 mg, Oral, DAILY, Stanton Kidney, MD, 20 mg at 01/06/15 3244  ???  pantoprazole (PROTONIX) 40 mg in sodium chloride 0.9 % 10 mL injection, 40 mg, IntraVENous, Q12H, Amy L Joya Gaskins, NP, 40 mg at 01/06/15 0904  ???  dextrose 5% and 0.9% NaCl infusion, 75 mL/hr, IntraVENous, CONTINUOUS, Theodis Aguas, MD, Last Rate: 75 mL/hr at 01/06/15 0711, 75 mL/hr at 01/06/15 0711  ???  morphine injection 2-4 mg, 2-4 mg, IntraVENous, Q4H PRN, Theodis Aguas, MD, 2 mg at 01/06/15 8434  ???  LORazepam (ATIVAN) injection 0.5 mg, 0.5 mg, IntraVENous, Q8H PRN, Theodis Aguas, MD, 0.5 mg at 01/06/15 0255  ???  albuterol (PROVENTIL HFA, VENTOLIN HFA, PROAIR HFA) inhaler 1 Puff, 1 Puff, Inhalation, Q4H PRN, Theodis Aguas, MD  ???  hydroxychloroquine (PLAQUENIL) tablet 200 mg, 200 mg, Oral, BID, Theodis Aguas, MD, 200 mg at 01/06/15 2294  ???  naloxone (NARCAN) injection 0.1 mg, 0.1 mg, IntraVENous, PRN, Theodis Aguas, MD  ???  glucagon (GLUCAGEN) injection 1 mg, 1 mg, IntraMUSCular, PRN, Theodis Aguas, MD  ???  dextrose (D50W) injection syrg 10-15 g, 20-30 mL, IntraVENous, PRN, Theodis Aguas, MD, 10 g at 01/01/15 2106    Lab:     Lab Data Reviewed: (see below)  Recent Results (from the past 24 hour(s))   GLUCOSE, POC    Collection Time: 01/05/15  5:08 PM   Result Value Ref Range    Glucose (POC) 86 65 - 105 mg/dL   GLUCOSE, POC    Collection Time: 01/05/15  8:48 PM    Result Value Ref Range    Glucose (POC) 94 65 - 105 mg/dL   HGB & HCT    Collection Time: 01/05/15  9:37 PM   Result Value Ref Range    HGB 9.0 (L) 13.0 - 17.2 gm/dl    HCT 59.1 (L) 65.9 - 50.0 %   CBC WITH AUTOMATED DIFF    Collection Time: 01/06/15  5:02 AM   Result Value Ref Range    WBC 6.0 4.0 - 11.0 1000/mm3    RBC 3.60 3.60 - 5.20 M/uL    HGB 9.0 (L) 13.0 - 17.2 gm/dl    HCT 45.7 (L) 78.3 - 50.0 %    MCV 80.6 80.0 - 98.0 fL    MCH 25.0 (L) 25.4 - 34.6 pg    MCHC 31.0 30.0 - 36.0 gm/dl    PLATELET 145 306 - 238 1000/mm3    MPV 11.3 (H) 6.0 - 10.0 fL    RDW-SD 77.1 (H) 36.4 - 46.3      NRBC 0 0 - 0      IMMATURE GRANULOCYTES 0.2 0.0 - 3.0 %    NEUTROPHILS 68.1 (H) 34 - 64 %    LYMPHOCYTES 21.9 (L) 28 - 48 %    MONOCYTES 6.1 1 - 13 %    EOSINOPHILS 3.0 0 - 5 %    BASOPHILS 0.7 0 - 3 %    Poikilocytosis 1+      ANISOCYTOSIS 1+      HYPOCHROMASIA 1+      MICROCYTES 1+      Codocytes 1+      Schistocytes OCCASIONAL      Dacrocytes OCCASIONAL      PLATELET COMMENTS NORMAL      LARGE PLATELETS FEW     METABOLIC PANEL, COMPREHENSIVE    Collection Time: 01/06/15  5:02 AM   Result Value Ref Range    Sodium 143 136 - 145 mEq/L    Potassium 3.7 3.5 - 5.1 mEq/L    Chloride 108 (H) 98 - 107 mEq/L    CO2 26 21 - 32 mEq/L    Glucose 98 74 - 106  mg/dl    BUN 8 7 - 25 mg/dl    Creatinine 0.6 0.6 - 1.3 mg/dl    GFR est AA >60.0      GFR est non-AA >60      Calcium 7.8 (L) 8.5 - 10.1 mg/dl    AST 13 (L) 15 - 37 U/L    ALT 13 12 - 78 U/L    Alk. phosphatase 63 45 - 117 U/L    Bilirubin, total 0.4 0.2 - 1.0 mg/dl    Protein, total 5.5 (L) 6.4 - 8.2 gm/dl    Albumin 2.5 (L) 3.4 - 5.0 gm/dl   MAGNESIUM    Collection Time: 01/06/15  5:02 AM   Result Value Ref Range    Magnesium 1.8 1.8 - 2.4 mg/dl   GLUCOSE, POC    Collection Time: 01/06/15  8:12 AM   Result Value Ref Range    Glucose (POC) 95 65 - 105 mg/dL   HGB & HCT    Collection Time: 01/06/15 10:46 AM   Result Value Ref Range    HGB 9.5 (L) 13.0 - 17.2 gm/dl     HCT 31.7 (L) 37.0 - 50.0 %   GLUCOSE, POC    Collection Time: 01/06/15 11:56 AM   Result Value Ref Range    Glucose (POC) 96 65 - 105 mg/dL       Xr Chest Pa Lat    01/01/2015   History: Chest pain, right-sided chest pain  Comparison: 11/26/2014;   Exam: Frontal and lateral chest.  Results:   Heart normal.  Mediastinal contours are normal.  No consolidation.  No pleural effusions.    Mild apical pleural thickening in the right.  No pneumothorax.  No lung  masses.  No free air is seen under the hemidiaphragms.   Osseous structures intact. Surgical clips upper abdomen.     01/01/2015   IMPRESSION:  No acute disease.       Active Problems:    Gastrointestinal hemorrhage (01/01/2015)        Prophylaxis:  Lovenox  Coumadin  Hep SQ  SCD???s  H2B/PPI    Disposition:  Home w/ Family   HH PT,OT,RN   SNF/LTC   SAH/Rehab    Care Plan discussed with:    Patient   Family    Care Manager  ED Doc   Specialist :       _______________________________________________________________________        Attending Physician: Stanton Kidney, MD      [facility]

## 2015-01-07 LAB — CBC WITH AUTOMATED DIFF
BASOPHILS: 0.7 % (ref 0–3)
EOSINOPHILS: 4.4 % (ref 0–5)
HCT: 29.1 % — ABNORMAL LOW (ref 37.0–50.0)
HGB: 8.8 gm/dl — ABNORMAL LOW (ref 13.0–17.2)
IMMATURE GRANULOCYTES: 0.2 % (ref 0.0–3.0)
LYMPHOCYTES: 29.1 % (ref 28–48)
MCH: 24.2 pg — ABNORMAL LOW (ref 25.4–34.6)
MCHC: 30.2 gm/dl (ref 30.0–36.0)
MCV: 79.9 fL — ABNORMAL LOW (ref 80.0–98.0)
MONOCYTES: 7.2 % (ref 1–13)
MPV: 10.2 fL — ABNORMAL HIGH (ref 6.0–10.0)
NEUTROPHILS: 58.4 % (ref 34–64)
NRBC: 0 (ref 0–0)
PLATELET COMMENTS: NORMAL
PLATELET: 174 10*3/uL (ref 140–450)
RBC: 3.64 M/uL (ref 3.60–5.20)
RDW-SD: 77.8 — ABNORMAL HIGH (ref 36.4–46.3)
WBC: 4.3 10*3/uL (ref 4.0–11.0)

## 2015-01-07 LAB — METABOLIC PANEL, COMPREHENSIVE
ALT (SGPT): 10 U/L — ABNORMAL LOW (ref 12–78)
AST (SGOT): 12 U/L — ABNORMAL LOW (ref 15–37)
Albumin: 2.5 gm/dl — ABNORMAL LOW (ref 3.4–5.0)
Alk. phosphatase: 64 U/L (ref 45–117)
BUN: 5 mg/dl — ABNORMAL LOW (ref 7–25)
Bilirubin, total: 0.5 mg/dl (ref 0.2–1.0)
CO2: 28 mEq/L (ref 21–32)
Calcium: 7.9 mg/dl — ABNORMAL LOW (ref 8.5–10.1)
Chloride: 109 mEq/L — ABNORMAL HIGH (ref 98–107)
Creatinine: 0.6 mg/dl (ref 0.6–1.3)
GFR est AA: >60
GFR est non-AA: >60
Glucose: 100 mg/dl (ref 74–106)
Potassium: 3.5 mEq/L (ref 3.5–5.1)
Protein, total: 5.5 gm/dl — ABNORMAL LOW (ref 6.4–8.2)
Sodium: 144 mEq/L (ref 136–145)

## 2015-01-07 LAB — GLUCOSE, POC
Glucose (POC): 105 mg/dL (ref 65–105)
Glucose (POC): 158 mg/dL — ABNORMAL HIGH (ref 65–105)
Glucose (POC): 92 mg/dL (ref 65–105)

## 2015-01-07 LAB — HGB & HCT
HCT: 31.6 % — ABNORMAL LOW (ref 37.0–50.0)
HGB: 9.7 gm/dl — ABNORMAL LOW (ref 13.0–17.2)

## 2015-01-07 LAB — MAGNESIUM: Magnesium: 1.7 mg/dl — ABNORMAL LOW (ref 1.8–2.4)

## 2015-01-07 MED ORDER — CLARITHROMYCIN 500 MG TAB
500 mg | ORAL_TABLET | Freq: Two times a day (BID) | ORAL | Status: AC
Start: 2015-01-07 — End: 2015-01-17

## 2015-01-07 MED ORDER — LAMOTRIGINE 100 MG TAB
100 mg | ORAL_TABLET | Freq: Two times a day (BID) | ORAL | Status: AC
Start: 2015-01-07 — End: ?

## 2015-01-07 MED ORDER — PANTOPRAZOLE 40 MG TAB, DELAYED RELEASE
40 mg | ORAL_TABLET | Freq: Two times a day (BID) | ORAL | Status: AC
Start: 2015-01-07 — End: 2015-01-21

## 2015-01-07 MED ORDER — FLUOXETINE 40 MG CAP
40 mg | ORAL_CAPSULE | Freq: Every day | ORAL | Status: DC
Start: 2015-01-07 — End: 2016-07-14

## 2015-01-07 MED ORDER — AMOXICILLIN 500 MG CAP
500 mg | ORAL_CAPSULE | Freq: Two times a day (BID) | ORAL | Status: AC
Start: 2015-01-07 — End: 2015-01-17

## 2015-01-07 MED FILL — MORPHINE 2 MG/ML INJECTION: 2 mg/mL | INTRAMUSCULAR | Qty: 1

## 2015-01-07 MED FILL — SODIUM CHLORIDE 0.9 % INJECTION: INTRAMUSCULAR | Qty: 10

## 2015-01-07 MED FILL — HYDROXYCHLOROQUINE 200 MG TAB: 200 mg | ORAL | Qty: 1

## 2015-01-07 MED FILL — SUCRALFATE 100 MG/ML ORAL SUSP: 100 mg/mL | ORAL | Qty: 20

## 2015-01-07 MED FILL — CLARITHROMYCIN 500 MG TAB: 500 mg | ORAL | Qty: 1

## 2015-01-07 MED FILL — POLYETHYLENE GLYCOL 3350 17 GRAM (100 %) ORAL POWDER PACKET: 17 gram | ORAL | Qty: 1

## 2015-01-07 MED FILL — AMOXICILLIN 500 MG CAP: 500 mg | ORAL | Qty: 2

## 2015-01-07 MED FILL — FLUOXETINE 20 MG CAP: 20 mg | ORAL | Qty: 2

## 2015-01-07 MED FILL — LORAZEPAM 2 MG/ML IJ SOLN: 2 mg/mL | INTRAMUSCULAR | Qty: 1

## 2015-01-07 NOTE — Discharge Summary (Addendum)
Discharge Summary      Patient ID:    [name] [name]  [id number]  64 y.o.  1951/06/16    Admit date: 01/01/2015    Discharge date and time: 01/07/2015    CONSULTATIONS: GI    Procedures: EGD    Admission Diagnoses: Gastrointestinal hemorrhage  NAUSEA/VOMITING WT. LOSS    Chronic Diagnoses:    Problem List as of 01/07/2015  Date Reviewed: 01/14/15          Codes Class Noted - Resolved    Gastrointestinal hemorrhage ICD-10-CM: K92.2  ICD-9-CM: 578.9  01/01/2015 - Present        GI bleed ICD-10-CM: K92.2  ICD-9-CM: 578.9  11/26/2014 - Present        Back pain, lumbosacral ICD-10-CM: M54.5, M54.89  ICD-9-CM: 724.2, 724.6  09/11/2011 - Present        Arthritis of knee ICD-10-CM: M19.90  ICD-9-CM: 716.96  01/04/2011 - Present        Encounter for long-term (current) use of other medications ICD-10-CM: Z79.899  ICD-9-CM: V58.69  01/04/2011 - Present        Abdominal pain, generalized ICD-10-CM: R10.84  ICD-9-CM: 789.07  01/04/2011 - Present        Neuropathy in diabetes [facility]) ICD-10-CM: E11.40  ICD-9-CM: 250.60, 357.2  01/04/2011 - Present        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  ICD-9-CM: 781.3  01/04/2011 - Present        Lupus (HCC) ICD-10-CM: M32.9  ICD-9-CM: 710.0  12/03/2010 - Present        Osteoarthrosis, unspecified whether generalized or localized, unspecified site ICD-10-CM: M19.90  ICD-9-CM: 715.90  12/03/2010 - Present        Neuralgia, neuritis, and radiculitis, unspecified ICD-10-CM: IMO0002  ICD-9-CM: 729.2  12/03/2010 - Present        Myalgia and myositis, unspecified ICD-10-CM: M79.1, M60.9  ICD-9-CM: 729.1  12/03/2010 - Present        Pain in limb ICD-10-CM: M79.609  ICD-9-CM: 729.5  12/03/2010 - Present        Pain in joint, multiple sites ICD-10-CM: M25.50  ICD-9-CM: 719.49  12/03/2010 - Present        Type II or unspecified type diabetes mellitus without mention of complication, not stated as uncontrolled (HCC) ICD-10-CM: E11.9  ICD-9-CM: 250.00  12/03/2010 - Present        Rib pain ICD-10-CM: R07.81   ICD-9-CM: 786.50  10/10/2010 - Present        RESOLVED: Vitreous opacities of left eye ICD-10-CM: H43.392  ICD-9-CM: 379.24  06/15/2013 - 06/15/2013              HOSPITAL COURSE:     1. Intractable nausea, vomiting, diarrhea, ??    Patient admitted to medical floor  She was having intractable N/V/D  Started on IVF hydration  GI consulted  S/p EGD that showed gastritis and positive for H pylori  Started on Amoxicillin, clarithromycin and PPI  Able to tolerate PO now and d/c home    2. Gastritis on EGD  3. H. Pylori Infection  4. Chronic iron deficiency anemia. ??  5. History of PE and DVT, on Coumadin. ??REFSUME COUMADIN  6. History of gastric ulcer, status post partial gastrectomy.  7. CAD, status post stents.  8. Hypertension, uncontrolled.  9. Chronic pain with fibromyalgia.  10. Anxiety and depression,    Patient was very depressed during her hospital stay. Tele psych was consulted. Started on Prosac.      Discharge Medications:  Current Discharge Medication List      START taking these medications    Details   amoxicillin (AMOXIL) 500 mg capsule Take 2 Caps by mouth every twelve (12) hours for 10 days. Indications: HELICOBACTER PYLORI GASTRITIS  Qty: 40 Cap, Refills: 0      clarithromycin (BIAXIN) 500 mg tablet Take 1 Tab by mouth two (2) times a day for 10 days. Indications: HELICOBACTER PYLORI GASTRITIS  Qty: 20 Tab, Refills: 0      FLUoxetine (PROZAC) 40 mg capsule Take 1 Cap by mouth daily.  Qty: 30 Cap, Refills: 0      pantoprazole (PROTONIX) 40 mg tablet Take 1 Tab by mouth two (2) times a day for 14 days.  Qty: 28 Tab, Refills: 0      lamoTRIgine (LAMICTAL) 100 mg tablet Take 1 Tab by mouth two (2) times a day.  Qty: 60 Tab, Refills: 0         CONTINUE these medications which have NOT CHANGED    Details   oxyCODONE IR (OXY-IR) 15 mg immediate release tablet Take 15 mg by mouth four (4) times daily.      ferrous gluconate 324 mg (36 mg iron) tab Take 1 Tab by mouth Before breakfast and dinner.   Qty: 60 Tab, Refills: 2      hydroxychloroquine (PLAQUENIL) 200 mg tablet Take 200 mg by mouth two (2) times a day.      alendronate-vitamin d3 70-2,800 mg-unit per tablet Take 1 Tab by mouth every seven (7) days.      amitriptyline (ELAVIL) 75 mg tablet Take 1 Tab by mouth nightly.  Qty: 30 Tab, Refills: 1      albuterol (PROVENTIL HFA) 90 mcg/Actuation inhaler Take 1 Puff by inhalation as needed.      albuterol-ipratropium (DUO-NEB) 0.5 mg-3 mg(2.5 mg base)/3 mL nebulizer solution 3 mL by Nebulization route once.      warfarin (COUMADIN) 5 mg tablet Take 5 mg by mouth daily. Take 2 tablets (10mg )      Cholecalciferol, Vitamin D3, (VITAMIN D) 1,000 unit Cap Take 50,000 Units by mouth every seven (7) days.         STOP taking these medications       levETIRAcetam (KEPPRA) 750 mg tablet Comments:   Reason for Stopping:                Significant Diagnostic Studies:   Recent Labs      01/07/15   0458  01/06/15   2136   01/06/15   0502   WBC  4.3   --    --   6.0   HGB  8.8*  9.7*   < >  9.0*   HCT  29.1*  31.6*   < >  29.0*   PLT  174   --    --   176    < > = values in this interval not displayed.     Recent Labs      01/07/15   0458  01/06/15   0502  01/05/15   0324   NA  144  143  145   K  3.5  3.7  3.5   CL  109*  108*  111*   CO2  28  26  27    BUN  5*  8  3*   CREA  0.6  0.6  0.5*   GLU  100  98  88   CA  7.9*  7.8*  7.8*   MG  1.7*  1.8  1.8     Recent Labs      01/07/15   0458  01/06/15   0502  01/05/15   0324   SGOT  12*  13*  12*   AP  64  63  63   TP  5.5*  5.5*  5.4*   ALB  2.5*  2.5*  2.5*     No results for input(s): INR, PTP, APTT in the last 72 hours.    Invalid input(s): INREXT   No results for input(s): FE, TIBC, PSAT, FERR in the last 72 hours.   No results for input(s): PH, PCO2, PO2 in the last 72 hours.  No results for input(s): CPK, CKMB in the last 72 hours.    Invalid input(s): TROPONINI  No components found for: GLPOC    Follow up Care:    1. DANIEL A BLUESTEIN, MD in 1-2 weeks   Need INR checked in 1 week    Diet:  Cardiac Diet    Disposition:  Home.    Discharge Condition: Good    Total D/c time: 40 minutes      Signed:  Theodis AguasYared G Jimia Gentles, MD  01/07/2015  1:17 PM

## 2015-01-07 NOTE — Progress Notes (Signed)
Medical Progress Note      NAME: [name] [name]   DOB:  1951-07-22  MRN:             [id number]    Date/Time: 01/07/2015  1:01 PM      Assessment:     1. Intractable nausea, vomiting, diarrhea,   2. Gastritis on EGD  3. H. Pylori Infection  4. Chronic iron deficiency anemia.   5. History of PE and DVT, on Coumadin.   6. History of gastric ulcer, status post partial gastrectomy.  7. CAD, status post stents.  8. Hypertension, uncontrolled.  9. Chronic pain with fibromyalgia.  10. Anxiety and depression,     Plan:     ?? Patient has minimal oral intake.  ?? Advance diet as tolerated  ?? Continue IVF hydration for now  ?? Continue treatment for H. Pylori  ?? Continue PPI, PRN zofran  ?? Increased Prozac to 40 mg yesterday  ?? Planning to d/c tomorrow  ?? PT consult      Subjective:     Decreased oral intake, no abdominal pain    Objective:     Vitals:      Last 24hrs VS reviewed since prior progress note. Most recent are:  Visit Vitals   Item Reading   ??? BP 115/61 mmHg   ??? Pulse 70   ??? Temp 98.1 ??F (36.7 ??C)   ??? Resp 16   ??? Ht $Remo'5\' 7"'zkUnL$  (1.702 m)   ??? Wt 54.7 kg (120 lb 9.5 oz)   ??? BMI 18.88 kg/m2   ??? SpO2 100%     SpO2 Readings from Last 6 Encounters: 01/07/15 100%   12/22/14 100%   11/29/14 100%   06/15/13 100%   03/29/12 98%   01/20/12 100%            Intake/Output Summary (Last 24 hours) at 01/07/15 1301  Last data filed at 01/07/15 1133   Gross per 24 hour   Intake   4271 ml   Output   1350 ml   Net   2921 ml        Exam:     General:   Not in acute distress  HEENT: PERRLA, Neck Supple,  No JVD  Respiratory:   CTA bilaterally-no wheezes, rales, rhonchi, or crackles  Cardiac:  Regular Rate and Rythmn  - no murmurs, rubs or gallops  Abdominal:  Soft, non-tender, non-distended, positive bowel sounds  Extremities:  No cyanosis, or edema.  Skin: No rash  Neurological:  No focal neurological deficits    Medication:     Current Medications Reviewed    Current facility-administered medications:    ???  FLUoxetine (PROzac) capsule 40 mg, 40 mg, Oral, DAILY, Stanton Kidney, MD, 40 mg at 01/07/15 0914  ???  polyethylene glycol (MIRALAX) packet 17 g, 17 g, Oral, DAILY, Charmaine Downs, NP, 17 g at 01/07/15 0914  ???  albuterol-ipratropium (DUO-NEB) 2.5 MG-0.5 MG/3 ML, 3 mL, Nebulization, Q6H PRN, Stanton Kidney, MD  ???  amoxicillin (AMOXIL) capsule 1,000 mg, 1,000 mg, Oral, Q12H, Polo Riley, MD, 1,000 mg at 01/07/15 0913  ???  clarithromycin (BIAXIN) tablet 500 mg, 500 mg, Oral, BID, Polo Riley, MD, 500 mg at 01/07/15 6578  ???  sucralfate (CARAFATE) 100 mg/mL oral suspension 2 g, 2 g, Oral, ACB&D, Charmaine Downs, NP, 2 g at 01/07/15 0757  ???  pantoprazole (PROTONIX) 40 mg in sodium chloride 0.9 % 10 mL injection, 40 mg, IntraVENous,  Q12H, Nelta Numbers, NP, 40 mg at 01/07/15 0914  ???  dextrose 5% and 0.9% NaCl infusion, 75 mL/hr, IntraVENous, CONTINUOUS, Theodis Aguas, MD, Last Rate: 75 mL/hr at 01/06/15 2127, 75 mL/hr at 01/06/15 2127  ???  morphine injection 2-4 mg, 2-4 mg, IntraVENous, Q4H PRN, Theodis Aguas, MD, 2 mg at 01/07/15 0847  ???  LORazepam (ATIVAN) injection 0.5 mg, 0.5 mg, IntraVENous, Q8H PRN, Theodis Aguas, MD, 0.5 mg at 01/06/15 2122  ???  albuterol (PROVENTIL HFA, VENTOLIN HFA, PROAIR HFA) inhaler 1 Puff, 1 Puff, Inhalation, Q4H PRN, Theodis Aguas, MD  ???  hydroxychloroquine (PLAQUENIL) tablet 200 mg, 200 mg, Oral, BID, Theodis Aguas, MD, 200 mg at 01/07/15 0914  ???  naloxone (NARCAN) injection 0.1 mg, 0.1 mg, IntraVENous, PRN, Theodis Aguas, MD  ???  glucagon (GLUCAGEN) injection 1 mg, 1 mg, IntraMUSCular, PRN, Theodis Aguas, MD  ???  dextrose (D50W) injection syrg 10-15 g, 20-30 mL, IntraVENous, PRN, Theodis Aguas, MD, 10 g at 01/01/15 2106    Lab:     Lab Data Reviewed: (see below)  Recent Results (from the past 24 hour(s))   GLUCOSE, POC    Collection Time: 01/06/15  4:09 PM   Result Value Ref Range    Glucose (POC) 116 (H) 65 - 105 mg/dL   GLUCOSE, POC    Collection Time: 01/06/15  8:18 PM    Result Value Ref Range    Glucose (POC) 158 (H) 65 - 105 mg/dL   HGB & HCT    Collection Time: 01/06/15  9:36 PM   Result Value Ref Range    HGB 9.7 (L) 13.0 - 17.2 gm/dl    HCT 33.8 (L) 25.0 - 50.0 %   CBC WITH AUTOMATED DIFF    Collection Time: 01/07/15  4:58 AM   Result Value Ref Range    WBC 4.3 4.0 - 11.0 1000/mm3    RBC 3.64 3.60 - 5.20 M/uL    HGB 8.8 (L) 13.0 - 17.2 gm/dl    HCT 53.9 (L) 76.7 - 50.0 %    MCV 79.9 (L) 80.0 - 98.0 fL    MCH 24.2 (L) 25.4 - 34.6 pg    MCHC 30.2 30.0 - 36.0 gm/dl    PLATELET 341 937 - 902 1000/mm3    MPV 10.2 (H) 6.0 - 10.0 fL    RDW-SD 77.8 (H) 36.4 - 46.3      NRBC 0 0 - 0      IMMATURE GRANULOCYTES 0.2 0.0 - 3.0 %    NEUTROPHILS 58.4 34 - 64 %    LYMPHOCYTES 29.1 28 - 48 %    MONOCYTES 7.2 1 - 13 %    EOSINOPHILS 4.4 0 - 5 %    BASOPHILS 0.7 0 - 3 %    Poikilocytosis 1+      ANISOCYTOSIS 2+      HYPOCHROMASIA 1+      MICROCYTES 1+      Codocytes OCCASIONAL      Schistocytes OCCASIONAL      Dacrocytes OCCASIONAL      Elliptocytes OCCASIONAL      Crenated RBCs OCCASIONAL      PLATELET COMMENTS NORMAL      Giant platelets FEW     METABOLIC PANEL, COMPREHENSIVE    Collection Time: 01/07/15  4:58 AM   Result Value Ref Range    Sodium 144 136 - 145 mEq/L    Potassium 3.5 3.5 -  5.1 mEq/L    Chloride 109 (H) 98 - 107 mEq/L    CO2 28 21 - 32 mEq/L    Glucose 100 74 - 106 mg/dl    BUN 5 (L) 7 - 25 mg/dl    Creatinine 0.6 0.6 - 1.3 mg/dl    GFR est AA >60.0      GFR est non-AA >60      Calcium 7.9 (L) 8.5 - 10.1 mg/dl    AST 12 (L) 15 - 37 U/L    ALT 10 (L) 12 - 78 U/L    Alk. phosphatase 64 45 - 117 U/L    Bilirubin, total 0.5 0.2 - 1.0 mg/dl    Protein, total 5.5 (L) 6.4 - 8.2 gm/dl    Albumin 2.5 (L) 3.4 - 5.0 gm/dl   MAGNESIUM    Collection Time: 01/07/15  4:58 AM   Result Value Ref Range    Magnesium 1.7 (L) 1.8 - 2.4 mg/dl   GLUCOSE, POC    Collection Time: 01/07/15  7:49 AM   Result Value Ref Range    Glucose (POC) 92 65 - 105 mg/dL   GLUCOSE, POC     Collection Time: 01/07/15 11:47 AM   Result Value Ref Range    Glucose (POC) 105 65 - 105 mg/dL       Xr Chest Pa Lat    01/01/2015   History: Chest pain, right-sided chest pain  Comparison: 11/26/2014;   Exam: Frontal and lateral chest.  Results:   Heart normal.  Mediastinal contours are normal.  No consolidation.  No pleural effusions.    Mild apical pleural thickening in the right.  No pneumothorax.  No lung  masses.  No free air is seen under the hemidiaphragms.   Osseous structures intact. Surgical clips upper abdomen.     01/01/2015   IMPRESSION:  No acute disease.       Active Problems:    Gastrointestinal hemorrhage (01/01/2015)        Prophylaxis:  Lovenox  Coumadin  Hep SQ  SCD???s  H2B/PPI    Disposition:  Home w/ Family   HH PT,OT,RN   SNF/LTC   SAH/Rehab    Care Plan discussed with:    Patient   Family    Care Manager  ED Doc   Specialist :       _______________________________________________________________________        Attending Physician: Stanton Kidney, MD      [facility]

## 2015-01-07 NOTE — Other (Signed)
Bedside shift change report given to Dallas SchimkeAngela Long, RN  (oncoming nurse) by Camila LiMercedes RN (offgoing nurse). Report included the following information SBAR, Kardex, Intake/Output, MAR, Recent Results and Cardiac Rhythm SR.

## 2015-02-26 ENCOUNTER — Encounter: Primary: Internal Medicine

## 2015-02-26 ENCOUNTER — Inpatient Hospital Stay
Admit: 2015-02-26 | Discharge: 2015-02-27 | Disposition: A | Discharge status: Home / Self Care | Payer: MEDICAID | Attending: Emergency Medicine

## 2015-02-26 ENCOUNTER — Emergency Department: Admit: 2015-02-27 | Payer: MEDICAID | Primary: Internal Medicine

## 2015-02-26 DIAGNOSIS — S0990XA Unspecified injury of head, initial encounter: Secondary | ICD-10-CM

## 2015-02-26 NOTE — ED Notes (Signed)
Purposeful rounding completed:    Side rails up x 2:  YES  Bed low and wheels and locked: YES  Call bell in reach: YES  Comfort addressed: YES    Toileting needs addressed: YES  Plan of care reviewed/updated with patient and or family members: YES  IV site assessed: YES  Pain assessed and addressed: YES, 6 right hip

## 2015-02-26 NOTE — ED Notes (Signed)
Pt tolerated po fluid well, denies n/v, ambulate well to restroom with assist.

## 2015-02-26 NOTE — ED Notes (Signed)
Pt arrived via ambulance, c/o pain 10/10 on pain scale, follow command, VS stable.

## 2015-02-26 NOTE — ED Provider Notes (Signed)
HPI Comments: [name] [name] is a walker dependent 64 y.o. Frail african [place] female smoker h/o CVA,  presents to the ED via EMS c/o HA, R sided facial pain, neck pain, LBP, R hip pain after slipping on wet floor in her home just PTA. When she fell she hit her head, as well as landed on her right hip. She denies LOC, but admits to dull right sided headache. She reports she urinated on herself x 1 after the fall and is extreme pain in her right hip. She denies any preceding sx. She denies fever/chills, weakness, HA, dizziness/LOC, double vision, facial droop, slurred speech, extremity weakness/numbness/tingling, cp, palps, sob, cough, calf pain/swelling, abd pain, n/v, loss of bowel control, saddle anesthesia.          Patient is a 64 y.o. female presenting with fall, hip pain, and facial pain.   Fall  Associated symptoms include headaches. Pertinent negatives include no fever, no numbness, no abdominal pain, no nausea, no vomiting, no hematuria and no laceration.   Hip Injury   Associated symptoms include back pain and neck pain. Pertinent negatives include no numbness.   Facial Pain   Pertinent negatives include no numbness, no vomiting and no weakness.        Past Medical History:   Diagnosis Date   ??? Seizure ([place])    ??? Stroke [facility])    ??? Adjustment disorder with mixed anxiety and depressed mood    ??? Aneurysm of internal carotid artery    ??? Cervicalgia    ??? Chronic pain syndrome    ??? Coccydynia    ??? Fibromyalgia    ??? Hemiparesis, right ([place])    ??? Hyperlipidemia    ??? Irregular sleep-wake rhythm    ??? Knee joint pain    ??? Muscle spasm    ??? Osteoporosis    ??? Subclinical hypothyroidism    ??? Seizures ([place])    ??? Autoimmune disease ([place])    ??? Pancreatitis    ??? CAD (coronary artery disease)    ??? Gastric ulcer    ??? Diabetes mellitus      NIDDM   ??? Normocytic anemia      Chronic Iron Deficiency Anemia   ??? Vitamin D deficiency    ??? Hypertension    ??? GI bleed 01/01/2015     [place] Admit 5/2-01/07/2015    ??? Chronic low back pain 10/22/2014     [facility] MRI Lumbar w/o contrast: Moderate facet arthropathy at L4-L5 and L5-S1. Mild/moderate foraminal stenosis at these levels. Disc bulge with annular tear at L4-L5.    ??? Generalized osteoarthritis of multiple sites    ??? Lumbar spondylosis      Orthopedic Spine Surgeon: Dr. Barnabas Lister L. Sonny Masters    ??? Arthritis of knee    ??? Peripheral neuropathy    ??? Sensory ataxia    ??? SLE (systemic lupus erythematosus) ([place])    ??? H. pylori infection 01/03/2015     [place] Admission, EGD + H. Pylori/Gastritis   ??? Gastritis 01/03/2015     [place] Admission, EGD + H. Pylori/Gastritis   ??? Asthma    ??? DVT (deep venous thrombosis) ([place]) 05/19/2007     [place] CTA: 1. Small nonocclusive pulmonary embolus in a subsegmental branch to the left lower lobe. Larger filling defect in a segmental branch to the right lower lobe, not as low in attenuation as is normally seen with a pulmonary embolus, however is seen in multiple planes and remains concerning for an  additional nonocclusive pulmonary embolus.   ??? Pulmonary embolism (HCC)      Multiple   ??? Chest pain 03/17/2014     Dobutamine Stress Echo: NORMAL WALL MOTION AND GLOBAL SYSTOLIC FUNCTION AT REST AND AFTER?? STRESS/EXERCISE WITH APPROPRIATE AUGMENTATION OF FUNCTION IN ALL SEGMENTS. NO EVIDENCE OF INDUCIBLE ISCHEMIA AT ADEQUATE HEART RATE WITH DOBUTAMINE STRESS.??            ??? Acute hypercapnic respiratory failure ([place]) 02/05/2013     W/ Encephalopathy requiring BiPAP, Etiology Uncertain however Possible Narcotic Benzodiazipine Related?        Past Surgical History:   Procedure Laterality Date   ??? Hx hysterectomy     ??? Hx cholecystectomy     ??? Pr cardiac surg procedure unlist       Cardiac Cath PCI w/ Stents   ??? Hx gi       Partial Gastrectomy    ??? Hx gi  01/03/2015     [facility] EGD by Dr. Darnell Level D. [place]: S/P B-2 Gastric Resection, Gastritis in small remnant, Bx of Jejunum r/o Celiac Disease and of Stomach. + Gastritis + H. Pylori         Family History:    Problem Relation Age of Onset   ??? Diabetes Maternal Grandmother        History     Social History   ??? Marital Status: DIVORCED     Spouse Name: N/A   ??? Number of Children: N/A   ??? Years of Education: N/A     Occupational History   ??? Not on file.     Social History Main Topics   ??? Smoking status: Former Smoker   ??? Smokeless tobacco: Never Used   ??? Alcohol Use: No   ??? Drug Use: No   ??? Sexual Activity: Not on file     Other Topics Concern   ??? Not on file     Social History Narrative         ALLERGIES: Aspirin; Opana; and Tylenol    Review of Systems   Constitutional: Negative for fever, chills, diaphoresis and fatigue.   HENT: Negative for ear pain, nosebleeds and sore throat.    Eyes: Negative for pain and visual disturbance.   Respiratory: Negative for cough and shortness of breath.    Cardiovascular: Negative for chest pain, palpitations and leg swelling.   Gastrointestinal: Negative for nausea, vomiting, abdominal pain and diarrhea.   Genitourinary: Negative for dysuria, urgency, frequency, hematuria and difficulty urinating.        Urinary Incontinence x 1 after the fall, reported extreme R hip pain at that moment.    Musculoskeletal: Positive for back pain, arthralgias and neck pain. Negative for joint swelling and neck stiffness.   Skin: Negative for color change, pallor, rash and wound.   Neurological: Positive for headaches. Negative for dizziness, seizures, syncope, speech difficulty, weakness, light-headedness and numbness.   Psychiatric/Behavioral: Negative for behavioral problems. The patient is not nervous/anxious.        Filed Vitals:    02/27/15 0130 02/27/15 0200 02/27/15 0230 02/27/15 0300   BP:    122/71   Pulse: 90 85 79 77   Temp:       Resp: _0 Height:       Weight:       SpO2: 100% 100% 99% 100%            Physical Exam   Constitutional: She is oriented to  person, place, and time. She appears well-developed and well-nourished. No distress.   HENT:    Head: Normocephalic and atraumatic. Head is without raccoon's eyes, without Battle's sign, without abrasion, without contusion and without laceration.   Right Ear: Hearing, tympanic membrane, external ear and ear canal normal.   Left Ear: Hearing, tympanic membrane, external ear and ear canal normal.   Nose: Nose normal. No mucosal edema, rhinorrhea, sinus tenderness, nasal deformity, septal deviation or nasal septal hematoma. No epistaxis.   Mouth/Throat: Uvula is midline, oropharynx is clear and moist and mucous membranes are normal. No trismus in the jaw. Normal dentition. No uvula swelling or lacerations. No oropharyngeal exudate, posterior oropharyngeal edema, posterior oropharyngeal erythema or tonsillar abscesses.   TTP overlying the R side of the face.    Eyes: Conjunctivae and EOM are normal. Pupils are equal, round, and reactive to light. Right eye exhibits no discharge. Left eye exhibits no discharge.   Neck: Trachea normal, normal range of motion, full passive range of motion without pain and phonation normal. Neck supple. No tracheal tenderness, no spinous process tenderness and no muscular tenderness present. No rigidity. No tracheal deviation, no edema, no erythema and normal range of motion present. No thyroid mass and no thyromegaly present.   Cardiovascular: Normal rate, regular rhythm and normal heart sounds.  Exam reveals no gallop and no friction rub.    No murmur heard.  Pulmonary/Chest: Effort normal and breath sounds normal. No accessory muscle usage or stridor. No tachypnea. No respiratory distress. She has no decreased breath sounds. She has no wheezes. She has no rhonchi. She has no rales.   Abdominal: Soft. Bowel sounds are normal. She exhibits no mass. There is no tenderness. There is no rigidity, no rebound, no guarding, no CVA tenderness, no tenderness at McBurney's point and negative Murphy's sign.   Genitourinary: Rectum normal. Rectal exam shows no external hemorrhoid, no  internal hemorrhoid, no fissure, no mass, no tenderness and anal tone normal. Guaiac negative stool.   Rectal Tone is normal. No saddle anesthesia. Heme NEG Stool.    Musculoskeletal:        Right shoulder: Normal.        Left shoulder: Normal.        Right hip: She exhibits decreased range of motion, decreased strength, tenderness, bony tenderness and swelling. She exhibits no crepitus, no deformity and no laceration.        Left hip: Normal.        Right knee: Normal.        Left knee: Normal.        Right ankle: Normal. No tenderness.        Left ankle: Normal. No tenderness.        Cervical back: She exhibits tenderness, bony tenderness, pain and spasm. She exhibits no swelling, no edema, no deformity, no laceration and normal pulse.        Thoracic back: Normal.        Lumbar back: She exhibits tenderness, bony tenderness, pain and spasm. She exhibits normal range of motion, no swelling, no edema, no deformity, no laceration and normal pulse.        Right upper leg: Normal. She exhibits no tenderness, no bony tenderness, no swelling, no edema, no deformity and no laceration.        Left upper leg: Normal.        Right lower leg: Normal. She exhibits no tenderness, no bony tenderness, no swelling, no edema, no deformity and  no laceration.        Left lower leg: Normal. She exhibits no tenderness, no bony tenderness, no swelling, no edema, no deformity and no laceration.        Right foot: Normal.        Left foot: Normal.   Lymphadenopathy:     She has no cervical adenopathy.   Neurological: She is alert and oriented to person, place, and time. She has normal strength. She is not disoriented. No cranial nerve deficit or sensory deficit.   No focal neuro deficits. MS 5/5 BUE/BLE. Speech is clear. (Reports chronic right sided weakness but appears equal on my exam).    Skin: Skin is warm and dry. Bruising and ecchymosis noted. No abrasion and no laceration noted. She is not diaphoretic.    R Hip: Swelling/Bruising noted. + TTP. Will not allow movement of the Hip d/t pain.     DP/PT 2+ She is able to flex/extend BLE Knees, additionally, FROM BLE Ankles/Feet. NVI.        Psychiatric: She has a normal mood and affect. Her behavior is normal.   Nursing note and vitals reviewed.       MDM  Number of Diagnoses or Management Options  Acute bilateral low back pain without sciatica: new and requires workup  Cervical sprain, initial encounter: new and requires workup  CHI (closed head injury), initial encounter: new and requires workup  Contusion of right hip, initial encounter: new and requires workup  Fall, initial encounter: new and requires workup  Headache, unspecified headache type: new and requires workup  Normocytic anemia: new and requires workup  Right sided facial pain: new and requires workup  Subtherapeutic international normalized ratio (INR): new and requires workup  Diagnosis management comments: DDX: Migraine, Tension, Cluster, ICH, Cervical sprain, Cervical strain, Skull Fracture, CVA, TIA, Concussion with or without LOC, CHI, Skull Contusion, Neoplasm    DDX: Cervical Sprain/Strain, Fracture (Atlantoaxial Injury and Dysfunction), Brachial Plexus Injury, Cervical Disc Injury, Cervical Discogenic Pain Syndrome, Cervical Facet Syndrome, Cervical Radiculopathy, Myofascial Pain.     DDX: Sciatica, Lumbar radiculopathy, Back Sprain, Back Strain, DJD, HNP, Epidural abscess, Cauda Equina Syndrome, Contusion, Pyelonephritis, Renal Colic, Rib Fracture, Rib Contusion, Costochondritis, Pleurisy, Pleurodynia, Pneumothorax, Thoracic Aneurysm, Abdominal Aortic Aneurysm, Nephro-ureteral Calculus, Acute Myocardial Infarction.     DDX: right Hip Contusion, Hip Sprain/Strain, Fracture, Dislocation, Trochanteric Bursitis, Septic Arthritis.     Consulted with Dr. Fannie Knee (EP) concerning patient [name] [name], standard discussion of reason for visit, HPI, ROS, PE, and  current results available. He has interviewed & examined the patient. He agrees with CT studies ordered however; would like CT facial bones give blunt trauma to the R side of the face.     [name] Catchings, PA  February 26, 2015  8:25 PM     PRELIMINARY READINGS BY RESIDENT DR. Hunt Oris. REESE @ 2223 CT HEAD, FACIAL BONES, CERVICAL SPINE, LUMBAR SPINE, R HIP WITHOUT ACUTE FINDINGS.   0050: CXR no acute process or infiltrate.     4:22 AM: Patient has ambulated with assistance as she has no walker here. Steady gait, not experiencing pain. No focal neuro deficits. No red flag sx for Cauda Equina. Rectal Tone is normal. No saddle anesthesia. Labs reassuring, however; noted INR subtherapeutic.     Consulted with Dr. Veneta Penton concerning patient [name] [name], standard discussion of reason for visit, HPI, ROS, PE, and current results available.  Recommendation for against having the patient increase Coumadin, she can  f/u w/ PCM in am to discuss options. Recommends d/c with outpatient follow-up.     [name] [name], [place]  February 27, 2015  4:23 AM     Reviewed workup results, any meds, and discharge instructions OR admission plan with patient and any family present.  Answered all questions. [name] Catchings, PA  4:24 AM    PLEASE FOLLOW-UP AS DIRECTED WITHOUT FAIL WITHIN THE TIME FRAME RECOMMENDED AS FAILURE TO DO SO COULD RESULT IN WORSENING OF YOUR PHYSICAL CONDITION, DEATH, AND OR PERMANENT DISABILITY.     RETURN TO THE EMERGENCY DEPARTMENT AT IF YOU ARE UNABLE TO FOLLOW-UP AS DIRECTED.     RETURN TO THE EMERGENCY DEPARTMENT AT ONCE IF YOU HAVE SYMPTOMS THAT DO NOT IMPROVE WITH TREATMENT, NEW SYMPTOMS, WORSENING SYMPTOMS, OR ANY OTHER CONCERNS.     THE PATIENT AGREES WITH THE DISCHARGE PLAN AND FOLLOW-UP INSTRUCTIONS. THE PATIENT AGREES TO REVIEW ALL HANDOUTS.          Amount and/or Complexity of Data Reviewed  Clinical lab tests: ordered and reviewed  Tests in the radiology section of CPT??: ordered and reviewed   Tests in the medicine section of CPT??: reviewed and ordered  Discussion of test results with the performing providers: yes  Decide to obtain previous medical records or to obtain history from someone other than the patient: yes  Review and summarize past medical records: yes  Discuss the patient with other providers: yes  Independent visualization of images, tracings, or specimens: yes    Risk of Complications, Morbidity, and/or Mortality  Presenting problems: moderate  Diagnostic procedures: moderate  Management options: moderate    Patient Progress  Patient progress: stable      Procedures    Diagnosis:   1. Fall, initial encounter    2. CHI (closed head injury), initial encounter    3. Right sided facial pain    4. Cervical sprain, initial encounter    5. Acute bilateral low back pain without sciatica    6. Contusion of right hip, initial encounter    7. Headache, unspecified headache type    8. Normocytic anemia    9. Subtherapeutic international normalized ratio (INR)          Disposition: HOME    Follow-up Information     Follow up With Details Comments Contact Info    [facility] EMERGENCY DEPT  As needed, If symptoms worsen [address]  [place]    Altamese Dilling, MD Call today Schedule appointment to be seen within 1 day.  [address]  [place] [address]  [phone]      Marlou Starks, MD Call today Orthopedic: Schedule appointment to be seen within 2-3 days.  [address]   [address]  [place] [address]  [phone]            Current Discharge Medication List      CONTINUE these medications which have NOT CHANGED    Details   FLUoxetine (PROZAC) 40 mg capsule Take 1 Cap by mouth daily.  Qty: 30 Cap, Refills: 0      lamoTRIgine (LAMICTAL) 100 mg tablet Take 1 Tab by mouth two (2) times a day.  Qty: 60 Tab, Refills: 0      oxyCODONE IR (OXY-IR) 15 mg immediate release tablet Take 15 mg by mouth four (4) times daily.       ferrous gluconate 324 mg (36 mg iron) tab Take 1 Tab by mouth Before breakfast  and dinner.  Qty: 60 Tab, Refills: 2      hydroxychloroquine (PLAQUENIL) 200 mg tablet Take 200 mg by mouth two (2) times a day.      alendronate-vitamin d3 70-2,800 mg-unit per tablet Take 1 Tab by mouth every seven (7) days.      amitriptyline (ELAVIL) 75 mg tablet Take 1 Tab by mouth nightly.  Qty: 30 Tab, Refills: 1      albuterol (PROVENTIL HFA) 90 mcg/Actuation inhaler Take 1 Puff by inhalation as needed.      albuterol-ipratropium (DUO-NEB) 0.5 mg-3 mg(2.5 mg base)/3 mL nebulizer solution 3 mL by Nebulization route once.      warfarin (COUMADIN) 5 mg tablet Take 5 mg by mouth daily. Take 2 tablets (62m)      Cholecalciferol, Vitamin D3, (VITAMIN D) 1,000 unit Cap Take 50,000 Units by mouth every seven (7) days.             Recent Results (from the past 24 hour(s))   CBC WITH AUTOMATED DIFF    Collection Time: 02/26/15  8:20 PM   Result Value Ref Range    WBC 5.4 4.6 - 13.2 K/uL    RBC 3.82 (L) 4.20 - 5.30 M/uL    HGB 10.0 (L) 12.0 - 16.0 g/dL    HCT 32.5 (L) 35.0 - 45.0 %    MCV 85.1 74.0 - 97.0 FL    MCH 26.2 24.0 - 34.0 PG    MCHC 30.8 (L) 31.0 - 37.0 g/dL    RDW 23.5 (H) 11.6 - 14.5 %    PLATELET 236 135 - 420 K/uL    MPV 10.3 9.2 - 11.8 FL    NEUTROPHILS 56 40 - 73 %    LYMPHOCYTES 36 21 - 52 %    MONOCYTES 5 3 - 10 %    EOSINOPHILS 3 0 - 5 %    BASOPHILS 0 0 - 2 %    ABS. NEUTROPHILS 3.0 1.8 - 8.0 K/UL    ABS. LYMPHOCYTES 2.0 0.9 - 3.6 K/UL    ABS. MONOCYTES 0.3 0.05 - 1.2 K/UL    ABS. EOSINOPHILS 0.2 0.0 - 0.4 K/UL    ABS. BASOPHILS 0.0 0.0 - 0.06 K/UL    DF AUTOMATED     METABOLIC PANEL, COMPREHENSIVE    Collection Time: 02/26/15  8:20 PM   Result Value Ref Range    Sodium 143 136 - 145 mmol/L    Potassium 3.9 3.5 - 5.5 mmol/L    Chloride 109 (H) 100 - 108 mmol/L    CO2 28 21 - 32 mmol/L    Anion gap 6 3.0 - 18 mmol/L    Glucose 73 (L) 74 - 99 mg/dL    BUN 12 7.0 - 18 MG/DL    Creatinine 0.71 0.6 - 1.3 MG/DL     BUN/Creatinine ratio 17 12 - 20      GFR est AA >60 >60 ml/min/1.769m   GFR est non-AA >60 >60 ml/min/1.7324m  Calcium 8.7 8.5 - 10.1 MG/DL    Bilirubin, total 0.3 0.2 - 1.0 MG/DL    ALT 15 13 - 56 U/L    AST 22 15 - 37 U/L    Alk. phosphatase 82 45 - 117 U/L    Protein, total 6.6 6.4 - 8.2 g/dL    Albumin 3.5 3.4 - 5.0 g/dL    Globulin 3.1 2.0 - 4.0 g/dL    A-G Ratio 1.1 0.8 - 1.7  CARDIAC PANEL,(CK, CKMB & TROPONIN)    Collection Time: 02/26/15  8:20 PM   Result Value Ref Range    CK 152 26 - 192 U/L    CK - MB 1.0 0.5 - 3.6 ng/ml    CK-MB Index 0.7 0.0 - 4.0 %    Troponin-I, Qt. <0.02 0.0 - 0.045 NG/ML   MAGNESIUM    Collection Time: 02/26/15  8:20 PM   Result Value Ref Range    Magnesium 2.1 1.8 - 2.4 mg/dL   PROTHROMBIN TIME + INR    Collection Time: 02/26/15  8:20 PM   Result Value Ref Range    Prothrombin time 13.1 11.5 - 15.2 sec    INR 1.0 0.8 - 1.2     EKG, 12 LEAD, INITIAL    Collection Time: 02/26/15  8:35 PM   Result Value Ref Range    Ventricular Rate 76 BPM    Atrial Rate 76 BPM    P-R Interval 152 ms    QRS Duration 88 ms    Q-T Interval 368 ms    QTC Calculation (Bezet) 414 ms    Calculated P Axis 85 degrees    Calculated R Axis 82 degrees    Calculated T Axis 43 degrees    Diagnosis       Normal sinus rhythm  Normal ECG  When compared with ECG of 28-Apr-2011 21:07,  No significant change was found     URINALYSIS W/ RFLX MICROSCOPIC    Collection Time: 02/26/15  8:40 PM   Result Value Ref Range    Color YELLOW      Appearance CLEAR      Specific gravity 1.011 1.005 - 1.030      pH (UA) 6.0 5.0 - 8.0      Protein NEGATIVE  NEG mg/dL    Glucose NEGATIVE  NEG mg/dL    Ketone NEGATIVE  NEG mg/dL    Bilirubin NEGATIVE  NEG      Blood NEGATIVE  NEG      Urobilinogen 0.2 0.2 - 1.0 EU/dL    Nitrites NEGATIVE  NEG      Leukocyte Esterase NEGATIVE  NEG     CARDIAC PANEL,(CK, CKMB & TROPONIN)    Collection Time: 02/27/15  1:05 AM   Result Value Ref Range    CK 178 26 - 192 U/L     CK - MB 1.6 0.5 - 3.6 ng/ml    CK-MB Index 0.9 0.0 - 4.0 %    Troponin-I, Qt. <0.02 0.0 - 0.045 NG/ML   EKG, 12 LEAD, SUBSEQUENT    Collection Time: 02/27/15  1:25 AM   Result Value Ref Range    Ventricular Rate 88 BPM    Atrial Rate 88 BPM    P-R Interval 146 ms    QRS Duration 100 ms    Q-T Interval 360 ms    QTC Calculation (Bezet) 435 ms    Calculated P Axis 81 degrees    Calculated R Axis 70 degrees    Calculated T Axis -14 degrees    Diagnosis       Normal sinus rhythm  Abnormal QRS-T angle, consider primary T wave abnormality  Abnormal ECG  When compared with ECG of 26-Feb-2015 20:35,  Non-specific change in ST segment in Inferior leads  Nonspecific T wave abnormality, worse in Inferior leads

## 2015-02-26 NOTE — ED Notes (Signed)
Patient slipped on water on the floor.  Right hip pain and right face pain.

## 2015-02-27 ENCOUNTER — Emergency Department: Admit: 2015-02-27 | Payer: MEDICAID | Primary: Internal Medicine

## 2015-02-27 LAB — PROTHROMBIN TIME + INR
INR: 1 (ref 0.8–1.2)
Prothrombin time: 13.1 s (ref 11.5–15.2)

## 2015-02-27 LAB — URINALYSIS W/ RFLX MICROSCOPIC
Bilirubin: NEGATIVE
Blood: NEGATIVE
Glucose: NEGATIVE mg/dL
Ketone: NEGATIVE mg/dL
Leukocyte Esterase: NEGATIVE
Nitrites: NEGATIVE
Protein: NEGATIVE mg/dL
Specific gravity: 1.011 (ref 1.005–1.030)
Urobilinogen: 0.2 EU/dL (ref 0.2–1.0)
pH (UA): 6 (ref 5.0–8.0)

## 2015-02-27 LAB — METABOLIC PANEL, COMPREHENSIVE
A-G Ratio: 1.1 (ref 0.8–1.7)
ALT (SGPT): 15 U/L (ref 13–56)
AST (SGOT): 22 U/L (ref 15–37)
Albumin: 3.5 g/dL (ref 3.4–5.0)
Alk. phosphatase: 82 U/L (ref 45–117)
Anion gap: 6 mmol/L (ref 3.0–18)
BUN/Creatinine ratio: 17 (ref 12–20)
BUN: 12 MG/DL (ref 7.0–18)
Bilirubin, total: 0.3 MG/DL (ref 0.2–1.0)
CO2: 28 mmol/L (ref 21–32)
Calcium: 8.7 MG/DL (ref 8.5–10.1)
Chloride: 109 mmol/L — ABNORMAL HIGH (ref 100–108)
Creatinine: 0.71 MG/DL (ref 0.6–1.3)
GFR est AA: >60 mL/min/{1.73_m2} (ref >60)
GFR est non-AA: >60 mL/min/{1.73_m2} (ref >60)
Globulin: 3.1 g/dL (ref 2.0–4.0)
Glucose: 73 mg/dL — ABNORMAL LOW (ref 74–99)
Potassium: 3.9 mmol/L (ref 3.5–5.5)
Protein, total: 6.6 g/dL (ref 6.4–8.2)
Sodium: 143 mmol/L (ref 136–145)

## 2015-02-27 LAB — CARDIAC PANEL,(CK, CKMB & TROPONIN)
CK - MB: 1 ng/ml (ref 0.5–3.6)
CK - MB: 1.6 ng/ml (ref 0.5–3.6)
CK-MB Index: 0.7 % (ref 0.0–4.0)
CK-MB Index: 0.9 % (ref 0.0–4.0)
CK: 152 U/L (ref 26–192)
CK: 178 U/L (ref 26–192)
Troponin-I, QT: <0.02 NG/ML (ref 0.0–0.045)
Troponin-I, QT: <0.02 NG/ML (ref 0.0–0.045)

## 2015-02-27 LAB — CBC WITH AUTOMATED DIFF
ABS. BASOPHILS: 0 10*3/uL (ref 0.0–0.06)
ABS. EOSINOPHILS: 0.2 10*3/uL (ref 0.0–0.4)
ABS. LYMPHOCYTES: 2 10*3/uL (ref 0.9–3.6)
ABS. MONOCYTES: 0.3 10*3/uL (ref 0.05–1.2)
ABS. NEUTROPHILS: 3 10*3/uL (ref 1.8–8.0)
BASOPHILS: 0 % (ref 0–2)
EOSINOPHILS: 3 % (ref 0–5)
HCT: 32.5 % — ABNORMAL LOW (ref 35.0–45.0)
HGB: 10 g/dL — ABNORMAL LOW (ref 12.0–16.0)
LYMPHOCYTES: 36 % (ref 21–52)
MCH: 26.2 PG (ref 24.0–34.0)
MCHC: 30.8 g/dL — ABNORMAL LOW (ref 31.0–37.0)
MCV: 85.1 FL (ref 74.0–97.0)
MONOCYTES: 5 % (ref 3–10)
MPV: 10.3 FL (ref 9.2–11.8)
NEUTROPHILS: 56 % (ref 40–73)
PLATELET: 236 10*3/uL (ref 135–420)
RBC: 3.82 M/uL — ABNORMAL LOW (ref 4.20–5.30)
RDW: 23.5 % — ABNORMAL HIGH (ref 11.6–14.5)
WBC: 5.4 10*3/uL (ref 4.6–13.2)

## 2015-02-27 LAB — MAGNESIUM: Magnesium: 2.1 mg/dL (ref 1.8–2.4)

## 2015-02-27 MED ORDER — MORPHINE 4 MG/ML SYRINGE
4 mg/mL | INTRAMUSCULAR | Status: AC
Start: 2015-02-27 — End: 2015-02-26
  Administered 2015-02-27: 03:00:00 via INTRAVENOUS

## 2015-02-27 MED ORDER — MORPHINE 4 MG/ML SYRINGE
4 mg/mL | INTRAMUSCULAR | Status: AC
Start: 2015-02-27 — End: 2015-02-26
  Administered 2015-02-27: 01:00:00 via INTRAVENOUS

## 2015-02-27 MED FILL — MORPHINE 4 MG/ML SYRINGE: 4 mg/mL | INTRAMUSCULAR | Qty: 1

## 2015-02-27 NOTE — ED Notes (Signed)
Purposeful rounding completed:    Side rails up x 2:  YES  Bed low and wheels and locked: YES  Call bell in reach: YES  Comfort addressed: YES    Toileting needs addressed: YES  Plan of care reviewed/updated with patient and or family members: YES  IV site assessed: YES  Pain assessed and addressed: YES, 0

## 2015-02-27 NOTE — Progress Notes (Signed)
Quick Note:        Called and notified patient and advised to follow up with ENT Dr. Leana GamerKenerson at [phone]. She agrees. Freddrick MarchHeather L Diera Wirkkala, PA-C 11:49 AM                ______

## 2015-02-27 NOTE — ED Notes (Signed)
Purposeful rounding completed:    Side rails up x 2:  YES  Bed low and wheels and locked: YES  Call bell in reach: YES  Comfort addressed: YES    Toileting needs addressed: YES  Plan of care reviewed/updated with patient and or family members: YES  IV site assessed: YES  Pain assessed and addressed: YES, 0,

## 2015-02-27 NOTE — ED Notes (Signed)
Pt ambulate to restroom, denies any pain during ambulation, attempt to urinate but unsuccessful, pt tolerated po well, drink over fluid, denies N/V, will attempt to void shortly.

## 2015-02-27 NOTE — ED Notes (Signed)
I have reviewed discharge instructions with the patient.  The patient verbalized understanding.Patient discharged without removing armband.  Informed of privacy risks if armband lost or stolen

## 2015-02-27 NOTE — ED Notes (Signed)
Pt void 400 ml yellow clear urine at bedpan, unable to make to restroom due to restroom been used, denies any nausea/vomit, denies any distress at this time.

## 2015-02-28 LAB — EKG, 12 LEAD, SUBSEQUENT
Atrial Rate: 88 {beats}/min
Calculated P Axis: 81 degrees
Calculated R Axis: 70 degrees
Calculated T Axis: -14 degrees
Diagnosis: NORMAL
P-R Interval: 146 ms
Q-T Interval: 360 ms
QRS Duration: 100 ms
QTC Calculation (Bezet): 435 ms
Ventricular Rate: 88 {beats}/min

## 2015-02-28 LAB — EKG, 12 LEAD, INITIAL
Atrial Rate: 76 {beats}/min
Calculated P Axis: 85 degrees
Calculated R Axis: 82 degrees
Calculated T Axis: 43 degrees
Diagnosis: NORMAL
P-R Interval: 152 ms
Q-T Interval: 368 ms
QRS Duration: 88 ms
QTC Calculation (Bezet): 414 ms
Ventricular Rate: 76 {beats}/min

## 2015-04-06 ENCOUNTER — Inpatient Hospital Stay
Admit: 2015-04-06 | Discharge: 2015-04-06 | Disposition: A | Discharge status: Home / Self Care | Payer: MEDICAID | Attending: Emergency Medicine

## 2015-04-06 ENCOUNTER — Emergency Department: Admit: 2015-04-06 | Payer: MEDICAID | Primary: Internal Medicine

## 2015-04-06 ENCOUNTER — Emergency Department: Payer: MEDICAID | Primary: Internal Medicine

## 2015-04-06 DIAGNOSIS — S060X0A Concussion without loss of consciousness, initial encounter: Secondary | ICD-10-CM

## 2015-04-06 LAB — METABOLIC PANEL, COMPREHENSIVE
ALT (SGPT): 26 U/L (ref 12–78)
AST (SGOT): 65 U/L — ABNORMAL HIGH (ref 15–37)
Albumin: 3.3 gm/dl — ABNORMAL LOW (ref 3.4–5.0)
Alk. phosphatase: 93 U/L (ref 45–117)
BUN: 9 mg/dl (ref 7–25)
Bilirubin, total: 0.3 mg/dl (ref 0.2–1.0)
CO2: 27 mEq/L (ref 21–32)
Calcium: 8.1 mg/dl — ABNORMAL LOW (ref 8.5–10.1)
Chloride: 113 mEq/L — ABNORMAL HIGH (ref 98–107)
Creatinine: 0.7 mg/dl (ref 0.6–1.3)
GFR est AA: >60
GFR est non-AA: >60
Glucose: 88 mg/dl (ref 74–106)
Potassium: 3.7 mEq/L (ref 3.5–5.1)
Protein, total: 6.4 gm/dl (ref 6.4–8.2)
Sodium: 147 mEq/L — ABNORMAL HIGH (ref 136–145)

## 2015-04-06 LAB — CREATININE, POC: Creatinine: 0.7 mg/dl (ref 0.6–1.3)

## 2015-04-06 LAB — CBC WITH AUTOMATED DIFF
BASOPHILS: 1.1 % (ref 0–3)
EOSINOPHILS: 3.8 % (ref 0–5)
HCT: 31.5 % — ABNORMAL LOW (ref 37.0–50.0)
HGB: 9.6 gm/dl — ABNORMAL LOW (ref 13.0–17.2)
IMMATURE GRANULOCYTES: 0.2 % (ref 0.0–3.0)
LYMPHOCYTES: 30.9 % (ref 28–48)
MCH: 26.9 pg (ref 25.4–34.6)
MCHC: 30.5 gm/dl (ref 30.0–36.0)
MCV: 88.2 fL (ref 80.0–98.0)
MONOCYTES: 6.1 % (ref 1–13)
MPV: 9.3 fL (ref 6.0–10.0)
NEUTROPHILS: 57.9 % (ref 34–64)
NRBC: 0 (ref 0–0)
PLATELET: 191 10*3/uL (ref 140–450)
RBC: 3.57 M/uL — ABNORMAL LOW (ref 3.60–5.20)
RDW-SD: 55.2 — ABNORMAL HIGH (ref 36.4–46.3)
WBC: 4.7 10*3/uL (ref 4.0–11.0)

## 2015-04-06 LAB — LIPASE: Lipase: 440 U/L — ABNORMAL HIGH (ref 73–393)

## 2015-04-06 LAB — PROTHROMBIN TIME + INR
INR: 1.2 — ABNORMAL HIGH (ref 0.1–1.1)
Prothrombin time: 14.5 seconds — ABNORMAL HIGH (ref 11.5–14.0)

## 2015-04-06 LAB — GLUCOSE, POC: Glucose (POC): 63 mg/dL — ABNORMAL LOW (ref 65–105)

## 2015-04-06 MED ORDER — MORPHINE 4 MG/ML SYRINGE
4 mg/mL | INTRAMUSCULAR | Status: AC
Start: 2015-04-06 — End: 2015-04-06
  Administered 2015-04-06: 15:00:00 via INTRAVENOUS

## 2015-04-06 MED ORDER — SODIUM CHLORIDE 0.9 % IJ SYRG
Freq: Once | INTRAMUSCULAR | Status: AC
Start: 2015-04-06 — End: 2015-04-06
  Administered 2015-04-06: 13:00:00 via INTRAVENOUS

## 2015-04-06 MED ORDER — IOPAMIDOL 76 % IV SOLN
370 mg iodine /mL (76 %) | Freq: Once | INTRAVENOUS | Status: AC
Start: 2015-04-06 — End: 2015-04-06
  Administered 2015-04-06: 14:00:00 via INTRAVENOUS

## 2015-04-06 MED ORDER — SODIUM CHLORIDE 0.9 % IJ SYRG
Freq: Once | INTRAMUSCULAR | Status: AC
Start: 2015-04-06 — End: 2015-04-06
  Administered 2015-04-06: 14:00:00 via INTRAVENOUS

## 2015-04-06 MED FILL — MORPHINE 4 MG/ML SYRINGE: 4 mg/mL | INTRAMUSCULAR | Qty: 2

## 2015-04-06 MED FILL — ISOVUE-370  76 % INTRAVENOUS SOLUTION: 370 mg iodine /mL (76 %) | INTRAVENOUS | Qty: 80

## 2015-04-06 NOTE — ED Provider Notes (Signed)
[facility]  EMERGENCY DEPARTMENT TREATMENT REPORT  NAME:  [name] [name], [name] [name]  SEX:   F  ADMIT: 04/06/2015  DOB:   [birth year]  MR#    [id number]  ROOM:  [id number]  TIME DICTATED: 01 16 PM  ACCT#  [id number]    cc: Abbey ChattersANIEL BLUESTEIN MD    FAMILY DOCTOR:  Dr. Laverle PatterBluestein      CHIEF COMPLAINT:  Fall.    HISTORY OF PRESENT ILLNESS:  The patient is a 64 year old female.  She presents to the ER today after she   reports that she was getting out of bed last night to use that bathroom.  She   states that she has a very high bed and she was going down her stairs off her   bed, when she fell backwards striking the back of her head.  The patient   reports that she took her Roxicodone, that she used the bathroom and back to   bed.  This morning when she woke up, she reports that she has a headache and   some mild neck pain.  She also reports that she is having a lot of left   lateral left-sided rib pain.  The patient denies any chest pain, denies any   shortness of breath, states that she is able to move her arms and legs without   any difficulty and has no acute pain.  The patient reports that she is on   Coumadin, states that she has a history of chronic pain, which is what she   takes the Roxicodone for on a regular basis.      REVIEW OF SYSTEMS:  CONSTITUTIONAL:   No reported fevers.  EYES:  No visual symptoms.   ENT:  No sore throat, runny nose, or other URI symptoms.   RESPIRATORY:  No cough, shortness of breath or wheezing.  CARDIOVASCULAR:  No chest pain, chest pressure, or palpitations.   GASTROINTESTINAL:  No vomiting, no diarrhea.  The patient reports left-sided   upper abdominal pain in the rib area.  GENITOURINARY:  No dysuria, frequency, or urgency.  MUSCULOSKELETAL:  No joint pain or swelling.  The patient reports neck pain.   INTEGUMENTARY:  No rashes.  NEUROLOGICAL:  The patient reports a headache.  No sensory or motor symptoms.        PAST MEDICAL HISTORY:   Includes seizures, CVA, anxiety, depressed mood, chronic pain syndrome,   fibromyalgia, hemiparesis on the right side, hyperlipidemia, irregular   sleep-wake rhythm, knee joint pain, muscle spasm, osteoporosis, subclinical   hypothyroidism, seizures, autoimmune disease, pancreatitis, coronary artery   disease, gastric ulcer, diabetes, anemia, vitamin D deficiency, hypertension,   GI bleed, chronic low back pain.  The patient also has a history of DVT, PE,   chest pain and respiratory failure.    SURGICAL HISTORY:  Includes a hysterectomy, cholecystectomy, and EGD.    SOCIAL HISTORY:  The patient is a former smoker.  Denies any alcohol or illegal drug use.    ALLERGIES:  INCLUDE ASPIRIN, OPANA AND TYLENOL.    HOME MEDICATIONS:  Include albuterol, DuoNeb, vitamin D3, Elavil, ferrous  sulfate, Prozac,   Plaquenil, Lamictal, oxycodone and Coumadin.    PHYSICAL EXAMINATION:  VITAL SIGNS:  Temperature is 97.8, respirations 18, blood pressure is 139/83,   O2 sat 100% on room air.  Pain is 8 out of 10.  GENERAL APPEARANCE:  Patient appears well developed and well nourished.    Appearance and behavior are age and situation appropriate.  HEENT:  Eyes:  Conjunctivae clear, lids normal.  Pupils equal, symmetrical,   and normally reactive.  Ears and nose:  Hearing is grossly intact to voice.  External examination of   the ears and nose are unremarkable.    LYMPHATIC:  No cervical or submandibular lymphadenopathy palpated.  RESPIRATORY:  Clear and equal breath sounds.  No respiratory distress,   tachypnea, or accessory muscle use.    CARDIOVASCULAR:  Heart regular, without murmurs, gallops, rubs or thrills.     Chest symmetrical without masses or tenderness.   No peripheral edema or   significant varicosities.   DP pulses 2+ and equal bilaterally.   ABDOMEN:  Soft.  The patient has tenderness in the left upper quadrant with   palpation into the rib area.  No hepatosplenomegaly noted.  There is no    crepitus no abdominal masses appreciated by inspection or palpation.  SKIN:  Warm and dry without rashes.  NEUROLOGIC:  Alert, oriented.  Sensation intact, motor strength equal and   symmetric.  Cranial nerves II-XII are intact.  There is no facial asymmetry or dysarthria.        CONTINUATION BY Vinnie Langton, MD:    INITIAL ASSESSMENT AND MANAGEMENT PLAN:    The patient presents here with having fallen yesterday and hit her head.  The   patient is on Coumadin.  The patient will be evaluated for intracranial   hemorrhaging as the patient has a mild headache.    LABORATORY TESTS:  Fortunately, Coumadin level is low at 1.2.  Electrolytes essentially normal   for her.  White count 4.7, hematocrit 31, hemoglobin 9.  CT of the head was   negative for intracranial hemorrhaging, no fracture seen.  CT of the neck was   negative.    COURSE IN EMERGENCY DEPARTMENT:  The patient was noted to also have pain in the ribs.  X-rays were obtained of   the ribs which showed no fractures.  She had pain in the left upper quadrant.    CT scan of the abdomen and pelvis was negative also.  It was noted, though,   that the patient had elevated lipase at 440 and history of pancreatitis   several years ago.  CT of the abdomen and pelvis had incidentally shown the   patient to have extensive intrahepatic and extrahepatic biliary dilation   post-cholecystectomy, common bile duct 2.2 mm.  It was noted that also there   was mild pancreatic ductal dilatation.  Ultrasound was performed and showed   fatty liver, cholecystectomy without biliary dilatation and no other   abnormality.    COURSE IN EMERGENCY DEPARTMENT:  The patient was comfortable, ate a sandwich.  I placed her on clear liquids,   though, to help her lipase come down for today.  She had the above testing   done.  She remained fully alert and was discharged after given morphine for   pain, and she will remain on clear liquids for 24 hours.    CLINICAL IMPRESSION:     1.  Concussion without loss of consciousness, initial encounter.  2.  Idiopathic acute pancreatitis.    DISPOSITION:    As above.  The patient will restart her Coumadin as she says now that she   forgot to take it for a few days.  Has already pain medicine at home,   oxycodone.      ___________________  Smitty Cords MD  Dictated By: Eliot Ford, NP  My signature above authenticates this document and my orders, the final   diagnosis (es), discharge prescription (s), and instructions in the Epic   record.  If you have any questions please contact [phone].    Nursing notes have been reviewed by the physician/ advanced practice   clinician.    BB  D:04/06/2015 13:16:36  T: 04/06/2015 14:43:32  [id number]

## 2015-04-06 NOTE — ED Notes (Signed)
Pt states she fell last night between 1100pm-1200pm, states she hit her head and her left side hit door.  States pain was increased this morning so she called rescue.    Rescue admin 4 mg Zofran and 2 mg Morphine en route to ED.

## 2015-04-06 NOTE — ED Notes (Signed)
I have reviewed discharge instructions with the patient.  The patient verbalized understanding.

## 2015-04-10 MED FILL — MORPHINE 10 MG/ML INJ SOLUTION: 10 mg/ml | INTRAMUSCULAR | Qty: 2

## 2015-05-06 ENCOUNTER — Inpatient Hospital Stay
Admit: 2015-05-06 | Discharge: 2015-05-06 | Disposition: A | Discharge status: Home / Self Care | Payer: MEDICAID | Attending: Emergency Medical Services

## 2015-05-06 DIAGNOSIS — T63301A Toxic effect of unspecified spider venom, accidental (unintentional), initial encounter: Secondary | ICD-10-CM

## 2015-05-06 LAB — PROTHROMBIN TIME + INR
INR: 1.7 — ABNORMAL HIGH (ref 0.1–1.1)
Prothrombin time: 19.3 seconds — ABNORMAL HIGH (ref 11.5–14.0)

## 2015-05-06 MED ORDER — DIPHENHYDRAMINE 25 MG CAP
25 mg | ORAL | Status: AC
Start: 2015-05-06 — End: 2015-05-06
  Administered 2015-05-06: 22:00:00 via ORAL

## 2015-05-06 MED FILL — DIPHENHYDRAMINE 25 MG CAP: 25 mg | ORAL | Qty: 1

## 2015-05-06 NOTE — ED Triage Notes (Signed)
Pt reports that she had a spider bite to right big toe.  Pt reports pain in right big toe

## 2015-05-06 NOTE — ED Triage Notes (Signed)
Spider bite to right foot.

## 2015-05-06 NOTE — ED Provider Notes (Signed)
[facility]  EMERGENCY DEPARTMENT TREATMENT REPORT  NAME:  [name], [name]  SEX:   F  ADMIT: 05/06/2015  DOB:   [birth year]  MR#    [id number]  ROOM:  [id number]  TIME DICTATED: 06 40 PM  ACCT#  [id number]    cc: Abbey Chatters MD    PRIMARY CARE PHYSICIAN:  Abbey Chatters, MD    CHIEF COMPLAINT:  Insect bite.    HISTORY OF PRESENT ILLNESS:  The patient is a very pleasant 64 year old female.  She presents to the ER   today stating that she was outside in the yard when she saw a spider on her   foot.  She felt intense pain and realized that the spider was biting her.  The   patient reports that she was concerned that she may have been bitten by a   poisonous spider, which is what brings her in today.  The patient denies any   redness to the area or swelling.  The patient's 2nd concern today is that she   has relocated from [facility] and that she is having difficulty getting to her   doctor.  She states she has some bilateral lower extremity bruising and she   has not had her Coumadin level checked in quite some time.  She believes it   might be more than a month.  The patient denies any bleeding gums, chest pain,   or shortness of breath today.      REVIEW OF SYSTEMS:      CONSTITUTIONAL:  No fever, chills, or weight loss.   ENT:  No sore throat, runny nose, or other URI symptoms.   RESPIRATORY:  No cough, shortness of breath, or wheezing.   CARDIOVASCULAR:  No chest pain, chest pressure, or palpitations.   GASTROINTESTINAL:  No vomiting, diarrhea, or abdominal pain.   GENITOURINARY:  No dysuria, frequency, or urgency.   MUSCULOSKELETAL:  The patient reports pain in her left foot where the spider   bite occurred.  No swelling or skin discoloration.  SKIN:  The patient reports bilateral lower extremity sporadic bruising.    NEUROLOGICAL:  No headaches, sensory or motor symptoms.      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    PAST SURGICAL HISTORY:  Hysterectomy, cholecystectomy, gastric resection.  The patient denies any   active smoking, alcohol or drug use.    ALLERGIES:  ASPIRIN,  OPANA, AND TYLENOL.      HOME MEDICATIONS:    Reviewed in Epic.      PHYSICAL EXAMINATION:   VITAL SIGNS:  Temperature 98, pulse is 80, respirations 18, blood pressure   154/97, O2 sats 100% on room air.  Calves are soft and nontender.  GENERAL APPEARANCE:  Patient appears well developed and well nourished.    Appearance and behavior are age and situation appropriate.    RESPIRATORY:  Clear and equal breath sounds.  No respiratory distress,   tachypnea, or accessory muscle use.   CARDIOVASCULAR:  Heart regular without murmurs, gallops, rubs, or thrills.     DP pulses 2+ and equal bilaterally.   DP pulses 2+ and equal bilaterally.  Calves are soft and nontender.    SKIN:  The patient has no obvious lesions noted to her foot where the insect   bite occurred.  She does have some mild lower extremity bruising that is   sporadic.    NEUROLOGIC:  Alert, oriented.  Sensation intact, motor strength equal and   symmetric.  There is no facial asymmetry or dysarthria.    PSYCHIATRIC:  Oriented to time, place, and person.  Mood and affect   appropriate.      INITIAL ASSESSMENT AND TREATMENT PLAN:    Today, the patient is being evaluated for her insect bite.  At this time, I do   not think the patient requires any intervention.  She has no marks and no   noted lesion to the area.  I do not even see any sign of a localized reaction.    I recommend supportive care and Benadryl if she develops any itching.  I am    going to check the patient's PT/INR today since she has not had her Coumadin   level checked in about a month.  Otherwise, I do not see any reason to do any   further diagnostic studying.  The patient is very well appearing and has no   other symptoms today.    DIAGNOSTIC STUDIES:  The patient's PT/INR was 1.7 and 19.3.    EMERGENCY DEPARTMENT COURSE:  While the patient was here, she did receive a Benadryl for itching on her   foot.  She continued to remain otherwise asymptomatic.  She developed no   respiratory symptoms and no localized reaction to her foot where this   occurred.  I think the patient is able to be safely discharged home and I do   recommend she follow up with her primary care doctor for recommendations for   her Coumadin therapy.    DIAGNOSES:      1.  Insect bite.  2.  Lower extremity ecchymosis.    DISPOSITION:  The patient will be discharged home, not receiving any prescriptions.    The patient was personally evaluated by myself and Dr. Damien Fusi, who   agrees with the above assessment and plan.      ___________________  Elsie Saas MD  Dictated By: Eliot Ford, NP    My signature above authenticates this document and my orders, the final   diagnosis (es), discharge prescription (s), and instructions in the Epic   record.  If you have any questions please contact [phone].    Nursing notes have been reviewed by the physician/ advanced practice   clinician.    KB  D:05/06/2015 18:40:48  T: 05/07/2015 00:50:34  [id number]

## 2015-05-06 NOTE — ED Notes (Signed)
I have reviewed discharge instructions with the patient.  The patient verbalized understanding.

## 2015-05-19 ENCOUNTER — Emergency Department: Admit: 2015-05-19 | Payer: MEDICAID | Primary: Internal Medicine

## 2015-05-19 ENCOUNTER — Inpatient Hospital Stay
Admit: 2015-05-19 | Discharge: 2015-05-20 | Disposition: A | Discharge status: Home / Self Care | Payer: MEDICAID | Attending: Emergency Medicine

## 2015-05-19 DIAGNOSIS — R0789 Other chest pain: Secondary | ICD-10-CM

## 2015-05-19 LAB — CBC WITH AUTOMATED DIFF
BASOPHILS: 0.6 % (ref 0–3)
EOSINOPHILS: 3.4 % (ref 0–5)
HCT: 34.3 % — ABNORMAL LOW (ref 37.0–50.0)
HGB: 10.5 gm/dl — ABNORMAL LOW (ref 13.0–17.2)
IMMATURE GRANULOCYTES: 0.2 % (ref 0.0–3.0)
LYMPHOCYTES: 28 % (ref 28–48)
MCH: 26.4 pg (ref 25.4–34.6)
MCHC: 30.6 gm/dl (ref 30.0–36.0)
MCV: 86.2 fL (ref 80.0–98.0)
MONOCYTES: 7.5 % (ref 1–13)
MPV: 10.8 fL — ABNORMAL HIGH (ref 6.0–10.0)
NEUTROPHILS: 60.3 % (ref 34–64)
NRBC: 0 (ref 0–0)
PLATELET: 238 10*3/uL (ref 140–450)
RBC: 3.98 M/uL (ref 3.60–5.20)
RDW-SD: 55.4 — ABNORMAL HIGH (ref 36.4–46.3)
WBC: 5 10*3/uL (ref 4.0–11.0)

## 2015-05-19 LAB — METABOLIC PANEL, BASIC
BUN: 10 mg/dl (ref 7–25)
CO2: 30 mEq/L (ref 21–32)
Calcium: 8.6 mg/dl (ref 8.5–10.1)
Chloride: 113 mEq/L — ABNORMAL HIGH (ref 98–107)
Creatinine: 0.8 mg/dl (ref 0.6–1.3)
GFR est AA: >60
GFR est non-AA: >60
Glucose: 83 mg/dl (ref 74–106)
Potassium: 3.5 mEq/L (ref 3.5–5.1)
Sodium: 146 mEq/L — ABNORMAL HIGH (ref 136–145)

## 2015-05-19 LAB — PROTHROMBIN TIME + INR
INR: 1.1 (ref 0.1–1.1)
Prothrombin time: 13.6 seconds (ref 11.5–14.0)

## 2015-05-19 LAB — TROPONIN I: Troponin-I: 0.016 ng/ml (ref 0.00–0.09)

## 2015-05-19 MED ORDER — DIPHENHYDRAMINE HCL 50 MG/ML IJ SOLN
50 mg/mL | INTRAMUSCULAR | Status: AC
Start: 2015-05-19 — End: 2015-05-19
  Administered 2015-05-19: 21:00:00 via INTRAVENOUS

## 2015-05-19 MED ORDER — METHYLPREDNISOLONE (PF) 125 MG/2 ML IJ SOLR
125 mg/2 mL | INTRAMUSCULAR | Status: AC
Start: 2015-05-19 — End: 2015-05-19
  Administered 2015-05-19: 21:00:00 via INTRAVENOUS

## 2015-05-19 MED ORDER — FAMOTIDINE (PF) 20 MG/2 ML IV
20 mg/2 mL | INTRAVENOUS | Status: AC
Start: 2015-05-19 — End: 2015-05-19
  Administered 2015-05-19: 21:00:00 via INTRAVENOUS

## 2015-05-19 MED ORDER — IOPAMIDOL 76 % IV SOLN
370 mg iodine /mL (76 %) | Freq: Once | INTRAVENOUS | Status: AC
Start: 2015-05-19 — End: 2015-05-19
  Administered 2015-05-19: 23:00:00 via INTRAVENOUS

## 2015-05-19 MED ORDER — ONDANSETRON (PF) 4 MG/2 ML INJECTION
4 mg/2 mL | INTRAMUSCULAR | Status: AC
Start: 2015-05-19 — End: 2015-05-19
  Administered 2015-05-19: 21:00:00

## 2015-05-19 MED ORDER — FAMOTIDINE 20 MG TAB
20 mg | ORAL | Status: DC
Start: 2015-05-19 — End: 2015-05-19

## 2015-05-19 MED ORDER — SODIUM CHLORIDE 0.9 % IJ SYRG
Freq: Once | INTRAMUSCULAR | Status: AC
Start: 2015-05-19 — End: 2015-05-19
  Administered 2015-05-19: 23:00:00 via INTRAVENOUS

## 2015-05-19 MED ORDER — SODIUM CHLORIDE 0.9 % IJ SYRG
Freq: Once | INTRAMUSCULAR | Status: AC
Start: 2015-05-19 — End: 2015-05-19
  Administered 2015-05-19: 20:00:00 via INTRAVENOUS

## 2015-05-19 MED FILL — SOLU-MEDROL (PF) 125 MG/2 ML SOLUTION FOR INJECTION: 125 mg/2 mL | INTRAMUSCULAR | Qty: 2

## 2015-05-19 MED FILL — ISOVUE-370  76 % INTRAVENOUS SOLUTION: 370 mg iodine /mL (76 %) | INTRAVENOUS | Qty: 90

## 2015-05-19 MED FILL — DIPHENHYDRAMINE HCL 50 MG/ML IJ SOLN: 50 mg/mL | INTRAMUSCULAR | Qty: 1

## 2015-05-19 MED FILL — FAMOTIDINE (PF) 20 MG/2 ML IV: 20 mg/2 mL | INTRAVENOUS | Qty: 2

## 2015-05-19 MED FILL — FAMOTIDINE 20 MG TAB: 20 mg | ORAL | Qty: 1

## 2015-05-19 MED FILL — ONDANSETRON (PF) 4 MG/2 ML INJECTION: 4 mg/2 mL | INTRAMUSCULAR | Qty: 2

## 2015-05-19 NOTE — Progress Notes (Signed)
[facility] Dosing Services:     Consult for Warfarin Dosing by Pharmacy by Dr. Langston Masker  Consult provided for this 64 y.o.  female , for indication of thrombosis  Dose to achieve an INR goal of 2-3    Order entered for  Warfarin  10 (mg) ordered to be given now    Significant drug interactions:   Previous dose given Warfarin 10 mg po daily   PT/INR Lab Results   Component Value Date/Time    INR 1.1 05/19/2015 05:12 PM      Platelets Lab Results   Component Value Date/Time    PLATELET 238 05/19/2015 05:12 PM      H/H Lab Results   Component Value Date/Time    HGB 10.5 05/19/2015 05:12 PM          Bridged with Lovenox 60 mg SubQ q12h    Pharmacy to follow daily and will provide subsequent Warfarin dosing based on clinical status.  JENNIFER Ned Card, PHARMD)  Contact information [phone]

## 2015-05-19 NOTE — Progress Notes (Signed)
Pt. Placed on tele. Tele tech [name] [name] called . Box [id number]. SR  [name] [name], [name] [name] MRN# [id number]

## 2015-05-19 NOTE — ED Triage Notes (Signed)
Per EMS picked patient up at home, patient c/o extreme itching.

## 2015-05-19 NOTE — H&P (Signed)
History and Physical      Date of Service:  05/19/15    Patient: [name] [name]               Sex: female          DOA: 05/19/2015         Date of Birth:  08-10-1951      Age:  64 y.o.        LOS:  LOS: 0 days        HPI:     [name] [name] is a 63 y.o. female with hx of HTN, CAD s/p stent placement, prior CVA, PE and   DVT on coumadin therapy, and DM II who presents with c/o Chest Pain, Precordial which occurred  While at home.  Pt also complains of diffuse itching which has persisted over the past 2 days.   Pt states that her CP started while she was scratching herself due to itching.  Pt reports taking SL NTG  X 2 with subsequent resolution of CP.  Denies fever or chills.  No nausea, vomiting or diaphoresis.  No new medications or environmental exposures.  No SOB or difficulty swallowing.  Pt states that she   Has been compliant with taking her coumadin and her other medications.        Past Medical History   Diagnosis Date   ??? Acute hypercapnic respiratory failure (HCC) 02/05/2013     W/ Encephalopathy requiring BiPAP, Etiology Uncertain however Possible Narcotic Benzodiazipine Related?    ??? Adjustment disorder with mixed anxiety and depressed mood    ??? Aneurysm of internal carotid artery    ??? Arthritis of knee    ??? Asthma    ??? Autoimmune disease (HCC)    ??? CAD (coronary artery disease)    ??? Cervicalgia    ??? Chest pain 03/17/2014     Dobutamine Stress Echo: NORMAL WALL MOTION AND GLOBAL SYSTOLIC FUNCTION AT REST AND AFTER?? STRESS/EXERCISE WITH APPROPRIATE AUGMENTATION OF FUNCTION IN ALL SEGMENTS. NO EVIDENCE OF INDUCIBLE ISCHEMIA AT ADEQUATE HEART RATE WITH DOBUTAMINE STRESS.??            ??? Chronic low back pain 10/22/2014     [facility] MRI Lumbar w/o contrast: Moderate facet arthropathy at L4-L5 and L5-S1. Mild/moderate foraminal stenosis at these levels. Disc bulge with annular tear at L4-L5.    ??? Chronic pain syndrome    ??? Coccydynia    ??? Diabetes mellitus      NIDDM    ??? DVT (deep venous thrombosis) (HCC) 05/19/2007     [facility] CTA: 1. Small nonocclusive pulmonary embolus in a subsegmental branch to the left lower lobe. Larger filling defect in a segmental branch to the right lower lobe, not as low in attenuation as is normally seen with a pulmonary embolus, however is seen in multiple planes and remains concerning for an additional nonocclusive pulmonary embolus.   ??? Fibromyalgia    ??? Gastric ulcer    ??? Gastritis 01/03/2015     [facility] Admission, EGD + H. Pylori/Gastritis   ??? Generalized osteoarthritis of multiple sites    ??? GI bleed 01/01/2015     [facility] Admit 5/2-01/07/2015   ??? H. pylori infection 01/03/2015     [facility] Admission, EGD + H. Pylori/Gastritis   ??? Hemiparesis, right (HCC)    ??? Hyperlipidemia    ??? Hypertension    ??? Irregular sleep-wake rhythm    ??? Knee joint pain    ??? Lumbar spondylosis  Orthopedic Spine Surgeon: Dr. Ree Kida L. Siegel    ??? Lupus (HCC)    ??? Muscle spasm    ??? Normocytic anemia      Chronic Iron Deficiency Anemia   ??? Osteoporosis    ??? Pancreatitis    ??? Peripheral neuropathy    ??? Pulmonary embolism (HCC)      Multiple   ??? Seizure (HCC)    ??? Seizures (HCC)    ??? Sensory ataxia    ??? SLE (systemic lupus erythematosus) (HCC)    ??? Stroke [facility])    ??? Subclinical hypothyroidism    ??? Vitamin D deficiency        Past Surgical History   Procedure Laterality Date   ??? Hx hysterectomy     ??? Hx cholecystectomy     ??? Pr cardiac surg procedure unlist       Cardiac Cath PCI w/ Stents   ??? Hx gi       Partial Gastrectomy    ??? Hx gi  01/03/2015     [facility] EGD by Dr. Smitty Cords D. Waldholtz: S/P B-2 Gastric Resection, Gastritis in small remnant, Bx of Jejunum r/o Celiac Disease and of Stomach. + Gastritis + H. Pylori       Family History   Problem Relation Age of Onset   ??? Diabetes Maternal Grandmother        Social History     Social History   ??? Marital status: DIVORCED     Spouse name: N/A   ??? Number of children: N/A   ??? Years of education: N/A     Social History Main Topics    ??? Smoking status: Former Smoker   ??? Smokeless tobacco: Never Used   ??? Alcohol use No   ??? Drug use: No   ??? Sexual activity: Not Asked     Other Topics Concern   ??? None     Social History Narrative       Prior to Admission medications    Medication Sig Start Date End Date Taking? Authorizing Provider   FLUoxetine (PROZAC) 40 mg capsule Take 1 Cap by mouth daily. 01/07/15   Theodis Aguas, MD   lamoTRIgine (LAMICTAL) 100 mg tablet Take 1 Tab by mouth two (2) times a day. 01/07/15   Theodis Aguas, MD   oxyCODONE IR (OXY-IR) 15 mg immediate release tablet Take 15 mg by mouth four (4) times daily.    Historical Provider   ferrous gluconate 324 mg (36 mg iron) tab Take 1 Tab by mouth Before breakfast and dinner. 11/29/14   Kristie Cowman, MD   hydroxychloroquine (PLAQUENIL) 200 mg tablet Take 200 mg by mouth two (2) times a day.    Phys Other, MD   alendronate-vitamin d3 70-2,800 mg-unit per tablet Take 1 Tab by mouth every seven (7) days.    Phys Other, MD   amitriptyline (ELAVIL) 75 mg tablet Take 1 Tab by mouth nightly.  Patient taking differently: Take 50 mg by mouth nightly. 03/29/12   Sunshine J McWhinney, PA-C   albuterol (PROVENTIL HFA) 90 mcg/Actuation inhaler Take 1 Puff by inhalation as needed.    Historical Provider   albuterol-ipratropium (DUO-NEB) 0.5 mg-3 mg(2.5 mg base)/3 mL nebulizer solution 3 mL by Nebulization route once.    Historical Provider   warfarin (COUMADIN) 5 mg tablet Take 5 mg by mouth daily. Take 2 tablets ( )    Historical Provider   Cholecalciferol, Vitamin D3, (VITAMIN D) 1,000 unit Cap Take  50,000 Units by mouth every seven (7) days.    Historical Provider       Allergies   Allergen Reactions   ??? Aspirin Not Reported This Time     Because of hx ulcers per patient   ??? Opana [Oxymorphone] Rash and Itching   ??? Tylenol [Acetaminophen] Itching       Review of Systems  CONSTITUTIONAL: No Fever, No chills, Pos hx of weight loss, No Night sweats   HEENT: No nasal discharge, No PN drainage, No epistaxis, No diff in swallowing  CVS: Pos chest pain, No palpitations, No syncope, No peripheral edema, No PND, No orthopnea  RS: No shortness of breath, No cough, No hemoptysis, No pleuritic chest pain  GI: No abd pain, No vomitting, No diarrhea, No hematemesis, No rectal bleeding, No acid reflux or heartburn  NEURO: No focal weakness, No headaches, No seizures  PSYCH: No anxiety, No depression  MUSCULOSKLETAL: No joint pain or swelling  GU: No hematuria or dysuria  SKIN: No rash, pos itching   SLEEP: No snoring, No witnessed apneas, No daytime somnolence or tiredness      Physical Exam:      Visit Vitals   ??? BP 119/70 (BP 1 Location: Right arm, BP Patient Position: Lying left side;At rest)   ??? Pulse 63   ??? Temp 98.1 ??F (36.7 ??C)   ??? Resp 18   ??? Ht  (1.702 m)   ??? Wt 61.8 kg (136 lb 3.9 oz)   ??? SpO2 99%   ??? BMI 21.34 kg/m2     No intake or output data in the 24 hours ending 05/19/15 2237      Physical Exam:    HEENT: NC/AT, PERRLA, EOMI, dry mm, no oropharyngeal edema  NECK: No lymphadenopthy or thyroid swelling, JVD not seen, no bruits,   Supple, NT   LYMPH: No supraclavicular or cervical or axillary nodes on both sides  CVS: S1S2, No murmurs, P2 not prominent, No gallop or rub  RS: Bilateral Air Entry moderate, No wheezing or crackles  Abd: Soft, non tender, not distended, No guarding, No rigidity, BS normal  NEURO:  Non focal, normal strength.  CN II - XII intact bilaterally   Extrm: no leg edema or leg swelling or clubbing bilateral  Skin: No frank rash, pos excoriation from scratching without secondary infection    Labs Reviewed:    BMP:   Lab Results   Component Value Date/Time    NA 142 05/20/2015 05:11 AM    K 4.0 05/20/2015 05:11 AM    CL 108 (H) 05/20/2015 05:11 AM    CO2 26 05/20/2015 05:11 AM    GLU 127 (H) 05/20/2015 05:11 AM    BUN 10 05/20/2015 05:11 AM    CREA 0.6 05/20/2015 05:11 AM    GFRAA >60.0 05/20/2015 05:11 AM     GFRNA >60 05/20/2015 05:11 AM     CMP:   Lab Results   Component Value Date/Time    NA 142 05/20/2015 05:11 AM    K 4.0 05/20/2015 05:11 AM    CL 108 (H) 05/20/2015 05:11 AM    CO2 26 05/20/2015 05:11 AM    GLU 127 (H) 05/20/2015 05:11 AM    BUN 10 05/20/2015 05:11 AM    CREA 0.6 05/20/2015 05:11 AM    GFRAA >60.0 05/20/2015 05:11 AM    GFRNA >60 05/20/2015 05:11 AM    CA 8.9 05/20/2015 05:11 AM    ALB 3.4 05/20/2015  05:11 AM    TP 6.8 05/20/2015 05:11 AM    SGOT 19 05/20/2015 05:11 AM    ALT 16 05/20/2015 05:11 AM     CBC:   Lab Results   Component Value Date/Time    WBC 9.2 05/20/2015 05:11 AM    HGB 10.7 (L) 05/20/2015 05:11 AM    HCT 34.3 (L) 05/20/2015 05:11 AM    PLT 216 05/20/2015 05:11 AM     All Cardiac Markers in the last 24 hours:   Lab Results   Component Value Date/Time    TROPT <0.015 05/20/2015 05:11 AM    TROPT 0.028 05/19/2015 09:11 PM    TROPT 0.016 05/19/2015 05:12 PM     Recent Glucose Results:   Lab Results   Component Value Date/Time    GLU 127 (H) 05/20/2015 05:11 AM    GLU 83 05/19/2015 05:12 PM     ABG: No results found for: PH, PHI, PCO2, PCO2I, PO2, PO2I, HCO3, HCO3I, FIO2, FIO2I  COAGS:   Lab Results   Component Value Date/Time    PTP 14.6 (H) 05/20/2015 05:11 AM    INR 1.2 (H) 05/20/2015 05:11 AM     Liver Panel:   Lab Results   Component Value Date/Time    ALB 3.4 05/20/2015 05:11 AM    CBIL 0.1 05/20/2015 05:11 AM    TP 6.8 05/20/2015 05:11 AM    SGOT 19 05/20/2015 05:11 AM    ALT 16 05/20/2015 05:11 AM    AP 94 05/20/2015 05:11 AM       ECG:  NSR, normal ECG, vent rate 71 bpm    CXR:      EXAM: Frontal and lateral views of the chest.  ??  INDICATIONS: Chest pain  ??  COMPARISON: Radiograph dated 01/01/2015.  ??  IMPRESSION  Impression: No focal consolidations, overt congestive changes or large  effusions. There is mild right apical pleural-parenchymal thickening. Surgical  clips upper abdomen.      CTA Chest:      HISTORY: cp hx PE, INR 1.1; chest pain.  ??   TECHNIQUE: PE protocol chest CTA with IV contrast. Sagittal and coronal  reconstructions. 3-D MIPs were performed.  ??  COMPARISON: CT dated 04/17/2014  ??  FINDINGS:  There is no evidence of pulmonary embolism. No focal thoracic aortic dissection.  ??  Lung fields are clear. No pleural effusions. Central airways are clear, without  retained secretion or mass.  ??  No lymphadenopathy.  ??  Heart appears unchanged.  ??  Visualized intra-abdominal structures appear unchanged. Status post  cholecystectomy. There is persistent marked intrahepatic and extrahepatic  biliary ductal dilatation, similar to the prior exam. Probable bilateral  extrarenal pelves.  ??  Nonacute compression deformity involving the superior endplate of T1. Remaining  visualized osseous structures grossly unremarkable.  ??  IMPRESSION  Impression:  1. No pulmonary emboli, dissection or other acute abnormalities.  2. Persistent biliary ductal dilatation, unchanged from the prior exam.        Assessment/Plan     Active Problems:    Chest pain (05/19/2015)    Chest Pain r/o ACS r/o Cardiac Ischemic component w/ hx CAD s/p stent placement without   Evidence of PE or Pneumonia on CTA Chest    HTN, controlled on meds     Anemia, chronic iron deficiency without evidence of acute blood loss    Urticaria, suspect idiopathic r/o environmental vs medication effect  vs metabolic   with associated underlying Xerosis  Chronic Pain Syndrome w/ chronic lumbosacral back pain and hx of fibromyalgia    DM II, controlled     Hx PE and DVT on coumadin therapy with sub therapeutic INR     Anxiety and Depression      PLAN    Admit to a monitored bed on telemetry.  O2 if needed and supportive care.  GI and DVT prophylaxis.  Replace electrolytes as needed.  Continue home meds when possible.  Serial cardiac enzymes.  Check 2 D Echo to further assess cardiac function.  Consider NST if CE negative and pt remains  Clinically stable.  Monitor BS.  SSI prn for hyperglycemia.  Continue  coumadin.  Will utilize SC Lovenox  As a bridge until INR is therapeutic.  Hydrate with IVF and monitor to avoid fluid overload.  Continue  Pepcid with Benadryl prn and the addition of steroids as needed for pruritis.  Will initiate metabolic   Workup and perform local skin care as needed.  Further recommendations based on test results,   Response to treatment, and clinical course.        Otilio Carpen, MD  05/19/2015  10:37 PM

## 2015-05-19 NOTE — H&P (Signed)
Chart reviewed.  Pt seen and examined on 05/19/15.  Complete H&P to follow.

## 2015-05-19 NOTE — Progress Notes (Signed)
TRANSFER - OUT REPORT:    Verbal report given to Olegario Messier, RN(name) on [name] [name]  being transferred to 2206(unit) for routine progression of care       Report consisted of patient???s Situation, Background, Assessment and   Recommendations(SBAR).     Information from the following report(s) SBAR, ED Summary and MAR was reviewed with the receiving nurse.    Lines:   Peripheral IV 05/19/15 Left Basilic (Active)        Opportunity for questions and clarification was provided.      Patient transported with:   [employer]

## 2015-05-20 LAB — LIPID PANEL
CHOL/HDL Ratio: 2.2 Ratio (ref 0.0–4.4)
Cholesterol, total: 147 mg/dl (ref 140–199)
HDL Cholesterol: 68 mg/dl (ref 40–96)
LDL, calculated: 72 mg/dl (ref 0–130)
Triglyceride: 37 mg/dl (ref 29–150)

## 2015-05-20 LAB — METABOLIC PANEL, COMPREHENSIVE
ALT (SGPT): 16 U/L (ref 12–78)
AST (SGOT): 19 U/L (ref 15–37)
Albumin: 3.4 gm/dl (ref 3.4–5.0)
Alk. phosphatase: 94 U/L (ref 45–117)
BUN: 10 mg/dl (ref 7–25)
Bilirubin, total: 0.4 mg/dl (ref 0.2–1.0)
CO2: 26 mEq/L (ref 21–32)
Calcium: 8.9 mg/dl (ref 8.5–10.1)
Chloride: 108 mEq/L — ABNORMAL HIGH (ref 98–107)
Creatinine: 0.6 mg/dl (ref 0.6–1.3)
GFR est AA: >60
GFR est non-AA: >60
Glucose: 127 mg/dl — ABNORMAL HIGH (ref 74–106)
Potassium: 4 mEq/L (ref 3.5–5.1)
Protein, total: 6.8 gm/dl (ref 6.4–8.2)
Sodium: 142 mEq/L (ref 136–145)

## 2015-05-20 LAB — EKG, 12 LEAD, INITIAL
Atrial Rate: 71 {beats}/min
Calculated P Axis: 78 degrees
Calculated R Axis: 72 degrees
Calculated T Axis: 53 degrees
Diagnosis: NORMAL
P-R Interval: 140 ms
Q-T Interval: 386 ms
QRS Duration: 84 ms
QTC Calculation (Bezet): 419 ms
Ventricular Rate: 71 {beats}/min

## 2015-05-20 LAB — CBC WITH AUTOMATED DIFF
BASOPHILS: 0.4 % (ref 0–3)
EOSINOPHILS: 0.1 % (ref 0–5)
HCT: 34.3 % — ABNORMAL LOW (ref 37.0–50.0)
HGB: 10.7 gm/dl — ABNORMAL LOW (ref 13.0–17.2)
IMMATURE GRANULOCYTES: 0.3 % (ref 0.0–3.0)
LYMPHOCYTES: 9.1 % — ABNORMAL LOW (ref 28–48)
MCH: 26.5 pg (ref 25.4–34.6)
MCHC: 31.2 gm/dl (ref 30.0–36.0)
MCV: 84.9 fL (ref 80.0–98.0)
MONOCYTES: 2.2 % (ref 1–13)
MPV: 10.8 fL — ABNORMAL HIGH (ref 6.0–10.0)
NEUTROPHILS: 87.9 % — ABNORMAL HIGH (ref 34–64)
NRBC: 0 (ref 0–0)
PLATELET: 216 10*3/uL (ref 140–450)
RBC: 4.04 M/uL (ref 3.60–5.20)
RDW-SD: 55.3 — ABNORMAL HIGH (ref 36.4–46.3)
WBC: 9.2 10*3/uL (ref 4.0–11.0)

## 2015-05-20 LAB — GLUCOSE, POC
Glucose (POC): 132 mg/dL — ABNORMAL HIGH (ref 65–105)
Glucose (POC): 125 mg/dL — ABNORMAL HIGH (ref 65–105)

## 2015-05-20 LAB — TSH 3RD GENERATION: TSH: 0.306 u[IU]/mL — ABNORMAL LOW (ref 0.358–3.740)

## 2015-05-20 LAB — URINALYSIS W/ RFLX MICROSCOPIC
Bilirubin: NEGATIVE
Blood: NEGATIVE
Glucose: 100 mg/dl — AB
Ketone: NEGATIVE mg/dl
Leukocyte Esterase: NEGATIVE
Nitrites: NEGATIVE
Protein: NEGATIVE mg/dl
Specific gravity: 1.01 (ref 1.005–1.030)
Urobilinogen: 0.2 mg/dl (ref 0.0–1.0)
pH (UA): 5.5 (ref 5.0–9.0)

## 2015-05-20 LAB — HEPATIC FUNCTION PANEL
ALT (SGPT): 16 U/L (ref 12–78)
AST (SGOT): 22 U/L (ref 15–37)
Albumin: 3.6 gm/dl (ref 3.4–5.0)
Alk. phosphatase: 97 U/L (ref 45–117)
Bilirubin, direct: 0.1 mg/dl (ref 0.0–0.2)
Bilirubin, total: 0.3 mg/dl (ref 0.2–1.0)
Protein, total: 6.9 gm/dl (ref 6.4–8.2)

## 2015-05-20 LAB — TROPONIN I
Troponin-I: 0.028 ng/ml (ref 0.00–0.09)
Troponin-I: <0.015 ng/ml (ref 0.00–0.09)

## 2015-05-20 LAB — PROTHROMBIN TIME + INR
INR: 1.2 — ABNORMAL HIGH (ref 0.1–1.1)
Prothrombin time: 14.6 seconds — ABNORMAL HIGH (ref 11.5–14.0)

## 2015-05-20 LAB — BILIRUBIN, DIRECT: Bilirubin, direct: 0.1 mg/dl (ref 0.0–0.2)

## 2015-05-20 MED ORDER — LAMOTRIGINE 100 MG TAB
100 mg | Freq: Two times a day (BID) | ORAL | Status: DC
Start: 2015-05-20 — End: 2015-05-20
  Administered 2015-05-20 (×2): via ORAL

## 2015-05-20 MED ORDER — ONDANSETRON (PF) 4 MG/2 ML INJECTION
4 mg/2 mL | Freq: Four times a day (QID) | INTRAMUSCULAR | Status: DC | PRN
Start: 2015-05-20 — End: 2015-05-20
  Administered 2015-05-20 (×2): via INTRAVENOUS

## 2015-05-20 MED ORDER — SODIUM CHLORIDE 0.9 % IJ SYRG
Freq: Three times a day (TID) | INTRAMUSCULAR | Status: DC
Start: 2015-05-20 — End: 2015-05-20
  Administered 2015-05-20 (×2): via INTRAVENOUS

## 2015-05-20 MED ORDER — OXYCODONE 5 MG TAB
5 mg | ORAL | Status: DC | PRN
Start: 2015-05-20 — End: 2015-05-20

## 2015-05-20 MED ORDER — SODIUM CHLORIDE 0.9 % IJ SYRG
INTRAMUSCULAR | Status: DC | PRN
Start: 2015-05-20 — End: 2015-05-20

## 2015-05-20 MED ORDER — NALOXONE 0.4 MG/ML INJECTION
0.4 mg/mL | INTRAMUSCULAR | Status: DC | PRN
Start: 2015-05-20 — End: 2015-05-20

## 2015-05-20 MED ORDER — WARFARIN 5 MG TAB
5 mg | Freq: Every day | ORAL | Status: DC
Start: 2015-05-20 — End: 2015-05-19

## 2015-05-20 MED ORDER — WARFARIN 10 MG TAB
10 mg | ORAL | Status: AC
Start: 2015-05-20 — End: 2015-05-20
  Administered 2015-05-20: 05:00:00 via ORAL

## 2015-05-20 MED ORDER — ENOXAPARIN 60 MG/0.6 ML SUB-Q SYRINGE
60 mg/0.6 mL | Freq: Two times a day (BID) | SUBCUTANEOUS | Status: DC
Start: 2015-05-20 — End: 2015-05-20
  Administered 2015-05-20: 10:00:00 via SUBCUTANEOUS

## 2015-05-20 MED ORDER — SODIUM CHLORIDE 0.9 % INJECTION
20 mg/2 mL | Freq: Two times a day (BID) | INTRAMUSCULAR | Status: DC
Start: 2015-05-20 — End: 2015-05-20
  Administered 2015-05-20: 13:00:00 via INTRAVENOUS

## 2015-05-20 MED ORDER — SODIUM CHLORIDE 0.9 % IJ SYRG
INTRAMUSCULAR | Status: DC
Start: 2015-05-20 — End: 2015-05-20

## 2015-05-20 MED ORDER — FERROUS SULFATE 325 MG (65 MG ELEMENTAL IRON) TAB
325 mg (65 mg iron) | Freq: Two times a day (BID) | ORAL | Status: DC
Start: 2015-05-20 — End: 2015-05-20
  Administered 2015-05-20: 12:00:00 via ORAL

## 2015-05-20 MED ORDER — MORPHINE 2 MG/ML INJECTION
2 mg/mL | INTRAMUSCULAR | Status: DC | PRN
Start: 2015-05-20 — End: 2015-05-20
  Administered 2015-05-20 (×3): via INTRAVENOUS

## 2015-05-20 MED ORDER — DIPHENHYDRAMINE HCL 50 MG/ML IJ SOLN
50 mg/mL | Freq: Four times a day (QID) | INTRAMUSCULAR | Status: DC | PRN
Start: 2015-05-20 — End: 2015-05-20
  Administered 2015-05-20: 05:00:00 via INTRAVENOUS

## 2015-05-20 MED ORDER — DIPHENHYDRAMINE 25 MG CAP
25 mg | ORAL_CAPSULE | Freq: Four times a day (QID) | ORAL | 0 refills | Status: AC | PRN
Start: 2015-05-20 — End: 2015-05-30

## 2015-05-20 MED ORDER — FLU VACCINE QV 2016-17 (36 MOS+)(PF) 60 MCG (15 MCGX4)/0.5 ML IM SYRINGE
60 mcg (15 mcg x 4)/0.5 mL | INTRAMUSCULAR | Status: AC
Start: 2015-05-20 — End: 2015-05-20
  Administered 2015-05-20: 16:00:00 via INTRAMUSCULAR

## 2015-05-20 MED ORDER — PNEUMOCOCCAL 13-VAL CONJ VACCINE-DIP CRM (PF) 0.5 ML IM SYRINGE
0.5 mL | INTRAMUSCULAR | Status: AC
Start: 2015-05-20 — End: 2015-05-20
  Administered 2015-05-20: 16:00:00 via INTRAMUSCULAR

## 2015-05-20 MED ORDER — PHARMACY WARFARIN NOTE
Status: DC
Start: 2015-05-20 — End: 2015-05-20

## 2015-05-20 MED FILL — BD POSIFLUSH NORMAL SALINE 0.9 % INJECTION SYRINGE: INTRAMUSCULAR | Qty: 10

## 2015-05-20 MED FILL — FLUARIX QUAD 2016-2017 (PF) 60 MCG (15 MCG X 4)/0.5 ML IM SYRINGE: 60 mcg (15 mcg x 4)/0.5 mL | INTRAMUSCULAR | Qty: 0.5

## 2015-05-20 MED FILL — BD POSIFLUSH NORMAL SALINE 0.9 % INJECTION SYRINGE: INTRAMUSCULAR | Qty: 20

## 2015-05-20 MED FILL — DIPHENHYDRAMINE HCL 50 MG/ML IJ SOLN: 50 mg/mL | INTRAMUSCULAR | Qty: 1

## 2015-05-20 MED FILL — ONDANSETRON (PF) 4 MG/2 ML INJECTION: 4 mg/2 mL | INTRAMUSCULAR | Qty: 2

## 2015-05-20 MED FILL — PHARMACY WARFARIN NOTE: Qty: 1

## 2015-05-20 MED FILL — PREVNAR 13 (PF) 0.5 ML INTRAMUSCULAR SYRINGE: 0.5 mL | INTRAMUSCULAR | Qty: 0.5

## 2015-05-20 MED FILL — COUMADIN 10 MG TABLET: 10 mg | ORAL | Qty: 1

## 2015-05-20 MED FILL — ENOXAPARIN 60 MG/0.6 ML SUB-Q SYRINGE: 60 mg/0.6 mL | SUBCUTANEOUS | Qty: 0.6

## 2015-05-20 MED FILL — MORPHINE 2 MG/ML INJECTION: 2 mg/mL | INTRAMUSCULAR | Qty: 1

## 2015-05-20 MED FILL — LAMOTRIGINE 100 MG TAB: 100 mg | ORAL | Qty: 1

## 2015-05-20 MED FILL — ENOXAPARIN 80 MG/0.8 ML SUB-Q SYRINGE: 80 mg/0.8 mL | SUBCUTANEOUS | Qty: 0.8

## 2015-05-20 MED FILL — FERROUS SULFATE 325 MG (65 MG ELEMENTAL IRON) TAB: 325 mg (65 mg iron) | ORAL | Qty: 1

## 2015-05-20 MED FILL — SODIUM CHLORIDE 0.9 % INJECTION: INTRAMUSCULAR | Qty: 10

## 2015-05-20 NOTE — Discharge Summary (Signed)
Discharge Summary       Patient: [name] [name] Age: 64 y.o. DOB: 05-13-1951 MR#: [id number] SSN: [id number]  PCP on record: Altamese Dilling, MD  Admit date: 05/19/2015  Discharge date: 05/20/2015      Admission Diagnoses:   chest pain  -    Discharge Diagnoses:           Chest Pain  atypical??  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    Medical History   [name] [name] is a 64 y.o. female with hx of HTN, CAD s/p stent placement, prior CVA, PE and ??  DVT on coumadin therapy, and DM II who presents with c/o Chest Pain, Precordial which occurred  While at home. Pt also complains of diffuse itching which has persisted over the past 2 days. ??  Pt states that her CP started while she was scratching herself due to itching. Pt reports taking SL NTG  X 2 with subsequent resolution of CP. Denies fever or chills. No nausea, vomiting or diaphoresis.  No new medications or environmental exposures. No SOB or difficulty swallowing. Pt states that she ??  Has been compliant with taking her coumadin and her other medications.       Hospital Course by Problem   Chest Pain r/o ACS r/o Cardiac Ischemic component w/ hx CAD s/p stent placement without ??  Evidence of PE or Pneumonia on CTA Chest  Serial CE negative. Patient stated her chest pressure was related "stress out". She had NST in Feb 2016 with her cardiologist. Patient declined repeat NST. Family ( granddaughter) understood patient declining  Stress test. Patient will see her cardiology next week.   ??  HTN, controlled on meds   ??  Anemia, chronic iron deficiency without evidence of acute blood loss  ??  Urticaria, suspect idiopathic r/o environmental vs medication effect vs metabolic ??  with associated underlying Xerosis   ??   Chronic Pain Syndrome w/ chronic lumbosacral back pain and hx of fibromyalgia  ??  DM II, controlled   ??  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  Anxiety and Depression home medication.    Stable to D/C home  ??    Today's examination of the patient revealed:     Objective:   VS:   Visit Vitals   ??? BP 122/68 (BP 1 Location: Right arm, BP Patient Position: Head of bed elevated (Comment degrees))   ??? Pulse 77   ??? Temp 97.7 ??F (36.5 ??C)   ??? Resp 18   ??? Ht $Remo'5\' 7"'LuYRE$  (1.702 m)   ??? Wt 61.8 kg (136 lb 3.9 oz)   ??? SpO2 100%   ??? Breastfeeding No   ??? BMI 21.34 kg/m2      Tmax/24hrs: Temp (24hrs), Avg:98.1 ??F (36.7 ??C), Min:97.7 ??F (36.5 ??C), Max:98.5 ??F (36.9 ??C)     Input/Output: No intake or output data in the 24 hours ending 05/20/15 1111    General appearance: no distress  Eyes: sclera anicteric  ENT: no oral lesions, thyroid normal  Skin: no spider angiomata, jaundice,   Respiratory: clear to auscultation bilaterally  Cardiovascular: regular heart rate, no murmurs, no JVD  Abdomen: soft, non-tender, no rebound  Extremities: no muscle  wasting, no gross arthritic changes  GU: not examined  Neurologic: alert and oriented, cranial nerves grossly intact,         Labs:    Recent Results (from the past 24 hour(s))   CBC WITH AUTOMATED DIFF    Collection Time: 05/19/15  5:12 PM   Result Value Ref Range    WBC 5.0 4.0 - 11.0 1000/mm3    RBC 3.98 3.60 - 5.20 M/uL    HGB 10.5 (L) 13.0 - 17.2 gm/dl    HCT 34.3 (L) 37.0 - 50.0 %    MCV 86.2 80.0 - 98.0 fL    MCH 26.4 25.4 - 34.6 pg    MCHC 30.6 30.0 - 36.0 gm/dl    PLATELET 238 140 - 450 1000/mm3    MPV 10.8 (H) 6.0 - 10.0 fL    RDW-SD 55.4 (H) 36.4 - 46.3      NRBC 0 0 - 0      IMMATURE GRANULOCYTES 0.2 0.0 - 3.0 %    NEUTROPHILS 60.3 34 - 64 %    LYMPHOCYTES 28.0 28 - 48 %    MONOCYTES 7.5 1 - 13 %    EOSINOPHILS 3.4 0 - 5 %    BASOPHILS 0.6 0 - 3 %   METABOLIC PANEL, BASIC    Collection Time: 05/19/15  5:12 PM   Result Value Ref Range     Sodium 146 (H) 136 - 145 mEq/L    Potassium 3.5 3.5 - 5.1 mEq/L    Chloride 113 (H) 98 - 107 mEq/L    CO2 30 21 - 32 mEq/L    Glucose 83 74 - 106 mg/dl    BUN 10 7 - 25 mg/dl    Creatinine 0.8 0.6 - 1.3 mg/dl    GFR est AA >60.0      GFR est non-AA >60      Calcium 8.6 8.5 - 10.1 mg/dl   TROPONIN I    Collection Time: 05/19/15  5:12 PM   Result Value Ref Range    Troponin-I 0.016 0.00 - 0.09 ng/ml   PROTHROMBIN TIME + INR    Collection Time: 05/19/15  5:12 PM   Result Value Ref Range    Prothrombin time 13.6 11.5 - 14.0 seconds    INR 1.1 0.1 - 1.1     EKG, 12 LEAD, INITIAL    Collection Time: 05/19/15  7:51 PM   Result Value Ref Range    Ventricular Rate 71 BPM    Atrial Rate 71 BPM    P-R Interval 140 ms    QRS Duration 84 ms    Q-T Interval 386 ms    QTC Calculation (Bezet) 419 ms    Calculated P Axis 78 degrees    Calculated R Axis 72 degrees    Calculated T Axis 53 degrees    Diagnosis       Normal sinus rhythm  Normal ECG  When compared with ECG of 01-Jan-2015 16:29,  premature ventricular complexes are no longer present  Confirmed by Posey Pronto, MD, Anil ([id number]) on 05/20/2015 9:06:46 AM     HEPATIC FUNCTION PANEL    Collection Time: 05/19/15  9:11 PM   Result Value Ref Range    AST 22 15 - 37 U/L    ALT 16 12 - 78 U/L    Alk. phosphatase 97 45 - 117 U/L    Bilirubin, total 0.3 0.2 - 1.0 mg/dl    Protein, total 6.9 6.4 - 8.2  gm/dl    Albumin 3.6 3.4 - 5.0 gm/dl    Bilirubin, direct 0.1 0.0 - 0.2 mg/dl   TROPONIN I    Collection Time: 05/19/15  9:11 PM   Result Value Ref Range    Troponin-I 0.028 0.00 - 0.09 ng/ml   GLUCOSE, POC    Collection Time: 05/19/15 10:17 PM   Result Value Ref Range    Glucose (POC) 132 (H) 65 - 105 mg/dL   URINALYSIS W/ RFLX MICROSCOPIC    Collection Time: 05/20/15  4:28 AM   Result Value Ref Range    Color PALE YELLOW (A) YELLOW,STRAW      Appearance CLEAR CLEAR      Glucose 100 (A) NEGATIVE,Negative mg/dl    Bilirubin NEGATIVE NEGATIVE,Negative      Ketone NEGATIVE NEGATIVE,Negative mg/dl     Specific gravity 1.010 1.005 - 1.030      Blood NEGATIVE NEGATIVE,Negative      pH (UA) 5.5 5.0 - 9.0      Protein NEGATIVE NEGATIVE,Negative mg/dl    Urobilinogen 0.2 0.0 - 1.0 mg/dl    Nitrites NEGATIVE NEGATIVE,Negative      Leukocyte Esterase NEGATIVE NEGATIVE,Negative     TROPONIN I    Collection Time: 05/20/15  5:11 AM   Result Value Ref Range    Troponin-I <0.015 0.00 - 0.09 ng/ml   CBC WITH AUTOMATED DIFF    Collection Time: 05/20/15  5:11 AM   Result Value Ref Range    WBC 9.2 4.0 - 11.0 1000/mm3    RBC 4.04 3.60 - 5.20 M/uL    HGB 10.7 (L) 13.0 - 17.2 gm/dl    HCT 34.3 (L) 37.0 - 50.0 %    MCV 84.9 80.0 - 98.0 fL    MCH 26.5 25.4 - 34.6 pg    MCHC 31.2 30.0 - 36.0 gm/dl    PLATELET 216 140 - 450 1000/mm3    MPV 10.8 (H) 6.0 - 10.0 fL    RDW-SD 55.3 (H) 36.4 - 46.3      NRBC 0 0 - 0      IMMATURE GRANULOCYTES 0.3 0.0 - 3.0 %    NEUTROPHILS 87.9 (H) 34 - 64 %    LYMPHOCYTES 9.1 (L) 28 - 48 %    MONOCYTES 2.2 1 - 13 %    EOSINOPHILS 0.1 0 - 5 %    BASOPHILS 0.4 0 - 3 %   METABOLIC PANEL, COMPREHENSIVE    Collection Time: 05/20/15  5:11 AM   Result Value Ref Range    Sodium 142 136 - 145 mEq/L    Potassium 4.0 3.5 - 5.1 mEq/L    Chloride 108 (H) 98 - 107 mEq/L    CO2 26 21 - 32 mEq/L    Glucose 127 (H) 74 - 106 mg/dl    BUN 10 7 - 25 mg/dl    Creatinine 0.6 0.6 - 1.3 mg/dl    GFR est AA >60.0      GFR est non-AA >60      Calcium 8.9 8.5 - 10.1 mg/dl    AST 19 15 - 37 U/L    ALT 16 12 - 78 U/L    Alk. phosphatase 94 45 - 117 U/L    Bilirubin, total 0.4 0.2 - 1.0 mg/dl    Protein, total 6.8 6.4 - 8.2 gm/dl    Albumin 3.4 3.4 - 5.0 gm/dl   PROTHROMBIN TIME + INR    Collection Time: 05/20/15  5:11 AM  Result Value Ref Range    Prothrombin time 14.6 (H) 11.5 - 14.0 seconds    INR 1.2 (H) 0.1 - 1.1     LIPID PANEL    Collection Time: 05/20/15  5:11 AM   Result Value Ref Range    Cholesterol, total 147 140 - 199 mg/dl    HDL Cholesterol 68 40 - 96 mg/dl    Triglyceride 37 29 - 150 mg/dl     LDL, calculated 72 0 - 130 mg/dl    CHOL/HDL Ratio 2.2 0.0 - 4.4 Ratio   TSH 3RD GENERATION    Collection Time: 05/20/15  5:11 AM   Result Value Ref Range    TSH 0.306 (L) 0.358 - 3.740 uIU/mL   BILIRUBIN, DIRECT    Collection Time: 05/20/15  5:11 AM   Result Value Ref Range    Bilirubin, direct 0.1 0.0 - 0.2 mg/dl   GLUCOSE, POC    Collection Time: 05/20/15  7:31 AM   Result Value Ref Range    Glucose (POC) 125 (H) 65 - 105 mg/dL     Additional Data Reviewed:      XR Results (most recent):    Results from Hospital Encounter encounter on 05/19/15   XR CHEST PA LAT   Narrative EXAM: Frontal and lateral views of the chest.    INDICATIONS: Chest pain    COMPARISON: Radiograph dated 01/01/2015.         Impression Impression: No focal consolidations, overt congestive changes or large  effusions. There is mild right apical pleural-parenchymal thickening. Surgical  clips upper abdomen.              CT Results (most recent):    Results from Hospital Encounter encounter on 05/19/15   CTA CHEST W WO CONT   Narrative HISTORY: cp hx PE, INR 1.1;  chest pain.    TECHNIQUE: PE protocol chest CTA with IV contrast. Sagittal and coronal  reconstructions. 3-D MIPs were performed.    COMPARISON: CT dated 04/17/2014    FINDINGS:  There is no evidence of pulmonary embolism. No focal thoracic aortic dissection.    Lung fields are clear. No pleural effusions. Central airways are clear, without  retained secretion or mass.    No lymphadenopathy.    Heart appears unchanged.    Visualized intra-abdominal structures appear unchanged. Status post  cholecystectomy. There is persistent marked intrahepatic and extrahepatic  biliary ductal dilatation, similar to the prior exam. Probable bilateral  extrarenal pelves.    Nonacute compression deformity involving the superior endplate of T1. Remaining  visualized osseous structures grossly unremarkable.         Impression Impression:  1. No pulmonary emboli, dissection or other acute abnormalities.   2. Persistent biliary ductal dilatation, unchanged from the prior exam.                 Condition:   Disposition:    Home   Home with Home Health   SNF/NH   [place] with family   Alternate Facility:____________________      Discharge Medications:     Current Discharge Medication List      START taking these medications    Details   diphenhydrAMINE (BENADRYL) 25 mg capsule Take 1 Cap by mouth every six (6) hours as needed for up to 10 days.  Qty: 10 Cap, Refills: 0         CONTINUE these medications which have NOT CHANGED    Details  alendronate-vitamin d3 70-2,800 mg-unit per tablet Take 1 Tab by mouth every seven (7) days.      FLUoxetine (PROZAC) 40 mg capsule Take 1 Cap by mouth daily.  Qty: 30 Cap, Refills: 0      lamoTRIgine (LAMICTAL) 100 mg tablet Take 1 Tab by mouth two (2) times a day.  Qty: 60 Tab, Refills: 0      oxyCODONE IR (OXY-IR) 15 mg immediate release tablet Take 15 mg by mouth four (4) times daily.      ferrous gluconate 324 mg (36 mg iron) tab Take 1 Tab by mouth Before breakfast and dinner.  Qty: 60 Tab, Refills: 2      hydroxychloroquine (PLAQUENIL) 200 mg tablet Take 200 mg by mouth two (2) times a day.      amitriptyline (ELAVIL) 75 mg tablet Take 1 Tab by mouth nightly.  Qty: 30 Tab, Refills: 1      albuterol (PROVENTIL HFA) 90 mcg/Actuation inhaler Take 1 Puff by inhalation as needed.      albuterol-ipratropium (DUO-NEB) 0.5 mg-3 mg(2.5 mg base)/3 mL nebulizer solution 3 mL by Nebulization route once.      warfarin (COUMADIN) 5 mg tablet Take 5 mg by mouth daily. Take 2 tablets ($RemoveBe'10mg'narzwlHvk$ )      Cholecalciferol, Vitamin D3, (VITAMIN D) 1,000 unit Cap Take 50,000 Units by mouth every seven (7) days.               Follow-up Appointments:   1. Your PCP: Altamese Dilling, MD, within 3-5 days  2. Your cardiology in  3 days              Thank you Dr Altamese Dilling, MD for allowing [place] to participate in the care of ALICHA RASPBERRY       >45 minutes spent coordinating this discharge (review  instructions/follow-up, prescriptions)    Signed:    [facility]  Otila Kluver, MD  05/20/2015  11:11 AM

## 2015-05-20 NOTE — Other (Signed)
Patient received reviewed sbar/mar

## 2015-05-20 NOTE — Progress Notes (Addendum)
Tentative dc plan:    D/C plan home with Home Health - [facility] - DCPA notified to load - PT/OT ordered       Anticipated Discharge Date:   05/21/15    PCP: Madelyn Brunner, MD       Home Environment:    Lives at [address]  [place] [address]    [phone].         Extended Emergency Contact Information  Primary Emergency Contact: Kerby Less  Address: [address]           [place], [place] [address] [place]  Home Phone: [phone]  Relation: Daughter       Case Management Assessment    ABUSE/NEGLECT SCREENING   Physical Abuse/Neglect: Denies   Sexual Abuse: Denies   Sexual Abuse: Denies   Other Abuse/Issues: Denies          PRIMARY DECISION MAKER   Primary Decision Maker Name: Rasheedah Reis   Primary Decision Maker Phone Number: [phone]   Primary Decision Maker Address: [address] [place] , [address]   Primary Decision Maker Relationship to Patient: Adult child                   CARE MANAGEMENT INTERVENTIONS       PCP Verified by CM: Yes   Last Visit to PCP: 05/07/15       Mode of Transport at Discharge: Other (see comment) (Daughter )       Transition of Care Consult (CM Consult): Discharge Planning, Home Health   Comfort Care: No   Reason Outside Comfort Care Chosen: Patient already serviced by other home care/hospice agency   MyChart Signup: No   Discharge Durable Medical Equipment: No               Current Support Network: Other (Daughters live with patient )   Reason for Referral: DCP Rounds   History Provided By: Patient   Patient Orientation: Alert and Oriented, Self, Person, Situation, Place   Cognition: Alert       Previous Living Arrangement: Lives with Family Dependent       Prior Functional Level: Assistance with the following:, Housework, Mobility, Cooking, Shopping   Current Functional Level: Assistance with the following:, Housework, Cooking, Mobility, Shopping   Primary Language: English    Can patient return to prior living arrangement: Yes   Ability to make needs known:: Good   Family able to assist with home care needs:: Yes                   Anticipated Discharge Needs: Home Health Services   Confirm Follow Up Transport: Family   Confirm Transport and Arrange: Yes   Plan discussed with Pt/Family/Caregiver: Yes   Freedom of Choice Offered: Yes      DISCHARGE LOCATION   Discharge Placement: Home with home health

## 2015-05-20 NOTE — Other (Signed)
----------  DocumentID:   [id number]------------------------------------------------              [facility]                       Patient Education Report         Name: ELFA, WOOTON                  Date: 05/20/2015    MRN: [id number]                    Time: 1:30:20 AM         Patient ordered video: Patient Safety: Stay Safe While you are in the   Hospital    from [id number] via phone number: [phone] at 1:30:20 AM

## 2015-05-20 NOTE — ED Provider Notes (Signed)
[facility]  EMERGENCY DEPARTMENT TREATMENT REPORT  NAME:  [name] [name], [name] [name]  SEX:   F  ADMIT: 05/19/2015  DOB:   September 04, 1950  MR#    [id number]  ROOM:  2206  TIME DICTATED: 06 50 PM  ACCT#  [id number]    cc: Abbey Chatters MD    I hereby certify this patient for admission based upon medical necessity as   noted below:    PRIMARY CARE PHYSICIAN:  Abbey Chatters, MD.      CHIEF COMPLAINT:  Skin problem.    HISTORY OF PRESENT ILLNESS:  This is a 64 year old Caucasian female with history of CAD, DVTs,   hypertension, hyperlipidemia, lupus, pulmonary embolism and stroke who   presents to the ED due to diffuse skin pruritus for the past day.  The patient   states that her skin feels dry, itchy and diffusely hurts.  She states that   she does not know any known triggers.  She has attempted to put cocoa butter   lotion on her skin with little relief and finally she called EMS due to the   intense pruritus  that she has been experiencing.  The patient also states   that yesterday she felt chest pain and chest tightness in the center of her   chest intermittently.  She states that she took 1 nitro with minimal relief.    She took a second nitro and it felt better.  She did not get evaluated for it   yesterday.  She also states that during that time she also experienced   intermittent palpitations.  The patient states that she currently is not   experiencing any chest pain.  The patient also states that while having the   chest pain  she had experienced intermittent shortness of breath.    REVIEW OF SYSTEMS:  CONSTITUTIONAL:  No fever, no chills.  EYES:  No visual symptoms.  ENT:  No URI symptoms.  RESPIRATORY:  Shortness of breath yesterday.  No cough.  CARDIOVASCULAR:  Experienced chest pain in the midsternal region as well as   chest pressure and palpitations yesterday, none currently.  GASTROINTESTINAL:  Nausea, vomiting, denies abdominal pain.  GENITOURINARY:  No urinary symptoms.   MUSCULOSKELETAL:  No lower extremity pain or swelling.  INTEGUMENTARY:  Diffuse skin dryness and pruritus throughout body.  NEUROLOGICAL:  No headaches.    PAST MEDICAL HISTORY:  Multiple and reviewed in Epic.    SOCIAL HISTORY:  Former smoker.    FAMILY HISTORY:  Maternal grandmother with diabetes.    ALLERGIES:  ASPIRIN, OXYMORPHONE, AND TYLENOL.    CURRENT MEDICATIONS:  Multiple and reviewed in Epic.      PHYSICAL EXAMINATION Epic:  VITAL SIGNS:  Temperature is 98.5, blood pressure is 130/80, pulse 99,   respirations 22, SpO2 on room air is 100%.  GENERAL APPEARANCE:  The patient appears in acute distress consistently   scratching herself all throughout her body on the upper or lower extremities   as well as torso.  EYES:  Nonicteric.  Conjunctivae clear, lids normal.  Pupils equal,   symmetrical, and normally reactive.  NECK:  Supple.  RESPIRATORY:  Clear and equal breath sounds.  No respiratory distress,   tachypnea, or accessory muscle use.    CARDIOVASCULAR:   Heart regular, without murmurs, gallops, rubs, or thrills.     DP pulses 2+ and equal bilaterally.   GI:  Abdomen soft, nontender, without complaint of pain to palpation.  No   hepatomegaly  or splenomegaly.   MUSCULOSKELETAL:   No lower extremity swelling, no calf pain or  tenderness.  SKIN:  Scratches noted diffusely throughout body in conjunction with patient   pruritus.  NEUROLOGIC:  Alert, oriented.  Sensation intact.  Motor strength equal and   symmetric.  PSYCHIATRIC:  Oriented to time, place and person.     CONTINUATION BY DR. Alban Marucci TSUCHITANI:    I interviewed and examined the patient.  I discussed with the advanced   practice clinician and agree with their evaluation and plan as documented   here.      EKG shows a sinus rhythm, no acute ST elevation.  CBC with a white count of   5.8, hemoglobin 10.5, platelets 238.  INR is 1.1.  Chemistry:  Sodium 146,   chloride 113, otherwise unremarkable.  LFTs are normal.  Troponin 0.016.     X-ray of the chest read by the radiologist, no focal consolidations over   congestive changes or large effusion, mild right apical thickening, surgical   clips upper abdomen.  CTA of the chest read by the radiologist, no pulmonary   emboli, dissection or other acute abnormality.  Persistent biliary ductal   dilatation unchanged from prior.    EMERGENCY ROOM DEPARTMENT:  The patient has 2 complaints.  The first is itching.  She does not endorse any   new medicines or exposures.  She said she walked through the grass a few days   ago, but this is nothing new for her.  She had some scattered urticaria with   no angioedema or wheezing.  We did  labs and LFTs that   are normal and she was much improved with Benadryl IV, IV Pepcid, and IV   Solu-Medrol.  The itching stopped and she could rest comfortably.  Her second   complaint was chest pain and she noted that last night she was having a bad   chest pain; it is now gone but with her extensive medical history, we ended up   doing EKG, labs, troponin, and with her subtherapeutic INR, history of PA and   initial pulse being 99, decided to CTA her for further evaluation.  She is   pleased with her results, feeling better, agrees with plan for admission.    Again, no new exposures she can identify.  Discussed with Dr. Langston Masker who will   accept to Obs telemetry.    DIAGNOSES:  1.  Chest pain.    2.  Urticaria.    She remained chest pain-free and her itching resolved with the medications   given here.      ___________________  Tana Conch MD  Dictated By: Scherrie Gerlach. [place], PA-C    My signature above authenticates this document and my orders, the final   diagnosis (es), discharge prescription (s), and instructions in the Epic   record.  If you have any questions please contact [phone].    Nursing notes have been reviewed by the physician/ advanced practice   clinician.    FS  D:05/19/2015 18:50:41  T: 05/20/2015 10:59:52  [id number]

## 2015-05-21 LAB — CULTURE, URINE
CULTURE RESULT: NO GROWTH
Culture result: NO GROWTH

## 2015-06-14 ENCOUNTER — Inpatient Hospital Stay
Admit: 2015-06-14 | Discharge: 2015-06-15 | Disposition: A | Discharge status: Home / Self Care | Payer: MEDICAID | Attending: Emergency Medicine

## 2015-06-14 ENCOUNTER — Emergency Department: Admit: 2015-06-15 | Payer: MEDICAID | Primary: Internal Medicine

## 2015-06-14 DIAGNOSIS — R079 Chest pain, unspecified: Secondary | ICD-10-CM

## 2015-06-14 NOTE — ED Notes (Signed)
Pt given 2 warm blankets. Pt given phone to use. Picc rn at bedside. I tried once to obtain iv access without success and EMS tried twice.

## 2015-06-14 NOTE — ED Notes (Signed)
I padded the patient's side rails because the patient requested this in case she has a seizure.

## 2015-06-14 NOTE — ED Triage Notes (Signed)
Per EMS they were called for a pt with stabbing, burning epigastric pain for several weeks. Pt has a history of gastric ulcers and cannot take aspirin, took 2 home nitro and one from EMS with no relief.

## 2015-06-15 LAB — METABOLIC PANEL, COMPREHENSIVE
ALT (SGPT): 18 U/L (ref 12–78)
AST (SGOT): 28 U/L (ref 15–37)
Albumin: 3.5 gm/dl (ref 3.4–5.0)
Alk. phosphatase: 85 U/L (ref 45–117)
BUN: 9 mg/dl (ref 7–25)
Bilirubin, total: 0.3 mg/dl (ref 0.2–1.0)
CO2: 31 mEq/L (ref 21–32)
Calcium: 8.3 mg/dl — ABNORMAL LOW (ref 8.5–10.1)
Chloride: 108 mEq/L — ABNORMAL HIGH (ref 98–107)
Creatinine: 0.7 mg/dl (ref 0.6–1.3)
GFR est AA: >60
GFR est non-AA: >60
Glucose: 85 mg/dl (ref 74–106)
Potassium: 3.9 mEq/L (ref 3.5–5.1)
Protein, total: 6.8 gm/dl (ref 6.4–8.2)
Sodium: 146 mEq/L — ABNORMAL HIGH (ref 136–145)

## 2015-06-15 LAB — GLUCOSE, POC
Glucose (POC): 112 mg/dL — ABNORMAL HIGH (ref 65–105)
Glucose (POC): 97 mg/dL (ref 65–105)
Glucose (POC): 110 mg/dL — ABNORMAL HIGH (ref 65–105)
Glucose (POC): 123 mg/dL — ABNORMAL HIGH (ref 65–105)

## 2015-06-15 LAB — CBC WITH AUTOMATED DIFF
BASOPHILS: 0.9 % (ref 0–3)
EOSINOPHILS: 5.2 % — ABNORMAL HIGH (ref 0–5)
HCT: 30.9 % — ABNORMAL LOW (ref 37.0–50.0)
HGB: 9.5 gm/dl — ABNORMAL LOW (ref 13.0–17.2)
IMMATURE GRANULOCYTES: 0.2 % (ref 0.0–3.0)
LYMPHOCYTES: 32.1 % (ref 28–48)
MCH: 26.2 pg (ref 25.4–34.6)
MCHC: 30.7 gm/dl (ref 30.0–36.0)
MCV: 85.4 fL (ref 80.0–98.0)
MONOCYTES: 6.8 % (ref 1–13)
MPV: 10.9 fL — ABNORMAL HIGH (ref 6.0–10.0)
NEUTROPHILS: 54.8 % (ref 34–64)
NRBC: 0 (ref 0–0)
PLATELET: 204 10*3/uL (ref 140–450)
RBC: 3.62 M/uL (ref 3.60–5.20)
RDW-SD: 57.7 — ABNORMAL HIGH (ref 36.4–46.3)
WBC: 5.7 10*3/uL (ref 4.0–11.0)

## 2015-06-15 LAB — POC URINE MICROSCOPIC

## 2015-06-15 LAB — POC URINE MACROSCOPIC
Bilirubin: NEGATIVE
Blood: NEGATIVE
Glucose: NEGATIVE mg/dl
Ketone: NEGATIVE mg/dl
Nitrites: NEGATIVE
Protein: NEGATIVE mg/dl
Specific gravity: 1.015 (ref 1.005–1.030)
Urobilinogen: 0.2 EU/dl (ref 0.0–1.0)
pH (UA): 7 (ref 5–9)

## 2015-06-15 LAB — TROPONIN I
Troponin-I: <0.015 ng/ml (ref 0.000–0.045)
Troponin-I: <0.015 ng/ml (ref 0.000–0.045)

## 2015-06-15 LAB — EKG, 12 LEAD, INITIAL
Atrial Rate: 75 {beats}/min
Calculated P Axis: 72 degrees
Calculated R Axis: 48 degrees
Calculated T Axis: 51 degrees
Diagnosis: NORMAL
P-R Interval: 150 ms
Q-T Interval: 374 ms
QRS Duration: 84 ms
QTC Calculation (Bezet): 417 ms
Ventricular Rate: 75 {beats}/min

## 2015-06-15 LAB — PROTHROMBIN TIME + INR
INR: 1.2 — ABNORMAL HIGH (ref 0.1–1.1)
Prothrombin time: 15.1 seconds — ABNORMAL HIGH (ref 11.5–14.0)

## 2015-06-15 LAB — LIPASE: Lipase: 93 U/L (ref 73–393)

## 2015-06-15 LAB — POC FECAL OCCULT BLOOD: Occult blood, stool: NEGATIVE — AB

## 2015-06-15 LAB — POC TROPONIN: Troponin-I: 0 ng/ml (ref 0.00–0.07)

## 2015-06-15 MED ORDER — PHARMACY WARFARIN NOTE
Status: DC
Start: 2015-06-15 — End: 2015-06-16

## 2015-06-15 MED ORDER — SODIUM CHLORIDE 0.9 % IJ SYRG
Freq: Once | INTRAMUSCULAR | Status: AC
Start: 2015-06-15 — End: 2015-06-14
  Administered 2015-06-15: 02:00:00 via INTRAVENOUS

## 2015-06-15 MED ORDER — IOPAMIDOL 76 % IV SOLN
370 mg iodine /mL (76 %) | Freq: Once | INTRAVENOUS | Status: AC
Start: 2015-06-15 — End: 2015-06-14
  Administered 2015-06-15: 02:00:00 via INTRAVENOUS

## 2015-06-15 MED ORDER — ENOXAPARIN 40 MG/0.4 ML SUB-Q SYRINGE
40 mg/0.4 mL | SUBCUTANEOUS | Status: DC
Start: 2015-06-15 — End: 2015-06-15

## 2015-06-15 MED ORDER — DIPHENHYDRAMINE 25 MG CAP
25 mg | Freq: Four times a day (QID) | ORAL | Status: DC | PRN
Start: 2015-06-15 — End: 2015-06-16

## 2015-06-15 MED ORDER — ALUM-MAG HYDROXIDE-SIMETH 400 MG-400 MG-40 MG/5 ML ORAL SUSP
400-400-40 mg/5 mL | ORAL | Status: DC | PRN
Start: 2015-06-15 — End: 2015-06-16

## 2015-06-15 MED ORDER — LAMOTRIGINE 100 MG TAB
100 mg | Freq: Two times a day (BID) | ORAL | Status: DC
Start: 2015-06-15 — End: 2015-06-16
  Administered 2015-06-15 – 2015-06-16 (×3): via ORAL

## 2015-06-15 MED ORDER — PANTOPRAZOLE 40 MG IV SOLR
40 mg | INTRAVENOUS | Status: AC
Start: 2015-06-15 — End: 2015-06-15
  Administered 2015-06-15: 04:00:00 via INTRAVENOUS

## 2015-06-15 MED ORDER — SODIUM CHLORIDE 0.9% BOLUS IV
0.9 % | INTRAVENOUS | Status: AC
Start: 2015-06-15 — End: 2015-06-14
  Administered 2015-06-15: 01:00:00 via INTRAVENOUS

## 2015-06-15 MED ORDER — SODIUM CHLORIDE 0.9 % IJ SYRG
INTRAMUSCULAR | Status: AC
Start: 2015-06-15 — End: 2015-06-16

## 2015-06-15 MED ORDER — SUCRALFATE 100 MG/ML ORAL SUSP
100 mg/mL | ORAL | Status: AC
Start: 2015-06-15 — End: 2015-06-15
  Administered 2015-06-15: 04:00:00 via ORAL

## 2015-06-15 MED ORDER — OMEPRAZOLE 20 MG CAP, DELAYED RELEASE
20 mg | Freq: Every day | ORAL | Status: DC
Start: 2015-06-15 — End: 2015-06-16
  Administered 2015-06-15 – 2015-06-16 (×2): via ORAL

## 2015-06-15 MED ORDER — MORPHINE 4 MG/ML SYRINGE
4 mg/mL | INTRAMUSCULAR | Status: DC | PRN
Start: 2015-06-15 — End: 2015-06-16
  Administered 2015-06-15 – 2015-06-16 (×6): via INTRAVENOUS

## 2015-06-15 MED ORDER — INSULIN LISPRO 100 UNIT/ML INJECTION
100 unit/mL | Freq: Four times a day (QID) | SUBCUTANEOUS | Status: DC
Start: 2015-06-15 — End: 2015-06-15

## 2015-06-15 MED ORDER — SODIUM CHLORIDE 0.9 % IJ SYRG
Freq: Once | INTRAMUSCULAR | Status: AC
Start: 2015-06-15 — End: 2015-06-14
  Administered 2015-06-15: 01:00:00 via INTRAVENOUS

## 2015-06-15 MED ORDER — AMITRIPTYLINE 25 MG TAB
25 mg | Freq: Every evening | ORAL | Status: DC
Start: 2015-06-15 — End: 2015-06-16
  Administered 2015-06-16: 02:00:00 via ORAL

## 2015-06-15 MED ORDER — GLUCAGON 1 MG INJECTION
1 mg | INTRAMUSCULAR | Status: DC | PRN
Start: 2015-06-15 — End: 2015-06-16

## 2015-06-15 MED ORDER — SODIUM CHLORIDE 0.9% BOLUS IV
0.9 % | INTRAVENOUS | Status: DC
Start: 2015-06-15 — End: 2015-06-14

## 2015-06-15 MED ORDER — SIMVASTATIN 40 MG TAB
40 mg | Freq: Every evening | ORAL | Status: DC
Start: 2015-06-15 — End: 2015-06-16
  Administered 2015-06-15: 21:00:00 via ORAL

## 2015-06-15 MED ORDER — WARFARIN 5 MG TAB
5 mg | Freq: Once | ORAL | Status: AC
Start: 2015-06-15 — End: 2015-06-15
  Administered 2015-06-15: 21:00:00 via ORAL

## 2015-06-15 MED ORDER — ONDANSETRON (PF) 4 MG/2 ML INJECTION
4 mg/2 mL | Freq: Four times a day (QID) | INTRAMUSCULAR | Status: DC | PRN
Start: 2015-06-15 — End: 2015-06-16
  Administered 2015-06-15 – 2015-06-16 (×2): via INTRAVENOUS

## 2015-06-15 MED ORDER — CYCLOBENZAPRINE 10 MG TAB
10 mg | Freq: Three times a day (TID) | ORAL | Status: DC | PRN
Start: 2015-06-15 — End: 2015-06-16

## 2015-06-15 MED ORDER — ENOXAPARIN 60 MG/0.6 ML SUB-Q SYRINGE
60 mg/0.6 mL | Freq: Two times a day (BID) | SUBCUTANEOUS | Status: DC
Start: 2015-06-15 — End: 2015-06-16
  Administered 2015-06-16 (×2): via SUBCUTANEOUS

## 2015-06-15 MED ORDER — ONDANSETRON 4 MG TAB, RAPID DISSOLVE
4 mg | ORAL | Status: AC
Start: 2015-06-15 — End: 2015-06-14
  Administered 2015-06-15: 01:00:00 via ORAL

## 2015-06-15 MED ORDER — ENOXAPARIN 80 MG/0.8 ML SUB-Q SYRINGE
80 mg/0.8 mL | SUBCUTANEOUS | Status: DC
Start: 2015-06-15 — End: 2015-06-15
  Administered 2015-06-15: 15:00:00 via SUBCUTANEOUS

## 2015-06-15 MED ORDER — SODIUM CHLORIDE 0.9 % IJ SYRG
INTRAMUSCULAR | Status: AC
Start: 2015-06-15 — End: 2015-06-15
  Administered 2015-06-15: 13:00:00

## 2015-06-15 MED ORDER — MORPHINE 4 MG/ML SYRINGE
4 mg/mL | INTRAMUSCULAR | Status: AC
Start: 2015-06-15 — End: 2015-06-14
  Administered 2015-06-15: 01:00:00 via INTRAMUSCULAR

## 2015-06-15 MED ORDER — PROMETHAZINE 25 MG TAB
25 mg | Freq: Four times a day (QID) | ORAL | Status: DC | PRN
Start: 2015-06-15 — End: 2015-06-16
  Administered 2015-06-15 (×2): via ORAL

## 2015-06-15 MED ORDER — D5-1/2 NS & POTASSIUM CHLORIDE 20 MEQ/L IV
20 mEq/L | INTRAVENOUS | Status: AC
Start: 2015-06-15 — End: 2015-06-15
  Administered 2015-06-15: 10:00:00 via INTRAVENOUS

## 2015-06-15 MED ORDER — CITALOPRAM 20 MG TAB
20 mg | Freq: Every day | ORAL | Status: DC
Start: 2015-06-15 — End: 2015-06-16
  Administered 2015-06-15 – 2015-06-16 (×2): via ORAL

## 2015-06-15 MED ORDER — DEXTROSE 50% IN WATER (D50W) IV SYRG
INTRAVENOUS | Status: DC | PRN
Start: 2015-06-15 — End: 2015-06-16

## 2015-06-15 MED FILL — SIMVASTATIN 40 MG TAB: 40 mg | ORAL | Qty: 2

## 2015-06-15 MED FILL — ONDANSETRON 4 MG TAB, RAPID DISSOLVE: 4 mg | ORAL | Qty: 1

## 2015-06-15 MED FILL — ENOXAPARIN 80 MG/0.8 ML SUB-Q SYRINGE: 80 mg/0.8 mL | SUBCUTANEOUS | Qty: 0.8

## 2015-06-15 MED FILL — CITALOPRAM 20 MG TAB: 20 mg | ORAL | Qty: 1

## 2015-06-15 MED FILL — MORPHINE 4 MG/ML SYRINGE: 4 mg/mL | INTRAMUSCULAR | Qty: 1

## 2015-06-15 MED FILL — BD POSIFLUSH NORMAL SALINE 0.9 % INJECTION SYRINGE: INTRAMUSCULAR | Qty: 10

## 2015-06-15 MED FILL — ONDANSETRON (PF) 4 MG/2 ML INJECTION: 4 mg/2 mL | INTRAMUSCULAR | Qty: 2

## 2015-06-15 MED FILL — PROMETHAZINE 25 MG TAB: 25 mg | ORAL | Qty: 1

## 2015-06-15 MED FILL — ENOXAPARIN 60 MG/0.6 ML SUB-Q SYRINGE: 60 mg/0.6 mL | SUBCUTANEOUS | Qty: 0.6

## 2015-06-15 MED FILL — PHARMACY WARFARIN NOTE: Qty: 1

## 2015-06-15 MED FILL — OMEPRAZOLE 20 MG CAP, DELAYED RELEASE: 20 mg | ORAL | Qty: 1

## 2015-06-15 MED FILL — SODIUM CHLORIDE 0.9 % INJECTION: INTRAMUSCULAR | Qty: 10

## 2015-06-15 MED FILL — LAMOTRIGINE 100 MG TAB: 100 mg | ORAL | Qty: 1

## 2015-06-15 MED FILL — ISOVUE-370  76 % INTRAVENOUS SOLUTION: 370 mg iodine /mL (76 %) | INTRAVENOUS | Qty: 85

## 2015-06-15 MED FILL — SUCRALFATE 100 MG/ML ORAL SUSP: 100 mg/mL | ORAL | Qty: 10

## 2015-06-15 MED FILL — PROTONIX 40 MG INTRAVENOUS SOLUTION: 40 mg | INTRAVENOUS | Qty: 40

## 2015-06-15 MED FILL — COUMADIN 5 MG TABLET: 5 mg | ORAL | Qty: 2

## 2015-06-15 NOTE — ED Provider Notes (Signed)
[facility]  EMERGENCY DEPARTMENT TREATMENT REPORT  NAME:  [name], [name]  SEX:   F  ADMIT: 06/14/2015  DOB:   [birth year]  MR#    [id number]  ROOM:  CD01  TIME DICTATED: 12 17 AM  ACCT#  [id number]    cc: Abbey ChattersANIEL BLUESTEIN MD    I hereby certify this patient for admission based upon medical necessity as   noted below:     TIME OF EVALUATION:   8 p.m.    PRIMARY CARE PHYSICIAN:  Abbey Chattersaniel Bluestein, MD    CHIEF COMPLAINT:  Abdominal pain, chest pain.    HISTORY OF PRESENT ILLNESS:  This 64 year old female relates that for the last 2 weeks, she has had nearly   constant epigastric abdominal pain.  Concerning to her is that she had history   of gastric ulcer in her 3920s.  She presents for further evaluation primarily   due to this symptom.  She states she cannot eat or drink anything because it   makes her pain immediately worse and she will vomit.  She denies hematemesis.    She has had watery stool in the last 48 hours, but has  not looked at it to   see if there is any dark, tarry, or bloody stool.  The patient relates that   sometimes pain seems to radiate into the chest in the form of chest tightness.    States that this lasts 5 to 6 seconds with no provoking or relieving   factors.  No associated pleurisy.  She does state she feels more short of   breath in the last 48 hours, as well as more fatigued.    REVIEW OF SYSTEMS:  CONSTITUTIONAL:  No fevers or chills.  EYES:   No visual symptoms.  ENT:  No sore throat, runny nose, or other URI symptoms.  RESPIRATORY:  Dyspnea as above, persistently over the last 48 hours.  No   wheezing, no cough.  CARDIOVASCULAR:  Substernal chest tightness 5 to 6 seconds several times   today, with no provoking or relieving factors.  It is nonradiating except she   thinks it may be radiating from or to the epigastrium, but is not sure which.    No other pressure, palpitations, tightness or heaviness.  GASTROINTESTINAL:  Epigastric abdominal pain becoming more constant over  the   course of the last 2 weeks, associated with nausea, vomiting, dyspepsia and   watery stool of unknown color.  GENITOURINARY:  No dysuria, frequency, or urgency.  MUSCULOSKELETAL:  No joint pain or swelling.  INTEGUMENTARY:  No rashes.  NEUROLOGICAL:  No unilateral weakness, slurred speech, facial droop or altered   mental status.    PAST MEDICAL AND SURGICAL HISTORY:  Seizures, stroke, adjustment disorder, carotid aneurysm, cervicalgia, chronic   pain, coccydynia, fibromyalgia, hemiparesis, high cholesterol, osteoporosis,   hypothyroidism, seizures, autoimmune disease, pancreatitis, coronary disease   with cardiac stenting followed by Dr. Kristen Cardinal[place]ott[place], gastric ulcer in the   remote past in her 6320s, diabetes, normocytic anemia, vitamin D, hypertension,   GI bleeding in the past, chronic low back pain, osteoarthritis, lumbar   spondylosis, sensory ataxia, lupus, H. pylori gastritis, asthma, DVT, PE,   hypercapnic respiratory failure, lupus, history of cardiac stenting,   gastrectomy, cholecystectomy, hysterectomy and partial gastric resection.      SOCIAL HISTORY:   Former tobacco use.  No alcohol, no drug use.    FAMILY HISTORY:  Coronary disease.    ALLERGIES:  REVIEWED IN EPIC.  MEDICINES:  Reviewed in Epic.     PHYSICAL EXAMINATION:  VITAL SIGNS:  Blood pressure 114/71, pulse 79, respirations 16, temperature   98.1, pain 9 out of 10, O2 sats 96% on room air.  GENERAL APPEARANCE:  Patient appears well developed and well nourished.    Appearance and behavior are age and situation appropriate.  HEENT:  Eyes:  Conjunctivae clear, lids normal.  Pupils equal, symmetrical,   and normally reactive.  Ears/Nose:  Hearing is grossly intact to voice.    Internal and external examinations of the ears and nose are unremarkable.    Mouth/Throat:  Surfaces of the pharynx, palate, and tongue are pink, moist,   and without lesions.  Nasal mucosa, septum, and turbinates unremarkable.    Teeth and gums unremarkable.   RESPIRATORY:  Clear and equal breath sounds.  No respiratory distress,   tachypnea, or accessory muscle use.    CARDIOVASCULAR:   Heart regular, without murmurs, gallops, rubs, or thrills.    CHEST:  Chest symmetrical without masses or tenderness.   VASCULAR:  Calves soft, nontender.  No peripheral edema.  Dorsalis pedis and   radial pulses 2+ and equal.  GASTROINTESTINAL:  Abdomen tender to palpation in mid epigastrium reproducing   the patient's abdominal pain.  No other CVA, flank, or abdominal pain.  RECTAL:  No masses.  Nonthrombosed external hemorrhoid times 1.  Sphincter   tone normal.  Stool is slightly dark brown, guaiac negative; performed with RN   at bedside, Abigail Butts.    MUSCULOSKELETAL:  There is no localized cervical, thoracic, lumbar or sacral   body tenderness to palpation or fist percussion.  There are no bony step-offs,   ecchymosis, areas of soft tissue swelling or deformities.  No pain on range   of motion of the 4 extremities.  SKIN:  Warm and dry without rashes, jaundice or pallor.  NEUROLOGIC:  Alert, oriented.  Sensation intact, motor strength equal and   symmetric.  There is no facial asymmetry or dysarthria.  Cranial nerves II to   XII intact.      CONTINUATION BY Ellery Meroney TSUCHITANI, MD    RESULTS:  EKG showing a sinus rhythm, no acute ST elevation.  CBC:  White count 5.7,   hemoglobin 9.5, platelets 204.  Chemistry:  Sodium 146, chloride 108, calcium   8.3.  Troponin negative.  Lipase normal.  INR is low at 1.2.      Chest x-ray, read by the radiologist:  Lungs are clear.  Heart normal size,   surgical clips present.  The CT abdomen and pelvis read by Dr. Carron Curie, [facility]:  Significant biliary ductal dilatation more than expected for   post-cholecystectomy change, underlying radiolucent lesions versus stricture   not excluded, significant bladder distention, bilateral nonobstructing   calculus, large amount of stool in the colon.  CTA of the chest, read by the    radiologist from [facility], Dr. Kenney Houseman:  No acute findings,   unremarkable, no PE.  No evidence of right RV dysfunction, tiny pericardial   effusion.  EKG showing a sinus rhythm, no acute ST elevation.    ER COURSE:  The patient received IM Morphine as we had to call PICC team and there was a   delay in getting her IV.  She received p.o. Zofran and a liter of fluid.  She   has multiple complaints including chest pain, shortness of breath.  We   considered a PE because her INR  is low, she has history of thromboembolism so   CTA was performed.  Considered acute coronary syndrome because of her history.    She has some epigastric pain and had an EGD in May showing gastritis, H.   pylori, but due to persistent pain, a CT was performed.  She has normal LFTs.    Our plan is to admit for further workup of the chest pain and shortness of   breath.  She never looks at her stools.  She is a little more anemic.  Anibal Henderson is going to do a rectal exam which will be dictated and we will enter   the results into Epic.  Plan will be to discuss with [facility] admission.    DIAGNOSES:  1.  Chest pain.  2.  Dyspnea.  3.  Subtherapeutic INR.  4.  Epigastric pain.    ADDENDUM BY Mikhaela Zaugg TSUCHITANI, MD:      HISTORY OF PRESENT ILLNESS:  Anibal Henderson did a rectal exam.  Stool was dark brown, guaiac negative.    The patient to be given a dose of IV Protonix, and when we went to check on   her after all of her diagnostics, she was sleeping, looks more comfortable and   is admitted to Dr. Wenda Low, observation telemetry.      ___________________  Judeen Hammans Tsuchitani MD  Dictated By: Mearl Latin. Lockie Pares, PA-C    My signature above authenticates this document and my orders, the final   diagnosis (es), discharge prescription (s), and instructions in the Epic   record.  If you have any questions please contact [phone].    Nursing notes have been reviewed by the physician/ advanced practice   clinician.    MC  D:06/15/2015 00:17:52   T: 06/15/2015 00:44:11  [id number]

## 2015-06-15 NOTE — Progress Notes (Addendum)
Addendum to last note [facility] [employer] [facility]. Pending home health agency acceptance.    Tentative dc plan:   Home with home health -[facility]. Patient loaded to edischarge.    Anticipated Discharge Date:   10/15    PCP: [facility]      Specialists:   Dr. Tory EmeraldGotti -cardiology     Dialysis Unit/ chair time / access:   No    Pharmacy:   [facility]     DME:    Gilmer Morane for in the house and walker for outside     Home Environment:    [place]ives at [address]  [place] [id number]    [phone].     Prior to admission open services:     No    Home Health Agency-     No    Personal Care Agency-    No    Extended Emergency Contact Information  Primary Emergency Contact: Kerby LessEdwards,Gariel Lettrel  Address: [address]           [place], [place] [id number] [place]  Home Phone: [phone]  Relation: Daughter      Transportation:     Son will transport home    Therapy Recommendations:  OT :y/n     no  PT :y/n     pending  SLP :y/n    no    RT Home O2 Evaluation :y/n     No current oxygen sat on room air 100%    Wound Care: y/n     no    Change consult (formerly chamberlain) :    no    Case Management Assessment    ABUSE/NEGLECT SCREENING   Physical Abuse/Neglect: Denies   Sexual Abuse: Denies   Sexual Abuse: Denies   Other Abuse/Issues: Denies          PRIMARY DECISION MAKER                                   CARE MANAGEMENT INTERVENTIONS   Readmission Interview Completed: Not Applicable   PCP Verified by CM: Yes           Mode of Transport at Discharge: Self       Transition of Care Consult (CM Consult): Discharge Planning, Home Health       Reason Outside Comfort Care Chosen: Script used patient chose alternate agency           Physical Therapy Consult: Yes           Current Support Network: Lives Alone, Family Lives Nearby   Reason for Referral: DCP Rounds   History Provided By: Patient   Patient Orientation: Alert and Oriented    Cognition: Alert   Support System Response: Unavailable   Previous Living Arrangement: Lives with Family Independent       Prior Functional Level: Assistance with the following:, Mobility   Current Functional Level: Assistance with the following:, Mobility   Primary Language: English   Can patient return to prior living arrangement: Yes   Ability to make needs known:: Fair   Family able to assist with home care needs:: Yes               Types of Needs Identified: Disease Management Education, Treatment Education, Emotional Support       Confirm Follow Up Transport: Family  Plan discussed with Pt/Family/Caregiver: Yes   Freedom of Choice Offered: Yes      DISCHARGE LOCATION   Discharge Placement: Home with home health

## 2015-06-15 NOTE — Progress Notes (Signed)
[facility] Dosing [facility]:     Consult for Warfarin Dosing by Pharmacy by Dr. Claudie ReveringDonnell  Consult provided for this 64 y.o.  female , for indication of Venous Thrombosis   Dose to achieve an INR goal of 2-3    Order entered for  Warfarin  10 (mg) ordered to be given today at 18:00.     Significant drug interactions:   Previous dose given Warfarin 10 mg po daily   PT/INR Lab Results   Component Value Date/Time    INR 1.2 06/14/2015 09:30 PM      Platelets Lab Results   Component Value Date/Time    PLATELET 204 06/14/2015 09:30 PM      H/H Lab Results   Component Value Date/Time    HGB 9.5 06/14/2015 09:30 PM            Note - Will send page through [employer] for Lovenox or Heparin bridge until INR therapeutic        Pharmacy to follow daily and will provide subsequent Warfarin dosing based on clinical status.  Tressie StalkerJennifer Nicole Brandt, [facility])  Contact information [phone]

## 2015-06-15 NOTE — Progress Notes (Signed)
Problem: Falls - Risk of  Goal: *Absence of falls  Outcome: Progressing Towards Goal  No falls noted fall precaution maintained hourly rounding continues will monitor

## 2015-06-15 NOTE — Other (Signed)
Verbal shift change report given toChit RN (oncoming nurse) by Rozanna BoxKhristine RN (offgoing nurse). Report included the following information Kardex, Intake/Output, MAR, Recent Results and Cardiac Rhythm SR tele box 33.

## 2015-06-15 NOTE — ED Notes (Signed)
[facility] Pt is being admitted to rm CDU1

## 2015-06-15 NOTE — Other (Signed)
1:51 AM  06/15/15     Attempted to call report to RN (nurse) who is unable to take report at this time. Nurse states that she will call back in a few minutes.  Butch PennyKristen M. Gatling, RN

## 2015-06-15 NOTE — Progress Notes (Signed)
AFTER MIDNIGHT ADMISSION. H&P per Dr. Wenda LowMatriano. She has chest wall tenderness L anterior ribs. She's had one loose stool since admission. Will check labs am. D/C tom if stable.

## 2015-06-15 NOTE — H&P (Signed)
[facility]  Stat History and Physical  NAME:  [name], [name]  SEX:   F  ADMIT: 06/14/2015  DOB:06/01/1951  MR#    [id number]  ROOM:  CD01  ACCT#  [id number]    I hereby certify this patient for admission based upon medical necessity as   noted below:    <    DATE OF SERVICE:  06/15/2015    CHIEF COMPLAINT:  Chest pain, diarrhea.    HISTORY OF PRESENT ILLNESS:  The patient is a very pleasant 64 year old female who comes in because of   chest pain, diarrhea.  The patient states that over the past 2 weeks she has   had on and off chest tightness described as chest pain in the middle of her   chest.  She continued to have this intermittent chest pain.  Two days ago she   started to have several episodes of watery diarrhea.  Also started to have   nausea, epigastric discomfort radiating up her chest.  Because of all these   symptoms, she decided to go to ER.  When she arrived she had a CT scan of her   abdomen done and a CT scan of her chest.  Official results pending at this   time.  Initial troponin was 0.  EKG showed normal sinus rhythm.    PAST MEDICAL HISTORY:  1.  Coronary artery disease, status post stent times 3.  2.  Hypertension  3.  CVA.  4.  PE in 2009, DVT in 2011.  5.  Diabetes mellitus type 2, diet controlled.  6.  Anemia.  7.  Chronic pain.  8.  Gastric ulcer status post partial gastrectomy.  9.  Seizure disorder.  10.  Fibromyalgia.  11.  Vertigo.  12.  Thyroid disease.  13.  Lupus.    PAST SURGICAL HISTORY:  Cholecystectomy, hysterectomy, partial gastrectomy, lens implants.    ALLERGIES:  ASPIRIN, OPANA, TYLENOL.    HOME MEDICATIONS:  Lamictal 100 mg twice a day, Coumadin, Elavil 50 mg at night, simvastatin 80   mg daily, alendronate 70 mg weekly, Flexeril 10 mg every 8 hours as needed,   Benadryl, vitamin D, citalopram 20 mg daily, nitroglycerin.    SOCIAL HISTORY:  No alcohol or tobacco use.    FAMILY HISTORY:  Noncontributory.    REVIEW OF SYSTEMS:   A 12-point review of system done.  Pertinent positives and negatives stated in   HPI.    PHYSICAL EXAMINATION:  GENERAL APPEARANCE:  The patient in bed.  VITAL SIGNS:  Blood pressure 123/70, heart rate 68, respiratory rate 19,   temperature 98.4 degrees Fahrenheit, oxygen saturation 99% room air.  HEAD:  Normocephalic, atraumatic.  NECK:  Supple.  LUNGS:  Clear.  CARDIOVASCULAR:  Regular rhythm.  ABDOMEN:  Soft, mild epigastric tenderness.  No rebound, no guarding.  EXTREMITIES:  No edema.  NEUROLOGIC:  Awake, alert, oriented times 3, follows commands and moves all   extremities.    LABORATORY EXAMINATION:  WBC 5.7, hemoglobin 9, hematocrit 30, platelets 204.  INR 1.2.  Sodium 146,   potassium 3.9, chloride 108, bicarbonate 31, glucose 85, BUN 9, creatinine   0.7.  Occult blood stool negative.  Troponin 0.  Urinalysis:  Trace leukocyte   esterase, negative nitrites, 15 to 29 epithelial cells, 5 to 9 WBCs.  CT chest   and CT abdomen pending at this time.  EKG showed normal sinus rhythm.    ASSESSMENT AND PLAN:  The patient is  a very pleasant 64 year old female who comes in with chest   pain, diarrhea.  1.  Chest pain, rule out acute coronary syndrome.  Monitor cardiac enzymes,   telemetry, chest pain protocol. Follow up CT chest result.  2.  Diarrhea, rule out Clostridium difficile.  Check for Clostridium   difficile.  3.  History of deep venous thrombosis and pulmonary embolism with   subtherapeutic INR, warfarin to goal. Lovenox to bridge  4.  Seizure disorder on Lamictal.  5.  Chronic anemia.  Monitor hemoglobin, maintain above 7.  6.  Abdominal pain.  Followup CT abdomen result.  7.  Deep venous thrombosis prophylaxis.  The patient is on Coumadin.  8.  Further recommendations based on clinical course.  Discussed with patient   current assessment and plan and answered all her questions.    Thank you very much for allowing me to participate in the care of this very   pleasant patient.      ___________________   Knox RoyaltyJOHN P Arley Garant MD  Dictated By: .   RA  D:06/15/2015 04:42:34  T: 06/15/2015 05:12:59  [id number]

## 2015-06-15 NOTE — ED Notes (Signed)
Message sent to pharmacy to send me Protonix. There is not any in any of the pyxxis machines. I helped patient to the bathroom. She can ambulate with assistance. She uses a walker at home. I took her via wheelchair. She is steady and easy to transfer from wheelchair to toilet and stretcher.

## 2015-06-15 NOTE — ED Notes (Signed)
The nurse in CDU tried to call me but I was outside taking a patient to their car. I tried to call her now to give report and she is off the floor a the moment and will call me back.

## 2015-06-15 NOTE — Other (Signed)
Bedside and Verbal shift change report given to Khristine N Pascasio, RN   (oncoming nurse) by Gayle (offgoing nurse). Report included the following information SBAR, Kardex, Recent Results and Cardiac Rhythm SR.

## 2015-06-15 NOTE — Progress Notes (Cosign Needed)
[facility]  Face to Face Encounter    Patient???s Name: [name] CourseVeronica E Bonser           Date of Birth: 09/19/1950    Primary Diagnosis: chest pain                    Admit Date: 06/14/2015    Date of Face to Face:  June 15, 2015                    Medical Record Number: [id number]     Attending: Dorothe PeaBrian F O'Donnell, MD      Physician Attestation:  To be filled out by physician who conducted Face-to Face encounter.    Current Problem List:  The encounter with the patient was in whole, or in part, for the following medical condition, which is the primary reason home health care (list medical condition):    Patient Active Hospital Problem List:  No active hospital problems.                              Face to Face Encounter findings are related to primary reason for home care:   yes.     1. I certify that the patient needs intermittent care as follows: skilled nursing care:  skilled observation/assessment, patient education, complex care plan management and administration of medications  physical therapy: strengthening, stretching/ROM, transfer training, gait/stair training, balance training and pt/caregiver education    2. I certify that this patient is homebound, that is: 1) patient requires the use of a cane and walker device, special transportation, or assistance of another to leave the home; or 2) patient's condition makes leaving the home medically contraindicated; and 3) patient has a normal inability to leave the home and leaving the home requires considerable and taxing effort.  Patient may leave the home for infrequent and short duration for medical reasons, and occasional absences for non-medical reasons. Homebound status is due to the following functional limitations: Patient with strength deficits limiting the performance of all ADL's without caregiver assistance or the use of an assistive device.    3. I certify that this patient is under my care and that I, or a nurse  practitioner or physician???s assistant, or clinical nurse specialist, or certified nurse midwife, working with me, had a Face-to-Face Encounter that meets the physician Face-to-Face Encounter requirements.  The following are the clinical findings from the Face-to-Face encounter that support the need for skilled services and is a summary of the encounter:      See discharge summary    Electronically signed:  Inis SizerDonna S Bethea, RN  06/15/2015      THE FOLLOWING TO BE COMPLETED BY THE  PHYSICIAN:    I concur with the findings described above from the F2F encounter that this patient is homebound and in need of a skilled service.    Electronically signed:  Certifying Physician:

## 2015-06-16 LAB — CBC WITH AUTOMATED DIFF
BASOPHILS: 0.7 % (ref 0–3)
EOSINOPHILS: 5.9 % — ABNORMAL HIGH (ref 0–5)
HCT: 32.4 % — ABNORMAL LOW (ref 37.0–50.0)
HGB: 9.8 gm/dl — ABNORMAL LOW (ref 13.0–17.2)
IMMATURE GRANULOCYTES: 0.2 % (ref 0.0–3.0)
LYMPHOCYTES: 25.4 % — ABNORMAL LOW (ref 28–48)
MCH: 25.9 pg (ref 25.4–34.6)
MCHC: 30.2 gm/dl (ref 30.0–36.0)
MCV: 85.7 fL (ref 80.0–98.0)
MONOCYTES: 9 % (ref 1–13)
MPV: 10.2 fL — ABNORMAL HIGH (ref 6.0–10.0)
NEUTROPHILS: 58.8 % (ref 34–64)
NRBC: 0 (ref 0–0)
PLATELET: 189 10*3/uL (ref 140–450)
RBC: 3.78 M/uL (ref 3.60–5.20)
RDW-SD: 57.6 — ABNORMAL HIGH (ref 36.4–46.3)
WBC: 6.1 10*3/uL (ref 4.0–11.0)

## 2015-06-16 LAB — METABOLIC PANEL, COMPREHENSIVE
ALT (SGPT): 20 U/L (ref 12–78)
AST (SGOT): 25 U/L (ref 15–37)
Albumin: 3.3 gm/dl — ABNORMAL LOW (ref 3.4–5.0)
Alk. phosphatase: 94 U/L (ref 45–117)
BUN: 6 mg/dl — ABNORMAL LOW (ref 7–25)
Bilirubin, total: 0.6 mg/dl (ref 0.2–1.0)
CO2: 32 mEq/L (ref 21–32)
Calcium: 8.4 mg/dl — ABNORMAL LOW (ref 8.5–10.1)
Chloride: 105 mEq/L (ref 98–107)
Creatinine: 0.7 mg/dl (ref 0.6–1.3)
GFR est AA: >60
GFR est non-AA: >60
Glucose: 122 mg/dl — ABNORMAL HIGH (ref 74–106)
Potassium: 4.6 mEq/L (ref 3.5–5.1)
Protein, total: 6.4 gm/dl (ref 6.4–8.2)
Sodium: 141 mEq/L (ref 136–145)

## 2015-06-16 LAB — PROTHROMBIN TIME + INR
INR: 1.3 — ABNORMAL HIGH (ref 0.1–1.1)
Prothrombin time: 15.8 seconds — ABNORMAL HIGH (ref 11.5–14.0)

## 2015-06-16 LAB — GLUCOSE, POC
Glucose (POC): 152 mg/dL — ABNORMAL HIGH (ref 65–105)
Glucose (POC): 106 mg/dL — ABNORMAL HIGH (ref 65–105)
Glucose (POC): 105 mg/dL (ref 65–105)

## 2015-06-16 MED ORDER — WARFARIN 5 MG TAB
5 mg | ORAL_TABLET | ORAL | 0 refills | Status: DC
Start: 2015-06-16 — End: 2015-12-29

## 2015-06-16 MED ORDER — WARFARIN 5 MG TAB
5 mg | Freq: Once | ORAL | Status: DC
Start: 2015-06-16 — End: 2015-06-16

## 2015-06-16 MED FILL — CITALOPRAM 20 MG TAB: 20 mg | ORAL | Qty: 1

## 2015-06-16 MED FILL — OMEPRAZOLE 20 MG CAP, DELAYED RELEASE: 20 mg | ORAL | Qty: 1

## 2015-06-16 MED FILL — ONDANSETRON (PF) 4 MG/2 ML INJECTION: 4 mg/2 mL | INTRAMUSCULAR | Qty: 2

## 2015-06-16 MED FILL — MORPHINE 4 MG/ML SYRINGE: 4 mg/mL | INTRAMUSCULAR | Qty: 1

## 2015-06-16 MED FILL — ENOXAPARIN 60 MG/0.6 ML SUB-Q SYRINGE: 60 mg/0.6 mL | SUBCUTANEOUS | Qty: 0.6

## 2015-06-16 MED FILL — AMITRIPTYLINE 25 MG TAB: 25 mg | ORAL | Qty: 2

## 2015-06-16 MED FILL — LAMOTRIGINE 100 MG TAB: 100 mg | ORAL | Qty: 1

## 2015-06-16 NOTE — Discharge Summary (Signed)
[facility]  Discharge Summary   NAME:  [name], [name]  SEX:   F  ADMIT: 06/14/2015  DISCH: 06/16/2015  DOB:   Jun 21, 1951  MR#    [id number]  ACCT#  [id number]    cc: Lavera GuiseJasmine Finch , Regina EckMaruthi Gottimukkala MD    DATE OF ADMISSION:  06/14/2015.     DATE OF DISCHARGE:  06/16/2015.     Please refer to admission history and physical by Dr. Wenda LowMatriano for pertinent   past medical problems as well as details leading to this hospital stay.    Briefly, the patient is a 64 year old female with multiple medical problems as   noted below.  Of most significance is that she does have known coronary   artery disease, she is status post percutaneous coronary intervention in the   past.  Apparently her most recent stress test was within the last 6 months and   showed no ischemia.  She also has a history of DVT and pulmonary embolism,   and is on chronic Coumadin therapy.  Multiple other problems as noted below.    She presented to the emergency room complaining of 2 issues:  One, she had had   anterior chest wall pain over the previous week or so.  It was not pleuritic.    No associated shortness of breath.  No PND or orthopnea.  No leg swelling.    Also, complained of 2 days of loose, watery diarrhea, and concomitant nausea   and vomiting.  There was no melena or hematochezia.  No hematemesis.  Upon   presentation to the emergency room she had a temperature of 98.1, blood   pressure 114/70, pulse in the 70s, O2 sat was 96%.  On my examination she had   definite chest wall tenderness at the costochondral junction on the left   around ribs 3 through 5, and this reproduced her pain.  Her abdomen was soft   and nontender.    Initial laboratory studies showed WBC 5.7, hemoglobin 9.5, hematocrit 30.9 and   platelets 204.  A urinalysis was unremarkable.  INR (on Coumadin) was only   1.2.  Sodium 146, potassium 3.9, chloride 108, CO2 of 31, glucose 85, BUN 9,    creatinine 0.7, ALT 18, AST 28, alkaline phosphatase 85.  Lipase 93.  Troponin   I 0.  Stool was negative for occult blood.  CT angiogram of the chest showed   no evidence of pulmonary embolism.  No infiltrate.  CT of the abdomen and   pelvis showed \\"1.  1.6 to 2.1 cm common bile duct dilatation and central   intrahepatic bile duct dilatation, unchanged from 04/06/2015.  2.    Cholecystectomy, hysterectomy, partial gastrectomy.\\"  She was admitted for   further evaluation of chest pain in patient with known coronary disease as   well as diarrhea, probable gastroenteritis.    Again, on my examination the chest pain was reproducible on palpation of the   anterior chest wall.  Felt to be consistent with costochondritis.  We did   check serial cardiac isoenzymes, which remained undetectable.  EKG showed no   ischemic changes.  She was monitored on telemetry for 36 hours and showed no   dysrhythmia.  She had had a recent echocardiogram 05/18/2015 which showed   normal systolic LV EF of 60% and no significant valvular abnormalities, and we   did not do any further cardiac testing.    As far as the diarrhea, she had one  more loose stool after admission and then   no further diarrhea.  Early on admission she did have a couple episodes of   emesis which has resolved.    Currently she has been afebrile throughout her hospital stay with stable vital   signs.  She still has some reproducible anterior chest wall pain.  She has   had no further diarrhea.  She is tolerating p.o. without any difficulty.  It   is not felt that she needs further inpatient care at this time.    RECENT LABORATORY STUDIES:    06/17/2015:  WBC 6.1, hemoglobin 9.8, hematocrit 32.4, platelets 189.  INR was   1.3 (she will go home on 10 mg Coumadin for the next 2 days and then she   needs an INR check).  06/16/2015:  Sodium 141, potassium 4.6, chloride 105,   CO2 of 32, glucose 122, BUN 6, creatinine 0.7, ALT 20, AST 25, alkaline   phosphatase 94.     DISCHARGE DIAGNOSES:  1.  Chest pain, suspect costochondritis as it is reproducible.  2.  Acute gastroenteritis, resolved.  3.  Coronary artery disease, status post percutaneous coronary intervention in   the distant past.  4.  Hypertension.  5.  History of deep venous thrombosis/pulmonary embolism, on chronic Coumadin.  6.  History of cerebrovascular accident.  7.  Type 2 diabetes, diet controlled.  8.  Chronic pain syndrome.  9.  Anxiety/depression.  10.  Seizure disorder.  11.  History of gastric ulcer, status post partial gastrectomy.  12.  Fibromyalgia.  13.  Questionable history of lupus.  14.  Osteoporosis.  15.  Dyslipidemia.     MEDICATIONS ON DISCHARGE:  Coumadin should be 10 mg on 10/15 and 10/16, and then she needs an INR checked   on 10/17 and Coumadin adjusted to keep INR in the 2 to 3 range.  She will   also resume her usual outpatient medications.  I see that she is on both   Celexa 20 mg daily and Prozac 40 mg daily, I confirmed this with her, she says   she is on both of these, I did ask her to double confirm this with her   primary care physician who has prescribed it.  She will also continue   amitriptyline 50 mg daily, albuterol and Atrovent nebs p.r.n., Fosamax with   vitamin D 70 mg once a week, Flexeril 10 mg t.i.d. p.r.n. muscle spasm,   Benadryl 25 mg q.6 h. p.r.n., iron 325 mg b.i.d., Lamictal 100 mg b.i.d.,   oxycodone IR 15 mg q.6 h. p.r.n. pain, albuterol inhaler p.r.n., Zocor 80 mg   daily, vitamin D 50,000 units once a week.    She should follow up with Dr. Dorothyann Gibbs in approximately a week and, again, should   follow up for an INR check in 2 days.  Follow up with Dr. Kristen Cardinal in 2   to 4 weeks.      ___________________  Charisse Klinefelter MD  Dictated By: .   NS  D:06/16/2015 11:43:34  T: 06/16/2015 15:24:03  [id number]

## 2015-06-16 NOTE — Progress Notes (Signed)
Problem: Falls - Risk of  Goal: *Absence of falls  Outcome: Progressing Towards Goal  Fall precautions maintained. Pt free from any falls this shift. Will continue to monitor. Call bell within reach.

## 2015-06-16 NOTE — Other (Signed)
Bedside and Verbal shift change report given to Valere DrossEmily L Speakman, RN (oncoming nurse) by Reather Laurencehit RN (offgoing nurse). Report included the following information SBAR, Kardex, Intake/Output, MAR, Recent Results and Cardiac Rhythm NSR.

## 2015-06-16 NOTE — Progress Notes (Signed)
Discharge Plan:   Home with home health [facility]    Discharge Date:     06/16/2015     Assisted Living Facility:    No    The facility confirmed that they are ready to receive the patient:     Yes/No; the CM spoke with     No    Home Health Needed:     Yes RN    Home Health Agency:    [facility]     Confirmed start of care with the home health agency and spoke with:      Boneta LucksJenny    DME needed and ordered for Discharge:    No    DME Company:    No    CM confirmed delivery of DME: with      No    TCC Referral:     No    Medication Assistance given     Y/N       meds given (please list)     No    Change(MDC/SSDI) Referral/outcome    No     Transportation: Family/ Medical    Family    Transport Time:    anytime    Family, MD, patient, nurse and facility aware of pickup time    yes

## 2015-06-16 NOTE — Progress Notes (Signed)
Pharmacist  note, warfarin dosing service:    INR today : 1.3   Goal INR  : 2-3 , check daily  Warfarin :  10   mg  x 1 dose   today  Thank you, pharmacy will follow  Kendra L. Mayes PharmD, BCPS

## 2015-06-16 NOTE — Progress Notes (Incomplete)
Problem: Falls - Risk of  Goal: *Absence of falls  Outcome: Progressing Towards Goal  Pt will not sustain fall

## 2015-07-17 ENCOUNTER — Emergency Department: Admit: 2015-07-18 | Payer: MEDICAID | Primary: Internal Medicine

## 2015-07-17 DIAGNOSIS — R531 Weakness: Principal | ICD-10-CM

## 2015-07-17 NOTE — ED Triage Notes (Signed)
Family reports cva symptoms x 2 weeks.  Pt minimally responsive to pain.

## 2015-07-17 NOTE — ED Provider Notes (Addendum)
[facility]  Emergency Department Treatment Report    Patient: [name] [name] Age: 63 y.o. Sex: female    Date of Birth: 05/13/1951 Admit Date: 07/17/2015 PCP: Monte Fantasia, MD   MRN: [id number]  CSN: [id number]     Room: ER23/ER23           Chief Complaint      Chief Complaint   Patient presents with   ??? Extremity Weakness   ??? Altered mental status       History of Present Illness   64 y.o. female notes that she's been having some dizziness for 2 weeks. She states that today she was unable to ambulate. She does not feel like she has control of her extremities. She is having difficulty speaking. She is having difficulty maintaining conversation. She also has been dealing with some chronic neck pain. She states that she's been referred to a spinal surgeon. She also complains of heaviness in her chest, she feels like she cannot speak for very long or she becomes short of breath. Denies chest pain    Review of Systems   Constitutional:  Denies malaise, fever, chills.   Head:  Denies injury.   Face:  No injury or pain.   ENT:  No congestion, difficulty swallowing, sore throat.   Neck:  No injury or pain.   Cardiac:  Denies chest pain, edema, or palpitations.   Respiratory:  Denies cough, wheezing.   Chest:  No injury.   GI/ABD:  Denies pain, distention, nausea, vomiting, diarrhea.   GU:  Denies injury, pain, dysuria, urgency, frequency, or discharge.   Back: No injury or pain.   Extremity/MS:  No injury or pain, swelling or loss of motion.   Neuro:  Denies headache, LOC, dizziness, paresthesias, dysarthria, ataxia, tinnitus, dysphagia, or syncope    Skin: Denies injury, rash, itching, ecchymosis or skin changes.   All other ROS negative.     Past Medical/Surgical History     Past Medical History   Diagnosis Date   ??? Acute hypercapnic respiratory failure ([place]) 02/05/2013     W/ Encephalopathy requiring BiPAP, Etiology Uncertain however Possible Narcotic Benzodiazipine Related?     ??? Adjustment disorder with mixed anxiety and depressed mood    ??? Aneurysm of internal carotid artery    ??? Arthritis of knee    ??? Asthma    ??? Autoimmune disease ([place])    ??? CAD (coronary artery disease)    ??? Cervicalgia    ??? Chest pain 03/17/2014     Dobutamine Stress Echo: NORMAL WALL MOTION AND GLOBAL SYSTOLIC FUNCTION AT REST AND AFTER?? STRESS/EXERCISE WITH APPROPRIATE AUGMENTATION OF FUNCTION IN ALL SEGMENTS. NO EVIDENCE OF INDUCIBLE ISCHEMIA AT ADEQUATE HEART RATE WITH DOBUTAMINE STRESS.??            ??? Chronic low back pain 10/22/2014     [facility] MRI Lumbar w/o contrast: Moderate facet arthropathy at L4-L5 and L5-S1. Mild/moderate foraminal stenosis at these levels. Disc bulge with annular tear at L4-L5.    ??? Chronic pain syndrome    ??? Coccydynia    ??? Diabetes mellitus      NIDDM   ??? DVT (deep venous thrombosis) ([place]) 05/19/2007     [facility] CTA: 1. Small nonocclusive pulmonary embolus in a subsegmental branch to the left lower lobe. Larger filling defect in a segmental branch to the right lower lobe, not as low in attenuation as is normally seen with a pulmonary embolus, however is seen in  multiple planes and remains concerning for an additional nonocclusive pulmonary embolus.   ??? Fibromyalgia    ??? Gastric ulcer    ??? Gastritis 01/03/2015     [facility] [facility] Admission, EGD + H. Pylori/Gastritis   ??? Generalized osteoarthritis of multiple sites    ??? GI bleed 01/01/2015     [facility] Admit 5/2-01/07/2015   ??? H. pylori infection 01/03/2015     [facility] Admission, EGD + H. Pylori/Gastritis   ??? Hemiparesis, right ([place])    ??? Hyperlipidemia    ??? Hypertension    ??? Irregular sleep-wake rhythm    ??? Knee joint pain    ??? Lumbar spondylosis      Orthopedic Spine Surgeon: Dr. Barnabas Lister L. Siegel    ??? Lupus ([place])    ??? Muscle spasm    ??? Normocytic anemia      Chronic Iron Deficiency Anemia   ??? Osteoporosis    ??? Pancreatitis    ??? Peripheral neuropathy ([place])    ??? Pulmonary embolism ([facility])      Multiple   ??? Seizure ([place])    ??? Seizures ([place])    ??? Sensory ataxia     ??? SLE (systemic lupus erythematosus) ([place])    ??? Stroke [facility])    ??? Subclinical hypothyroidism    ??? Vitamin D deficiency      Past Surgical History   Procedure Laterality Date   ??? Hx hysterectomy     ??? Hx cholecystectomy     ??? Pr cardiac surg procedure unlist       Cardiac Cath PCI w/ Stents   ??? Hx gi       Partial Gastrectomy    ??? Hx gi  01/03/2015     [facility] EGD by Dr. Darnell Level D. [place]oltz: S/P B-2 Gastric Resection, Gastritis in small remnant, Bx of Jejunum r/o Celiac Disease and of Stomach. + Gastritis + H. Pylori       Social History     Social History     Social History   ??? Marital status: DIVORCED     Spouse name: N/A   ??? Number of children: N/A   ??? Years of education: N/A     Social History Main Topics   ??? Smoking status: Former Smoker   ??? Smokeless tobacco: Never Used   ??? Alcohol use No   ??? Drug use: No   ??? Sexual activity: Not Asked     Other Topics Concern   ??? None     Social History Narrative       Family History     Family History   Problem Relation Age of Onset   ??? Diabetes Maternal Grandmother        Current Medications     Current Facility-Administered Medications   Medication Dose Route Frequency Provider Last Rate Last Dose   ??? morphine injection 4 mg  4 mg IntraVENous NOW Harmony J Madden, PA-C       ??? ondansetron (ZOFRAN) injection 4 mg  4 mg IntraVENous ONCE Alvino Blood, PA-C         Current Outpatient Prescriptions   Medication Sig Dispense Refill   ??? warfarin (COUMADIN) 5 mg tablet Take 2 tablets (22m) daily on 10/15, 10/16 & have INR checked 10/17 1 Tab 0   ??? citalopram (CELEXA) 20 mg tablet Take 20 mg by mouth daily.     ??? cyclobenzaprine (FLEXERIL) 5 mg tablet Take 10 mg by mouth three (3) times daily as needed for  Muscle Spasm(s).     ??? simvastatin (ZOCOR) 80 mg tablet Take 80 mg by mouth nightly.     ??? diphenhydrAMINE (BENADRYL) 25 mg capsule Take 25 mg by mouth every six (6) hours as needed.     ??? FLUoxetine (PROZAC) 40 mg capsule Take 1 Cap by mouth daily. 30 Cap 0    ??? lamoTRIgine (LAMICTAL) 100 mg tablet Take 1 Tab by mouth two (2) times a day. 60 Tab 0   ??? oxyCODONE IR (OXY-IR) 15 mg immediate release tablet Take 15 mg by mouth four (4) times daily.     ??? ferrous gluconate 324 mg (36 mg iron) tab Take 1 Tab by mouth Before breakfast and dinner. 60 Tab 2   ??? alendronate-vitamin d3 70-2,800 mg-unit per tablet Take 1 Tab by mouth every seven (7) days.     ??? amitriptyline (ELAVIL) 75 mg tablet Take 1 Tab by mouth nightly. (Patient taking differently: Take 50 mg by mouth nightly.) 30 Tab 1   ??? albuterol (PROVENTIL HFA) 90 mcg/Actuation inhaler Take 1 Puff by inhalation as needed.     ??? albuterol-ipratropium (DUO-NEB) 0.5 mg-3 mg(2.5 mg base)/3 mL nebulizer solution 3 mL by Nebulization route once.     ??? Cholecalciferol, Vitamin D3, (VITAMIN D) 1,000 unit Cap Take 50,000 Units by mouth every seven (7) days.       Allergies     Allergies   Allergen Reactions   ??? Aspirin Not Reported This Time     Because of hx ulcers per patient   ??? Opana [Oxymorphone] Rash and Itching   ??? Tylenol [Acetaminophen] Itching     Physical Exam     Visit Vitals   ??? BP 135/78   ??? Pulse 90   ??? Temp 98.4 ??F (36.9 ??C)   ??? Resp 16   ??? Ht _0  (1.676 m)   ??? Wt 65.8 kg (145 lb)   ??? SpO2 96%   ??? BMI 23.4 kg/m2       CONSTITUTIONAL: Well-appearing.  Well-nourished.  In no apparent distress.  HEAD: Normocephalic and atraumatic.  EYES: PERRL.  EOM intact. Visual fields tested and intact bilaterally.  ENT: Normal nose.  No rhinorrhea.  Normal pharynx with no tonsillar hypertrophy, erythema, or exudate.   Mucus membranes moist.    NECK: Supple with no evidence of meningismus.  Non-tender along midline with no step-offs.   CHEST:  No evidence of trauma or deformity.  Non-tender to palpation.  No crepitus or paradoxical movements.  Chest excursion is normal.  CARDIOVASCULAR: Normal S1, S2; no murmurs, rubs, or gallops.  Radial, femoral, and pedal pulses are present and symmetric.    PERIPHERAL VASCULAR: No calf tenderness,  brisk capillary refill.  RESPIRATORY: Normal chest excursion with respiration.  Breath sounds clear and equal bilaterally.  No wheezes, rhonchi, or rales.  GI: Bowel sounds present. Non-distended.  Non-tender to deep palpation throughout abdomen.  No guarding.  Negative Murphy???s sign.  No palpable organomegaly. There are no abdominal bruits.  There is no palpable pulsatile abdominal mass.  BACK:  No evidence of trauma, pain or deformity.  Non-tender to palpation along entirety of midline.    PELVIS:  No evidence of trauma or deformity.  EXT: There are no deformities noted in all four extremities.  There is full ROM in all extremity joints.  There is no bony or joint tenderness to palpation.  Pulses are equal bilaterally. No calf tenderness, erythema, or size asymmetry  SKIN: Normal for  age and race; warm; dry; good turgor; no apparent lesions or exudate.  NEURO: NIHSS  12 extremities all drop little resistance against gravity. No facial asymmetries. She has dysarthria. Ataxia in finger to nose testing and heel to shin. Difficulty maintaining concentration in her conversation. Nonspecific vision loss. She is alert and oriented and able to really past medical history with some speech hesitancy.   PSYCHOLOGICAL:  The patient???s mood and manner are appropriate.  Appropriate grooming and personal hygiene.  No apparent thoughts of harm to self or others.      Impression and Management Plan   Nursing notes were reviewed. Past medical history was reviewed. Neurologic deficit hard to say if this is new patient reports that the ataxia is new. Workup was initiated to determine neurologic, metabolic, infectious source. Discussed with Dr. Jerelyn Charles      Diagnostic Studies   Lab:   Recent Results (from the past 12 hour(s))   GLUCOSE, POC    Collection Time: 07/17/15  8:51 PM   Result Value Ref Range    Glucose (POC) 109 (H) 65 - 105 mg/dL   POC CHEM8    Collection Time: 07/17/15  8:55 PM    Result Value Ref Range    Sodium 145 136 - 145 mEq/L    Potassium 3.8 3.5 - 4.9 mEq/L    Chloride 108 (H) 98 - 107 mEq/L    CO2, TOTAL 22 21 - 32 mmol/L    Glucose 60 (L) 74 - 106 mg/dL    BUN 11 7 - 25 mg/dl    Creatinine 0.9 0.6 - 1.3 mg/dl    HCT 32 (L) 38 - 45 %    HGB 10.9 (L) 12.4 - 17.2 gm/dl    CALCIUM,IONIZED 4.60 4.40 - 5.40 mg/dL   CBC W/O DIFF    Collection Time: 07/17/15  8:56 PM   Result Value Ref Range    WBC 5.8 4.0 - 11.0 1000/mm3    RBC 3.54 (L) 3.60 - 5.20 M/uL    HGB 9.1 (L) 13.0 - 17.2 gm/dl    HCT 30.1 (L) 37.0 - 50.0 %    MCV 85.0 80.0 - 98.0 fL    MCH 25.7 25.4 - 34.6 pg    MCHC 30.2 30.0 - 36.0 gm/dl    PLATELET 194 140 - 450 1000/mm3    MPV 10.6 (H) 6.0 - 10.0 fL    RDW-SD 61.2 (H) 36.4 - 16.1     METABOLIC PANEL, COMPREHENSIVE    Collection Time: 07/17/15  8:56 PM   Result Value Ref Range    Sodium 145 136 - 145 mEq/L    Potassium 3.8 3.5 - 5.1 mEq/L    Chloride 114 (H) 98 - 107 mEq/L    CO2 24 21 - 32 mEq/L    Glucose 63 (L) 74 - 106 mg/dl    BUN 11 7 - 25 mg/dl    Creatinine 0.8 0.6 - 1.3 mg/dl    GFR est AA >60.0      GFR est non-AA >60      Calcium 8.1 (L) 8.5 - 10.1 mg/dl    AST 27 15 - 37 U/L    ALT 21 12 - 78 U/L    Alk. phosphatase 87 45 - 117 U/L    Bilirubin, total 0.1 (L) 0.2 - 1.0 mg/dl    Protein, total 6.6 6.4 - 8.2 gm/dl    Albumin 3.4 3.4 - 5.0 gm/dl       Imaging:  Xr Chest Sngl V    Result Date: 07/17/2015  AP chest. HISTORY: Shortness of breath. FINDINGS: Cardiac silhouette is normal. The lungs clear of any acute process. No effusions. Bony structures intact.     IMPRESSION: Negative chest x-ray.     Ct Head Wo Cont    Result Date: 07/17/2015  Noncontrast head CT. Comparison 04/06/2015. HISTORY: CVA. TECHNIQUE: Noncontrast study. FINDINGS: Ventricles normal in size and position. No intracranial hemorrhage or acute infarct. Bony calvarium intact.     IMPRESSION: No acute intracranial abnormalities.     Continuation by Dr. Dennie Bible   EKG shows a normal sinus rhythm with a rate of 94. There is no evidence of ischemia or infarction on this EKG.    ED Course/MDM   Patient hemodynamically stable throughout her stay in the ED. Patient's NIH stroke scale in excess of 12. Patient's INR is 4.4 and therefore aspirin wasn't initially withheld. Patient's care discussed with Dr. Dawson Bills the hospitalist group. Stroke team has been notified. Patient will be admitted and likely her eventual disposition will be to a nursing home or home health nursing.    Final Diagnosis   Acute CVA     Disposition   Inpatient telemetry stable          Patient was seen and examined by Alvino Blood, PA-C under the supervision of Dr. Jerelyn Charles- Brenae Lasecki     July 17, 2015      Documentation performed using voice recognition software.  My signature above authenticates this document and my orders, the final ??  diagnosis (es), discharge prescription (s), and instructions in the Epic ??  record.  If you have any questions please contact [phone].  ??

## 2015-07-18 ENCOUNTER — Inpatient Hospital Stay: Payer: MEDICAID | Primary: Internal Medicine

## 2015-07-18 ENCOUNTER — Observation Stay
Admit: 2015-07-18 | Discharge: 2015-07-20 | Disposition: A | Discharge status: Home Health | Payer: MEDICAID | Attending: Hospitalist | Admitting: Hospitalist

## 2015-07-18 LAB — METABOLIC PANEL, COMPREHENSIVE
ALT (SGPT): 21 U/L (ref 12–78)
ALT (SGPT): 35 U/L (ref 12–78)
AST (SGOT): 27 U/L (ref 15–37)
AST (SGOT): 73 U/L — ABNORMAL HIGH (ref 15–37)
Albumin: 3.4 gm/dl (ref 3.4–5.0)
Albumin: 3.1 gm/dl — ABNORMAL LOW (ref 3.4–5.0)
Alk. phosphatase: 87 U/L (ref 45–117)
Alk. phosphatase: 94 U/L (ref 45–117)
BUN: 11 mg/dl (ref 7–25)
BUN: 13 mg/dl (ref 7–25)
Bilirubin, total: 0.1 mg/dl — ABNORMAL LOW (ref 0.2–1.0)
Bilirubin, total: 0.3 mg/dl (ref 0.2–1.0)
CO2: 24 mEq/L (ref 21–32)
CO2: 26 mEq/L (ref 21–32)
Calcium: 8.1 mg/dl — ABNORMAL LOW (ref 8.5–10.1)
Calcium: 7.8 mg/dl — ABNORMAL LOW (ref 8.5–10.1)
Chloride: 114 mEq/L — ABNORMAL HIGH (ref 98–107)
Chloride: 110 mEq/L — ABNORMAL HIGH (ref 98–107)
Creatinine: 0.8 mg/dl (ref 0.6–1.3)
Creatinine: 0.7 mg/dl (ref 0.6–1.3)
GFR est AA: >60
GFR est AA: >60
GFR est non-AA: >60
GFR est non-AA: >60
Glucose: 63 mg/dl — ABNORMAL LOW (ref 74–106)
Glucose: 80 mg/dl (ref 74–106)
Potassium: 3.8 mEq/L (ref 3.5–5.1)
Potassium: 4.4 mEq/L (ref 3.5–5.1)
Protein, total: 6.6 gm/dl (ref 6.4–8.2)
Protein, total: 6 gm/dl — ABNORMAL LOW (ref 6.4–8.2)
Sodium: 145 mEq/L (ref 136–145)
Sodium: 143 mEq/L (ref 136–145)

## 2015-07-18 LAB — EKG, 12 LEAD, INITIAL
Atrial Rate: 94 {beats}/min
Calculated P Axis: 78 degrees
Calculated R Axis: 71 degrees
Calculated T Axis: 42 degrees
P-R Interval: 152 ms
Q-T Interval: 360 ms
QRS Duration: 88 ms
QTC Calculation (Bezet): 450 ms
Ventricular Rate: 94 {beats}/min

## 2015-07-18 LAB — CBC WITH AUTOMATED DIFF
BASOPHILS: 0.5 % (ref 0–3)
EOSINOPHILS: 3.5 % (ref 0–5)
HCT: 28.3 % — ABNORMAL LOW (ref 37.0–50.0)
HGB: 8.6 gm/dl — ABNORMAL LOW (ref 13.0–17.2)
IMMATURE GRANULOCYTES: 0.2 % (ref 0.0–3.0)
LYMPHOCYTES: 27.9 % — ABNORMAL LOW (ref 28–48)
MCH: 25.4 pg (ref 25.4–34.6)
MCHC: 30.4 gm/dl (ref 30.0–36.0)
MCV: 83.5 fL (ref 80.0–98.0)
MONOCYTES: 8.2 % (ref 1–13)
MPV: 10.3 fL — ABNORMAL HIGH (ref 6.0–10.0)
NEUTROPHILS: 59.7 % (ref 34–64)
NRBC: 0 (ref 0–0)
PLATELET: 176 10*3/uL (ref 140–450)
RBC: 3.39 M/uL — ABNORMAL LOW (ref 3.60–5.20)
RDW-SD: 60.3 — ABNORMAL HIGH (ref 36.4–46.3)
WBC: 5.5 10*3/uL (ref 4.0–11.0)

## 2015-07-18 LAB — POC CHEM8
BUN: 11 mg/dl (ref 7–25)
CALCIUM,IONIZED: 4.6 mg/dL (ref 4.40–5.40)
CO2, TOTAL: 22 mmol/L (ref 21–32)
Chloride: 108 mEq/L — ABNORMAL HIGH (ref 98–107)
Creatinine: 0.9 mg/dl (ref 0.6–1.3)
Glucose: 60 mg/dL — ABNORMAL LOW (ref 74–106)
HCT: 32 % — ABNORMAL LOW (ref 38–45)
HGB: 10.9 gm/dl — ABNORMAL LOW (ref 12.4–17.2)
Potassium: 3.8 mEq/L (ref 3.5–4.9)
Sodium: 145 mEq/L (ref 136–145)

## 2015-07-18 LAB — LIPID PANEL
CHOL/HDL Ratio: 2.4 Ratio (ref 0.0–4.4)
Cholesterol, total: 171 mg/dl (ref 140–199)
HDL Cholesterol: 70 mg/dl (ref 40–96)
LDL, calculated: 93 mg/dl (ref 0–130)
Triglyceride: 38 mg/dl (ref 29–150)

## 2015-07-18 LAB — PROTHROMBIN TIME + INR
INR: 4.4 — CR (ref 0.1–1.1)
INR: 4.7 — CR (ref 0.1–1.1)
Prothrombin time: 40.7 seconds — ABNORMAL HIGH (ref 11.5–14.0)
Prothrombin time: 43.2 seconds — ABNORMAL HIGH (ref 11.5–14.0)

## 2015-07-18 LAB — CBC W/O DIFF
HCT: 30.1 % — ABNORMAL LOW (ref 37.0–50.0)
HGB: 9.1 gm/dl — ABNORMAL LOW (ref 13.0–17.2)
MCH: 25.7 pg (ref 25.4–34.6)
MCHC: 30.2 gm/dl (ref 30.0–36.0)
MCV: 85 fL (ref 80.0–98.0)
MPV: 10.6 fL — ABNORMAL HIGH (ref 6.0–10.0)
PLATELET: 194 10*3/uL (ref 140–450)
RBC: 3.54 M/uL — ABNORMAL LOW (ref 3.60–5.20)
RDW-SD: 61.2 — ABNORMAL HIGH (ref 36.4–46.3)
WBC: 5.8 10*3/uL (ref 4.0–11.0)

## 2015-07-18 LAB — GLUCOSE, POC
Glucose (POC): 109 mg/dL — ABNORMAL HIGH (ref 65–105)
Glucose (POC): 111 mg/dL — ABNORMAL HIGH (ref 65–105)
Glucose (POC): 119 mg/dL — ABNORMAL HIGH (ref 65–105)
Glucose (POC): 98 mg/dL (ref 65–105)

## 2015-07-18 LAB — LACTIC ACID: Lactic Acid: 0.7 mmol/L (ref 0.4–2.0)

## 2015-07-18 LAB — TROPONIN I: Troponin-I: <0.015 ng/ml (ref 0.000–0.045)

## 2015-07-18 LAB — POC TROPONIN: Troponin-I: 0 ng/ml (ref 0.00–0.07)

## 2015-07-18 LAB — TSH 3RD GENERATION: TSH: 5.47 u[IU]/mL — ABNORMAL HIGH (ref 0.358–3.740)

## 2015-07-18 LAB — HEMOGLOBIN A1C W/O EAG: Hemoglobin A1c: 6.1 % — ABNORMAL HIGH (ref 4.8–6.0)

## 2015-07-18 MED ORDER — SODIUM CHLORIDE 0.9 % IJ SYRG
INTRAMUSCULAR | Status: DC | PRN
Start: 2015-07-18 — End: 2015-07-18

## 2015-07-18 MED ORDER — SODIUM CHLORIDE 0.9 % IJ SYRG
INTRAMUSCULAR | Status: AC
Start: 2015-07-18 — End: 2015-07-18
  Administered 2015-07-18: 17:00:00

## 2015-07-18 MED ORDER — ONDANSETRON (PF) 4 MG/2 ML INJECTION
4 mg/2 mL | INTRAMUSCULAR | Status: AC
Start: 2015-07-18 — End: 2015-07-18
  Administered 2015-07-18: 09:00:00

## 2015-07-18 MED ORDER — NALOXONE 0.4 MG/ML INJECTION
0.4 mg/mL | INTRAMUSCULAR | Status: DC | PRN
Start: 2015-07-18 — End: 2015-07-20

## 2015-07-18 MED ORDER — DIPHENHYDRAMINE 25 MG CAP
25 mg | ORAL | Status: AC
Start: 2015-07-18 — End: 2015-07-18
  Administered 2015-07-18: 09:00:00

## 2015-07-18 MED ORDER — GLUCAGON 1 MG INJECTION
1 mg | INTRAMUSCULAR | Status: DC | PRN
Start: 2015-07-18 — End: 2015-07-20

## 2015-07-18 MED ORDER — MORPHINE 2 MG/ML INJECTION
2 mg/mL | INTRAMUSCULAR | Status: DC | PRN
Start: 2015-07-18 — End: 2015-07-20
  Administered 2015-07-18 – 2015-07-20 (×9): via INTRAVENOUS

## 2015-07-18 MED ORDER — SODIUM CHLORIDE 0.9 % IJ SYRG
Freq: Three times a day (TID) | INTRAMUSCULAR | Status: DC
Start: 2015-07-18 — End: 2015-07-20
  Administered 2015-07-18 – 2015-07-20 (×7): via INTRAVENOUS

## 2015-07-18 MED ORDER — ALUM-MAG HYDROXIDE-SIMETH 200 MG-200 MG-20 MG/5 ML ORAL SUSP
200-200-20 mg/5 mL | ORAL | Status: DC | PRN
Start: 2015-07-18 — End: 2015-07-20

## 2015-07-18 MED ORDER — OXYCODONE 15 MG TAB
15 mg | Freq: Four times a day (QID) | ORAL | Status: DC | PRN
Start: 2015-07-18 — End: 2015-07-20
  Administered 2015-07-18: 15:00:00 via ORAL

## 2015-07-18 MED ORDER — IPRATROPIUM-ALBUTEROL 2.5 MG-0.5 MG/3 ML NEB SOLUTION
2.5 mg-0.5 mg/3 ml | Freq: Once | RESPIRATORY_TRACT | Status: DC
Start: 2015-07-18 — End: 2015-07-18

## 2015-07-18 MED ORDER — DEXTROSE 50% IN WATER (D50W) IV SYRG
INTRAVENOUS | Status: DC | PRN
Start: 2015-07-18 — End: 2015-07-20

## 2015-07-18 MED ORDER — OXYCODONE 15 MG TAB
15 mg | ORAL | Status: AC
Start: 2015-07-18 — End: 2015-07-18
  Administered 2015-07-18: 08:00:00

## 2015-07-18 MED ORDER — MORPHINE 4 MG/ML SYRINGE
4 mg/mL | INTRAMUSCULAR | Status: AC
Start: 2015-07-18 — End: 2015-07-17
  Administered 2015-07-18: 04:00:00 via INTRAVENOUS

## 2015-07-18 MED ORDER — ONDANSETRON (PF) 4 MG/2 ML INJECTION
4 mg/2 mL | Freq: Four times a day (QID) | INTRAMUSCULAR | Status: DC | PRN
Start: 2015-07-18 — End: 2015-07-20
  Administered 2015-07-18 – 2015-07-19 (×2): via INTRAVENOUS

## 2015-07-18 MED ORDER — PROMETHAZINE IN NS 25 MG/50 ML IV PIGGY BAG
25 mg/50 ml | INTRAVENOUS | Status: DC | PRN
Start: 2015-07-18 — End: 2015-07-20
  Administered 2015-07-18 – 2015-07-20 (×7): via INTRAVENOUS

## 2015-07-18 MED ORDER — ONDANSETRON (PF) 4 MG/2 ML INJECTION
4 mg/2 mL | Freq: Once | INTRAMUSCULAR | Status: AC
Start: 2015-07-18 — End: 2015-07-17
  Administered 2015-07-18: 04:00:00 via INTRAVENOUS

## 2015-07-18 MED ORDER — DIPHENHYDRAMINE 25 MG CAP
25 mg | Freq: Four times a day (QID) | ORAL | Status: DC | PRN
Start: 2015-07-18 — End: 2015-07-20

## 2015-07-18 MED ORDER — DOCUSATE SODIUM 100 MG CAP
100 mg | Freq: Every day | ORAL | Status: DC | PRN
Start: 2015-07-18 — End: 2015-07-20

## 2015-07-18 MED ORDER — SIMVASTATIN 40 MG TAB
40 mg | Freq: Every evening | ORAL | Status: DC
Start: 2015-07-18 — End: 2015-07-20
  Administered 2015-07-18 – 2015-07-20 (×2): via ORAL

## 2015-07-18 MED ORDER — ZOLPIDEM 5 MG TAB
5 mg | Freq: Every evening | ORAL | Status: DC | PRN
Start: 2015-07-18 — End: 2015-07-20

## 2015-07-18 MED ORDER — PHARMACY WARFARIN NOTE
Status: DC
Start: 2015-07-18 — End: 2015-07-20

## 2015-07-18 MED ORDER — INSULIN LISPRO 100 UNIT/ML INJECTION
100 unit/mL | Freq: Four times a day (QID) | SUBCUTANEOUS | Status: DC
Start: 2015-07-18 — End: 2015-07-20

## 2015-07-18 MED ORDER — PHARMACY WARFARIN NOTE
Status: DC
Start: 2015-07-18 — End: 2015-07-18

## 2015-07-18 MED FILL — OXYCODONE 15 MG TAB: 15 mg | ORAL | Qty: 1

## 2015-07-18 MED FILL — PROMETHAZINE IN NS 25 MG/50 ML IV PIGGY BAG: 25 mg/50 ml | INTRAVENOUS | Qty: 50

## 2015-07-18 MED FILL — BD POSIFLUSH NORMAL SALINE 0.9 % INJECTION SYRINGE: INTRAMUSCULAR | Qty: 10

## 2015-07-18 MED FILL — DIPHENHYDRAMINE 25 MG CAP: 25 mg | ORAL | Qty: 1

## 2015-07-18 MED FILL — MORPHINE 2 MG/ML INJECTION: 2 mg/mL | INTRAMUSCULAR | Qty: 1

## 2015-07-18 MED FILL — ONDANSETRON (PF) 4 MG/2 ML INJECTION: 4 mg/2 mL | INTRAMUSCULAR | Qty: 2

## 2015-07-18 MED FILL — PHARMACY WARFARIN NOTE: Qty: 1

## 2015-07-18 MED FILL — SIMVASTATIN 40 MG TAB: 40 mg | ORAL | Qty: 2

## 2015-07-18 MED FILL — MORPHINE 4 MG/ML SYRINGE: 4 mg/mL | INTRAMUSCULAR | Qty: 1

## 2015-07-18 NOTE — Progress Notes (Signed)
Speech-Language Pathology    07/18/2015    Clemencia CourseVeronica E [name]    [id number]    Time of Attempt: 10:49    MD orders were received and chart was reviewed.  Clinical evaluation was attempted; however, patient reported severe abdominal pain and then began vomiting [name].    Our service will continue to follow for evaluation as schedule and patient's condition allow.  Thank you, Morton StallAllison Y. Vittur, MSP/CCC-SLP (Pager: [phone])

## 2015-07-18 NOTE — Progress Notes (Signed)
Assessment/Plan:     Patient Active Problem List    Diagnosis Date Noted   ??? Acute CVA (cerebrovascular accident) (HCC) 07/17/2015   ??? Chest pain 05/19/2015   ??? Gastrointestinal hemorrhage 01/01/2015   ??? GI bleed 11/26/2014   ??? Back pain, lumbosacral 09/11/2011   ??? Arthritis of knee 01/04/2011   ??? Encounter for long-term (current) use of other medications 01/04/2011   ??? Abdominal pain, generalized 01/04/2011   ??? 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 Lupus (HCC) 12/03/2010   ??? Osteoarthrosis, unspecified whether generalized or localized, unspecified site 12/03/2010   ??? Neuralgia, neuritis, and radiculitis, unspecified 12/03/2010   ??? Myalgia and myositis, unspecified 12/03/2010   ??? Pain in limb 12/03/2010   ??? Pain in joint, multiple sites 12/03/2010   ??? Type II or unspecified type diabetes mellitus without mention of complication, not stated as uncontrolled 12/03/2010   ??? Rib pain 10/10/2010     1. Possible Acute cerebrovascular accident, history of CVAs with residual bilateral weakness per patient report, now appears to have worsened weakness on the right upper and lower extremities, including worsened imbalance. CAT scan of the head without any acute findings. We will follow 2D echocardiogram, carotid PVL, continue PT evaluation. lipid panel wnl, TSH elevated, will check free T4. Continue antiplatelets and neuro checks. I ordered MRI brain to further evaluate.  2. Supratherapeutic INR. Pharmacy to dose Coumadin, holding warfarin at this time, history of DVT.  3. Anemia, stable hemoglobin 8.6. No signs of bleeding.  4. Coronary artery disease, stable.  5. Hypertension. Allow for permissive hypertension.  6. Nausea vomiting. Keep patient nothing by mouth, start patient on IV fluid.  6. History of deep vein thrombosis/pulmonary embolus. Continue warfarin, Pharmacy to dose.   7. History of cerebrovascular accident.  8. Diabetes mellitus type 2, sliding scale insulin, fingersticks q.a.c. and at bedtime, diet controlled.  9. Chronic pain syndrome.  10. History of seizure disorder. Continue home medications.  11. History of gastric ulcer status post partial gastrectomy.  12. History of fibromyalgia.  13. Questionable history of lupus, history of osteoporosis, history of dyslipidemia.  15. Deep venous thrombosis prophylaxis with Coumadin. patient's INR is greater than 4.  ??  DISPOSITION:  Home in 2 to 4 days depending on response to treatment.  ??  CODE STATUS:  FULL CODE.  ??  Continue home regimen for otherwise chronic, stable medical conditions as noted above.    Discussed with patient and RN. Updates regarding management, prognosis, treatment and complications of the patient's medical conditions discussed in detail. All questions answered to the satisfaction of those individual(s) who also verbalized understanding of and agreement with the assessment and plan.   Date of anticipated Discharge: In 1-2 days    EXAM:  GENERAL: in mild distress.   HEENT: Normocephalic and atraumatic head. Oral mucosa moist. No thrush.    RESPIRATORY: No use of accessory muscles of respiration. Bilateral BS present, decreased at bases. No rales or rhonchi.   CARDIOVASCULAR: S1 and S2 present, regular. No murmur, rub, or thrill.   ABDOMEN: Soft and nontender with positive bowel sounds. No organomegaly.   EXTREMITIES: No edema. No calf tenderness.   NEUROLOGICAL: Cranial nerves II through XII grossly intact. Generalized weakness.   SKIN: No rash. Skin is warm, dry, and intact.     Subjective:     Patient complaining of upper extremity weakness. Complaining of nausea vomiting.      Vitals:  07/17/15 2358 07/18/15 0050 07/18/15 0556 07/18/15 0719   BP:  113/68 116/64 113/76   Pulse: 85 91 97 89   Resp:  18 18 18    Temp:  98.6 ??F (37 ??C) 98.7 ??F (37.1 ??C) 98.8 ??F (37.1 ??C)   SpO2: 99% 98% 97% 100%   Weight:        Height:           Recent Labs      07/18/15   0603   WBC  5.5   HGB  8.6*   HCT  28.3*   MCV  83.5   PLT  176     Recent Labs      07/18/15   0603  07/18/15   0602   NA  143   --    K  4.4   --    CL  110*   --    CO2  26   --    CA  7.8*   --    BUN  13   --    CREA  0.7   --    GLU  80   --    INR   --   4.7*     Recent Labs      07/18/15   0603   ALB  3.1*   TP  6.0*   SGOT  73*   AP  94   TBILI  0.3   ALT  35     No results for input(s): GLUCU, BILU, KETU, SPGRU, BLDU, PHU, PROTU, UROU, NITU, LEUKU in the last 72 hours.    No lab exists for component: SOURCEUR   Recent Labs      07/18/15   0043  07/17/15   2051   GLUCPOC  111*  109*         Total of 37 minutes of which more than 50% was spent in coordination of care and counseling (time spent with patient/family face to face, physical exam, reviewing laboratory and imaging investigations, speaking with physicians and nursing staff involved in this patient's care).     Faythe GheeLisa Shaka Cardin D.O.  [facility]

## 2015-07-18 NOTE — Other (Signed)
----------  DocumentID:   [id number]------------------------------------------------              [facility]                       Patient Education Report         Name: [name] [name], [name] [name]                  Date: 07/18/2015    MRN: [id number]                    Time: 12:40:13 AM         Patient ordered video: Patient Safety: Stay Safe While you are in the   Hospital    from [id number] via phone number: [phone] at 12:40:13 AM

## 2015-07-18 NOTE — H&P (Signed)
[facility]  History and Physical  NAME:  [name] [name], [name] [name]  SEX:   F  ADMIT: 07/17/2015  DOB:1951/08/17  MR#    [id number]  ROOM:  6701  ACCT#  [id number]    I hereby certify this patient for admission based upon medical necessity as   noted below:    <    DATE OF SERVICE:  07/18/2015    CHIEF COMPLAINT:  Right-sided weakness.    HISTORY OF PRESENT ILLNESS:  A 64 year old female with past medical history significant for coronary artery   disease status post stent placement times 3, hypertension, CVA, PE in 2009   and DVT in 2011, on chronic anticoagulation, presents with a supratherapeutic   INR, diabetes mellitus type 2, diet controlled, anemia, chronic pain, gastric   ulcer status post partial gastrectomy, seizure disorder, fibromyalgia,   vertigo, thyroid disease, lupus who presents to the emergency room with of   chief complaint of right sided weakness also generalized weakness and   imbalance.  Her symptoms started about 2 weeks ago.  The patient reports to   have bilateral weakness due to her previous CVAs at baseline and ambulates at   baseline with a cane.  She has also experienced some intermittent slurring of   the speech.  In the emergency room, she underwent a CAT scan of the head which   did not reveal any acute findings.  Laboratory results and vital signs   otherwise stable.  Stroke workup initiated.    REVIEW OF SYSTEMS:  A 12-point systems was negative except as noted in HPI.    PAST MEDICAL HISTORY:  As per HPI.    SOCIAL HISTORY:  No alcohol or tobacco use.    FAMILY HISTORY:  Reviewed, noncontributory to the case.    ALLERGIES:  INCLUDE OPANA, TYLENOL AND ASPIRIN.    HOME MEDICATIONS:  Per discharge summary 1 month ago include Coumadin, Celexa, Prozac,   amitriptyline, Atrovent, Fosamax, Flexeril, Benadryl, iron, Lamictal,   oxycodone, albuterol, Zocor, vitamin D.    PHYSICAL EXAMINATION:  VITAL SIGNS:  Blood pressure 135/78, pulse 90, respiratory rate 16,    temperature is 98.4, saturating 96% to 99% on room air.  GENERAL APPEARANCE:  No acute distress, resting comfortably, appears stated   age.  HEENT:  Normocephalic, atraumatic.  Extraocular movements are intact.  Pupils   equal, round, reactive to light and accommodation.  NECK:  Supple, no JVD, no lymphadenopathy, no thyromegaly.  LUNGS:  Clear to auscultation bilaterally, good air entry, no wheezing.  CARDIOVASCULAR:  S1 and S2 within normal limits.  No murmurs, rubs or gallops.  ABDOMEN:  Positive bowel sounds, soft, nontender, nondistended, no   hepatosplenomegaly.  No guarding or rebound tenderness.  EXTREMITIES:  No clubbing, cyanosis or edema.  Dorsalis pedis pulses 2+   bilaterally.  SKIN:  No rash, no wounds.  NEUROLOGIC:  Cranial nerves II through XII appear intact.  Muscle strength 3/5   right upper extremity, 4+/5 left upper extremity, 3/5 right lower extremity   and 4+/5 left lower extremity.  Additionally, has significantly decreased   sensation on the right upper and lower extremity compared to left side.    Unable to perform Romberg, pronator drift, hyporeflexic knee jerk bilaterally.    The patient is alert and oriented times 3.    ASSESSMENT AND  PLAN:  1.  Acute cerebrovascular accident, history of CVAs with residual bilateral   weakness per patient report, now appears to have worsened weakness on  the   right upper and lower extremities, including worsened imbalance.  CAT scan of   the head without any acute findings.  We will continue to trend cardiac   enzymes, telemonitoring, 2D echocardiogram, carotid PVL, neurology   consultation.  PT evaluation.  Check lipid panel, TSH, hemoglobin A1c,   antiplatelets and  neuro checks.  2.  Supratherapeutic INR.  Pharmacy to dose Coumadin, holding warfarin at this   time, history of DVT.  3.  Anemia, stable hemoglobin and hematocrit 9.1 and 30.1, repeat 10.9 and 32.    On 06/16/2015, hemoglobin and hematocrit were 9.8 and 32.4.  No signs of    reported bleeding.  4.  Coronary artery disease, stable.  5.  Hypertension.  Allow for permissive hypertension.  6.  History of deep vein thrombosis/pulmonary embolus.  Continue warfarin,   Pharmacy to dose.  7.  History of cerebrovascular accident.  8.  Diabetes mellitus type 2, sliding scale insulin, fingersticks q.a.c.  and   at bedtime, diet controlled.  9.  Chronic pain syndrome.  10.  History of seizure disorder.  Continue home medications.  11.  History of gastric ulcer status post partial gastrectomy.  12.  History of fibromyalgia.  13.  Questionable history of lupus, history of osteoporosis, history of   dyslipidemia.  14.  Diet is cardiac.  15.  Deep venous thrombosis prophylaxis with patient's INR is greater than 4.    DISPOSITION:  Home in 2 to 4 days depending on response to treatment.    CODE STATUS:  FULL CODE.    Case discussed with ER attending and the patient.      ___________________  Irving Burton MD  Dictated By: .   MN  D:07/18/2015 16:10:96  T: 07/18/2015 03:32:13  [id number]

## 2015-07-18 NOTE — Other (Signed)
Bedside and Verbal shift change report given to Madeline P Santorum-Daniel, RN  (oncoming nurse) by Janet (offgoing nurse). Report included the following information SBAR, Kardex and Recent Results.

## 2015-07-18 NOTE — Progress Notes (Signed)
Tentative dc plan: Home with family and home health vs skilled nursing facility for short term rehab pending therapy recommendations and follow up with the patient and family. The patient complained of n/v during assessment and was not speaking very clear prior to that.    Anticipated Discharge Date: 1-2 days pending progress    5 Inpatient admissions in the last 12 months    PCP: Lewis MoccasinJasmine A Finch, MD     Specialists: Dr. Tory EmeraldGotti- Cardiology     Ph[facility]    DME: Gilmer Morane for in the house and walker for outside     Home Environment: [place] [address]  [place] [id number] [phone].     Prior to admission open services: Discharged on 06/16/15 with [facility]    Home Health Agency-  Personal Care Agency-    Extended Emergency Contact Information  Primary Emergency Contact: Kerby LessEdwards,Gariel Lettrel  Address: [address]           [place], [place] [id number] [place]  Home Phone: [phone]  Relation: Daughter     Transportation: TBD pending progress    Therapy Recommendations:    OT = pending    PT = pending    SLP = pending     RT Home O2 Evaluation =  na    Wound Care = na       Case Management Assessment        Preferred Language for Healthcare Related Communication     Preferred Language for Healthcare Related Communication: English   Spiritual/Ethnic/Cultural/Religious Needs that Should be Incorporated Into Your Care   Spiritual/Ethnic/Cultural/Religious Needs that Should be Incorporated Into Your Care: Christian      FUNCTIONAL ASSESSMENT                   Decline in Gait/Transfer/Balance: Yes (comment) (left side more weak)       Decline in Capacity to Feed/Dress/Bathe: Yes (comment)   Developmental Delay: No   Chewing/Swallowing Problems: Yes (comment) (bites inner cheek)      DYSPHAGIA SCREENING   Vocal Quality/Secretions: Normal   History of Dysphagia: No   O2 Saturation: Normal   Alertness: Normal   Pre-Swallow Assessment Score: 0      Difficulty with Secretions      Difficulty with Secretions: No      Speech Slurred/Thick/Garbled     Speech Slurred/Thick/Garbled: No      ABUSE/NEGLECT SCREENING   Physical Abuse/Neglect: Denies   Sexual Abuse: Denies   Sexual Abuse: Denies   Other Abuse/Issues: Denies          PRIMARY DECISION MAKER                                        ADVANCE CARE PLANNING (ACP) DOCUMENTS   Confirm Advance Directive: None              Suicide/Psychosocial Screening   Primary Diagnosis or Primary Complaint of an Emotional Behavior Disorder: No   Patient is Currently Experiencing Depression: No   Suicidal Ideation/Attempts: No   Homicidal Ideation/Attempts: No   Alcohol/Drug Intoxication: No   Hallucinations/Delusions: No   Pending, Active, or Temporary Detention Orders: No   Aggressive/Inappropriate Behavior: No      SAD PERSONS  READMIT RISK TOOL   Support Systems: Family member(s), Friends \\ neighbors, Warehouse manager / faith community   Relationship with Primary Physician Group: Seen at least one time within the past 6 months   History of Falls Within Past 3 Months: Yes   Needs Assistance with Wound Care AND/OR Mgnt of O2, Nebulizer: No   Requires Financial, Physical and/or Educational Assistance With Medications: No   History of Mental Illness: No   Living Alone: No      CARE MANAGEMENT INTERVENTIONS       PCP Verified by CM: Yes       Palliative Care Consult (Criteria: CHF and RRAT>21): No   Mode of Transport at Discharge: BLS       Transition of Care Consult (CM Consult): Discharge Planning, Home Health                   Physical Therapy Consult: Yes   Occupational Therapy Consult: Yes   Speech Therapy Consult: Yes   Current Support Network: Other (patient not speaking clearly- unable to determine)   Reason for Referral: DCP Rounds   History Provided By: Patient, Medical Record, Physician   Patient Orientation: Alert and Oriented, Person, Place, Situation, Self   Cognition: Alert    Support System Response: Unavailable               Current Functional Level: Assistance with the following:, Bathing, Shopping, Housework, Toileting, Dressing, Cooking, Mobility   Primary Language: English   Can patient return to prior living arrangement: Unknown at present   Ability to make needs known:: Fair                   Types of Needs Identified: Disease Management Education, Treatment Education, ADLs/IADLs, Activity/Exercise, Functions Dependency Needs, Nutrition/Diet   Anticipated Discharge Needs: Home Health Services, Skilled Nursing Facility           Plan discussed with Pt/Family/Caregiver: Yes          DISCHARGE LOCATION   Discharge Placement: Home with home health

## 2015-07-18 NOTE — Other (Signed)
TRANSFER - OUT REPORT:    Verbal report given to Al(name) on Clemencia CourseVeronica E Lakey  being transferred to 6 east(unit) for routine progression of care       Report consisted of patient???s Situation, Background, Assessment and   Recommendations(SBAR).     Information from the following report(s) SBAR was reviewed with the receiving nurse.    Lines:   Peripheral IV 07/17/15 Right Wrist (Active)        Opportunity for questions and clarification was provided.      Patient transported with:   [employer]

## 2015-07-18 NOTE — Progress Notes (Signed)
OCCUPATIONAL THERAPY MISSED SESSION        Patient: [name] [name] (98(64 y.o. female)  Room: 6701/6701    Date: 07/18/2015  Time:  1137  Primary Diagnosis: Acute CVA (cerebrovascular accident) [facility](HCC)    OBJECTIVE:  Patient was not seen for skilled occupational therapy evaluation, secondary to held by nursing.    Comments: Pt's nurse stated pt is dizzy and has vomited twice and to hold therapy for this date.    PLAN: Will follow up as schedule permits.        Langley GaussKatherine A Baucom, OT

## 2015-07-18 NOTE — Progress Notes (Signed)
PT Missed Visit:  11:21 AM    RN, Marylu LundJanet, advised to hold PT eval.; report pt is dizzy and with n/v;    J.Santos, PT

## 2015-07-18 NOTE — Progress Notes (Signed)
Dr Renaldo Reelhuang made aware of his INR 4.7 hold coumadin.Patient vomited twice yellowish in color and thin in consistency.New medication ordered.

## 2015-07-18 NOTE — Other (Signed)
Bedside shift change report given to Marylu LundJanet (Cabin crewoncoming nurse) by Marta LamasAlvir RN (offgoing nurse). Report included the following information SBAR, Recent Results and Cardiac Rhythm NSR.

## 2015-07-18 NOTE — Progress Notes (Signed)
[facility]:     Consult for Warfarin Dosing by Pharmacy by Dr. Earlie LouJasarevic  Consult provided for this 64 y.o.  female , for indication of Thrombosis (Deep Venous Prevention)    Dose to achieve an INR goal of 2-3    HOLD Warfarin today. Daily INR ordered.    Significant drug interactions: see profile  Previous dose given Pt on Warfarin at home   PT/INR Lab Results   Component Value Date/Time    INR 4.7 07/18/2015 06:02 AM      Platelets Lab Results   Component Value Date/Time    PLATELET 176 07/18/2015 06:03 AM      H/H Lab Results   Component Value Date/Time    HGB 8.6 07/18/2015 06:03 AM        Pharmacy to follow daily and will provide subsequent Warfarin dosing based on clinical status.  Oleta MouseMeredith Hagen, [facility]PH)  Contact information [phone]

## 2015-07-18 NOTE — Progress Notes (Addendum)
Peripheral Vascular Lab Preliminary : Carotid Duplex     1. < 50% right internal carotid artery stenosis. Degree of stenosis is on the low end. Minimal plaque visualized.  2. < 50% left internal carotid artery stenosis.  Degree of stenosis is on the low end. Minimal plaque visualized.  3. Antegrade flow noted in the vertebral arteries.   4. Normal flow noted in the subclavian arteries.    Final report to follow   Tamarion Haymond Kay Cheila Wickstrom, RVS

## 2015-07-19 LAB — GLUCOSE, POC
Glucose (POC): 75 mg/dL (ref 65–105)
Glucose (POC): 105 mg/dL (ref 65–105)
Glucose (POC): 84 mg/dL (ref 65–105)
Glucose (POC): 103 mg/dL (ref 65–105)
Glucose (POC): 84 mg/dL (ref 65–105)

## 2015-07-19 LAB — T4, FREE: Free T4: 0.98 ng/dl (ref 0.76–1.46)

## 2015-07-19 LAB — PROTHROMBIN TIME + INR
INR: 1.9 — ABNORMAL HIGH (ref 0.1–1.1)
Prothrombin time: 21 seconds — ABNORMAL HIGH (ref 11.5–14.0)

## 2015-07-19 MED ORDER — WARFARIN 5 MG TAB
5 mg | Freq: Once | ORAL | Status: AC
Start: 2015-07-19 — End: 2015-07-19
  Administered 2015-07-19: via ORAL

## 2015-07-19 MED ORDER — SODIUM CHLORIDE 0.9 % IJ SYRG
INTRAMUSCULAR | Status: AC
Start: 2015-07-19 — End: 2015-07-18
  Administered 2015-07-19: 04:00:00

## 2015-07-19 MED ORDER — SODIUM CHLORIDE 0.9 % IJ SYRG
INTRAMUSCULAR | Status: DC | PRN
Start: 2015-07-19 — End: 2015-07-20

## 2015-07-19 MED ORDER — SODIUM CHLORIDE 0.9 % IJ SYRG
INTRAMUSCULAR | Status: AC
Start: 2015-07-19 — End: 2015-07-18
  Administered 2015-07-19: 01:00:00

## 2015-07-19 MED FILL — BD POSIFLUSH NORMAL SALINE 0.9 % INJECTION SYRINGE: INTRAMUSCULAR | Qty: 10

## 2015-07-19 MED FILL — SIMVASTATIN 40 MG TAB: 40 mg | ORAL | Qty: 2

## 2015-07-19 MED FILL — ONDANSETRON (PF) 4 MG/2 ML INJECTION: 4 mg/2 mL | INTRAMUSCULAR | Qty: 2

## 2015-07-19 MED FILL — MORPHINE 2 MG/ML INJECTION: 2 mg/mL | INTRAMUSCULAR | Qty: 1

## 2015-07-19 MED FILL — PROMETHAZINE IN NS 25 MG/50 ML IV PIGGY BAG: 25 mg/50 ml | INTRAVENOUS | Qty: 50

## 2015-07-19 MED FILL — COUMADIN 5 MG TABLET: 5 mg | ORAL | Qty: 1

## 2015-07-19 MED FILL — BD POSIFLUSH NORMAL SALINE 0.9 % INJECTION SYRINGE: INTRAMUSCULAR | Qty: 20

## 2015-07-19 NOTE — Progress Notes (Signed)
Blood drawn with butterfly #[id number] needle in left AC with ultrasound guidance.

## 2015-07-19 NOTE — Progress Notes (Signed)
Speech-Language Pathology    07/19/2015    6701/6701    Time of Attempt: 10:07    MD orders were received and chart was reviewed.  Clinical evaluation was attempted; however, patient is still nauseous per nursing/Lorna.  Per nursing, patient vomited 3x last night (per report from the night nurse) and is now only able to tolerate ice chips.  Due to nausea, our evaluation was held by nursing.  Our service will continue to follow for evaluation as schedule and patient's condition allow.      Thank you,     Cy BlamerGrace E. Mullen, MS/CCC-SLP (Pager: [phone])

## 2015-07-19 NOTE — Progress Notes (Signed)
Problem: Self Care Deficits Care Plan (Adult)  Goal: Interventions  Patient will achieve LT OT goals within 20 sessions.    Activities of Daily Living Goals:     Patient will be modified independent in grooming, upper body dressing, toileting and toilet transfer and min A for LB dressing and bathing using AE and or DME as needed.    Patient will verbalize understanding of bathroom and home safety, and AE options for ADL???s to insure safest return to home environment.      Mobility, Balance and Activity Tolerance Goals:    Patient will be modified independent with Supine to Sit, Sit to supine, Sit to Stand and Functional Transfer.  [facility] Sitting and Dynamic Sitting and fair+ standing balance.  Patient will tolerate standing for 5 minutes to promote maximal independence in ADL???s.  Patient will demonstrate fair activity tolerance during functional activities.    Upper Extremity Goals:    Patient will perform 3 sets of 10 repetitions of active strengthening exercises for bilateral UE???s.     Additional Goals:    Patient will state/observe safety precautions with modified independence.  OCCUPATIONAL THERAPY EVALUATION      Patient: [name] [name] ([age] y.o. female)  Room: 6701/6701     Date: 07/19/2015  Start Time:  958        End Time:  1022     Primary Diagnosis: Acute CVA (cerebrovascular accident) (HCC)           Precautions: Falls and pt complains of head pain, dizziness and gags with movement.  Weight bearing precautions: None     Orders and chart reviewed. Discussed with nursing.  Nursing approved pt for eval and treat.      ASSESSMENT :  Based on the objective data described below, the patient presents with   Decreased Strength effecting function  Decreased ADL/Functional Activities  Decreased Transfer Abilities  Decreased Balance  Increased Pain effecting function  Decreased Activity Tolerance  Decreased Independence with Home Exercise Program      Patient will benefit from skilled occupational therapy intervention to address the above impairments.      Patient???s rehabilitation potential is considered to be Good          PLAN :  Planned Interventions: Adaptive equipment, ADI training, activity tolerance, functional balance training, functional mobility training, therapeutic exercise, therapeutic activity, patient/caregiver education and training, home exercise program and neuromuscular re-education.  Frequency/Duration: Patient will be followed by occupational  therapy 1-5x/week to address goals.  Discharge Recommendations: Home health OT, SNF and TBD pending progress.  Further Equipment Recommendations for Discharge: may benefit from LB AE depending on progress.      Please refer to Patient Education and Care Plan sections of chart for additional details.      EDUCATION:      Barriers to Learning/Limitations:  None  Education provided patient on  Role of OT, Benefit of activity while hospitalized, Call for assistance, Staff assistance with mobility, Changes positions frequently, ADL training, Adaptive equipment use, Home safety, HEP, Safety, Functional mobility and Reinforce needed  Educational Handouts issued:   AROM ex., [facility] activities and Home safety  Patient / Family readiness to learn indicated by:   trying to perform skills       SUBJECTIVE:      Patient " I will try but I have back trouble".      OBJECTIVE DATA SUMMARY:      Present illness history:   Problem List  Date Reviewed: 01/03/2015           Codes Class Noted     Acute CVA (cerebrovascular accident) [facility]) ICD-10-CM: I63.9  ICD-9-CM: 434.91   07/17/2015           Chest pain ICD-10-CM: R07.9  ICD-9-CM: 786.50   05/19/2015           Gastrointestinal hemorrhage ICD-10-CM: K92.2  ICD-9-CM: 578.9   01/01/2015           GI bleed ICD-10-CM: K92.2  ICD-9-CM: 578.9   11/26/2014           Back pain, lumbosacral ICD-10-CM: M54.5, M54.89  ICD-9-CM: 724.2, 724.6   09/11/2011            Arthritis of knee ICD-10-CM: M19.90  ICD-9-CM: 716.96   01/04/2011           Encounter for long-term (current) use of other medications ICD-10-CM: Z79.899  ICD-9-CM: V58.69   01/04/2011           Abdominal pain, generalized ICD-10-CM: R10.84  ICD-9-CM: 789.07   01/04/2011           Neuropathy in diabetes [facility]) ICD-10-CM: E11.40  ICD-9-CM: 250.60, 357.2   01/04/2011           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  ICD-9-CM: 781.3   01/04/2011           Lupus (HCC) ICD-10-CM: M32.9  ICD-9-CM: 710.0   12/03/2010           Osteoarthrosis, unspecified whether generalized or localized, unspecified site ICD-10-CM: M19.90  ICD-9-CM: 715.90   12/03/2010           Neuralgia, neuritis, and radiculitis, unspecified ICD-10-CM: IMO0002  ICD-9-CM: 729.2   12/03/2010           Myalgia and myositis, unspecified ICD-10-CM: IMO0001  ICD-9-CM: 729.1   12/03/2010           Pain in limb ICD-10-CM: M79.609  ICD-9-CM: 729.5   12/03/2010           Pain in joint, multiple sites ICD-10-CM: M25.50  ICD-9-CM: 719.49   12/03/2010           Type II or unspecified type diabetes mellitus without mention of complication, not stated as uncontrolled ICD-10-CM: E11.9  ICD-9-CM: 250.00   12/03/2010           Rib pain ICD-10-CM: R07.81  ICD-9-CM: 786.50   10/10/2010                Past Medical history:   Past Medical History   Diagnosis Date   ??? Acute hypercapnic respiratory failure (HCC) 02/05/2013       W/ Encephalopathy requiring BiPAP, Etiology Uncertain however Possible Narcotic Benzodiazipine Related?    ??? Adjustment disorder with mixed anxiety and depressed mood     ??? Aneurysm of internal carotid artery     ??? Arthritis of knee     ??? Asthma     ??? Autoimmune disease (HCC)     ??? CAD (coronary artery disease)     ??? Cervicalgia     ??? Chest pain 03/17/2014       Dobutamine Stress Echo: NORMAL WALL MOTION AND GLOBAL SYSTOLIC FUNCTION AT REST AND AFTER  STRESS/EXERCISE WITH APPROPRIATE AUGMENTATION OF FUNCTION IN ALL SEGMENTS. NO EVIDENCE OF INDUCIBLE ISCHEMIA AT ADEQUATE  HEART RATE WITH DOBUTAMINE STRESS.             ??? Chronic low back pain 10/22/2014  [facility] MRI Lumbar w/o contrast: Moderate facet arthropathy at L4-L5 and L5-S1. Mild/moderate foraminal stenosis at these levels. Disc bulge with annular tear at L4-L5.    ??? Chronic pain syndrome     ??? Coccydynia     ??? Diabetes mellitus         NIDDM   ??? DVT (deep venous thrombosis) (HCC) 05/19/2007       [facility] CTA: 1. Small nonocclusive pulmonary embolus in a subsegmental branch to the left lower lobe. Larger filling defect in a segmental branch to the right lower lobe, not as low in attenuation as is normally seen with a pulmonary embolus, however is seen in multiple planes and remains concerning for an additional nonocclusive pulmonary embolus.   ??? Fibromyalgia     ??? Gastric ulcer     ??? Gastritis 01/03/2015       [facility] Admission, EGD + H. Pylori/Gastritis   ??? Generalized osteoarthritis of multiple sites     ??? GI bleed 01/01/2015       [facility] Admit 5/2-01/07/2015   ??? H. pylori infection 01/03/2015       [facility] Admission, EGD + H. Pylori/Gastritis   ??? Hemiparesis, right (HCC)     ??? Hyperlipidemia     ??? Hypertension     ??? Irregular sleep-wake rhythm     ??? Knee joint pain     ??? Lumbar spondylosis         Orthopedic Spine Surgeon: Dr. Ree Kida L. Siegel    ??? Lupus (HCC)     ??? Muscle spasm     ??? Normocytic anemia         Chronic Iron Deficiency Anemia   ??? Osteoporosis     ??? Pancreatitis     ??? Peripheral neuropathy (HCC)     ??? Pulmonary embolism (HCC)         Multiple   ??? Seizure (HCC)     ??? Seizures (HCC)     ??? Sensory ataxia     ??? SLE (systemic lupus erythematosus) (HCC)     ??? Stroke [facility])     ??? Subclinical hypothyroidism     ??? Vitamin D deficiency          Patient found: Bed, Telemetry, IV and Bed alarm.     Pain Assessment: 7/10  Pain Location:  head      PRIOR LEVEL OF FUNCTION / LIVING SITUATION      Information was obtained by:   patient  Home environment:   Lives with Daughter, 1 story, Entrance steps 2 and  holds onto a flower bed to steady self no HR and grandchildren live in home as well  Prior level of function:   pt reported toileting indep , able to dress UB and mod A LB bathing mainly asssit to get into tub and out of tub  Prior level of Instrumental Activities of Daily Living:   Does not perform  Home equipment:   Cane walker      COGNITIVE STATUS:      Mental Status:   Oriented to, person, place, month, year, situation, alert, awake and pleasant  Communication:   normal  Attention Span:   poor(0-41min)  Follows Commands:   1 step  General Cognition:   patient presents with, slow processing and delayed responses  Hearing:   grossly intact  Vision:   grossly intact and glasses      ACTIVITIES OF DAILY LIVING STATUS:      Based on direct  observation and clinical assessment.     Eating -  w/ adapt equipment and NPO  Grooming -  pt has AROM to be min A to comb hair but hair was in braids  UB Bathing -  min. assist, set-up, bed level and simulated  LB Bathing -  dependent, bed level and simulated  UB Dressing -  min. assist, bed level and hospital gown  LB Dressing -  dependent, bed level and socks  Toileting -  not tested      FUNCTIONAL MOBILITY and BALANCE STATUS:      Mobility:  pt would not go from supine to sit stating she was in too much pain.     Transfers:  pt would not try a transfer due to pain.     Balance:  not tested as pt would not try sitting or standing.     Activity Tolerance: poor- and frequent rest breaks.      UPPER EXTREMITY STATUS:      Right ROM/Strength:   AROM, WFL and decreased functional use and can not maintain for any time especially shld flex R hsld 3+/5, elbow flex 3+/5 elbow ext 3+/5 finger flex/ext  Left ROM/Strength:   AROM, WFL and shld felx 4-/5, elbow flex 4+/5 elbow ext 4-/5,wrist and finger 4+/5     Final position: bed, bed alarm, all needs close and nurse notified .     Thank you for this referral.  Langley Gauss, OTR/L  Pager: [phone]         TREATMENT       Patient received / participated in 8 minutes of treatment immediately following evaluation and/or educational instruction during evaluation.     OBJECTIVE:  Pt instructed in the importance of moving and staying active while in hospital to tolerance.  OTR instructed pt in HEP for UE for gross and fine motor. Pt able to return immediate demonstration. Pt. Issued written instructed for home safety and FM GM exercises.     ASSESSMENT: Pt would benefit from additional skilled OT to improve overall strength, balance , coord and self care indep.        PLAN: Continue OT POC        Langley Gauss, OTR/L

## 2015-07-19 NOTE — Progress Notes (Signed)
Assessment/Plan:     Patient Active Problem List    Diagnosis Date Noted   ??? Acute CVA (cerebrovascular accident) (HCC) 07/17/2015   ??? Chest pain 05/19/2015   ??? Gastrointestinal hemorrhage 01/01/2015   ??? GI bleed 11/26/2014   ??? Back pain, lumbosacral 09/11/2011   ??? Arthritis of knee 01/04/2011   ??? Encounter for long-term (current) use of other medications 01/04/2011   ??? Abdominal pain, generalized 01/04/2011   ??? 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 Lupus (HCC) 12/03/2010   ??? Osteoarthrosis, unspecified whether generalized or localized, unspecified site 12/03/2010   ??? Neuralgia, neuritis, and radiculitis, unspecified 12/03/2010   ??? Myalgia and myositis, unspecified 12/03/2010   ??? Pain in limb 12/03/2010   ??? Pain in joint, multiple sites 12/03/2010   ??? Type II or unspecified type diabetes mellitus without mention of complication, not stated as uncontrolled 12/03/2010   ??? Rib pain 10/10/2010     1. Possible Acute cerebrovascular accident, history of CVAs with residual bilateral weakness per patient report, now appears to have worsened weakness on the right upper and lower extremities, including worsened imbalance. CAT scan of the head without any acute findings. 2D echocardiogram with bubble study no shunting. carotid PVL no significant stenosis, continue PT evaluation. lipid panel wnl, TSH elevated, will check free T4. Continue antiplatelets and neuro checks. Follow MRI brain to further evaluate.  2. Supratherapeutic INR. Pharmacy to dose Coumadin, resolved with holding warfarin, history of DVT.  3. Anemia, stable hemoglobin 8.6. No signs of bleeding.  4. Coronary artery disease, stable.  5. Hypertension. Allow for permissive hypertension.  6. Nausea vomiting. Keep patient nothing by mouth, continue patient on IV fluid.   6. History of deep vein thrombosis/pulmonary embolus. Continue warfarin, Pharmacy to dose.  7. History of cerebrovascular accident.  8. Diabetes mellitus type 2, sliding scale insulin, fingersticks q.a.c. and at bedtime, diet controlled.  9. Chronic pain syndrome.  10. History of seizure disorder. Continue home medications.  11. History of gastric ulcer status post partial gastrectomy.  12. History of fibromyalgia.  13. Questionable history of lupus, history of osteoporosis, history of dyslipidemia.  15. Deep venous thrombosis prophylaxis with Coumadin. patient's INR is greater than 4.  ????  DISPOSITION:  Home in 1-2 days depending on response to treatment.  ????  CODE STATUS:  FULL CODE.  ??  Continue home regimen for otherwise chronic, stable medical conditions as noted above.    Discussed with patient and RN. Updates regarding management, prognosis, treatment and complications of the patient's medical conditions discussed in detail. All questions answered to the satisfaction of those individual(s) who also verbalized understanding of and agreement with the assessment and plan.   Date of anticipated Discharge: In 1-2 days    EXAM:  GENERAL: in mild distress.   HEENT: Normocephalic and atraumatic head. Oral mucosa moist. No thrush.    RESPIRATORY: No use of accessory muscles of respiration. Bilateral BS present, decreased at bases. No rales or rhonchi.   CARDIOVASCULAR: S1 and S2 present, regular. No murmur, rub, or thrill.   ABDOMEN: Soft and nontender with positive bowel sounds. No organomegaly.   EXTREMITIES: No edema. No calf tenderness.   NEUROLOGICAL: Cranial nerves II through XII grossly intact. No focal weakness.   SKIN: No rash. Skin is warm, dry, and intact.     Subjective:     Patient had nausea and vomiting.      Vitals:    07/18/15  1946 07/18/15 2345 07/19/15 0306 07/19/15 0700   BP: 140/77 148/70 124/78 134/68   Pulse: 88 84 83 84   Resp: Temp: 99.5 ??F (37.5 ??C) 98.6 ??F (37 ??C) 99.2 ??F (37.3 ??C) 98.9 ??F (37.2 ??C)   SpO2: 100% 100% 100% 96%   Weight:       Height:           Recent Labs      07/18/15   0603   WBC  5.5   HGB  8.6*   HCT  28.3*   MCV  83.5   PLT  176     Recent Labs      07/19/15   0430  07/18/15   0603   NA   --   143   K   --   4.4   CL   --   110*   CO2   --   26   CA   --   7.8*   BUN   --   13   CREA   --   0.7   GLU   --   80   INR  1.9*   --      Recent Labs      07/18/15   0603   ALB  3.1*   TP  6.0*   SGOT  73*   AP  94   TBILI  0.3   ALT  35     No results for input(s): GLUCU, BILU, KETU, SPGRU, BLDU, PHU, PROTU, UROU, NITU, LEUKU in the last 72 hours.    No lab exists for component: SOURCEUR   Recent Labs      07/19/15   0849  07/18/15   2350  07/18/15   2127  07/18/15   1652  07/18/15   1331  07/18/15   0043  07/17/15   2051   GLUCPOC  84  105  75  98  119*  111*  109*         Total of 32 minutes of which more than 50% was spent in coordination of care and counseling (time spent with patient/family face to face, physical exam, reviewing laboratory and imaging investigations, speaking with physicians and nursing staff involved in this patient's care).     Faythe Ghee D.O.  [facility]

## 2015-07-19 NOTE — Progress Notes (Signed)
Dr. Renaldo ReelHuang made aware of pts. C/o headache and nausea

## 2015-07-19 NOTE — Progress Notes (Signed)
Consult.    Pt anxious re hospitalization and pending surgery.     Provided opportunity to decompress, tell life stories. Reframed concerns using Scripture examples and provided handout.    Time spent 30 min.

## 2015-07-19 NOTE — Other (Signed)
Bedside and Verbal shift change report given to Madeline P Santorum-Daniel, RN  (oncoming nurse) by Lorna (offgoing nurse). Report included the following information SBAR, Kardex and Recent Results.

## 2015-07-19 NOTE — Progress Notes (Signed)
physical Therapy EVALUATION    Patient: [name] [name] (64 y.o. female)  Room: 6701/6701    Date: 07/19/2015  Start Time:  1146  End Time:  1210    Primary Diagnosis: Acute CVA (cerebrovascular accident) [facility])          Precautions: Falls.  Weight bearing precautions: FWB      ASSESSMENT :  Based on the objective data described below, the patient presents with  Poor endurance  Decreased standing balance  C/o dizziness, n/v  Min (A) with bed mobility  Min (A) with transfers using rw  Min (A) with gait 10' using rw, limited by dizziness and weakness    Goals:  1. (I) with bed mobility  2. SBA with transfers  3. CGA with gait 100' with rw   4. Tol. Sitting in chair tid for meals  5. Improve standing balance to good-      Patient???s rehabilitation potential is considered to be Good     Recommendations:  Physical Therapy and Occupational Therapy  Discharge Recommendations: SNF, Home with home health PT and TBD, pending progress  Further Equipment Recommendations for Discharge: tbd        PLAN :  Planned Interventions:  Functional mobility training Gait Training Balance Training Therapeutic exercises Therapeutic activities      Frequency/Duration: Patient will be followed by physical therapy 3x / Week and 5x / Week to address goals.                 Orders reviewed, chart reviewed on [name] [name].    SUBJECTIVE:   Patient: c/o dizziness, n/v, also complainted of back pain (chronic, per pt)    OBJECTIVE DATA SUMMARY:   Present illness history:   Problem List  Date Reviewed: 14-Jan-2015          Codes Class Noted    Acute CVA (cerebrovascular accident) [facility]) ICD-10-CM: I63.9  ICD-9-CM: 434.91  07/17/2015        Chest pain ICD-10-CM: R07.9  ICD-9-CM: 786.50  05/19/2015        Gastrointestinal hemorrhage ICD-10-CM: K92.2  ICD-9-CM: 578.9  01/01/2015        GI bleed ICD-10-CM: K92.2  ICD-9-CM: 578.9  11/26/2014        Back pain, lumbosacral ICD-10-CM: M54.5, M54.89  ICD-9-CM: 724.2, 724.6  09/11/2011         Arthritis of knee ICD-10-CM: M19.90  ICD-9-CM: 716.96  01/04/2011        Encounter for long-term (current) use of other medications ICD-10-CM: Z79.899  ICD-9-CM: V58.69  01/04/2011        Abdominal pain, generalized ICD-10-CM: R10.84  ICD-9-CM: 789.07  01/04/2011        Neuropathy in diabetes [facility]) ICD-10-CM: E11.40  ICD-9-CM: 250.60, 357.2  01/04/2011        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  ICD-9-CM: 781.3  01/04/2011        Lupus (HCC) ICD-10-CM: M32.9  ICD-9-CM: 710.0  12/03/2010        Osteoarthrosis, unspecified whether generalized or localized, unspecified site ICD-10-CM: M19.90  ICD-9-CM: 715.90  12/03/2010        Neuralgia, neuritis, and radiculitis, unspecified ICD-10-CM: [id number]  ICD-9-CM: 729.2  12/03/2010        Myalgia and myositis, unspecified ICD-10-CM: IMO0001  ICD-9-CM: 729.1  12/03/2010        Pain in limb ICD-10-CM: M79.609  ICD-9-CM: 729.5  12/03/2010        Pain in joint, multiple sites ICD-10-CM: M25.50  ICD-9-CM: 719.49  12/03/2010        Type II or unspecified type diabetes mellitus without mention of complication, not stated as uncontrolled ICD-10-CM: E11.9  ICD-9-CM: 250.00  12/03/2010        Rib pain ICD-10-CM: R07.81  ICD-9-CM: 786.50  10/10/2010             Past Medical history:   Past Medical History   Diagnosis Date   ??? Acute hypercapnic respiratory failure (HCC) 02/05/2013     W/ Encephalopathy requiring BiPAP, Etiology Uncertain however Possible Narcotic Benzodiazipine Related?    ??? Adjustment disorder with mixed anxiety and depressed mood    ??? Aneurysm of internal carotid artery    ??? Arthritis of knee    ??? Asthma    ??? Autoimmune disease (HCC)    ??? CAD (coronary artery disease)    ??? Cervicalgia    ??? Chest pain 03/17/2014     Dobutamine Stress Echo: NORMAL WALL MOTION AND GLOBAL SYSTOLIC FUNCTION AT REST AND AFTER?? STRESS/EXERCISE WITH APPROPRIATE AUGMENTATION OF FUNCTION IN ALL SEGMENTS. NO EVIDENCE OF INDUCIBLE ISCHEMIA AT ADEQUATE HEART RATE WITH DOBUTAMINE STRESS.??             ??? Chronic low back pain 10/22/2014     [facility] MRI Lumbar w/o contrast: Moderate facet arthropathy at L4-L5 and L5-S1. Mild/moderate foraminal stenosis at these levels. Disc bulge with annular tear at L4-L5.    ??? Chronic pain syndrome    ??? Coccydynia    ??? Diabetes mellitus      NIDDM   ??? DVT (deep venous thrombosis) (HCC) 05/19/2007     [facility] CTA: 1. Small nonocclusive pulmonary embolus in a subsegmental branch to the left lower lobe. Larger filling defect in a segmental branch to the right lower lobe, not as low in attenuation as is normally seen with a pulmonary embolus, however is seen in multiple planes and remains concerning for an additional nonocclusive pulmonary embolus.   ??? Fibromyalgia    ??? Gastric ulcer    ??? Gastritis 01/03/2015     [facility] Admission, EGD + H. Pylori/Gastritis   ??? Generalized osteoarthritis of multiple sites    ??? GI bleed 01/01/2015     [facility] Admit 5/2-01/07/2015   ??? H. pylori infection 01/03/2015     [facility] Admission, EGD + H. Pylori/Gastritis   ??? Hemiparesis, right (HCC)    ??? Hyperlipidemia    ??? Hypertension    ??? Irregular sleep-wake rhythm    ??? Knee joint pain    ??? Lumbar spondylosis      Orthopedic Spine Surgeon: Dr. Ree Kida L. Siegel    ??? Lupus (HCC)    ??? Muscle spasm    ??? Normocytic anemia      Chronic Iron Deficiency Anemia   ??? Osteoporosis    ??? Pancreatitis    ??? Peripheral neuropathy (HCC)    ??? Pulmonary embolism (HCC)      Multiple   ??? Seizure (HCC)    ??? Seizures (HCC)    ??? Sensory ataxia    ??? SLE (systemic lupus erythematosus) (HCC)    ??? Stroke [facility])    ??? Subclinical hypothyroidism    ??? Vitamin D deficiency        Prior Level of Function/Home Situation:   Home environment: Lives with Daughter, Lives with Family, House, 1 story, Entrance steps 2.  Prior level of function: unable to drive, but able to sit in car, Ambulates with device and house hold ambulatory , was receiving hhpt?  prior to adm.  Home equipment: rolling walker and straight cane       Patient found: Bed and bed alarm on, seize pads on bedrailing.    Pain Assessment before PT session: None observed and None reported  Pain Location:   Pain Assessment after PT session: None observed and None reported  Pain Location:              Yes, patient had pain medications            No, Patient has not had pain medications            Nurse notified    COGNITIVE STATUS:     Mental Status: Oriented x3 and alert.  Communication: normal.  Follows commands: intact.  General Cognition: intact .  Hearing: normal.  Vision:  normal.      EXTREMITIES ASSESSMENT:        Strength:    Grossly graded good- bue/le's      Range Of Motion:  wfl      Functional mobility and balance status:     Rolling -  standby assist  Supine to sit -  min. assist  Sit to Supine -  min. assist  Sit to Stand -  min. assist  Stand to Sit -  min. assist  Bed to bedside chair -  min. assist and assistive device rw  Bedside chair to bed -  min. assist and assistive device rw      Balance:   Static Sitting Balance -  normal  Dynamic Sitting Balance -  normal  Static Standing Balance -  poor  Dynamic Standing Balance -  poor        Ambulation/Gait Training:  Amb. 10' total with min (A), limited pooor gait tolerance due to dizziness and weakness    Stair Training:  nt    Therapeutic Exercises:    Patient received/participated in 8 minutes of treatment (mobility and gait training/activities) immediately following evaluation and/or educational instruction during/immediately following PT evaluation.    Activity Tolerance:   poor    Final Location:   bed, bed alarm, all needs close and agrees to call for assistance    COMMUNICATION/EDUCATION:   Education: Patient, Benefit of activity while hospitalized, Call for assistance, Out of bed 2-3 times/day, Staff assistance with mobility, Verbalized understanding and Reinforce needed  Barriers to Learning/Limitations: None    Please refer to care plan and patient education section for further details.     Thank you for this referral.  Hilarie FredricksonJoselito Santos, PT  Pager [phone]

## 2015-07-19 NOTE — Progress Notes (Signed)
Warfarin - dosing- inr 4.7->1.9 (held) will dose 5 mg today, inr qam. Inr goal 2-3.  Hx dvt, hx cva.   Following.

## 2015-07-19 NOTE — Other (Signed)
Bedside and Verbal shift change report given to me (oncoming nurse) by Malachy Chambermadeline rn (offgoing nurse). Report included the following information SBAR, Kardex and Cardiac Rhythm nsr.

## 2015-07-20 ENCOUNTER — Inpatient Hospital Stay: Admit: 2015-07-20 | Payer: MEDICAID | Primary: Internal Medicine

## 2015-07-20 LAB — GLUCOSE, POC
Glucose (POC): 111 mg/dL — ABNORMAL HIGH (ref 65–105)
Glucose (POC): 93 mg/dL (ref 65–105)
Glucose (POC): 71 mg/dL (ref 65–105)

## 2015-07-20 LAB — PROTHROMBIN TIME + INR
INR: 1.6 — ABNORMAL HIGH (ref 0.1–1.1)
Prothrombin time: 18.6 seconds — ABNORMAL HIGH (ref 11.5–14.0)

## 2015-07-20 MED ORDER — GADOBUTROL 1 MMOL/ML (604.72 MG/ML) IV
1 mmol/mL (604.72 mg/mL) | Freq: Once | INTRAVENOUS | Status: AC
Start: 2015-07-20 — End: 2015-07-20
  Administered 2015-07-20 (×2): via INTRAVENOUS

## 2015-07-20 MED ORDER — SODIUM CHLORIDE 0.9 % IJ SYRG
INTRAMUSCULAR | Status: DC | PRN
Start: 2015-07-20 — End: 2015-07-20

## 2015-07-20 MED ORDER — OXYCODONE 15 MG TAB
15 mg | ORAL_TABLET | Freq: Four times a day (QID) | ORAL | 0 refills | Status: DC
Start: 2015-07-20 — End: 2019-02-08

## 2015-07-20 MED ORDER — WARFARIN 3 MG TAB
3 mg | Freq: Once | ORAL | Status: DC
Start: 2015-07-20 — End: 2015-07-20

## 2015-07-20 MED FILL — BD POSIFLUSH NORMAL SALINE 0.9 % INJECTION SYRINGE: INTRAMUSCULAR | Qty: 50

## 2015-07-20 MED FILL — MORPHINE 2 MG/ML INJECTION: 2 mg/mL | INTRAMUSCULAR | Qty: 1

## 2015-07-20 MED FILL — PROMETHAZINE IN NS 25 MG/50 ML IV PIGGY BAG: 25 mg/50 ml | INTRAVENOUS | Qty: 50

## 2015-07-20 MED FILL — GADAVIST 1 MMOL/ML (604.72 MG/ML) INTRAVENOUS SOLUTION: 1 mmol/mL (604.72 mg/mL) | INTRAVENOUS | Qty: 14

## 2015-07-20 MED FILL — SIMVASTATIN 40 MG TAB: 40 mg | ORAL | Qty: 2

## 2015-07-20 NOTE — Other (Signed)
Bedside and Verbal shift change report given to Abiola Dairo,RN (oncoming nurse) by Madeline,RN (offgoing nurse). Report included the following information SBAR and Kardex.

## 2015-07-20 NOTE — Progress Notes (Signed)
Initial visit with patient.     Patient requested Bible from pastoral care department. Upon arrival patient was laying in bed watching TV. Patient was inviting and engaging.  Patient shared life stories and statements of faith. Patient showed signs of stress.    Chaplain provided patient with requested bible, emotional encouragement, decompression, and spiritual support.     Time spent: 1 hour and 30 minutes.

## 2015-07-20 NOTE — Progress Notes (Signed)
Discharge Plan: Home with family assistance, physician follow up, and resume home health RN, OT, PT, [facility] and [facility]    Discharge Date: 07/20/2015     TCC Referral: No    Home Health Agency: [facility]    DME needed and ordered for Discharge: None    Confirmed start of care date with at the home health agency: Yes, [facility]    Disposition discussed with the patient and or family and questions answered: Yes, the patient    Mode of Transportation: Family

## 2015-07-20 NOTE — Progress Notes (Signed)
Problem: Falls - Risk of  Goal: *Absence of falls  Outcome: Progressing Towards Goal  Patient is free of fall during shift. Fall precautions maintained. Will continue to monitor.    Problem: Pressure Ulcer - Risk of  Goal: *Prevention of pressure ulcer  Outcome: Progressing Towards Goal  Patient have no sign of pressure ulcer. Patient rotates from bed to chair.    Problem: Gas Exchange - Impaired  Goal: *Absence of hypoxia  Outcome: Progressing Towards Goal  Patient have no complaint of shortness of breath. Will continue to monitor.

## 2015-07-20 NOTE — Discharge Summary (Signed)
Discharge Summary   Admit Date: 07/17/2015  Discharge Date:  July 20, 2015        Patient ID:  [name] [name]  64 y.o.  21-Apr-1951    Chief Complaint   Patient presents with   ??? Extremity Weakness   ??? Altered mental status       Discharge Diagnoses/Hospital Course:    1. No Acute cerebrovascular accident, history of CVAs with residual bilateral weakness per patient report, now appears to have worsened weakness on the right upper and lower extremities, including worsened imbalance. CAT scan of the head without any acute findings. 2D echocardiogram with bubble study no shunting. carotid PVL no significant stenosis, continue PT evaluation. lipid panel wnl, TSH elevated, will check free T4. Continue antiplatelets and neuro checks. MRI brain no acute abnormity.   2. Supratherapeutic INR. Pharmacy to dose Coumadin, resolved with holding warfarin, history of DVT.  3. Anemia, stable hemoglobin 8.6. No signs of bleeding.  4. Coronary artery disease, stable.  5. Hypertension. Allow for permissive hypertension.  6. Nausea vomiting. Keep patient nothing by mouth, continue patient on IV fluid.  6. History of deep vein thrombosis/pulmonary embolus. Continue warfarin, Pharmacy to dose.  7. History of cerebrovascular accident.  8. Diabetes mellitus type 2, sliding scale insulin, fingersticks q.a.c. and at bedtime, diet controlled.  9. Chronic pain syndrome.  10. History of seizure disorder. Continue home medications.  11. History of gastric ulcer status post partial gastrectomy.  12. History of fibromyalgia.  13. Questionable history of lupus, history of osteoporosis, history of dyslipidemia.  15. Deep venous thrombosis prophylaxis with Coumadin. patient's INR is greater than 4.  ????  DISPOSITION:  Home in 1-2 days depending on response to treatment.  ????  CODE STATUS:  FULL CODE.  Patient Active Problem List    Diagnosis Date Noted    ??? Acute CVA (cerebrovascular accident) (HCC) 07/17/2015   ??? Chest pain 05/19/2015   ??? Gastrointestinal hemorrhage 01/01/2015   ??? GI bleed 11/26/2014   ??? Back pain, lumbosacral 09/11/2011   ??? Arthritis of knee 01/04/2011   ??? Encounter for long-term (current) use of other medications 01/04/2011   ??? Abdominal pain, generalized 01/04/2011   ??? 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 Lupus (HCC) 12/03/2010   ??? Osteoarthrosis, unspecified whether generalized or localized, unspecified site 12/03/2010   ??? Neuralgia, neuritis, and radiculitis, unspecified 12/03/2010   ??? Myalgia and myositis, unspecified 12/03/2010   ??? Pain in limb 12/03/2010   ??? Pain in joint, multiple sites 12/03/2010   ??? Type II or unspecified type diabetes mellitus without mention of complication, not stated as uncontrolled 12/03/2010   ??? Rib pain 10/10/2010       Problem List Items Addressed This Visit     None            Current Discharge Medication List      CONTINUE these medications which have NOT CHANGED    Details   warfarin (COUMADIN) 5 mg tablet Take 2 tablets ( ) daily on 10/15, 10/16 & have INR checked 10/17  Qty: 1 Tab, Refills: 0      citalopram (CELEXA) 20 mg tablet Take 20 mg by mouth daily.      cyclobenzaprine (FLEXERIL) 5 mg tablet Take 10 mg by mouth three (3) times daily as needed for Muscle Spasm(s).      simvastatin (ZOCOR) 80 mg tablet Take 80 mg by mouth nightly.  diphenhydrAMINE (BENADRYL) 25 mg capsule Take 25 mg by mouth every six (6) hours as needed.      FLUoxetine (PROZAC) 40 mg capsule Take 1 Cap by mouth daily.  Qty: 30 Cap, Refills: 0      lamoTRIgine (LAMICTAL) 100 mg tablet Take 1 Tab by mouth two (2) times a day.  Qty: 60 Tab, Refills: 0      oxyCODONE IR (OXY-IR) 15 mg immediate release tablet Take 15 mg by mouth four (4) times daily.      ferrous gluconate 324 mg (36 mg iron) tab Take 1 Tab by mouth Before breakfast and dinner.  Qty: 60 Tab, Refills: 2       alendronate-vitamin d3 70-2,800 mg-unit per tablet Take 1 Tab by mouth every seven (7) days.      albuterol (PROVENTIL HFA) 90 mcg/Actuation inhaler Take 1 Puff by inhalation as needed.      albuterol-ipratropium (DUO-NEB) 0.5 mg-3 mg(2.5 mg base)/3 mL nebulizer solution 3 mL by Nebulization route once.      Cholecalciferol, Vitamin D3, (VITAMIN D) 1,000 unit Cap Take 50,000 Units by mouth every seven (7) days.      amitriptyline (ELAVIL) 75 mg tablet Take 1 Tab by mouth nightly.  Qty: 30 Tab, Refills: 1             Imaging:  Xr Chest Sngl V    Result Date: 07/17/2015  AP chest. HISTORY: Shortness of breath. FINDINGS: Cardiac silhouette is normal. The lungs clear of any acute process. No effusions. Bony structures intact.     IMPRESSION: Negative chest x-ray.     Ct Head Wo Cont    Result Date: 07/17/2015  Noncontrast head CT. Comparison 04/06/2015. HISTORY: CVA. TECHNIQUE: Noncontrast study. FINDINGS: Ventricles normal in size and position. No intracranial hemorrhage or acute infarct. Bony calvarium intact.     IMPRESSION: No acute intracranial abnormalities.     Duplex Carotid Bilateral    Result Date: 07/18/2015                                                               Study ID: [id number]                          [facility]                          [address] [place], [place] [address]                             Carotid Duplex Report Name: BRITTIN, BELNAP Date: 11          /16/2016 08:23 AM MRN: [id number]                        Patient Location: 4219 DOB: [birth year]                    Age: 57 yrs Gender: Female  Account #: [id number] Reason For Study: Cerebral Vascular Accident (CVA) Ordering Physician: Irving Burton Performed By: Allayne Butcher Interpretation Summary < 50% right internal carotid artery stenosis. Degree of stenosis is on the low end. Minimal plaque visualized. < 50% left internal carotid artery  stenosis. Degree of stenosis is on the low end. Minimal plaque visualized. Antegrade flow noted in the vertebral arteries. Normal flow noted in the subclavian arteries. _____________________________________________________________________________ _ QUALITY/PROCEDURE Extracranial Carotid Duplex (16109). ICD 10- I63.8. HISTORY/SYMPTOMS HTN. CVA. PE. DVT. CAD. Coronary Artery Stents. Diabetes. Seizure Disorder. Fibromyalgia. Thyroid Disease. Lupus. RIGHT VELOCITIES Velocities in the common, internal, and external carotid arteries are all within normal limits. RIGHT EXTRACRANIAL There is intimal thickening but no significant atherosclerotic plaque noted in the right common carotid artery. There is homogenous, smooth atherosclerotic plaque noted in the right internal carotid artery. The Doppler flow velocities within the right internal carotid artery are within normal limits, i.e < 50% stenosis. Antegrade flow is noted in the right vertebral artery. LEFT VELOCITIES Velocities in the common, internal, and external carotid arteries are all within normal limits. LEFT EXTRACRANIAL There is intimal thickening but no significant atherosclerotic plaque noted in the left common carotid artery. There is homogenous, smooth atherosclerotic plaque noted in the left internal carotid artery. The Doppler flow velocities within the left internal carotid artery are within normal limits, i.e < 50% stenosis. Antegrade flow is noted in the left vertebral artery. Measurements and Calculations                              Right         No         Left    Unit                                        Laterality Prox CCA PSV                  61.6                   -176.0   cm/sec Prox CCA EDV                  24.0                    -25.1   cm/sec Dist CCA PSV                  70.4                    69.5    cm/sec Dist CCA EDV                  24.6                    27.4    cm/sec CCA ratio vel                  70.4                    176.0   cm/sec Prox ECA PSV                 -53.4                    -  56.4   cm/sec Prox ECA EDV                 -13.0                    -8.0    cm/sec Prox ICA PSV                 -74.3                    -99.2   cm/sec Prox ICA EDV                 -38.4                    -39.4   cm/sec Mid ICA PSV                  -103.0                   -92.8   cm/sec Mid ICA EDV                  -42.4                    -41.4   cm/sec Dist ICA PSV                 -86.4                   -161.0   cm/sec Dist ICA EDV                 -38.9                    -53.7   cm/sec ICA ratio vel                103.0                    99.2    cm/sec Vertebral A PSV               49.9                    86.2    cm/sec Vertebral A EDV               19.0                    30.1    cm/sec ICA/CCA ratio                 1.5                     0.56 Prox SCLA PSV                -44.0                    -47.1   cm/sec Electronically signed byDr. Grier Mitts, MD   07/18/2015 06:04 PM       Physical Exam on Discharge:  Visit Vitals   ??? BP 102/68 (BP 1 Location: Left arm, BP Patient Position: At rest)   ??? Pulse 80   ??? Temp 98.5 ??F (36.9 ??C)   ??? Resp 18   ??? Ht 5\' 6"  (1.676 m)   ??? Wt 65.8 kg (145 lb)   ??? SpO2 95%   ??? Breastfeeding No   ???  BMI 23.4 kg/m2     GENERAL: in mild distress.   HEENT: Normocephalic and atraumatic head. Oral mucosa moist. No thrush.   Eyes: Pupils are equal, reactive to light and accommodation. Normal extra ocular movements. No icterus.   NECK: Supple. Trachea midline.   RESPIRATORY: No use of accessory muscles of respiration. Bilateral BS present, decreased at bases. No rales or rhonchi.   CARDIOVASCULAR: S1 and S2 present, regular. No murmur, rub, or thrill.   ABDOMEN: Soft and nontender with positive bowel sounds. No organomegaly.   EXTREMITIES: No edema. No calf tenderness.   NEUROLOGICAL: Cranial nerves II through XII grossly intact. No focal weakness.    SKIN: No rash. Skin is warm, dry, and intact.       Activity: As tolerated    Diet: Cardiac Diet    Labs:  Labs: Results:       Chemistry Recent Labs      07/18/15   0603  07/17/15   2056  07/17/15   2055   GLU  80  63*  60*   NA  143  145  145   K  4.4  3.8  3.8   CL  110*  114*  108*   CO2  26  24  22    BUN  13  11  11    CREA  0.7  0.8  0.9   CA  7.8*  8.1*   --    AP  94  87   --    TP  6.0*  6.6   --    ALB  3.1*  3.4   --       CBC w/Diff Recent Labs      07/18/15   0603  07/17/15   2056  07/17/15   2055   WBC  5.5  5.8   --    RBC  3.39*  3.54*   --    HGB  8.6*  9.1*  10.9*   HCT  28.3*  30.1*  32*   PLT  176  194   --    GRANS  59.7   --    --    LYMPH  27.9*   --    --    EOS  3.5   --    --       Cardiac Enzymes No results for input(s): CPK, CKND1, MYO in the last 72 hours.    No lab exists for component: CKRMB, TROIP   Coagulation Recent Labs      07/20/15   0440  07/19/15   0430   PTP  18.6*  21.0*   INR  1.6*  1.9*       Lipid Panel Lab Results   Component Value Date/Time    CHOLESTEROL, TOTAL 171 07/18/2015 06:03 AM    HDL CHOLESTEROL 70 07/18/2015 06:03 AM    LDL, CALCULATED 93 07/18/2015 06:03 AM    TRIGLYCERIDE 38 07/18/2015 06:03 AM    CHOL/HDL RATIO 2.4 07/18/2015 06:03 AM      BNP No results for input(s): BNPP in the last 72 hours.   Liver Enzymes Recent Labs      07/18/15   0603   TP  6.0*   ALB  3.1*   AP  94   SGOT  73*      Thyroid Studies Lab Results   Component Value Date/Time    TSH 5.470 07/18/2015 06:03 AM  Imaging:  Xr Chest Sngl V    Result Date: 07/17/2015  AP chest. HISTORY: Shortness of breath. FINDINGS: Cardiac silhouette is normal. The lungs clear of any acute process. No effusions. Bony structures intact.     IMPRESSION: Negative chest x-ray.     Ct Head Wo Cont    Result Date: 07/17/2015  Noncontrast head CT. Comparison 04/06/2015. HISTORY: CVA. TECHNIQUE: Noncontrast study. FINDINGS: Ventricles normal in size and position. No  intracranial hemorrhage or acute infarct. Bony calvarium intact.     IMPRESSION: No acute intracranial abnormalities.     Duplex Carotid Bilateral    Result Date: 07/18/2015                                                               Study ID: [id number]                          [facility]                          [address] [place], [place] [address]                             Carotid Duplex Report Name: KATELY, GRAFFAM Date: 11          /16/2016 08:23 AM MRN: [id number]                        Patient Location: 4219 DOB: February 13, 1951                    Age: 72 yrs Gender: Female                     Account #: [id number] Reason For Study: Cerebral Vascular Accident (CVA) Ordering Physician: Irving Burton Performed By: Allayne Butcher Interpretation Summary < 50% right internal carotid artery stenosis. Degree of stenosis is on the low end. Minimal plaque visualized. < 50% left internal carotid artery stenosis. Degree of stenosis is on the low end. Minimal plaque visualized. Antegrade flow noted in the vertebral arteries. Normal flow noted in the subclavian arteries. _____________________________________________________________________________ _ QUALITY/PROCEDURE Extracranial Carotid Duplex (62130). ICD 10- I63.8. HISTORY/SYMPTOMS HTN. CVA. PE. DVT. CAD. Coronary Artery Stents. Diabetes. Seizure Disorder. Fibromyalgia. Thyroid Disease. Lupus. RIGHT VELOCITIES Velocities in the common, internal, and external carotid arteries are all within normal limits. RIGHT EXTRACRANIAL There is intimal thickening but no significant atherosclerotic plaque noted in the right common carotid artery. There is homogenous, smooth atherosclerotic plaque noted in the right internal carotid artery. The Doppler flow velocities within the right internal carotid artery are within normal limits, i.e < 50% stenosis. Antegrade flow is noted in the right vertebral artery. LEFT  VELOCITIES Velocities in the common, internal, and external carotid arteries are all within normal limits. LEFT EXTRACRANIAL There is intimal thickening but no significant atherosclerotic plaque noted in the left common carotid artery. There is homogenous, smooth atherosclerotic plaque noted in the left internal carotid artery. The  Doppler flow velocities within the left internal carotid artery are within normal limits, i.e < 50% stenosis. Antegrade flow is noted in the left vertebral artery. Measurements and Calculations                              Right         No         Left    Unit                                        Laterality Prox CCA PSV                  61.6                   -176.0   cm/sec Prox CCA EDV                  24.0                    -25.1   cm/sec Dist CCA PSV                  70.4                    69.5    cm/sec Dist CCA EDV                  24.6                    27.4    cm/sec CCA ratio vel                 70.4                    176.0   cm/sec Prox ECA PSV                 -53.4                    -56.4   cm/sec Prox ECA EDV                 -13.0                    -8.0    cm/sec Prox ICA PSV                 -74.3                    -99.2   cm/sec Prox ICA EDV                 -38.4                    -39.4   cm/sec Mid ICA PSV                  -103.0                   -92.8   cm/sec Mid ICA EDV                  -42.4                    -41.4  cm/sec Dist ICA PSV                 -86.4                   -161.0   cm/sec Dist ICA EDV                 -38.9                    -53.7   cm/sec ICA ratio vel                103.0                    99.2    cm/sec Vertebral A PSV               49.9                    86.2    cm/sec Vertebral A EDV               19.0                    30.1    cm/sec ICA/CCA ratio                 1.5                     0.56 Prox SCLA PSV                -44.0                    -47.1   cm/sec Electronically signed byDr. Grier Mitts, MD   07/18/2015 06:04 PM        No results found for this or any previous visit.      Results from Hospital Encounter encounter on 04/06/15   [place] ABD LTD   Narrative History: Right upper quadrant pain    Sonographic evaluation of the right upper quadrant shows an echogenic liver.  There is cholecystectomy with common bile duct normal at 2 mm diameter.  Pancreas, aorta, IVC, right kidney normal.         Impression IMPRESSION: Fatty liver. Cholecystectomy without biliary dilatation or other  abnormality.            Treatment Team: Treatment Team: Attending Provider: Faythe Ghee, DO; Consulting Provider: Irving Burton, MD; Hospitalist: Faythe Ghee, DO; Physical Therapist: Hilarie Fredrickson, PT; Occupational Therapist: Langley Gauss, OT    Condition at discharge: Stable.     Disposition:  Home       PCP:  Lewis Moccasin, MD    Dr. Lewis Moccasin, MD, thank you for allowing [place] to participate in the care of this patient.  Please contact me through the hospital if questions arise.    Total time for DC was 45 minutes.      Faythe Ghee, D.O.  [facility]

## 2015-07-20 NOTE — Other (Signed)
[facility]  [facility]    Patient???s Name: [name] CourseVeronica E Mccaughey           Date of Birth: 09/06/1950    Primary Diagnosis: Acute CVA (cerebrovascular accident) [facility](HCC)                    Admit Date: 07/17/2015    Date of Face to Face:  July 20, 2015                    Medical Record Number: [id number]     Attending: Faythe GheeLisa Huang, DO      Physician Attestation:  To be filled out by physician who conducted Face-to Face encounter.    Current Problem List:  The encounter with the patient was in whole, or in part, for the following medical condition, which is the primary reason home health care (list medical condition):    Patient Active Hospital Problem List:   Acute CVA (cerebrovascular accident) (HCC) (07/17/2015)     1. Possible Acute cerebrovascular accident, history of CVAs with residual bilateral weakness per patient report, now appears to have worsened weakness on the right upper and lower extremities, including worsened imbalance.  2. Supratherapeutic INR.   3. Anemia, stable hemoglobin 8.6. No signs of bleeding.  4. Coronary artery disease, stable.  5. Hypertension.   6. Nausea vomiting.   6. History of deep vein thrombosis/pulmonary embolus.  7. History of cerebrovascular accident.  8. Diabetes mellitus type 2,   9. Chronic pain syndrome.  10. History of seizure disorder.   11. History of gastric ulcer status post partial gastrectomy.  12. History of fibromyalgia.  13. Questionable history of lupus, history of osteoporosis, history of dyslipidemia.                            Face to Face Encounter findings are related to primary reason for home care:   yes.     1. I certify that the patient needs intermittent care as follows: skilled nursing care:  teaching/training of disease process, medications, and diet, skilled observation/assessment, patient education, therapeutic drug monitoring and rehabilitative nursing  physical therapy: strengthening, stretching/ROM, transfer training,  gait/stair training, balance training and pt/caregiver education  occupational therapy:  ADL safety (ie. cooking, bathing, dressing), ROM and pt/caregiver education  speech therapy:  oral motor development, cognition training, swallowing education, articulation and pt/caregiver education    2. I certify that this patient is homebound, that is: 1) patient requires the use of a cane and walker device, special transportation, or assistance of another to leave the home; or 2) patient's condition makes leaving the home medically contraindicated; and 3) patient has a normal inability to leave the home and leaving the home requires considerable and taxing effort.  Patient may leave the home for infrequent and short duration for medical reasons, and occasional absences for non-medical reasons. Homebound status is due to the following functional limitations: Patient with strength deficits limiting the performance of all ADL's without caregiver assistance or the use of an assistive device.    3. I certify that this patient is under my care and that I, or a nurse practitioner or physician???s assistant, or clinical nurse specialist, or certified nurse midwife, working with me, had a Face-to-Face Encounter that meets the physician Face-to-Face Encounter requirements.  The following are the clinical findings from the Face-to-Face encounter that support the need for skilled services and  is a summary of the encounter:     See discharge summary and summary of the patient's illness    Electronically signed:  Owens Shark, RN  07/20/2015      THE FOLLOWING TO BE COMPLETED BY THE  PHYSICIAN:    I concur with the findings described above from the F2F encounter that this patient is homebound and in need of a skilled service.    Electronically signed: Faythe Ghee, DO  Certifying Physician:

## 2015-07-20 NOTE — Progress Notes (Signed)
Pharmacy dosing coumadin  INR 1.9--->1.6 today, 5mg  given last night. INR goal 2-3 Hx dvt, hx cva  Will order 6mg  tonight

## 2015-07-20 NOTE — Progress Notes (Signed)
Patient discharged. Discharged instructions reviewed with patient. Patient verbalized understanding. Patient vital sign stable upon discharged. Patient wheeled down by Nurse. Patient left with belongings.     07/20/15 1145   Vitals   Temp 98.7 ??F (37.1 ??C)   Temp Source Oral   Pulse (Heart Rate) 83   Heart Rate Source Monitor   Resp Rate 18   O2 Sat (%) 96 %   Level of Consciousness Alert   BP 1 Location Left arm   BP 1 Method Automatic   BP Patient Position At rest

## 2015-09-25 ENCOUNTER — Emergency Department: Admit: 2015-09-25 | Payer: MEDICAID | Primary: Internal Medicine

## 2015-09-25 ENCOUNTER — Inpatient Hospital Stay
Admit: 2015-09-25 | Discharge: 2015-09-25 | Disposition: A | Discharge status: Home / Self Care | Payer: MEDICAID | Attending: Emergency Medical Services

## 2015-09-25 DIAGNOSIS — J209 Acute bronchitis, unspecified: Secondary | ICD-10-CM

## 2015-09-25 LAB — METABOLIC PANEL, COMPREHENSIVE
ALT (SGPT): 24 U/L (ref 12–78)
AST (SGOT): 26 U/L (ref 15–37)
Albumin: 3.6 gm/dl (ref 3.4–5.0)
Alk. phosphatase: 98 U/L (ref 45–117)
BUN: 15 mg/dl (ref 7–25)
Bilirubin, total: 0.5 mg/dl (ref 0.2–1.0)
CO2: 23 mEq/L (ref 21–32)
Calcium: 8.9 mg/dl (ref 8.5–10.1)
Chloride: 111 mEq/L — ABNORMAL HIGH (ref 98–107)
Creatinine: 0.8 mg/dl (ref 0.6–1.3)
GFR est AA: >60
GFR est non-AA: >60
Glucose: 87 mg/dl (ref 74–106)
Potassium: 3.7 mEq/L (ref 3.5–5.1)
Protein, total: 7.4 gm/dl (ref 6.4–8.2)
Sodium: 143 mEq/L (ref 136–145)

## 2015-09-25 LAB — CBC WITH AUTOMATED DIFF
BASOPHILS: 0.9 % (ref 0–3)
EOSINOPHILS: 3.1 % (ref 0–5)
HCT: 32 % — ABNORMAL LOW (ref 37.0–50.0)
HGB: 10 gm/dl — ABNORMAL LOW (ref 13.0–17.2)
IMMATURE GRANULOCYTES: 0.2 % (ref 0.0–3.0)
LYMPHOCYTES: 40.5 % (ref 28–48)
MCH: 25.4 pg (ref 25.4–34.6)
MCHC: 31.3 gm/dl (ref 30.0–36.0)
MCV: 81.2 fL (ref 80.0–98.0)
MONOCYTES: 7.3 % (ref 1–13)
MPV: 10.9 fL — ABNORMAL HIGH (ref 6.0–10.0)
NEUTROPHILS: 48 % (ref 34–64)
NRBC: 0 (ref 0–0)
PLATELET: 363 10*3/uL (ref 140–450)
RBC: 3.94 M/uL (ref 3.60–5.20)
RDW-SD: 50.8 — ABNORMAL HIGH (ref 36.4–46.3)
WBC: 5.7 10*3/uL (ref 4.0–11.0)

## 2015-09-25 LAB — EKG, 12 LEAD, INITIAL
Atrial Rate: 86 {beats}/min
Calculated P Axis: 71 degrees
Calculated R Axis: 42 degrees
Calculated T Axis: 31 degrees
Diagnosis: NORMAL
P-R Interval: 144 ms
Q-T Interval: 368 ms
QRS Duration: 88 ms
QTC Calculation (Bezet): 440 ms
Ventricular Rate: 86 {beats}/min

## 2015-09-25 LAB — LACTIC ACID
Lactic Acid: 3.4 mmol/L — ABNORMAL HIGH (ref 0.4–2.0)
Lactic Acid: 1.4 mmol/L (ref 0.4–2.0)

## 2015-09-25 LAB — PROTHROMBIN TIME + INR
INR: 2.9 — ABNORMAL HIGH (ref 0.1–1.1)
Prothrombin time: 29.6 seconds — ABNORMAL HIGH (ref 11.5–14.0)

## 2015-09-25 MED ORDER — SODIUM CHLORIDE 0.9% BOLUS IV
0.9 % | INTRAVENOUS | Status: AC
Start: 2015-09-25 — End: 2015-09-25
  Administered 2015-09-25: 18:00:00 via INTRAVENOUS

## 2015-09-25 MED ORDER — SODIUM CHLORIDE 0.9 % IJ SYRG
Freq: Once | INTRAMUSCULAR | Status: AC
Start: 2015-09-25 — End: 2015-09-25
  Administered 2015-09-25: 15:00:00 via INTRAVENOUS

## 2015-09-25 MED ORDER — ONDANSETRON (PF) 4 MG/2 ML INJECTION
4 mg/2 mL | Freq: Once | INTRAMUSCULAR | Status: AC
Start: 2015-09-25 — End: 2015-09-25
  Administered 2015-09-25: 18:00:00 via INTRAVENOUS

## 2015-09-25 MED ORDER — ALBUTEROL SULFATE HFA 90 MCG/ACTUATION AEROSOL INHALER
90 mcg/actuation | RESPIRATORY_TRACT | 0 refills | Status: DC | PRN
Start: 2015-09-25 — End: 2016-05-09

## 2015-09-25 MED ORDER — AZITHROMYCIN 250 MG TAB
250 mg | ORAL_TABLET | ORAL | 0 refills | Status: AC
Start: 2015-09-25 — End: 2015-09-30

## 2015-09-25 MED ORDER — PREDNISONE 20 MG TAB
20 mg | ORAL_TABLET | Freq: Every day | ORAL | 0 refills | Status: AC
Start: 2015-09-25 — End: 2015-09-30

## 2015-09-25 MED ORDER — ALBUTEROL SULFATE HFA 90 MCG/ACTUATION AEROSOL INHALER
90 mcg/actuation | RESPIRATORY_TRACT | 0 refills | Status: DC | PRN
Start: 2015-09-25 — End: 2016-07-14

## 2015-09-25 MED ORDER — METHYLPREDNISOLONE (PF) 125 MG/2 ML IJ SOLR
125 mg/2 mL | Freq: Once | INTRAMUSCULAR | Status: AC
Start: 2015-09-25 — End: 2015-09-25
  Administered 2015-09-25: 14:00:00 via INTRAVENOUS

## 2015-09-25 MED ORDER — MORPHINE 2 MG/ML INJECTION
2 mg/mL | INTRAMUSCULAR | Status: AC
Start: 2015-09-25 — End: 2015-09-25
  Administered 2015-09-25: 16:00:00 via INTRAVENOUS

## 2015-09-25 MED ORDER — LEVOFLOXACIN IN D5W 750 MG/150 ML IV PIGGY BACK
750 mg/150 mL | INTRAVENOUS | Status: AC
Start: 2015-09-25 — End: 2015-09-25
  Administered 2015-09-25: 18:00:00 via INTRAVENOUS

## 2015-09-25 MED ORDER — IPRATROPIUM-ALBUTEROL 2.5 MG-0.5 MG/3 ML NEB SOLUTION
2.5 mg-0.5 mg/3 ml | RESPIRATORY_TRACT | Status: AC
Start: 2015-09-25 — End: 2015-09-25
  Administered 2015-09-25: 15:00:00 via RESPIRATORY_TRACT

## 2015-09-25 MED ORDER — MORPHINE 4 MG/ML SYRINGE
4 mg/mL | INTRAMUSCULAR | Status: AC
Start: 2015-09-25 — End: 2015-09-25
  Administered 2015-09-25: 18:00:00 via INTRAVENOUS

## 2015-09-25 MED FILL — ONDANSETRON (PF) 4 MG/2 ML INJECTION: 4 mg/2 mL | INTRAMUSCULAR | Qty: 2

## 2015-09-25 MED FILL — MORPHINE 2 MG/ML INJECTION: 2 mg/mL | INTRAMUSCULAR | Qty: 1

## 2015-09-25 MED FILL — IPRATROPIUM-ALBUTEROL 2.5 MG-0.5 MG/3 ML NEB SOLUTION: 2.5 mg-0.5 mg/3 ml | RESPIRATORY_TRACT | Qty: 3

## 2015-09-25 MED FILL — LEVOFLOXACIN IN D5W 750 MG/150 ML IV PIGGY BACK: 750 mg/150 mL | INTRAVENOUS | Qty: 150

## 2015-09-25 MED FILL — MORPHINE 4 MG/ML SYRINGE: 4 mg/mL | INTRAMUSCULAR | Qty: 1

## 2015-09-25 MED FILL — SOLU-MEDROL (PF) 125 MG/2 ML SOLUTION FOR INJECTION: 125 mg/2 mL | INTRAMUSCULAR | Qty: 2

## 2015-09-25 NOTE — ED Provider Notes (Addendum)
[facility]  Emergency Department Treatment Report    Patient: [name] [name] Age: 65 y.o. Sex: female    Date of Birth: 1951-05-03 Admit Date: 09/25/2015 PCP: Monte Fantasia, MD   MRN: [id number]  CSN: [id number]     Room: ER20/ER20 Time Dictated: 11:59 AM        Chief Complaint   Chief Complaint   Patient presents with   ??? Shortness of Breath   ??? Cough   ??? Chest Pain       History of Present Illness   65 y.o. female presents to the emergency department after waking up this morning cleaning of extreme shortness of breath, cough, chest pain. She states she is having difficulty breathing. SHe states the pain is a 6 out of 10 in the center of her chest that radiates to her back that is worse as she coughs or tries to breathe. She did not take anything prior to coming to the emergency department. 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 SHe should also admits to COPD. He denies any fever or shaking or chills or difficulty breathing prior to this event this morning. SHe denies any abdominal pain nausea vomiting or diarrhea. SHe denies any leg pain or calf swelling.    Review of Systems   Review of Systems   Constitutional: Negative for chills and fever.   HENT: Positive for congestion.    Eyes: Negative for blurred vision.   Respiratory: Positive for cough, sputum production and shortness of breath.    Cardiovascular: Positive for chest pain. Negative for palpitations and leg swelling.   Gastrointestinal: Negative for abdominal pain, heartburn, nausea and vomiting.   Genitourinary: Negative for dysuria, frequency and urgency.   Musculoskeletal: Negative for back pain, falls and neck pain.   Skin: Negative for itching and rash.   Neurological: Negative for dizziness and weakness.   Endo/Heme/Allergies: Does not bruise/bleed easily.   Psychiatric/Behavioral: Negative for depression, substance abuse and suicidal ideas.       Past Medical/Surgical History      Past Medical History   Diagnosis Date   ??? Acute hypercapnic respiratory failure ([place]) 02/05/2013     W/ Encephalopathy requiring BiPAP, Etiology Uncertain however Possible Narcotic Benzodiazipine Related?    ??? Adjustment disorder with mixed anxiety and depressed mood    ??? Aneurysm of internal carotid artery    ??? Arthritis of knee    ??? Asthma    ??? Autoimmune disease ([place])    ??? CAD (coronary artery disease)    ??? Cervicalgia    ??? Chest pain 03/17/2014     Dobutamine Stress Echo: NORMAL WALL MOTION AND GLOBAL SYSTOLIC FUNCTION AT REST AND AFTER?? STRESS/EXERCISE WITH APPROPRIATE AUGMENTATION OF FUNCTION IN ALL SEGMENTS. NO EVIDENCE OF INDUCIBLE ISCHEMIA AT ADEQUATE HEART RATE WITH DOBUTAMINE STRESS.??            ??? Chronic low back pain 10/22/2014     [facility] MRI Lumbar w/o contrast: Moderate facet arthropathy at L4-L5 and L5-S1. Mild/moderate foraminal stenosis at these levels. Disc bulge with annular tear at L4-L5.    ??? Chronic pain syndrome    ??? Coccydynia    ??? Diabetes mellitus      NIDDM   ??? DVT (deep venous thrombosis) ([place]) 05/19/2007     [place] CTA: 1. Small nonocclusive pulmonary embolus in a subsegmental branch to the left lower lobe. Larger filling defect in a segmental branch to the right  lower lobe, not as low in attenuation as is normally seen with a pulmonary embolus, however is seen in multiple planes and remains concerning for an additional nonocclusive pulmonary embolus.   ??? Fibromyalgia    ??? Gastric ulcer    ??? Gastritis 01/03/2015     [place] Admission, EGD + H. Pylori/Gastritis   ??? Generalized osteoarthritis of multiple sites    ??? GI bleed 01/01/2015     [facility] Admit 5/2-01/07/2015   ??? H. pylori infection 01/03/2015     [facility] Admission, EGD + H. Pylori/Gastritis   ??? Hemiparesis, right ([place])    ??? Hyperlipidemia    ??? Hypertension    ??? Irregular sleep-wake rhythm    ??? Knee joint pain    ??? Lumbar spondylosis      Orthopedic Spine Surgeon: Dr. Barnabas Lister L. Siegel    ??? Lupus ([place])    ??? Muscle spasm    ??? Normocytic anemia       Chronic Iron Deficiency Anemia   ??? Osteoporosis    ??? Pancreatitis    ??? Peripheral neuropathy ([place])    ??? Pulmonary embolism (HCC)      Multiple   ??? Seizure ([place])    ??? Seizures ([place])    ??? Sensory ataxia    ??? SLE (systemic lupus erythematosus) ([place])    ??? Stroke [facility])    ??? Subclinical hypothyroidism    ??? Vitamin D deficiency      Past Surgical History   Procedure Laterality Date   ??? Hx hysterectomy     ??? Hx cholecystectomy     ??? Pr cardiac surg procedure unlist       Cardiac Cath PCI w/ Stents   ??? Hx gi       Partial Gastrectomy    ??? Hx gi  01/03/2015     [facility] EGD by Dr. Darnell Level D. Waldholtz: S/P B-2 Gastric Resection, Gastritis in small remnant, Bx of Jejunum r/o Celiac Disease and of Stomach. + Gastritis + H. Pylori       Social History     Social History     Social History   ??? Marital status: DIVORCED     Spouse name: N/A   ??? Number of children: N/A   ??? Years of education: N/A     Social History Main Topics   ??? Smoking status: Former Smoker   ??? Smokeless tobacco: Never Used   ??? Alcohol use No   ??? Drug use: No   ??? Sexual activity: Not on file     Other Topics Concern   ??? Not on file     Social History Narrative       Family History     Family History   Problem Relation Age of Onset   ??? Diabetes Maternal Grandmother        Current Medications     Prior to Admission Medications   Prescriptions Last Dose Informant Patient Reported? Taking?   Cholecalciferol, Vitamin D3, (VITAMIN D) 1,000 unit Cap   Yes No   Sig: Take 50,000 Units by mouth every seven (7) days.   FLUoxetine (PROZAC) 40 mg capsule   No No   Sig: Take 1 Cap by mouth daily.   albuterol (PROVENTIL HFA) 90 mcg/Actuation inhaler   Yes No   Sig: Take 1 Puff by inhalation as needed.   albuterol-ipratropium (DUO-NEB) 0.5 mg-3 mg(2.5 mg base)/3 mL nebulizer solution   Yes No   Sig: 3 mL by Nebulization route once.  alendronate-vitamin d3 70-2,800 mg-unit per tablet   Yes No   Sig: Take 1 Tab by mouth every seven (7) days.    amitriptyline (ELAVIL) 75 mg tablet   No No   Sig: Take 1 Tab by mouth nightly.   Patient taking differently: Take 50 mg by mouth nightly.   citalopram (CELEXA) 20 mg tablet   Yes No   Sig: Take 20 mg by mouth daily.   cyclobenzaprine (FLEXERIL) 5 mg tablet   Yes No   Sig: Take 10 mg by mouth three (3) times daily as needed for Muscle Spasm(s).   diphenhydrAMINE (BENADRYL) 25 mg capsule   Yes No   Sig: Take 25 mg by mouth every six (6) hours as needed.   ferrous gluconate 324 mg (36 mg iron) tab   No No   Sig: Take 1 Tab by mouth Before breakfast and dinner.   lamoTRIgine (LAMICTAL) 100 mg tablet   No No   Sig: Take 1 Tab by mouth two (2) times a day.   oxyCODONE IR (OXY-IR) 15 mg immediate release tablet   No No   Sig: Take 1 Tab by mouth four (4) times daily. Max Daily Amount: 60 mg.   simvastatin (ZOCOR) 80 mg tablet   Yes No   Sig: Take 80 mg by mouth nightly.   warfarin (COUMADIN) 5 mg tablet   No No   Sig: Take 2 tablets ('10mg'$ ) daily on 10/15, 10/16 & have INR checked 10/17      Facility-Administered Medications: None       Allergies     Allergies   Allergen Reactions   ??? Aspirin Not Reported This Time     Because of hx ulcers per patient   ??? Opana [Oxymorphone] Rash and Itching   ??? Tylenol [Acetaminophen] Itching       Physical Exam   ED Triage Vitals   Enc Vitals Group      BP 09/25/15 0855 143/80      Pulse (Heart Rate) 09/25/15 0855 100      Resp Rate 09/25/15 0855 30      Temp 09/25/15 0855 97.8 ??F (36.6 ??C)      Temp src --       O2 Sat (%) 09/25/15 0855 93 %      Weight 09/25/15 0855 123 lb      Height 09/25/15 0855 '5\' 7"'$      Physical Exam   Constitutional: She is oriented to person, place, and time. She appears distressed.   Patient is anxious, coughing, and spitting into a bag   HENT:   Head: Normocephalic.   Eyes: Conjunctivae are normal.   Neck: Normal range of motion.   Cardiovascular: Regular rhythm, normal heart sounds and intact distal pulses.    No murmur heard.   Pulmonary/Chest: She is in respiratory distress. She has wheezes.   Tachypneic  Positive bronchospasm   Abdominal: Soft. Bowel sounds are normal.   Musculoskeletal: Normal range of motion. She exhibits no edema.   Neurological: She is alert and oriented to person, place, and time.   Skin: Skin is warm and dry. No erythema.   Psychiatric: Affect normal.       Impression and Management Plan   We'll check laboratory values to evaluate for any infection, dehydration or likely imbalance. We'll check a chest x-ray to evaluate for pneumonic process or aspiration pneumonia. Patient given IV Solu-Medrol, IV morphine's as well as an albuterol treatment to decrease the bronchospasm. Patient  continues to cough and be in respiratory distress may possibly need to BiPAP or intubate the patient. Check a EKG as well as a cardiac troponin to evaluate for any cardiac etiology.    Procedures  none    Diagnostic Studies   Lab:   Recent Results (from the past 12 hour(s))   EKG, 12 LEAD, INITIAL    Collection Time: 09/25/15  9:03 AM   Result Value Ref Range    Ventricular Rate 86 BPM    Atrial Rate 86 BPM    P-R Interval 144 ms    QRS Duration 88 ms    Q-T Interval 368 ms    QTC Calculation (Bezet) 440 ms    Calculated P Axis 71 degrees    Calculated R Axis 42 degrees    Calculated T Axis 31 degrees    Diagnosis       Normal sinus rhythm  Possible Left atrial enlargement  When compared with ECG of 17-Jul-2015 20:44,  No significant change was found  Confirmed by Marrion Coy, M.D., Ellin Saba. ([id number]) on 09/25/2015 12:17:16 PM     CBC WITH AUTOMATED DIFF    Collection Time: 09/25/15  9:47 AM   Result Value Ref Range    WBC 5.7 4.0 - 11.0 1000/mm3    RBC 3.94 3.60 - 5.20 M/uL    HGB 10.0 (L) 13.0 - 17.2 gm/dl    HCT 32.0 (L) 37.0 - 50.0 %    MCV 81.2 80.0 - 98.0 fL    MCH 25.4 25.4 - 34.6 pg    MCHC 31.3 30.0 - 36.0 gm/dl    PLATELET 363 140 - 450 1000/mm3    MPV 10.9 (H) 6.0 - 10.0 fL    RDW-SD 50.8 (H) 36.4 - 46.3      NRBC 0 0 - 0       IMMATURE GRANULOCYTES 0.2 0.0 - 3.0 %    NEUTROPHILS 48.0 34 - 64 %    LYMPHOCYTES 40.5 28 - 48 %    MONOCYTES 7.3 1 - 13 %    EOSINOPHILS 3.1 0 - 5 %    BASOPHILS 0.9 0 - 3 %   METABOLIC PANEL, COMPREHENSIVE    Collection Time: 09/25/15  9:47 AM   Result Value Ref Range    Sodium 143 136 - 145 mEq/L    Potassium 3.7 3.5 - 5.1 mEq/L    Chloride 111 (H) 98 - 107 mEq/L    CO2 23 21 - 32 mEq/L    Glucose 87 74 - 106 mg/dl    BUN 15 7 - 25 mg/dl    Creatinine 0.8 0.6 - 1.3 mg/dl    GFR est AA >60.0      GFR est non-AA >60      Calcium 8.9 8.5 - 10.1 mg/dl    AST 26 15 - 37 U/L    ALT 24 12 - 78 U/L    Alk. phosphatase 98 45 - 117 U/L    Bilirubin, total 0.5 0.2 - 1.0 mg/dl    Protein, total 7.4 6.4 - 8.2 gm/dl    Albumin 3.6 3.4 - 5.0 gm/dl   PROTHROMBIN TIME + INR    Collection Time: 09/25/15  9:47 AM   Result Value Ref Range    Prothrombin time 29.6 (H) 11.5 - 14.0 seconds    INR 2.9 (H) 0.1 - 1.1     LACTIC ACID, PLASMA    Collection Time: 09/25/15 10:06 AM   Result Value Ref  Range    Lactic Acid 3.4 (H) 0.4 - 2.0 mmol/L   LACTIC ACID, PLASMA    Collection Time: 09/25/15  3:01 PM   Result Value Ref Range    Lactic Acid 1.4 0.4 - 2.0 mmol/L     Recent Results (from the past 12 hour(s))   CBC WITH AUTOMATED DIFF    Collection Time: 09/25/15  9:47 AM   Result Value Ref Range    WBC 5.7 4.0 - 11.0 1000/mm3    RBC 3.94 3.60 - 5.20 M/uL    HGB 10.0 (L) 13.0 - 17.2 gm/dl    HCT 32.0 (L) 37.0 - 50.0 %    MCV 81.2 80.0 - 98.0 fL    MCH 25.4 25.4 - 34.6 pg    MCHC 31.3 30.0 - 36.0 gm/dl    PLATELET 363 140 - 450 1000/mm3    MPV 10.9 (H) 6.0 - 10.0 fL    RDW-SD 50.8 (H) 36.4 - 46.3      NRBC 0 0 - 0      IMMATURE GRANULOCYTES 0.2 0.0 - 3.0 %    NEUTROPHILS 48.0 34 - 64 %    LYMPHOCYTES 40.5 28 - 48 %    MONOCYTES 7.3 1 - 13 %    EOSINOPHILS 3.1 0 - 5 %    BASOPHILS 0.9 0 - 3 %   METABOLIC PANEL, COMPREHENSIVE    Collection Time: 09/25/15  9:47 AM   Result Value Ref Range    Sodium 143 136 - 145 mEq/L     Potassium 3.7 3.5 - 5.1 mEq/L    Chloride 111 (H) 98 - 107 mEq/L    CO2 23 21 - 32 mEq/L    Glucose 87 74 - 106 mg/dl    BUN 15 7 - 25 mg/dl    Creatinine 0.8 0.6 - 1.3 mg/dl    GFR est AA >60.0      GFR est non-AA >60      Calcium 8.9 8.5 - 10.1 mg/dl    AST 26 15 - 37 U/L    ALT 24 12 - 78 U/L    Alk. phosphatase 98 45 - 117 U/L    Bilirubin, total 0.5 0.2 - 1.0 mg/dl    Protein, total 7.4 6.4 - 8.2 gm/dl    Albumin 3.6 3.4 - 5.0 gm/dl   PROTHROMBIN TIME + INR    Collection Time: 09/25/15  9:47 AM   Result Value Ref Range    Prothrombin time 29.6 (H) 11.5 - 14.0 seconds    INR 2.9 (H) 0.1 - 1.1     LACTIC ACID, PLASMA    Collection Time: 09/25/15 10:06 AM   Result Value Ref Range    Lactic Acid 3.4 (H) 0.4 - 2.0 mmol/L       Imaging:    Xr Chest Sngl V    Result Date: 09/25/2015  CHEST X-RAY, (frontal view): HISTORY: Shortness of breath, cough COMPARISON: 07/17/2015 radiograph and CT 06/14/2015     IMPRESSION: Opacity in the right cardiophrenic angle may correspond to scarring noted on prior CT. Developing consolidation less likely. No pleural effusion seen. Cardiac outline within normal limits.  Follow up after treatment recommended.       Medical Decision Making/ED Course   Patient developed no further cough or bronchospasm after reading treatment with lidocaine and medications given in the emergency department. Her oxygen saturation rate remained in the high 90s on room air throughout her coughing and bronchospasms. Patients lab  values were negative for any signs of infection, or electrolyte abnormalities or severe dehydration. [place] initial lactate was 3.4 we have no source of infection patient was given 1 L of normal saline and repeat lactate was 1.4. Patient given dose of IV Levaquin cover for bronchitis versus pneumonia. Patient also given prescriptions for by mouth Zithromax as well as by mouth prednisone and an albuterol inhaler. Patient states she is feeling so much better after  medications given in the emergency department she is agreeable to being discharged. Will follow-up with her primary care doctor within the next couple of days or return to the emergency department for any further bronchospasm or cough.    Final Diagnosis        ICD-10-CM ICD-9-CM   1. Acute bronchospasm J98.01 519.11   2. SOB (shortness of breath) R06.02 786.05   3. Acute bronchitis, unspecified organism J20.9 466.0     Disposition   Disposition and plan  Patient was discharged home in stable condition with discharge instructions on the same. You have been evaluated in the Emergency Department and no immediately dangerous condition has been found.  Because some conditions may not show themselves initially it is important that you follow up with your primary care doctor or the assigned specialist for further evaluation, ongoing care, and treatment if needed.  If you have any change or worsening of your condition, or any new concerns of any kind, do not wait to follow up with the primary care doctor or specialist and return to the ER immediately.    Return to the ER if condition worsens or new symptoms develop.  Follow up with primary care as discussed.       Current Discharge Medication List      CONTINUE these medications which have NOT CHANGED    Details   oxyCODONE IR (OXY-IR) 15 mg immediate release tablet Take 1 Tab by mouth four (4) times daily. Max Daily Amount: 60 mg.  Qty: 10 Tab, Refills: 0      warfarin (COUMADIN) 5 mg tablet Take 2 tablets ('10mg'$ ) daily on 10/15, 10/16 & have INR checked 10/17  Qty: 1 Tab, Refills: 0      citalopram (CELEXA) 20 mg tablet Take 20 mg by mouth daily.      cyclobenzaprine (FLEXERIL) 5 mg tablet Take 10 mg by mouth three (3) times daily as needed for Muscle Spasm(s).      simvastatin (ZOCOR) 80 mg tablet Take 80 mg by mouth nightly.      diphenhydrAMINE (BENADRYL) 25 mg capsule Take 25 mg by mouth every six (6) hours as needed.       FLUoxetine (PROZAC) 40 mg capsule Take 1 Cap by mouth daily.  Qty: 30 Cap, Refills: 0      lamoTRIgine (LAMICTAL) 100 mg tablet Take 1 Tab by mouth two (2) times a day.  Qty: 60 Tab, Refills: 0      ferrous gluconate 324 mg (36 mg iron) tab Take 1 Tab by mouth Before breakfast and dinner.  Qty: 60 Tab, Refills: 2      alendronate-vitamin d3 70-2,800 mg-unit per tablet Take 1 Tab by mouth every seven (7) days.      amitriptyline (ELAVIL) 75 mg tablet Take 1 Tab by mouth nightly.  Qty: 30 Tab, Refills: 1      albuterol (PROVENTIL HFA) 90 mcg/Actuation inhaler Take 1 Puff by inhalation as needed.      albuterol-ipratropium (DUO-NEB) 0.5 mg-3 mg(2.5 mg base)/3 mL nebulizer solution  3 mL by Nebulization route once.      Cholecalciferol, Vitamin D3, (VITAMIN D) 1,000 unit Cap Take 50,000 Units by mouth every seven (7) days.           Current Discharge Medication List      START taking these medications    Details   azithromycin (ZITHROMAX) 250 mg tablet Take as directed  Qty: 6 Tab, Refills: 0      predniSONE (DELTASONE) 20 mg tablet Take 2 Tabs by mouth daily for 5 days. With Breakfast  Qty: 10 Tab, Refills: 0         CONTINUE these medications which have CHANGED    Details   !! albuterol (PROVENTIL HFA, VENTOLIN HFA, PROAIR HFA) 90 mcg/actuation inhaler Take 2 Puffs by inhalation every four (4) hours as needed for Wheezing.  Qty: 1 Inhaler, Refills: 0      !! albuterol (PROVENTIL HFA) 90 mcg/actuation inhaler Take 1 Puff by inhalation as needed.  Qty: 1 Inhaler, Refills: 0       !! - Potential duplicate medications found. Please discuss with provider.      CONTINUE these medications which have NOT CHANGED    Details   oxyCODONE IR (OXY-IR) 15 mg immediate release tablet Take 1 Tab by mouth four (4) times daily. Max Daily Amount: 60 mg.  Qty: 10 Tab, Refills: 0      warfarin (COUMADIN) 5 mg tablet Take 2 tablets ('10mg'$ ) daily on 10/15, 10/16 & have INR checked 10/17  Qty: 1 Tab, Refills: 0       citalopram (CELEXA) 20 mg tablet Take 20 mg by mouth daily.      cyclobenzaprine (FLEXERIL) 5 mg tablet Take 10 mg by mouth three (3) times daily as needed for Muscle Spasm(s).      simvastatin (ZOCOR) 80 mg tablet Take 80 mg by mouth nightly.      diphenhydrAMINE (BENADRYL) 25 mg capsule Take 25 mg by mouth every six (6) hours as needed.      FLUoxetine (PROZAC) 40 mg capsule Take 1 Cap by mouth daily.  Qty: 30 Cap, Refills: 0      lamoTRIgine (LAMICTAL) 100 mg tablet Take 1 Tab by mouth two (2) times a day.  Qty: 60 Tab, Refills: 0      ferrous gluconate 324 mg (36 mg iron) tab Take 1 Tab by mouth Before breakfast and dinner.  Qty: 60 Tab, Refills: 2      alendronate-vitamin d3 70-2,800 mg-unit per tablet Take 1 Tab by mouth every seven (7) days.      amitriptyline (ELAVIL) 75 mg tablet Take 1 Tab by mouth nightly.  Qty: 30 Tab, Refills: 1      albuterol-ipratropium (DUO-NEB) 0.5 mg-3 mg(2.5 mg base)/3 mL nebulizer solution 3 mL by Nebulization route once.      Cholecalciferol, Vitamin D3, (VITAMIN D) 1,000 unit Cap Take 50,000 Units by mouth every seven (7) days.             The patient was personally evaluated by myself and Dr. Evonnie Pat who agrees with the above assessment and plan.    Dragon medical dictation software was used for portions of this report. Unintended errors may occur.     Hilton Cork, NP  September 25, 2015      My signature above authenticates this document and my orders, the final ??  diagnosis (es), discharge prescription (s), and instructions in the Epic ??  record.  If you have any questions  please contact [phone].  ??  Nursing notes have been reviewed by the physician/ advanced practice Clinician.

## 2015-09-25 NOTE — ED Triage Notes (Signed)
Patient states she woke with sudden onset of SOB and cough.

## 2015-09-25 NOTE — ED Notes (Signed)
Patient appears to be feeling much better.    ATB started.

## 2015-09-25 NOTE — ED Notes (Signed)
IV antibiotic finished at this time.  Lactic acid repeat being drawn.

## 2015-09-25 NOTE — ED Notes (Signed)
I have reviewed discharge instructions with the patient.  The patient verbalized understanding.    Patient ambulated to the front lobby--gait steady--will be awaiting [employer] Cab.

## 2015-09-25 NOTE — ED Notes (Signed)
Patient given surgical cap, footies and extra gowns per request.

## 2015-11-30 ENCOUNTER — Inpatient Hospital Stay
Admit: 2015-11-30 | Discharge: 2015-11-30 | Disposition: A | Discharge status: Home / Self Care | Payer: MEDICAID | Attending: Emergency Medicine

## 2015-11-30 DIAGNOSIS — M659 Synovitis and tenosynovitis, unspecified: Secondary | ICD-10-CM

## 2015-11-30 MED ORDER — IBUPROFEN 400 MG TAB
400 mg | ORAL | Status: DC
Start: 2015-11-30 — End: 2015-11-30

## 2015-11-30 MED ORDER — PREDNISONE 20 MG TAB
20 mg | ORAL_TABLET | ORAL | 0 refills | Status: DC
Start: 2015-11-30 — End: 2015-12-29

## 2015-11-30 NOTE — ED Notes (Signed)
1:51 PM  11/30/15     Discharge instructions given to patient (name) with verbalization of understanding. Patient accompanied by self.  Patient discharged with the following prescriptions prednisone. Patient discharged to home (destination).      Myna Brightallie Ann Coviello, RN

## 2015-11-30 NOTE — ED Notes (Signed)
Patient reports she cannot take motrin, [employer] students notified.

## 2015-11-30 NOTE — ED Triage Notes (Signed)
Patient complains of right hand swelling and pain that started yesterday and has gotten worse today, she cannot use the hand.

## 2015-11-30 NOTE — ED Provider Notes (Signed)
[facility]  Emergency Department Treatment Report    Patient: [name] [name] Age: 65 y.o. Sex: female    Date of Birth: 1951/09/01 Admit Date: 11/30/2015 PCP: Lewis Moccasin, MD   MRN: [id number]  CSN: [id number]     Room: ER42/ER42 Time Dictated: 1:25 PM      Chief Complaint   Pain right hand  History of Present Illness   65 y.o. female and complains of severe pain right hand which started yesterday evening, pain is constant currently in the right thumb radiating up the forearm, not associated with any trauma but states had been cleaning her house and doing a lot of scrubbing 2 days ago and again yesterday morning. She did not take anything for the pain. She states she had some swelling of the hands yesterday with swelling decreased.    Review of Systems   Constitutional: No fever  Musculoskeletal: Pain right hand  Integumentary: No rash  Neurological: No numbness of digits  Denies complaints in all other systems.    Past Medical/Surgical History     Past Medical History:   Diagnosis Date   ??? Acute hypercapnic respiratory failure (HCC) 02/05/2013    W/ Encephalopathy requiring BiPAP, Etiology Uncertain however Possible Narcotic Benzodiazipine Related?    ??? Adjustment disorder with mixed anxiety and depressed mood    ??? Aneurysm of internal carotid artery    ??? Arthritis of knee    ??? Asthma    ??? Autoimmune disease (HCC)    ??? CAD (coronary artery disease)    ??? Cervicalgia    ??? Chest pain 03/17/2014    Dobutamine Stress Echo: NORMAL WALL MOTION AND GLOBAL SYSTOLIC FUNCTION AT REST AND AFTER?? STRESS/EXERCISE WITH APPROPRIATE AUGMENTATION OF FUNCTION IN ALL SEGMENTS. NO EVIDENCE OF INDUCIBLE ISCHEMIA AT ADEQUATE HEART RATE WITH DOBUTAMINE STRESS.??            ??? Chronic low back pain 10/22/2014    [facility] MRI Lumbar w/o contrast: Moderate facet arthropathy at L4-L5 and L5-S1. Mild/moderate foraminal stenosis at these levels. Disc bulge with annular tear at L4-L5.    ??? Chronic pain syndrome     ??? Coccydynia    ??? Diabetes mellitus     NIDDM   ??? DVT (deep venous thrombosis) (HCC) 05/19/2007    [facility] CTA: 1. Small nonocclusive pulmonary embolus in a subsegmental branch to the left lower lobe. Larger filling defect in a segmental branch to the right lower lobe, not as low in attenuation as is normally seen with a pulmonary embolus, however is seen in multiple planes and remains concerning for an additional nonocclusive pulmonary embolus.   ??? Fibromyalgia    ??? Gastric ulcer    ??? Gastritis 01/03/2015    [facility] Admission, EGD + H. Pylori/Gastritis   ??? Generalized osteoarthritis of multiple sites    ??? GI bleed 01/01/2015    [facility] Admit 5/2-01/07/2015   ??? H. pylori infection 01/03/2015    [facility] Admission, EGD + H. Pylori/Gastritis   ??? Hemiparesis, right (HCC)    ??? Hyperlipidemia    ??? Hypertension    ??? Irregular sleep-wake rhythm    ??? Knee joint pain    ??? Lumbar spondylosis     Orthopedic Spine Surgeon: Dr. Ree Kida L. Siegel    ??? Lupus (HCC)    ??? Muscle spasm    ??? Normocytic anemia     Chronic Iron Deficiency Anemia   ??? Osteoporosis    ??? Pancreatitis    ???  Peripheral neuropathy (HCC)    ??? Pulmonary embolism (HCC)     Multiple   ??? Seizure (HCC)    ??? Seizures (HCC)    ??? Sensory ataxia    ??? SLE (systemic lupus erythematosus) (HCC)    ??? Stroke [facility])    ??? Subclinical hypothyroidism    ??? Vitamin D deficiency      Past Surgical History:   Procedure Laterality Date   ??? CARDIAC SURG PROCEDURE UNLIST      Cardiac Cath PCI w/ Stents   ??? HX CHOLECYSTECTOMY     ??? HX GI      Partial Gastrectomy    ??? HX GI  01/03/2015    [facility] EGD by Dr. Smitty Cords D. Waldholtz: S/P B-2 Gastric Resection, Gastritis in small remnant, Bx of Jejunum r/o Celiac Disease and of Stomach. + Gastritis + H. Pylori   ??? HX HYSTERECTOMY         Social History     Social History     Social History   ??? Marital status: DIVORCED     Spouse name: N/A   ??? Number of children: N/A   ??? Years of education: N/A     Social History Main Topics   ??? Smoking status: Former Smoker    ??? Smokeless tobacco: Never Used   ??? Alcohol use No   ??? Drug use: No   ??? Sexual activity: Not on file     Other Topics Concern   ??? Not on file     Social History Narrative       Family History     Family History   Problem Relation Age of Onset   ??? Diabetes Maternal Grandmother        Current Medications     Prior to Admission Medications   Prescriptions Last Dose Informant Patient Reported? Taking?   Cholecalciferol, Vitamin D3, (VITAMIN D) 1,000 unit Cap   Yes No   Sig: Take 50,000 Units by mouth every seven (7) days.   FLUoxetine (PROZAC) 40 mg capsule   No No   Sig: Take 1 Cap by mouth daily.   albuterol (PROVENTIL HFA) 90 mcg/actuation inhaler   No No   Sig: Take 1 Puff by inhalation as needed.   albuterol (PROVENTIL HFA, VENTOLIN HFA, PROAIR HFA) 90 mcg/actuation inhaler   No No   Sig: Take 2 Puffs by inhalation every four (4) hours as needed for Wheezing.   albuterol-ipratropium (DUO-NEB) 0.5 mg-3 mg(2.5 mg base)/3 mL nebulizer solution   Yes No   Sig: 3 mL by Nebulization route once.   alendronate-vitamin d3 70-2,800 mg-unit per tablet   Yes No   Sig: Take 1 Tab by mouth every seven (7) days.   amitriptyline (ELAVIL) 75 mg tablet   No No   Sig: Take 1 Tab by mouth nightly.   Patient taking differently: Take 50 mg by mouth nightly.   citalopram (CELEXA) 20 mg tablet   Yes No   Sig: Take 20 mg by mouth daily.   cyclobenzaprine (FLEXERIL) 5 mg tablet   Yes No   Sig: Take 10 mg by mouth three (3) times daily as needed for Muscle Spasm(s).   diphenhydrAMINE (BENADRYL) 25 mg capsule   Yes No   Sig: Take 25 mg by mouth every six (6) hours as needed.   ferrous gluconate 324 mg (36 mg iron) tab   No No   Sig: Take 1 Tab by mouth Before breakfast and dinner.  lamoTRIgine (LAMICTAL) 100 mg tablet   No No   Sig: Take 1 Tab by mouth two (2) times a day.   oxyCODONE IR (OXY-IR) 15 mg immediate release tablet   No No   Sig: Take 1 Tab by mouth four (4) times daily. Max Daily Amount: 60 mg.    simvastatin (ZOCOR) 80 mg tablet   Yes No   Sig: Take 80 mg by mouth nightly.   warfarin (COUMADIN) 5 mg tablet   No No   Sig: Take 2 tablets (10mg ) daily on 10/15, 10/16 & have INR checked 10/17      Facility-Administered Medications: None       Allergies     Allergies   Allergen Reactions   ??? Aspirin Not Reported This Time     Because of hx ulcers per patient   ??? Opana [Oxymorphone] Rash and Itching   ??? Tylenol [Acetaminophen] Itching       Physical Exam     Visit Vitals   ??? BP 129/78 (BP Patient Position: Sitting)   ??? Pulse 88   ??? Temp 97.9 ??F (36.6 ??C)   ??? Resp 18   ??? Ht 5\' 7"  (1.702 m)   ??? Wt 60.8 kg (134 lb)   ??? SpO2 99%   ??? BMI 20.99 kg/m2     Constitutional: Patient appears well developed and well nourished. Marland Kitchen. Appearance and behavior are age and situation appropriate.  Musculoskeletal: Edema noted proximal right thumb with pain with flexion of the thumb and some degenerative changes noted, no erythema or increased warmth  Integumentary: warm and dry without rash  Neurologic: Alert and oriented, sensation intact digits to light touch  Impression and Management Plan   This is  patient who is having pain of the thumb possibly from some overuse, she has history of osteoarthritis but no gout. She also has a history of fibromyalgia with chronic pain although did not take her pain pill prior to coming in today. No need for any imaging studies there was no trauma.    ED Course   Patient given ibuprofen by mouth in the ED and placed in a splint  Medical Decision Making   This is a patient who has some opiates at home for her chronic pain with some inflammatory changes noted the base of the thumb we will treat with prednisone, she states she has not on any diabetic medications and she's had hypoglycemia in the past, old records reviewed and no renal failure.  Final Diagnosis       ICD-10-CM ICD-9-CM   1. Tendonitis M77.9 726.90   2. Arthralgia of right hand M25.541 719.44       Disposition    Patient given a prescription for prednisone and she may take her usual pain medication and she was given a referral to hand surgeon for follow-up    Dorise Hissiana A Dejia Ebron, PA  November 30, 2015    The patient was personally evaluated by myself and Thana AtesMcCormack who agrees with the above assessment and plan      My signature above authenticates this document and my orders, the final ??  diagnosis (es), discharge prescription (s), and instructions in the Epic ??  record.  If you have any questions please contact [phone].  ??  Nursing notes have been reviewed by the physician/ advanced practice ??  Clinician.

## 2015-12-26 ENCOUNTER — Observation Stay: Payer: MEDICAID | Primary: Internal Medicine

## 2015-12-26 ENCOUNTER — Emergency Department: Admit: 2015-12-26 | Payer: MEDICAID | Primary: Internal Medicine

## 2015-12-26 ENCOUNTER — Inpatient Hospital Stay: Admit: 2015-12-26 | Payer: MEDICAID | Primary: Internal Medicine

## 2015-12-26 ENCOUNTER — Inpatient Hospital Stay
Admit: 2015-12-26 | Discharge: 2015-12-29 | Disposition: A | Discharge status: Home / Self Care | Payer: MEDICAID | Attending: Internal Medicine | Admitting: Internal Medicine

## 2015-12-26 DIAGNOSIS — M940 Chondrocostal junction syndrome [Tietze]: Secondary | ICD-10-CM

## 2015-12-26 LAB — CBC WITH AUTOMATED DIFF
BASOPHILS: 0.6 % (ref 0–3)
BASOPHILS: 0.4 % (ref 0–3)
EOSINOPHILS: 2.7 % (ref 0–5)
EOSINOPHILS: 2.6 % (ref 0–5)
HCT: 24 % — ABNORMAL LOW (ref 37.0–50.0)
HCT: 28.6 % — ABNORMAL LOW (ref 37.0–50.0)
HGB: 7.2 gm/dl — ABNORMAL LOW (ref 13.0–17.2)
HGB: 8.8 gm/dl — ABNORMAL LOW (ref 13.0–17.2)
IMMATURE GRANULOCYTES: 0.2 % (ref 0.0–3.0)
IMMATURE GRANULOCYTES: 0.3 % (ref 0.0–3.0)
LYMPHOCYTES: 36.9 % (ref 28–48)
LYMPHOCYTES: 26.3 % — ABNORMAL LOW (ref 28–48)
MCH: 23.5 pg — ABNORMAL LOW (ref 25.4–34.6)
MCH: 24.4 pg — ABNORMAL LOW (ref 25.4–34.6)
MCHC: 30 gm/dl (ref 30.0–36.0)
MCHC: 30.8 gm/dl (ref 30.0–36.0)
MCV: 78.2 fL — ABNORMAL LOW (ref 80.0–98.0)
MCV: 79.2 fL — ABNORMAL LOW (ref 80.0–98.0)
MONOCYTES: 6.5 % (ref 1–13)
MONOCYTES: 6.6 % (ref 1–13)
MPV: 9.8 fL (ref 6.0–10.0)
MPV: 10.1 fL — ABNORMAL HIGH (ref 6.0–10.0)
NEUTROPHILS: 53.1 % (ref 34–64)
NEUTROPHILS: 63.8 % (ref 34–64)
NRBC: 0 (ref 0–0)
NRBC: 0 (ref 0–0)
PLATELET: 328 10*3/uL (ref 140–450)
PLATELET: 335 10*3/uL (ref 140–450)
RBC: 3.07 M/uL — ABNORMAL LOW (ref 3.60–5.20)
RBC: 3.61 M/uL (ref 3.60–5.20)
RDW-SD: 58.2 — ABNORMAL HIGH (ref 36.4–46.3)
RDW-SD: 56.2 — ABNORMAL HIGH (ref 36.4–46.3)
WBC: 5.3 10*3/uL (ref 4.0–11.0)
WBC: 7 10*3/uL (ref 4.0–11.0)

## 2015-12-26 LAB — BLOOD TYPE, (ABO+RH)
ABO/Rh(D): A POS
ABO/Rh: A POS

## 2015-12-26 LAB — METABOLIC PANEL, BASIC
BUN: 8 mg/dl (ref 7–25)
CO2: 27 mEq/L (ref 21–32)
Calcium: 8.2 mg/dl — ABNORMAL LOW (ref 8.5–10.1)
Chloride: 109 mEq/L — ABNORMAL HIGH (ref 98–107)
Creatinine: 0.7 mg/dl (ref 0.6–1.3)
GFR est AA: >60
GFR est non-AA: >60
Glucose: 142 mg/dl — ABNORMAL HIGH (ref 74–106)
Potassium: 4.2 mEq/L (ref 3.5–5.1)
Sodium: 144 mEq/L (ref 136–145)

## 2015-12-26 LAB — GLUCOSE, POC
Glucose (POC): 148 mg/dL — ABNORMAL HIGH (ref 65–105)
Glucose (POC): 88 mg/dL (ref 65–105)
Glucose (POC): 93 mg/dL (ref 65–105)
Glucose (POC): 89 mg/dL (ref 65–105)

## 2015-12-26 LAB — POC CHEM8
BUN: 9 mg/dl (ref 7–25)
CALCIUM,IONIZED: 4.6 mg/dL (ref 4.40–5.40)
CO2, TOTAL: 26 mmol/L (ref 21–32)
Chloride: 103 mEq/L (ref 98–107)
Creatinine: 0.6 mg/dl (ref 0.6–1.3)
Glucose: 107 mg/dL — ABNORMAL HIGH (ref 74–106)
HCT: 26 % — ABNORMAL LOW (ref 38–45)
HGB: 8.8 gm/dl — ABNORMAL LOW (ref 12.4–17.2)
Potassium: 3.8 mEq/L (ref 3.5–4.9)
Sodium: 142 mEq/L (ref 136–145)

## 2015-12-26 LAB — URINALYSIS W/ RFLX MICROSCOPIC
Bilirubin: NEGATIVE
Blood: NEGATIVE
Glucose: NEGATIVE mg/dl
Ketone: NEGATIVE mg/dl
Leukocyte Esterase: NEGATIVE
Nitrites: NEGATIVE
Protein: NEGATIVE mg/dl
Specific gravity: <=1.005 (ref 1.005–1.030)
Urobilinogen: 0.2 mg/dl (ref 0.0–1.0)
pH (UA): 7 (ref 5.0–9.0)

## 2015-12-26 LAB — TYPE + CROSSMATCH
Blood type code: 6200
Issue date/time: 201704260432
Specimen expiration date: 201704292359
Status info.: TRANSFUSED

## 2015-12-26 LAB — TROPONIN I
Troponin-I: <0.015 ng/ml (ref 0.000–0.045)
Troponin-I: <0.015 ng/ml (ref 0.000–0.045)

## 2015-12-26 LAB — POC FECAL OCCULT BLOOD: Occult blood, stool: NEGATIVE — AB

## 2015-12-26 LAB — EKG, 12 LEAD, INITIAL
Atrial Rate: 85 {beats}/min
Calculated P Axis: 74 degrees
Calculated R Axis: 42 degrees
Calculated T Axis: 54 degrees
Diagnosis: NORMAL
P-R Interval: 150 ms
Q-T Interval: 342 ms
QRS Duration: 90 ms
QTC Calculation (Bezet): 406 ms
Ventricular Rate: 85 {beats}/min

## 2015-12-26 LAB — NT-PRO BNP: NT pro-BNP: 170.6 pg/ml — ABNORMAL HIGH (ref 0.0–125.0)

## 2015-12-26 LAB — PROTHROMBIN TIME + INR
INR: 1.9 — ABNORMAL HIGH (ref 0.1–1.1)
INR: 2.1 — ABNORMAL HIGH (ref 0.1–1.1)
Prothrombin time: 21.1 seconds — ABNORMAL HIGH (ref 11.5–14.0)
Prothrombin time: 23.1 seconds — ABNORMAL HIGH (ref 11.5–14.0)

## 2015-12-26 LAB — MAGNESIUM: Magnesium: 2 mg/dl (ref 1.6–2.6)

## 2015-12-26 LAB — IRON PROFILE
Iron % saturation: 7 % — ABNORMAL LOW (ref 20–45)
Iron: 31 ug/dL — ABNORMAL LOW (ref 50–170)
TIBC: 440 ug/dL (ref 250–450)

## 2015-12-26 LAB — T4, FREE: Free T4: 0.83 ng/dl (ref 0.76–1.46)

## 2015-12-26 LAB — HEMOGLOBIN A1C W/O EAG: Hemoglobin A1c: 5.7 % (ref 4.8–6.0)

## 2015-12-26 LAB — POC TROPONIN: Troponin-I: 0.05 ng/ml (ref 0.00–0.07)

## 2015-12-26 LAB — ANTIBODY SCREEN: Antibody screen: NEGATIVE

## 2015-12-26 MED ORDER — PANTOPRAZOLE 40 MG IV SOLR
40 mg | Freq: Two times a day (BID) | INTRAVENOUS | Status: DC
Start: 2015-12-26 — End: 2015-12-28
  Administered 2015-12-27 – 2015-12-28 (×4): via INTRAVENOUS

## 2015-12-26 MED ORDER — LAMOTRIGINE 100 MG TAB
100 mg | Freq: Two times a day (BID) | ORAL | Status: DC
Start: 2015-12-26 — End: 2015-12-29
  Administered 2015-12-26 – 2015-12-29 (×8): via ORAL

## 2015-12-26 MED ORDER — MORPHINE 4 MG/ML SYRINGE
4 mg/mL | INTRAMUSCULAR | Status: AC
Start: 2015-12-26 — End: 2015-12-26
  Administered 2015-12-26: 07:00:00 via INTRAVENOUS

## 2015-12-26 MED ORDER — SODIUM CHLORIDE 0.9 % IV
INTRAVENOUS | Status: DC
Start: 2015-12-26 — End: 2015-12-29
  Administered 2015-12-26 – 2015-12-28 (×3): via INTRAVENOUS

## 2015-12-26 MED ORDER — MORPHINE 4 MG/ML SYRINGE
4 mg/mL | INTRAMUSCULAR | Status: DC | PRN
Start: 2015-12-26 — End: 2015-12-26
  Administered 2015-12-26: 13:00:00 via INTRAVENOUS

## 2015-12-26 MED ORDER — WARFARIN 7.5 MG TAB
7.5 mg | Freq: Once | ORAL | Status: DC
Start: 2015-12-26 — End: 2015-12-26

## 2015-12-26 MED ORDER — ONDANSETRON (PF) 4 MG/2 ML INJECTION
4 mg/2 mL | Freq: Once | INTRAMUSCULAR | Status: AC
Start: 2015-12-26 — End: 2015-12-26
  Administered 2015-12-26: 08:00:00 via INTRAVENOUS

## 2015-12-26 MED ORDER — IOPAMIDOL 76 % IV SOLN
370 mg iodine /mL (76 %) | Freq: Once | INTRAVENOUS | Status: AC
Start: 2015-12-26 — End: 2015-12-26
  Administered 2015-12-26: 19:00:00 via INTRAVENOUS

## 2015-12-26 MED ORDER — WARFARIN 2 MG TAB
2 mg | Freq: Every evening | ORAL | Status: DC
Start: 2015-12-26 — End: 2015-12-26

## 2015-12-26 MED ORDER — SIMVASTATIN 40 MG TAB
40 mg | Freq: Every evening | ORAL | Status: DC
Start: 2015-12-26 — End: 2015-12-29
  Administered 2015-12-27 – 2015-12-29 (×3): via ORAL

## 2015-12-26 MED ORDER — ALBUTEROL SULFATE 2.5 MG/0.5 ML NEB SOLUTION
2.5 mg/0.5 mL | RESPIRATORY_TRACT | Status: DC | PRN
Start: 2015-12-26 — End: 2015-12-29

## 2015-12-26 MED ORDER — PHARMACY WARFARIN NOTE
Status: DC
Start: 2015-12-26 — End: 2015-12-29

## 2015-12-26 MED ORDER — PHARMACY VANCOMYCIN NOTE
Status: DC
Start: 2015-12-26 — End: 2015-12-27

## 2015-12-26 MED ORDER — WARFARIN 7.5 MG TAB
7.5 mg | Freq: Once | ORAL | Status: AC
Start: 2015-12-26 — End: 2015-12-26
  Administered 2015-12-27: 03:00:00 via ORAL

## 2015-12-26 MED ORDER — PANTOPRAZOLE 40 MG IV SOLR
40 mg | Freq: Every day | INTRAVENOUS | Status: DC
Start: 2015-12-26 — End: 2015-12-26
  Administered 2015-12-26: 13:00:00 via INTRAVENOUS

## 2015-12-26 MED ORDER — VANCOMYCIN IN 0.9 % SODIUM CHLORIDE 1 GRAM/250 ML IV
1 gram/250 mL | Freq: Two times a day (BID) | INTRAVENOUS | Status: DC
Start: 2015-12-26 — End: 2015-12-26

## 2015-12-26 MED ORDER — SODIUM CHLORIDE 0.9 % IJ SYRG
Freq: Three times a day (TID) | INTRAMUSCULAR | Status: DC
Start: 2015-12-26 — End: 2015-12-29
  Administered 2015-12-26 – 2015-12-29 (×11): via INTRAVENOUS

## 2015-12-26 MED ORDER — SODIUM CHLORIDE 0.9 % IJ SYRG
INTRAMUSCULAR | Status: DC | PRN
Start: 2015-12-26 — End: 2015-12-29
  Administered 2015-12-27: 15:00:00 via INTRAVENOUS

## 2015-12-26 MED ORDER — PHARMACY VANCOMYCIN NOTE
Freq: Once | Status: DC
Start: 2015-12-26 — End: 2015-12-27

## 2015-12-26 MED ORDER — IPRATROPIUM-ALBUTEROL 2.5 MG-0.5 MG/3 ML NEB SOLUTION
2.5 mg-0.5 mg/3 ml | Freq: Four times a day (QID) | RESPIRATORY_TRACT | Status: DC | PRN
Start: 2015-12-26 — End: 2015-12-29

## 2015-12-26 MED ORDER — ONDANSETRON (PF) 4 MG/2 ML INJECTION
4 mg/2 mL | INTRAMUSCULAR | Status: DC | PRN
Start: 2015-12-26 — End: 2015-12-29
  Administered 2015-12-26 – 2015-12-28 (×7): via INTRAVENOUS

## 2015-12-26 MED ORDER — HYDROMORPHONE (PF) 1 MG/ML IJ SOLN
1 mg/mL | INTRAMUSCULAR | Status: DC | PRN
Start: 2015-12-26 — End: 2015-12-29
  Administered 2015-12-26 – 2015-12-29 (×15): via INTRAVENOUS

## 2015-12-26 MED ORDER — VANCOMYCIN IN 0.9 % SODIUM CHLORIDE 1.25 GRAM/250 ML IV
1.25 gram/250 mL | Freq: Once | INTRAVENOUS | Status: DC
Start: 2015-12-26 — End: 2015-12-29
  Administered 2015-12-26: 20:00:00 via INTRAVENOUS

## 2015-12-26 MED ORDER — SODIUM CHLORIDE 0.9 % IV
INTRAVENOUS | Status: DC | PRN
Start: 2015-12-26 — End: 2015-12-29

## 2015-12-26 MED ORDER — CITALOPRAM 20 MG TAB
20 mg | Freq: Every day | ORAL | Status: DC
Start: 2015-12-26 — End: 2015-12-29
  Administered 2015-12-26 – 2015-12-29 (×5): via ORAL

## 2015-12-26 MED ORDER — NALOXONE 0.4 MG/ML INJECTION
0.4 mg/mL | INTRAMUSCULAR | Status: DC | PRN
Start: 2015-12-26 — End: 2015-12-29

## 2015-12-26 MED ORDER — VANCOMYCIN IN 0.9 % SODIUM CHLORIDE 1.25 GRAM/250 ML IV
1.25 gram/250 mL | Freq: Once | INTRAVENOUS | Status: DC
Start: 2015-12-26 — End: 2015-12-26

## 2015-12-26 MED ORDER — PIPERACILLIN-TAZOBACTAM 3.375 GRAM IV SOLR
3.375 gram | Freq: Three times a day (TID) | INTRAVENOUS | Status: DC
Start: 2015-12-26 — End: 2015-12-26
  Administered 2015-12-26: 18:00:00 via INTRAVENOUS

## 2015-12-26 MED ORDER — OXYCODONE 5 MG TAB
5 mg | ORAL | Status: DC | PRN
Start: 2015-12-26 — End: 2015-12-29

## 2015-12-26 MED FILL — MORPHINE 4 MG/ML SYRINGE: 4 mg/mL | INTRAMUSCULAR | Qty: 1

## 2015-12-26 MED FILL — COUMADIN 7.5 MG TABLET: 7.5 mg | ORAL | Qty: 1

## 2015-12-26 MED FILL — VANCOMYCIN IN 0.9 % SODIUM CHLORIDE 1 GRAM/250 ML IV: 1 gram/250 mL | INTRAVENOUS | Qty: 250

## 2015-12-26 MED FILL — VANCOMYCIN IN 0.9 % SODIUM CHLORIDE 1.25 GRAM/250 ML IV: 1.25 gram/250 mL | INTRAVENOUS | Qty: 250

## 2015-12-26 MED FILL — ISOVUE-370  76 % INTRAVENOUS SOLUTION: 370 mg iodine /mL (76 %) | INTRAVENOUS | Qty: 85

## 2015-12-26 MED FILL — SODIUM CHLORIDE 0.9 % IV: INTRAVENOUS | Qty: 250

## 2015-12-26 MED FILL — PHARMACY VANCOMYCIN NOTE: Qty: 1

## 2015-12-26 MED FILL — ONDANSETRON (PF) 4 MG/2 ML INJECTION: 4 mg/2 mL | INTRAMUSCULAR | Qty: 2

## 2015-12-26 MED FILL — CITALOPRAM 20 MG TAB: 20 mg | ORAL | Qty: 1

## 2015-12-26 MED FILL — SODIUM CHLORIDE 0.9 % INJECTION: INTRAMUSCULAR | Qty: 10

## 2015-12-26 MED FILL — PHARMACY WARFARIN NOTE: Qty: 1

## 2015-12-26 MED FILL — HYDROMORPHONE (PF) 1 MG/ML IJ SOLN: 1 mg/mL | INTRAMUSCULAR | Qty: 1

## 2015-12-26 MED FILL — SODIUM CHLORIDE 0.9 % IV: INTRAVENOUS | Qty: 1000

## 2015-12-26 MED FILL — PIPERACILLIN-TAZOBACTAM 3.375 GRAM IV SOLR: 3.375 gram | INTRAVENOUS | Qty: 3.38

## 2015-12-26 MED FILL — LAMOTRIGINE 100 MG TAB: 100 mg | ORAL | Qty: 1

## 2015-12-26 NOTE — ED Notes (Signed)
Blood transfusion consent signed and placed in the chart.

## 2015-12-26 NOTE — Other (Signed)
Bedside and Verbal shift change report given to Randy RN (oncoming nurse) by Rachel RN (offgoing nurse). Report included the following information SBAR, Kardex, Procedure Summary, Intake/Output, MAR, Recent Results and Cardiac Rhythm NSR.

## 2015-12-26 NOTE — Progress Notes (Signed)
Medicine Progress Note    Patient: [name] [name]   Age:  65 y.o.  DOA: 12/26/2015   Admit Dx / CC: symptomatic anemia, chest pain  LOS:  LOS: 0 days     Assessment/Plan   Active Problems:    Chest pain (05/19/2015)      Additional Plan notes   1. Atypical Chest Pain r/o ACS. Trops q8hr,morphine/dilaudid iv prn  2. ? Free air on CXR with epigastric pain. ruled out by repeat KUB and CT abd/pel; increased PPI iv bid and get a GI eval for any EGD needs given her severe sharp epigastric pain.  3. CAD s/p stent placement. Cont coumadin  4. Anemia, symptomatic r/o acute blood loss anemia. Received 2u PRBC's,monitor q12hr,check iron/b12/folate levels  5. H/o gastritis/esophagitis and PUD S/P partial gastrectomy 10-15 years ago in [place], [place] for recurrent PUD.??Will get GI eval with Dr.Ricketts for a possible EGD need. Checking hemoccult stool.  6. Hx PE/DVT on coumadin  INR 2.1 in tx range,cont pharmacy dosing  7. Dyspnea. CXR OK  8. Diarrhea. Check c.diff,stopped empiric zosyn/vanco iv  9. Generalized fatigue. Pt/ot daily  10 .GI and DVT Prof. PPI iv bid, coumadin    DISPO - pt unsafe for transfer or discharge at this time for reasons addressed above, continued hospitalization for ongoing assessment and treatment indicated      Anticipated Date of Discharge: 12/30/15  Anticipated Disposition (home, SNF) : home    Subjective:   Patient seen and examined. Pt had severe sharp stabbing CP in the epigastric/diaphargmatic area with a CXR that suggested Free air; a stat Surgery eval with Dr.Rahman was done and a subsequent KUB showed no free air. Pt did undergo a CT abd/pel which verified this and fortunately she will not need any emergent intervention at this time. Empiric iv zosyn/vanco abx will be d/c'd and she can cont iv dilaudid as iv morphine earlier was ineffective. She denies any N/V, F/C, but has some reproductive CP w/ mild SOB. She remains afebrile.    Objective:     Visit Vitals    ??? BP 136/73 (BP 1 Location: Left arm, BP Patient Position: Supine)   ??? Pulse 82   ??? Temp 97.8 ??F (36.6 ??C)   ??? Resp 16   ??? Ht  (1.702 m)   ??? Wt 67.1 kg (147 lb 14.9 oz)   ??? SpO2 99%   ??? Breastfeeding No   ??? BMI 23.17 kg/m2     Physical Exam:  General appearance: alert, cooperative, no distress, appears stated age  Head: Normocephalic, without obvious abnormality, atraumatic  Neck: supple, trachea midline  Lungs: clear to auscultation bilaterally,no wheezes,rales,or rhonchi  Heart: regular rate and rhythm, S1, S2 normal, no murmur, click, rub or gallop, reproducible CP on substernal palpation  Abdomen: soft, epigastric tender. Bowel sounds normal. No masses,  no organomegaly  Extremities: extremities normal, atraumatic, no cyanosis or edema  Skin: Skin color, texture, turgor normal. No rashes or lesions  Neurologic: Grossly normal    Intake and Output:  Current Shift:  04/26 0701 - 04/26 1900  In: 195   Out: -   Last three shifts:       Lab/Data Reviewed:  Recent Results (from the past 24 hour(s))   POC TROPONIN-I    Collection Time: 12/26/15  1:14 AM   Result Value Ref Range    Troponin-I 0.05 0.00 - 0.07 ng/ml   CBC WITH AUTOMATED DIFF    Collection Time: 12/26/15  1:43 AM   Result Value Ref Range    WBC 5.3 4.0 - 11.0 1000/mm3    RBC 3.07 (L) 3.60 - 5.20 M/uL    HGB 7.2 (L) 13.0 - 17.2 gm/dl    HCT 16.1 (L) 09.6 - 50.0 %    MCV 78.2 (L) 80.0 - 98.0 fL    MCH 23.5 (L) 25.4 - 34.6 pg    MCHC 30.0 30.0 - 36.0 gm/dl    PLATELET 045 409 - 811 1000/mm3    MPV 9.8 6.0 - 10.0 fL    RDW-SD 58.2 (H) 36.4 - 46.3      NRBC 0 0 - 0      IMMATURE GRANULOCYTES 0.2 0.0 - 3.0 %    NEUTROPHILS 53.1 34 - 64 %    LYMPHOCYTES 36.9 28 - 48 %    MONOCYTES 6.5 1 - 13 %    EOSINOPHILS 2.7 0 - 5 %    BASOPHILS 0.6 0 - 3 %   POC CHEM8    Collection Time: 12/26/15  1:44 AM   Result Value Ref Range    Sodium 142 136 - 145 mEq/L    Potassium 3.8 3.5 - 4.9 mEq/L    Chloride 103 98 - 107 mEq/L    CO2, TOTAL 26 21 - 32 mmol/L     Glucose 107 (H) 74 - 106 mg/dL    BUN 9 7 - 25 mg/dl    Creatinine 0.6 0.6 - 1.3 mg/dl    HCT 26 (L) 38 - 45 %    HGB 8.8 (L) 12.4 - 17.2 gm/dl    CALCIUM,IONIZED 9.14 4.40 - 5.40 mg/dL   PRO-BNP    Collection Time: 12/26/15  2:41 AM   Result Value Ref Range    NT pro-BNP 170.6 (H) 0.0 - 125.0 pg/ml   PROTHROMBIN TIME + INR    Collection Time: 12/26/15  2:41 AM   Result Value Ref Range    Prothrombin time 21.1 (H) 11.5 - 14.0 seconds    INR 1.9 (H) 0.1 - 1.1     TYPE, ABO & RH    Collection Time: 12/26/15  3:30 AM   Result Value Ref Range    ABO/Rh(D) A Rh Positive     ANTIBODY SCREEN    Collection Time: 12/26/15  3:30 AM   Result Value Ref Range    Antibody screen NEG     TYPE & CROSSMATCH    Collection Time: 12/26/15  3:30 AM   Result Value Ref Range    Product ID Red Blood Cells      Unit number [id number]      Crossmatch result Compatible      Status info. Transfused      Product code [id number]      Blood type code 6200      Issue date/time 528413244010      Specimen expiration date [id number]     TYPE & CROSSMATCH    Collection Time: 12/26/15  4:02 AM   Result Value Ref Range    Product ID Red Blood Cells      Unit number [id number]      Crossmatch result Compatible      Status info. Ready      Product code [phone]      Blood type code 6200      Specimen expiration date [id number]     TROPONIN I    Collection Time: 12/26/15  6:26 AM   Result Value Ref Range  Troponin-I <0.015 0.000 - 0.045 ng/ml   HEMOGLOBIN A1C W/O EAG    Collection Time: 12/26/15  6:26 AM   Result Value Ref Range    Hemoglobin A1c 5.7 4.8 - 6.0 %   GLUCOSE, POC    Collection Time: 12/26/15  6:28 AM   Result Value Ref Range    Glucose (POC) 148 (H) 65 - 105 mg/dL   METABOLIC PANEL, BASIC    Collection Time: 12/26/15  6:34 AM   Result Value Ref Range    Sodium 144 136 - 145 mEq/L    Potassium 4.2 3.5 - 5.1 mEq/L    Chloride 109 (H) 98 - 107 mEq/L    CO2 27 21 - 32 mEq/L    Glucose 142 (H) 74 - 106 mg/dl    BUN 8 7 - 25 mg/dl     Creatinine 0.7 0.6 - 1.3 mg/dl    GFR est AA >16.1>60.0      GFR est non-AA >60      Calcium 8.2 (L) 8.5 - 10.1 mg/dl   T4, FREE    Collection Time: 12/26/15  6:34 AM   Result Value Ref Range    Free T4 0.83 0.76 - 1.46 ng/dl   MAGNESIUM    Collection Time: 12/26/15  6:34 AM   Result Value Ref Range    Magnesium 2.0 1.6 - 2.6 mg/dl       Lab Results   Component Value Date/Time    CULT NO GROWTH 6 DAYS 04/28/2011 09:13 PM    CULT NO GROWTH 6 DAYS 04/28/2011 09:00 PM       Lab Results   Component Value Date/Time    Iron 48 11/28/2014 05:21 AM    TIBC 432 11/28/2014 05:21 AM    TIBC 432 11/28/2014 05:21 AM    Iron % saturation 11 11/28/2014 05:21 AM    Ferritin 14.0 01/01/2015 11:45 PM       Coagulation:   Recent Labs      12/26/15   0241   PTP  21.1*   INR  1.9*     D-Dimer: No results for input(s): HDDIME in the last 72 hours.  Cardiac markers: No results for input(s): CPK, CKND1, MYO in the last 72 hours.    No lab exists for component: CKRMB, TROIP  ABGs: No results for input(s): PH, PO2, PCO2, HCO3 in the last 72 hours.  Amylase/Lipase: No results for input(s): AML, LPSE in the last 72 hours.    Lab Results   Component Value Date/Time    Hemoglobin A1c 5.7 12/26/2015 06:26 AM       Imaging:  2decho 07/18/15  Interpretation Summary  A complete two-dimensional transthoracic echocardiogram was performed (2D, M-  mode, Doppler and color flow Doppler).  The study was technically adequate.  Left ventricular systolic function is normal.  The calculated left ventricular ejection fraction is 53%.  There is mild concentric left ventricular hypertrophy.  The right ventricle is normal in size and function.  The interatrial septum is intact with no evidence for an atrial septal  defect.  Negative bubble study 09/2014  Based on the peak tricuspid regurgitation velocity the estimated right  ventricular systolic pressure is 39 mmHg.  There is trace mitral regurgitation.   Sclerotic edge thickening of the aortic valve with normal cusp mobility.  There is a previous echo from 05/2015 for comparison which shows no  significant changes.    Medications Reviewed:  Current Facility-Administered Medications   Medication Dose  Route Frequency   ??? morphine injection 4 mg  4 mg IntraVENous Q4H PRN   ??? albuterol-ipratropium (DUO-NEB) 2.5 MG-0.5 MG/3 ML  3 mL Nebulization Q6H PRN   ??? ondansetron (ZOFRAN) injection 4 mg  4 mg IntraVENous Q4H PRN   ??? lamoTRIgine (LaMICtal) tablet 100 mg  100 mg Oral BID   ??? simvastatin (ZOCOR) tablet 80 mg  80 mg Oral QHS   ??? citalopram (CELEXA) tablet 20 mg  20 mg Oral DAILY   ??? sodium chloride (NS) flush 5-10 mL  5-10 mL IntraVENous Q8H   ??? sodium chloride (NS) flush 5-10 mL  5-10 mL IntraVENous PRN   ??? naloxone (NARCAN) injection 0.1 mg  0.1 mg IntraVENous PRN   ??? oxyCODONE IR (ROXICODONE) tablet 5 mg  5 mg Oral Q4H PRN   ??? albuterol CONCENTRATE 2.5mg /0.5 mL neb soln  2.5 mg Nebulization Q4H PRN   ??? pantoprazole (PROTONIX) 40 mg in sodium chloride 0.9 % 10 mL injection  40 mg IntraVENous DAILY   ??? warfarin (COUMADIN) split tablet 10 mg  10 mg Oral QPM   ??? *Warfarin Pharmacy to Dose  1 Each Other Rx Dosing/Monitoring       Elenor Quinones, MD  P# [phone]  December 26, 2015    Time spent 32 minutes

## 2015-12-26 NOTE — H&P (Addendum)
History and Physical      DOS:  12/26/15    Patient: [name] [name]               Sex: female          DOA: 12/26/2015         Date of Birth:  [birth year]      Age:  65 y.o.        LOS:  LOS: 0 days        CC:  Chest Pain; SOB    HPI:     [name] [name] is a 64 y.o. female with hx of HTN, HLD, CAD s/p stents, and DM with c/o   CP, intermittent L sided and SOB.  Pos DOE.  Pos fatigue.  Pos abdominal pain.  Nausea and   vomiting at home.  Pos diarrhea.  No blood in stool or in emesis.  No fever or chills.  Denies   congestion or cough.  Pt has been compliant with meds.        Past Medical History:   Diagnosis Date   ??? Acute hypercapnic respiratory failure (HCC) 02/05/2013    W/ Encephalopathy requiring BiPAP, Etiology Uncertain however Possible Narcotic Benzodiazipine Related?    ??? Adjustment disorder with mixed anxiety and depressed mood    ??? Aneurysm of internal carotid artery    ??? Arthritis of knee    ??? Asthma    ??? Autoimmune disease (HCC)    ??? CAD (coronary artery disease)    ??? Cervicalgia    ??? Chest pain 03/17/2014    Dobutamine Stress Echo: NORMAL WALL MOTION AND GLOBAL SYSTOLIC FUNCTION AT REST AND AFTER?? STRESS/EXERCISE WITH APPROPRIATE AUGMENTATION OF FUNCTION IN ALL SEGMENTS. NO EVIDENCE OF INDUCIBLE ISCHEMIA AT ADEQUATE HEART RATE WITH DOBUTAMINE STRESS.??            ??? Chronic low back pain 10/22/2014    [facility] MRI Lumbar w/o contrast: Moderate facet arthropathy at L4-L5 and L5-S1. Mild/moderate foraminal stenosis at these levels. Disc bulge with annular tear at L4-L5.    ??? Chronic pain syndrome    ??? Coccydynia    ??? Diabetes mellitus     NIDDM   ??? DVT (deep venous thrombosis) (HCC) 05/19/2007    [facility] CTA: 1. Small nonocclusive pulmonary embolus in a subsegmental branch to the left lower lobe. Larger filling defect in a segmental branch to the right lower lobe, not as low in attenuation as is normally seen with a pulmonary embolus, however is seen in multiple planes and remains  concerning for an additional nonocclusive pulmonary embolus.   ??? Fibromyalgia    ??? Gastric ulcer    ??? Gastritis 01/03/2015    [facility] Admission, EGD + H. Pylori/Gastritis   ??? Generalized osteoarthritis of multiple sites    ??? GI bleed 01/01/2015    [facility] Admit 5/2-01/07/2015   ??? H. pylori infection 01/03/2015    [facility] Admission, EGD + H. Pylori/Gastritis   ??? Hemiparesis, right (HCC)    ??? Hyperlipidemia    ??? Hypertension    ??? Irregular sleep-wake rhythm    ??? Knee joint pain    ??? Lumbar spondylosis     Orthopedic Spine Surgeon: Dr. Ree Kida L. Siegel    ??? Lupus (HCC)    ??? Muscle spasm    ??? Normocytic anemia     Chronic Iron Deficiency Anemia   ??? Osteoporosis    ??? Pancreatitis    ??? Peripheral neuropathy (HCC)    ???  Pulmonary embolism (HCC)     Multiple   ??? Seizure (HCC)    ??? Seizures (HCC)    ??? Sensory ataxia    ??? SLE (systemic lupus erythematosus) (HCC)    ??? Stroke [facility](HCC)    ??? Subclinical hypothyroidism    ??? Vitamin D deficiency        Past Surgical History:   Procedure Laterality Date   ??? CARDIAC SURG PROCEDURE UNLIST      Cardiac Cath PCI w/ Stents   ??? HX CHOLECYSTECTOMY     ??? HX GI      Partial Gastrectomy    ??? HX GI  01/03/2015    [facility] EGD by Dr. Smitty CordsBruce D. Waldholtz: S/P B-2 Gastric Resection, Gastritis in small remnant, Bx of Jejunum r/o Celiac Disease and of Stomach. + Gastritis + H. Pylori   ??? HX HYSTERECTOMY         Family History   Problem Relation Age of Onset   ??? Diabetes Maternal Grandmother        Social History     Social History   ??? Marital status: DIVORCED     Spouse name: N/A   ??? Number of children: N/A   ??? Years of education: N/A     Social History Main Topics   ??? Smoking status: Former Smoker   ??? Smokeless tobacco: Never Used   ??? Alcohol use No   ??? Drug use: No   ??? Sexual activity: Not Asked     Other Topics Concern   ??? None     Social History Narrative       Prior to Admission medications    Medication Sig Start Date End Date Taking? Authorizing Provider    predniSONE (DELTASONE) 20 mg tablet 2 po every day for 3 days then 1 po every day for 2 days with food 11/30/15  Yes Dorise Hissiana A Houle, PA   albuterol (PROVENTIL HFA, VENTOLIN HFA, PROAIR HFA) 90 mcg/actuation inhaler Take 2 Puffs by inhalation every four (4) hours as needed for Wheezing. 09/25/15  Yes Olin PiaMelanie M Vedder, NP   albuterol (PROVENTIL HFA) 90 mcg/actuation inhaler Take 1 Puff by inhalation as needed. 09/25/15  Yes Olin PiaMelanie M Vedder, NP   oxyCODONE IR (OXY-IR) 15 mg immediate release tablet Take 1 Tab by mouth four (4) times daily. Max Daily Amount: 60 mg. 07/20/15  Yes Faythe GheeLisa Huang, DO   warfarin (COUMADIN) 5 mg tablet Take 2 tablets (10mg ) daily on 10/15, 10/16 & have INR checked 10/17 06/16/15  Yes Dorothe PeaBrian F O'Donnell, MD   citalopram (CELEXA) 20 mg tablet Take 20 mg by mouth daily.   Yes Phys Other, MD   cyclobenzaprine (FLEXERIL) 5 mg tablet Take 10 mg by mouth three (3) times daily as needed for Muscle Spasm(s).   Yes Phys Other, MD   simvastatin (ZOCOR) 80 mg tablet Take 80 mg by mouth nightly.   Yes Phys Other, MD   FLUoxetine (PROZAC) 40 mg capsule Take 1 Cap by mouth daily. 01/07/15  Yes Theodis AguasYared G Terefe, MD   lamoTRIgine (LAMICTAL) 100 mg tablet Take 1 Tab by mouth two (2) times a day. 01/07/15  Yes Theodis AguasYared G Terefe, MD   ferrous gluconate 324 mg (36 mg iron) tab Take 1 Tab by mouth Before breakfast and dinner. 11/29/14  Yes Kristie Cowmanoy P MacGregor, MD   alendronate-vitamin d3 [phone] mg-unit per tablet Take 1 Tab by mouth every seven (7) days.   Yes Phys Other, MD   amitriptyline (ELAVIL)  75 mg tablet Take 1 Tab by mouth nightly.  Patient taking differently: Take 50 mg by mouth nightly. 03/29/12  Yes Sunshine J McWhinney, PA-C   albuterol-ipratropium (DUO-NEB) 0.5 mg-3 mg(2.5 mg base)/3 mL nebulizer solution 3 mL by Nebulization route once.   Yes Historical Provider   Cholecalciferol, Vitamin D3, (VITAMIN D) 1,000 unit Cap Take 50,000 Units by mouth every seven (7) days.   Yes Historical Provider       Allergies    Allergen Reactions   ??? Aspirin Not Reported This Time     Because of hx ulcers per patient   ??? Opana [Oxymorphone] Rash and Itching   ??? Tylenol [Acetaminophen] Itching       Review of Systems    1)  See above.  2) a 12 point review of systems has been obtained.  ROS is o/w non-contributory        Physical Exam:      Visit Vitals   ??? BP 141/79   ??? Pulse 79   ??? Temp 98.7 ??F (37.1 ??C)   ??? Resp 16   ??? Ht  (1.702 m)   ??? Wt 64.9 kg (143 lb)   ??? SpO2 93%   ??? BMI 22.4 kg/m2     No intake or output data in the 24 hours ending 12/26/15 0408      Physical Exam:    HEENT: NC/AT, PERRLA, EOMI, normal mm  NECK: No lymphadenopthy or thyroid swelling, JVD not seen, no bruits,  Supple, NT  LYMPH: No supraclavicular or cervical or axillary nodes on both sides  CVS:  RRR, No murmurs, No gallop or rub, chest wall tender with palpation,   No crepitus   RS: Bilateral Air Entry moderate, No wheezing or crackles  Abd: Soft, non tender, not distended, No guarding, No rigidity, BS normal,   Stool heme neg per ED provider   NEURO:  Non focal, normal strength.  CN II - XII intact bilaterally   Extrm: no leg edema or leg swelling   Skin: No rash    Labs Reviewed:    CMP:   Lab Results   Component Value Date/Time    NA 142 12/26/2015 01:44 AM    K 3.8 12/26/2015 01:44 AM    CL 103 12/26/2015 01:44 AM    CO2 26 12/26/2015 01:44 AM    GLU 107 (H) 12/26/2015 01:44 AM    BUN 9 12/26/2015 01:44 AM    CREA 0.6 12/26/2015 01:44 AM     CBC:   Lab Results   Component Value Date/Time    WBC 5.3 12/26/2015 01:43 AM    HGB 8.8 (L) 12/26/2015 01:44 AM    HCT 26 (L) 12/26/2015 01:44 AM    PLT 328 12/26/2015 01:43 AM     All Cardiac Markers in the last 24 hours:   Lab Results   Component Value Date/Time    TROPT 0.05 12/26/2015 01:14 AM       COAGS:   Lab Results   Component Value Date/Time    PTP 21.1 (H) 12/26/2015 02:41 AM    INR 1.9 (H) 12/26/2015 02:41 AM       ECG:  NSR, rate 85    CXR:    Final result (Exam End: 12/26/2015 ??1:37 AM) Open    ??    Study Result    ?? Clinical history: Chest pain, abdominal pain, nausea and vomiting  ??  EXAMINATION: PA and lateral views of the chest 12/26/2015  ??  Correlation: 09/25/2015  ??  FINDINGS:  Trachea and heart size are within normal limits. Lungs are clear. Lucency  beneath the left diaphragm, may be related to superimposed bowel loop or  pneumoperitoneum. Degenerative changes of the spine. There is hyperinflation.  ??  IMPRESSION  IMPRESSION:  1. COPD.  2. Possible pneumoperitoneum CT scan of the abdomen suggested for further  evaluation.         Assessment/Plan     Active Problems:    Chest pain (05/19/2015)    Chest Pain r/o ACS    CAD s/p stent placement    Anemia, symptomatic r/o acute blood loss anemia    Coagulopathy on coumadin therapy    Hx PE/DVT on coumadin     Dyspnea    Diarrhea    Generalized fatigue      PLAN:  Admit to a monitored bed on telemetry.  O2 as needed and supportive care.  GI and DVT   Prophylaxis.  Replace electrolytes as needed.  Serial cardiac enzymes.  Check 2 D Echo.    Monitor H&H.  Transfuse initially with 2 U PRBCS, then transfuse as needed.  IV Protonix for GI   Prophylaxis.  Gentle hydration with IVF and monitor to avoid fluid overload.  Continue home meds  When possible.  Consider Cardiology consult if CP persists after pt's transfusion has been   Completed.  Check CT abd/pelvis with IV contrast to better assess pt's abdominal complaints and   To r/o a pneumoperitoneum based on CXR findings.  Further recommendations based on test results,   response to treatment, and clinical course.        Otilio Carpen, MD  12/26/2015  4:08 AM

## 2015-12-26 NOTE — Other (Signed)
----------  DocumentID: [id number]------------------------------------------------              [facility]                       Patient Education Report         Name: [name], Merranda                  Date: 12/26/2015    MRN: [id number]                    Time: 5:42:29 AM         Patient ordered video: Patient Safety: Stay Safe While you are in the Hospital    from [phone] via phone number: [phone] at 5:42:29 AM

## 2015-12-26 NOTE — Progress Notes (Signed)
Warfarin: I revised dose for this evening to 7.5 mg. See inr 1.9->2.1 this am, with no dose given.   Inr qam, goal 2-3 for hx of dvt/PE

## 2015-12-26 NOTE — Other (Signed)
TRANSFER - OUT REPORT:    Verbal report given to madeline, RN(name) on [name] [name]  being transferred to 6711(unit) for routine progression of care       Report consisted of patient???s Situation, Background, Assessment and   Recommendations(SBAR).     Information from the following report(s) SBAR was reviewed with the receiving nurse.    Lines:   Peripheral IV 12/26/15 Right Antecubital (Active)        Opportunity for questions and clarification was provided.      Patient transported with:   Registered Nurse

## 2015-12-26 NOTE — Progress Notes (Signed)
Problem: Falls - Risk of  Goal: *Absence of falls  Outcome: Progressing Towards Goal  Patient free from falls during shift. Bed in low position, patient educated, call bell within reach, bathroom light left on at all times. Will continue to monitor patient throughout shift.

## 2015-12-26 NOTE — Progress Notes (Signed)
Bedside and Verbal shift change report given to Randy, RN (oncoming nurse) by Rachel, RN (offgoing nurse). Report included the following information SBAR, Kardex, Procedure Summary, Intake/Output, MAR, Recent Results and Med Rec Status.

## 2015-12-26 NOTE — ED Provider Notes (Signed)
[facility]  Emergency Department Treatment Report    Patient: [name] CourseVeronica E [name] Age: 65 y.o. Sex: female    Date of Birth: 01/21/1951 Admit Date: 12/26/2015 PCP: Lewis MoccasinJasmine A Finch, MD   MRN: [id number]  CSN: [id number]     Room: ER33/ER33         Chief Complaint      Chief Complaint   Patient presents with   ??? Chest Pain       History of Present Illness   65 y.o. female with many PM problems including CAD with stenting and GI bleeding, presents with left sided CP and SOB. SOB on exertion and fatigue. Also complains of nausea and LE edema. Denies fevers, chills. C/o episodes of diarrhea yesterday. Pain 7/10 left chest described as stabbing.     Review of Systems   Constitutional:  Denies malaise, fever, chills.   ENMT:  No congestion, difficulty swallowing, sore throat.   Neck:  No injury or pain.   Chest:  No injury.   Cardiac:  Denies edema, or palpitations.   Respiratory:  Denies cough, wheezing, difficulty breathing, shortness of breath.   GI/ABD:  Denies pain, distention, nausea, vomiting, diarrhea.   GU:  Denies injury, pain, dysuria, urgency, frequency, or discharge.   Back: No injury or pain.   Extremity/MS:  No injury or pain, swelling or loss of motion.   Neuro:  Denies headache, LOC, dizziness, paresthesias, dysphagia, or syncope    Skin: Denies injury, rash, itching, ecchymosis or skin changes.   All other ROS negative.       Past Medical/Surgical History     Past Medical History:   Diagnosis Date   ??? Acute hypercapnic respiratory failure (HCC) 02/05/2013    W/ Encephalopathy requiring BiPAP, Etiology Uncertain however Possible Narcotic Benzodiazipine Related?    ??? Adjustment disorder with mixed anxiety and depressed mood    ??? Aneurysm of internal carotid artery    ??? Arthritis of knee    ??? Asthma    ??? Autoimmune disease (HCC)    ??? CAD (coronary artery disease)    ??? Cervicalgia    ??? Chest pain 03/17/2014    Dobutamine Stress Echo: NORMAL WALL MOTION AND GLOBAL SYSTOLIC FUNCTION  AT REST AND AFTER?? STRESS/EXERCISE WITH APPROPRIATE AUGMENTATION OF FUNCTION IN ALL SEGMENTS. NO EVIDENCE OF INDUCIBLE ISCHEMIA AT ADEQUATE HEART RATE WITH DOBUTAMINE STRESS.??            ??? Chronic low back pain 10/22/2014    [facility]: Moderate facet arthropathy at L4-L5 and L5-S1. Mild/moderate foraminal stenosis at these levels. Disc bulge with annular tear at L4-L5.    ??? Chronic pain syndrome    ??? Coccydynia    ??? Diabetes mellitus     NIDDM   ??? DVT (deep venous thrombosis) (HCC) 05/19/2007    [facility] CTA: 1. Small nonocclusive pulmonary embolus in a subsegmental branch to the left lower lobe. Larger filling defect in a segmental branch to the right lower lobe, not as low in attenuation as is normally seen with a pulmonary embolus, however is seen in multiple planes and remains concerning for an additional nonocclusive pulmonary embolus.   ??? Fibromyalgia    ??? Gastric ulcer    ??? Gastritis 01/03/2015    [facility] Admission, EGD + H. Pylori/Gastritis   ??? Generalized osteoarthritis of multiple sites    ??? GI bleed 01/01/2015    [facility] Admit 5/2-01/07/2015   ??? H. pylori infection 01/03/2015  [facility] Admission, EGD + H. Pylori/Gastritis   ??? Hemiparesis, right (HCC)    ??? Hyperlipidemia    ??? Hypertension    ??? Irregular sleep-wake rhythm    ??? Knee joint pain    ??? Lumbar spondylosis     Orthopedic Spine Surgeon: Dr. Ree Kida L. Siegel    ??? Lupus (HCC)    ??? Muscle spasm    ??? Normocytic anemia     Chronic Iron Deficiency Anemia   ??? Osteoporosis    ??? Pancreatitis    ??? Peripheral neuropathy (HCC)    ??? Pulmonary embolism (HCC)     Multiple   ??? Seizure (HCC)    ??? Seizures (HCC)    ??? Sensory ataxia    ??? SLE (systemic lupus erythematosus) (HCC)    ??? Stroke [facility])    ??? Subclinical hypothyroidism    ??? Vitamin D deficiency      Past Surgical History:   Procedure Laterality Date   ??? CARDIAC SURG PROCEDURE UNLIST      Cardiac Cath PCI w/ Stents   ??? HX CHOLECYSTECTOMY     ??? HX GI      Partial Gastrectomy    ??? HX GI  01/03/2015     [facility] EGD by Dr. Smitty Cords D. Waldholtz: S/P B-2 Gastric Resection, Gastritis in small remnant, Bx of Jejunum r/o Celiac Disease and of Stomach. + Gastritis + H. Pylori   ??? HX HYSTERECTOMY         Social History     Social History     Social History   ??? Marital status: DIVORCED     Spouse name: N/A   ??? Number of children: N/A   ??? Years of education: N/A     Social History Main Topics   ??? Smoking status: Former Smoker   ??? Smokeless tobacco: Never Used   ??? Alcohol use No   ??? Drug use: No   ??? Sexual activity: Not Asked     Other Topics Concern   ??? None     Social History Narrative       Family History     Family History   Problem Relation Age of Onset   ??? Diabetes Maternal Grandmother        Current Medications     Current Outpatient Prescriptions   Medication Sig Dispense Refill   ??? predniSONE (DELTASONE) 20 mg tablet 2 po every day for 3 days then 1 po every day for 2 days with food 8 Tab 0   ??? albuterol (PROVENTIL HFA, VENTOLIN HFA, PROAIR HFA) 90 mcg/actuation inhaler Take 2 Puffs by inhalation every four (4) hours as needed for Wheezing. 1 Inhaler 0   ??? albuterol (PROVENTIL HFA) 90 mcg/actuation inhaler Take 1 Puff by inhalation as needed. 1 Inhaler 0   ??? oxyCODONE IR (OXY-IR) 15 mg immediate release tablet Take 1 Tab by mouth four (4) times daily. Max Daily Amount: 60 mg. 10 Tab 0   ??? warfarin (COUMADIN) 5 mg tablet Take 2 tablets ( ) daily on 10/15, 10/16 & have INR checked 10/17 1 Tab 0   ??? citalopram (CELEXA) 20 mg tablet Take 20 mg by mouth daily.     ??? cyclobenzaprine (FLEXERIL) 5 mg tablet Take 10 mg by mouth three (3) times daily as needed for Muscle Spasm(s).     ??? simvastatin (ZOCOR) 80 mg tablet Take 80 mg by mouth nightly.     ??? FLUoxetine (PROZAC) 40 mg capsule Take 1 Cap by  mouth daily. 30 Cap 0   ??? lamoTRIgine (LAMICTAL) 100 mg tablet Take 1 Tab by mouth two (2) times a day. 60 Tab 0   ??? ferrous gluconate 324 mg (36 mg iron) tab Take 1 Tab by mouth Before breakfast and dinner. 60 Tab 2    ??? alendronate-vitamin d3 70-2,800 mg-unit per tablet Take 1 Tab by mouth every seven (7) days.     ??? amitriptyline (ELAVIL) 75 mg tablet Take 1 Tab by mouth nightly. (Patient taking differently: Take 50 mg by mouth nightly.) 30 Tab 1   ??? albuterol-ipratropium (DUO-NEB) 0.5 mg-3 mg(2.5 mg base)/3 mL nebulizer solution 3 mL by Nebulization route once.     ??? Cholecalciferol, Vitamin D3, (VITAMIN D) 1,000 unit Cap Take 50,000 Units by mouth every seven (7) days.       Allergies     Allergies   Allergen Reactions   ??? Aspirin Not Reported This Time     Because of hx ulcers per patient   ??? Opana [Oxymorphone] Rash and Itching   ??? Tylenol [Acetaminophen] Itching     Physical Exam     Visit Vitals   ??? BP 141/79   ??? Pulse 79   ??? Temp 98.7 ??F (37.1 ??C)   ??? Resp 16   ??? Ht  (1.702 m)   ??? Wt 64.9 kg (143 lb)   ??? SpO2 93%   ??? BMI 22.4 kg/m2       CONSTITUTIONAL: Chronically ill-appearing. General pallor. No tachypnea or hypoxia  HEAD: Normocephalic and atraumatic.  EYES: PERRL.  EOM intact. Visual fields intact bilaterally.  CHEST:  No evidence of trauma or deformity.  No crepitus or paradoxical movements.  Chest excursion is normal. + tenderness to touch   CARDIOVASCULAR: Normal S1, S2; no murmurs, rubs, or gallops.  Radial, femoral, and pedal pulses are present and symmetric. There is brisk capillary refill.  RESPIRATORY: Normal chest excursion with respiration.  Breath sounds clear and equal bilaterally.  No wheezes, rhonchi, or rales.  GI: Bowel sounds present. Non-distended.  Non-tender to deep palpation throughout abdomen.  No guarding.  Negative Murphy???s sign.  No palpable organomegaly. There are no abdominal bruits.  There is no palpable pulsatile abdominal mass. Hemoccult negative   BACK:  No evidence of trauma, pain or deformity.  Non-tender to palpation along entirety of midline.    EXT: There are no deformities noted in all four extremities.  There is  full ROM in all extremity joints.  There is no bony or joint tenderness to palpation.  Pulses are equal bilaterally. No calf tenderness, erythema, or size asymmetry  SKIN: Normal for age and race; warm; dry; good turgor; no apparent lesions or exudate. General pallor   NEURO: A & O x 4; grossly unremarkable.  Cranial nerves normal. Motor 5/5 bilaterally. Sensation to light touch intact in all extremities.  Gait normal.   PSYCHOLOGICAL:  The patient???s mood and manner are appropriate.  Appropriate grooming and personal hygiene.  No apparent thoughts of harm to self or others.      Impression and Management Plan   Nursing notes were reviewed. Past medical history was reviewed. Patient initially seen and examined chest pain protocol initiated. Initial testing shows that she is anemic. She is Hemoccult negative. Previous history of GI bleeding. History of CAD.      Diagnostic Studies   Lab:   Results for orders placed or performed during the hospital encounter of 12/26/15   CBC WITH AUTOMATED  DIFF   Result Value Ref Range    WBC 5.3 4.0 - 11.0 1000/mm3    RBC 3.07 (L) 3.60 - 5.20 M/uL    HGB 7.2 (L) 13.0 - 17.2 gm/dl    HCT 16.1 (L) 09.6 - 50.0 %    MCV 78.2 (L) 80.0 - 98.0 fL    MCH 23.5 (L) 25.4 - 34.6 pg    MCHC 30.0 30.0 - 36.0 gm/dl    PLATELET 045 409 - 811 1000/mm3    MPV 9.8 6.0 - 10.0 fL    RDW-SD 58.2 (H) 36.4 - 46.3      NRBC 0 0 - 0      IMMATURE GRANULOCYTES 0.2 0.0 - 3.0 %    NEUTROPHILS 53.1 34 - 64 %    LYMPHOCYTES 36.9 28 - 48 %    MONOCYTES 6.5 1 - 13 %    EOSINOPHILS 2.7 0 - 5 %    BASOPHILS 0.6 0 - 3 %   PRO-BNP   Result Value Ref Range    NT pro-BNP 170.6 (H) 0.0 - 125.0 pg/ml   PROTHROMBIN TIME + INR   Result Value Ref Range    Prothrombin time 21.1 (H) 11.5 - 14.0 seconds    INR 1.9 (H) 0.1 - 1.1     POC TROPONIN-I   Result Value Ref Range    Troponin-I 0.05 0.00 - 0.07 ng/ml   POC CHEM8   Result Value Ref Range    Sodium 142 136 - 145 mEq/L    Potassium 3.8 3.5 - 4.9 mEq/L     Chloride 103 98 - 107 mEq/L    CO2, TOTAL 26 21 - 32 mmol/L    Glucose 107 (H) 74 - 106 mg/dL    BUN 9 7 - 25 mg/dl    Creatinine 0.6 0.6 - 1.3 mg/dl    HCT 26 (L) 38 - 45 %    HGB 8.8 (L) 12.4 - 17.2 gm/dl    CALCIUM,IONIZED 9.14 4.40 - 5.40 mg/dL   TYPE, ABO & RH   Result Value Ref Range    ABO/Rh(D) A Rh Positive         Continuation by Dr. Wenda Overland:      Imaging:    CXR: Negative for acute intrathoracic abnormality    EKG: Normal sinus rhythm, rate of 85 bpm. No evidence of ischemia or infarction on this EKG.    ED [name]/MDM   Patient initially seen and examined by Robie Ridge PA-C. Anemic. Vital signs are all stable. Chest pain protocol initiated. Discussed with my attending.      I hereby certify this patient for admission based upon medical necessity as ??  noted below:  Patient has been stable throughout her emergency pertinent stay. She continues to have chest pain. Her Hemoccult was negative. This was performed due to her history of GI bleeding or peptic ulcer disease. Patient's urine has been the source of some bleeding in the past but does not appear to be the issue this evening. She was consented for transfusion. 2 units were ordered and the transfusion order was given. Transfusion was started in the emergency department in an effort to alleviate the patient's chest pain and other symptoms.    Patient's care was discussed with Dr. Annamarie Dawley from the hospitalist service who agreed to admit the patient for further care.    Critical care time excluding procedures, but including direct patient care, reviewing medical records, evaluating results of diagnostic testing,  discussions with family members, and consulting with physicians:63minutes  Final Diagnosis   Symptomatic anemia  Precordial pain    Disposition   Observation telemetry bed    Concha Pyo DO   December 26, 2015      Documentation performed using voice recognition software.   My signature above authenticates this document and my orders, the final ??  diagnosis (es), discharge prescription (s), and instructions in the Epic record.  If you have any questions please contact [phone].  ??

## 2015-12-26 NOTE — ED Notes (Signed)
PICC nurse at bedside.

## 2015-12-26 NOTE — Progress Notes (Signed)
[facility] Dosing [facility]:     Consult for Warfarin Dosing by Pharmacy by Dr. Vilinda Blanksliszka  Consult provided for this 65 y.o.  female , for indication of Atrial Fibrillation Day of Therapy 1  Dose to achieve an INR goal of 2-3    Order entered for  Warfarin  10 (mg) ordered to be given today at 18:00.     Significant drug interactions: none  Previous dose given Warfarin 10 mg po daily   PT/INR Lab Results   Component Value Date/Time    INR 1.9 12/26/2015 02:41 AM      Platelets Lab Results   Component Value Date/Time    PLATELET 328 12/26/2015 01:43 AM      H/H Lab Results   Component Value Date/Time    HGB 8.8 12/26/2015 01:44 AM        Pharmacy to follow daily and will provide subsequent Warfarin dosing based on clinical status.  Tressie StalkerJennifer Nicole Brandt, [facility] Hospital[facility]ARMD)  Contact information [phone]

## 2015-12-26 NOTE — Consults (Signed)
[facility]  [facility]  NAME:  [name] [name], [name] [name]  SEX:   F  ADMIT: 12/26/2015  DATE OF CONSULT: 12/26/2015  REFERRING PHYSICIAN:    DOB: Jan 01, 1951  MR#    [id number]  ROOM:  CD16  ACCT#  [id number]    cc: Aundria MemsPaul Liszka MD    ATTENDING PHYSICIAN:  Dr. Vilinda BlanksLiszka    CONSULTANT:  Dr. Minus Libertyahman    REASON FOR CONSULTATION:  Epigastric pain and abdominal x-ray suggestive of free air.    HISTORY OF PRESENT ILLNESS:  This is a 65 year old African American female complaining of sudden onset of epigastric pain.  Chest x-ray obtained suggests possible small free air under the left diaphragm.  She denies any nausea or vomiting.  She has history of peptic ulcer disease.  Previously, she had undergone partial gastrectomy for ulcer disease.  She also has history of DVT and coronary artery stent for which she is on Coumadin.  Her INR is 2.1.  She denies any dyspnea, no nausea or vomiting, no hematemesis or melena.  While I was examining her, she received IV Dilaudid and felt much better and stopped crying.  The patient was initially admitted to the hospital with shortness of breath.    PAST MEDICAL HISTORY:  Remarkable for asthma, arthritis of knee, autoimmune disease, coronary artery disease, chronic chest pain,  history of pancreatitis, low backaches, diabetes mellitus, history of DVT, fibromyalgia, history of peptic ulcer disease, right hemiparesis, lupus, peripheral neuropathy, seizure disorder, SLE, vitamin D deficiency.    PAST SURGICAL HISTORY:  Previous procedures include coronary stent, cholecystectomy, partial gastrectomy and hysterectomy.    ALLERGIES:  ASPIRIN, TYLENOL, OXYMORPHONE.      MEDICATIONS:  Include DuoNeb, Elavil, alendronate, iron, Prozac, Zocor, Flexeril, Celexa, Coumadin which is on hold, Proventil and Deltasone.    SOCIAL HISTORY:  She does not smoke or drink.    PHYSICAL EXAMINATION:  GENERAL:  She was crying.  After receiving Dilaudid, she stopped crying and stable.   VITAL SIGNS:  Stable, afebrile.  Blood pressure is normal, heart rate is around 80, saturation is 95%.    SKIN:  She is anemic, not jaundiced.  NECK:  No jugular venous distention.  LUNGS:  Air entry normal both sides.  No added sounds.  CARDIOVASCULAR:  Heart tones normal.  ABDOMEN:  Upper midline scar of previous partial gastrectomy.  Bowel sounds are normoactive.  There is no tenderness, guarding or rebound.  No enlargement of liver or spleen.  No other palpable masses.    LABORATORY DATA:  White count 7, hemoglobin 8.8, platelet count of 335.  Abdominal x-ray questionable there is clear definition of the diaphragm and the gastric wall.    DIAGNOSIS:  Epigastric pain and abdominal x-ray suggestive of free air.    RECOMMENDATIONS:    Stat CT scan of the abdomen and pelvis followed by upright abdominal x-ray including chest in the department.  To me, the patient appears clinically stable.  She stopped crying as soon as she got the Dilaudid and the abdomen is totally benign, no board-like rigidity.  Therefore, hold fresh frozen plasma, hold NG tube until the CT is done.  If CT is negative, medical management; if CT is positive for free air, she will need surgery.      ADDENDUM:    Abdominal x-ray and CT of the abdomen do not show free air.  Recommend medical management and I will be available as needed.    Thank you for the consultation.  ___________________  Grier Mitts MD  Dictated By:.   MLT  D:12/26/2015 19:02:59  T: 12/26/2015 19:59:15  [id number]

## 2015-12-26 NOTE — Progress Notes (Addendum)
MD notified and aware of CT results. Patient will be downgraded to telemetry and will discontinue plasma infusion per MD. Will await new orders and continue to monitor patient.

## 2015-12-26 NOTE — Progress Notes (Signed)
A little after 1100 care partner called to report patient was complaining of chest pain. I asked the care partner to do a STAT EKG. EKG results showed sinus rhythm. Patient stated it was more like heart burn. Paged Dr. Vilinda BlanksLiszka. Asked if he wanted me to discontinue the blood transfusion incase there was a chance of a reaction; he said no. MD came to assess patient. Put in orders to transfer patient and to give dilaudid. Requested for an antacid d/t pt. C/o heart burn. No orders were put in for this (See MAR). Pt. Stated pain was a 10 out of 10 and after dilaudid was given it went down to a 7. Verbal orders were given to hold the plasma until Dr. Vilinda BlanksLiszka notified the nurse.   Called CDU and gave verbal report to Fleet Contrasachel, [place]. Patient was transferred to CDU after blood transfusion and vitals were complete.

## 2015-12-26 NOTE — ED Triage Notes (Signed)
Pt brought in via medic c/o intermittent chest pain describe as tightness . Pt took 0.4 nitro SL stated that the pain subsided. Pt placed in cardiac monitor NSR.

## 2015-12-26 NOTE — Progress Notes (Signed)
Abdominal x-ray and CT did not show free air.  Recommend medical management.  I will be available as needed.  Thank you for the consultation

## 2015-12-26 NOTE — H&P (Signed)
Chart reviewed.  Pt seen and examined on 12/26/15.  Complete H&P to follow.

## 2015-12-26 NOTE — ED Notes (Signed)
Pt started c/o abdominal, nausea and vomiting stated that she vomited 2x yesterday morning.

## 2015-12-26 NOTE — Progress Notes (Signed)
[facility]:     Consult for Vancomycin Dosing by Pharmacy by Dr. Vilinda BlanksLiszka, p  Consult provided for this 65 y.o. year old female , for indication of I/A infection.  Day of Therapy 0    Ht Readings from Last 1 Encounters:   12/26/15 170.2 cm (67")        Wt Readings from Last 1 Encounters:   12/26/15 67.1 kg (147 lb 14.9 oz)        Previous Regimen N/a   Last Level N/a   Other Current Antibiotics zosyn   Significant Cultures N/a   Serum Creatinine Lab Results   Component Value Date/Time    Creatinine 0.7 12/26/2015 06:34 AM      Creatinine Clearance Estimated Creatinine Clearance: 79 mL/min (based on Cr of 0.7).   BUN Lab Results   Component Value Date/Time    BUN 8 12/26/2015 06:34 AM      WBC Lab Results   Component Value Date/Time    WBC 7.0 12/26/2015 08:42 AM      H/H Lab Results   Component Value Date/Time    HGB 8.8 12/26/2015 08:42 AM      Platelets Lab Results   Component Value Date/Time    PLATELET 335 12/26/2015 08:42 AM      Temp 97.8 ??F (36.6 ??C)     Start Vancomycin therapy, with loading dose of 1250 (mg) at 12:00 today - sent(time/date).  Follow with maintenance dose of 1000(mg) at 00:00 (time/date), every 12 hours (frequency).    Dose calculated to approximate a therapeutic trough of 15-20 mcg/mL.    Ordered trough for am of 4/28    Pharmacy to follow daily and will make changes to dose and/or frequency based on clinical status.    Additional recommendations regarding antibiotic therapy:  _________________________________     Pharmacist Sharin Monsharles Craig Harrietta Incorvaia, PHARMD

## 2015-12-27 LAB — GLUCOSE, POC
Glucose (POC): 103 mg/dL (ref 65–105)
Glucose (POC): 119 mg/dL — ABNORMAL HIGH (ref 65–105)
Glucose (POC): 134 mg/dL — ABNORMAL HIGH (ref 65–105)

## 2015-12-27 LAB — TYPE + CROSSMATCH
Blood type code: 6200
Issue date/time: 201704260845
Specimen expiration date: 201704292359
Status info.: TRANSFUSED

## 2015-12-27 LAB — CBC WITH AUTOMATED DIFF
BASOPHILS: 0.4 % (ref 0–3)
EOSINOPHILS: 0.5 % (ref 0–5)
HCT: 34.4 % — ABNORMAL LOW (ref 37.0–50.0)
HGB: 10.9 gm/dl — ABNORMAL LOW (ref 13.0–17.2)
IMMATURE GRANULOCYTES: 0.3 % (ref 0.0–3.0)
LYMPHOCYTES: 11.3 % — ABNORMAL LOW (ref 28–48)
MCH: 24.9 pg — ABNORMAL LOW (ref 25.4–34.6)
MCHC: 31.7 gm/dl (ref 30.0–36.0)
MCV: 78.7 fL — ABNORMAL LOW (ref 80.0–98.0)
MONOCYTES: 6.6 % (ref 1–13)
MPV: 10.3 fL — ABNORMAL HIGH (ref 6.0–10.0)
NEUTROPHILS: 80.9 % — ABNORMAL HIGH (ref 34–64)
NRBC: 0 (ref 0–0)
PLATELET: 337 10*3/uL (ref 140–450)
RBC: 4.37 M/uL (ref 3.60–5.20)
RDW-SD: 56 — ABNORMAL HIGH (ref 36.4–46.3)
WBC: 7.8 10*3/uL (ref 4.0–11.0)

## 2015-12-27 LAB — METABOLIC PANEL, COMPREHENSIVE
ALT (SGPT): 58 U/L (ref 12–78)
AST (SGOT): 94 U/L — ABNORMAL HIGH (ref 15–37)
Albumin: 3.5 gm/dl (ref 3.4–5.0)
Alk. phosphatase: 168 U/L — ABNORMAL HIGH (ref 45–117)
BUN: 7 mg/dl (ref 7–25)
Bilirubin, total: 0.4 mg/dl (ref 0.2–1.0)
CO2: 27 mEq/L (ref 21–32)
Calcium: 8.7 mg/dl (ref 8.5–10.1)
Chloride: 98 mEq/L (ref 98–107)
Creatinine: 0.6 mg/dl (ref 0.6–1.3)
GFR est AA: >60
GFR est non-AA: >60
Glucose: 114 mg/dl — ABNORMAL HIGH (ref 74–106)
Potassium: 4.4 mEq/L (ref 3.5–5.1)
Protein, total: 7.1 gm/dl (ref 6.4–8.2)
Sodium: 133 mEq/L — ABNORMAL LOW (ref 136–145)

## 2015-12-27 LAB — MAGNESIUM: Magnesium: 1.9 mg/dl (ref 1.6–2.6)

## 2015-12-27 LAB — PROTHROMBIN TIME + INR
INR: 2.1 — ABNORMAL HIGH (ref 0.1–1.1)
Prothrombin time: 23.3 seconds — ABNORMAL HIGH (ref 11.5–14.0)

## 2015-12-27 LAB — TSH 3RD GENERATION: TSH: 0.955 u[IU]/mL (ref 0.358–3.740)

## 2015-12-27 MED ORDER — SODIUM CHLORIDE 0.9 % IV PIGGY BACK
1 gram | Freq: Two times a day (BID) | INTRAVENOUS | Status: DC
Start: 2015-12-27 — End: 2015-12-29
  Administered 2015-12-27 – 2015-12-29 (×3): via INTRAVENOUS

## 2015-12-27 MED ORDER — WARFARIN 7.5 MG TAB
7.5 mg | Freq: Once | ORAL | Status: AC
Start: 2015-12-27 — End: 2015-12-27
  Administered 2015-12-27: 22:00:00 via ORAL

## 2015-12-27 MED FILL — ONDANSETRON (PF) 4 MG/2 ML INJECTION: 4 mg/2 mL | INTRAMUSCULAR | Qty: 2

## 2015-12-27 MED FILL — SODIUM CHLORIDE 0.9 % IV PIGGY BACK: INTRAVENOUS | Qty: 50

## 2015-12-27 MED FILL — PROTONIX 40 MG INTRAVENOUS SOLUTION: 40 mg | INTRAVENOUS | Qty: 40

## 2015-12-27 MED FILL — HYDROMORPHONE (PF) 1 MG/ML IJ SOLN: 1 mg/mL | INTRAMUSCULAR | Qty: 1

## 2015-12-27 MED FILL — CITALOPRAM 20 MG TAB: 20 mg | ORAL | Qty: 1

## 2015-12-27 MED FILL — SIMVASTATIN 40 MG TAB: 40 mg | ORAL | Qty: 2

## 2015-12-27 MED FILL — BD POSIFLUSH NORMAL SALINE 0.9 % INJECTION SYRINGE: INTRAMUSCULAR | Qty: 10

## 2015-12-27 MED FILL — COUMADIN 7.5 MG TABLET: 7.5 mg | ORAL | Qty: 1

## 2015-12-27 MED FILL — LAMOTRIGINE 100 MG TAB: 100 mg | ORAL | Qty: 1

## 2015-12-27 NOTE — Progress Notes (Addendum)
Patient admitted on 12/26/2015 from Home with   Chief Complaint   Patient presents with   ??? Chest Pain        The patient has been admitted to the hospital 5 times in the past 12 months.    Previous 4 Admission Dates Admission and Discharge Diagnosis Interventions Barriers Disposition   07/20/2015 Acute cva   Home with home health [facility]   06/16/2015    [facility]   05/20/2015 cp   [facility]   01/07/2015 gastrointestinal hemorrhage   [facility]       Tentative dc plan:    Home with [facility] health[facility] referral sent. Patient has family that lives with her for support. RT eval pending for home oxygen. CM spoke with patient daughter Biagio QuintJohnetta Barresi.  [phone]. sw consult entered in for high utilizer.    Facility if plan no    Anticipated Discharge Date:  TBD    PCP: Lewis MoccasinJasmine A Finch, MD     Specialists:     Cardiology and gastroenteroloy    Dialysis Unit/ chair time / access:    no    DME:    Dan HumphreysWalker, nebulizer    Home Environment:    [place]ives at [address]  [place] [id number]    [phone].     Prior to admission open services:     no    Home Health Agency-     no    Personal Care Agency-    no    Extended Emergency Contact Information  Primary Emergency Contact: Kerby LessEdwards,Gariel Lettrel  Address: [address]           [place] [id number] [place]  Home Phone: [phone]  Relation: Daughter      Transportation:     Family will transport home    Therapy Recommendations:  OT :y/n     Pending notes  PT :y/n     Discharge Recommendations: SNF, Home with home health PT and TBD, pending progress  SLP :y/n    no    RT Home O2 Evaluation :y/n     pending    Wound Care: y/n     no    Change consult (formerly [facility]) :    no    Case Management Assessment    ABUSE/NEGLECT SCREENING   Physical Abuse/Neglect: Denies   Sexual Abuse: Denies   Sexual Abuse: Denies   Other Abuse/Issues: Denies          PRIMARY DECISION MAKER    Primary Decision Maker Name: self               Secondary Decision Maker Name: Maia PettiesGariel Hohn    Secondary Decision Maker Name: Maia PettiesGariel Figley        Secondary Decision Maker Relationship to Patient: Adult child   CARE MANAGEMENT INTERVENTIONS   Readmission Interview Completed: Not Applicable   PCP Verified by CM: Yes           Mode of Transport at Discharge: Self       Transition of Care Consult (CM Consult): Discharge Planning, Home Health                   Physical Therapy Consult: Yes   Occupational Therapy Consult: Yes       Current Support Network: Relative's Home   Reason for Referral: DCP Rounds   History Provided By: Child/Family   Patient Orientation: Alert and Oriented  Support System Response: Cooperative   Previous Living Arrangement: Lives Alone Independent       Prior Functional Level: Assistance with the following:, Mobility   Current Functional Level: Assistance with the following:, Mobility   Primary Language: English   Can patient return to prior living arrangement: Yes   Ability to make needs known:: Fair   Family able to assist with home care needs:: Yes               Types of Needs Identified: Disease Management Education, Treatment Education, Emotional Support       Confirm Follow Up Transport: Family       Plan discussed with Pt/Family/Caregiver: Yes   Freedom of Choice Offered: Yes      DISCHARGE LOCATION   Discharge Placement: Home with home health

## 2015-12-27 NOTE — Progress Notes (Addendum)
Medicine Progress Note    Patient: [name] [name]   Age:  65 y.o.  DOA: 12/26/2015   Admit Dx / CC: symptomatic anemia, chest pain  Chest pain  Pneumoperitoneum  LOS:  LOS: 1 day     Assessment/Plan   Active Problems:    Chest pain (05/19/2015)      Pneumoperitoneum (12/26/2015)      Additional Plan notes   1. Atypical Chest Pain r/o ACS. Trops q8hr neg with 2decho ef60% OK,morphine/dilaudid iv prn, NST in AM  2. Riled out Free air on CXR with epigastric pain. ruled out by repeat KUB and CT abd/pel; increased PPI iv bid and appreciate GI help for a EGD in AM after NST complete as she has had recent prednisone use (not this admission) and has ongoing sharp severe epigastric pain.  3. CAD s/p stent placement. Held coumadin today for EGD in AM  4. Anemia, symptomatic r/o acute blood loss anemia. Received 2u PRBC's hgb 10.9,monitor q12hr,check iron 31,sat 7%,b12/folate levels; may start iv iron if OK with GI.  5. H/o gastritis/esophagitis and PUD S/P partial gastrectomy 10-15 years ago in [place], [place] for recurrent PUD.??Appreciate GI eval with Dr.Ricketts for a possible EGD need. Checked hemoccult stool neg.  6. Hx PE/DVT on coumadin  INR 2.1 in tx range,cont pharmacy dosing once OK with GI  7. Dyspnea. CXR OK  8. Diarrhea. Checked c.diff,stopped empiric zosyn/vanco iv  9. GNB with UTI and Generalized fatigue. Blood cx x2 neg so far, started Rocephin iv and Pt/ot daily  10. GI and DVT Prof. PPI iv bid, coumadin held    DISPO - pt unsafe for transfer or discharge at this time for reasons addressed above, continued hospitalization for ongoing assessment and treatment indicated      Anticipated Date of Discharge: 12/30/15  Anticipated Disposition (home, SNF) : home    Subjective:   Patient seen and examined. Pt had severe sharp stabbing CP in the epigastric/diaphargmatic area with a CXR that suggested Free air; a stat Surgery eval with Dr.Rahman was done and a subsequent KUB showed no free  air. Pt did undergo a CT abd/pel which verified this and fortunately she will not need any emergent intervention at this time. Empiric iv zosyn/vanco abx will be d/c'd and she can cont iv dilaudid as iv morphine earlier was ineffective. She denies any N/V, F/C, but has some reproductive CP w/ mild SOB. She remains afebrile.    Objective:     Visit Vitals   ??? BP 138/74 (BP 1 Location: Right arm, BP Patient Position: Supine)   ??? Pulse 86   ??? Temp 98.6 ??F (37 ??C)   ??? Resp 18   ??? Ht 5' 7" (1.702 m)   ??? Wt 69.4 kg (153 lb)   ??? SpO2 100%   ??? Breastfeeding No   ??? BMI 23.96 kg/m2     Physical Exam:  General appearance: alert, cooperative, no distress, appears stated age  Head: Normocephalic, without obvious abnormality, atraumatic  Neck: supple, trachea midline  Lungs: clear to auscultation bilaterally,no wheezes,rales,or rhonchi  Heart: regular rate and rhythm, S1, S2 normal, no murmur, click, rub or gallop, reproducible CP on substernal palpation  Abdomen: soft, epigastric tender. Bowel sounds normal. No masses,  no organomegaly  Extremities: extremities normal, atraumatic, no cyanosis or edema  Skin: Skin color, texture, turgor normal. No rashes or lesions  Neurologic: Grossly normal    Intake and Output:  Current Shift:     Last three  shifts:  04/25 1901 - 04/27 0700  In: 883.8 [P.O.:240; I.V.:448.8]  Out: 1150 [Urine:1150]    Lab/Data Reviewed:  Recent Results (from the past 24 hour(s))   CBC WITH AUTOMATED DIFF    Collection Time: 12/26/15  8:42 AM   Result Value Ref Range    WBC 7.0 4.0 - 11.0 1000/mm3    RBC 3.61 3.60 - 5.20 M/uL    HGB 8.8 (L) 13.0 - 17.2 gm/dl    HCT 28.6 (L) 37.0 - 50.0 %    MCV 79.2 (L) 80.0 - 98.0 fL    MCH 24.4 (L) 25.4 - 34.6 pg    MCHC 30.8 30.0 - 36.0 gm/dl    PLATELET 335 140 - 450 1000/mm3    MPV 10.1 (H) 6.0 - 10.0 fL    RDW-SD 56.2 (H) 36.4 - 46.3      NRBC 0 0 - 0      IMMATURE GRANULOCYTES 0.3 0.0 - 3.0 %    NEUTROPHILS 63.8 34 - 64 %    LYMPHOCYTES 26.3 (L) 28 - 48 %     MONOCYTES 6.6 1 - 13 %    EOSINOPHILS 2.6 0 - 5 %    BASOPHILS 0.4 0 - 3 %   PROTHROMBIN TIME + INR    Collection Time: 12/26/15  8:42 AM   Result Value Ref Range    Prothrombin time 23.1 (H) 11.5 - 14.0 seconds    INR 2.1 (H) 0.1 - 1.1     TROPONIN I    Collection Time: 12/26/15  8:42 AM   Result Value Ref Range    Troponin-I <0.015 0.000 - 0.045 ng/ml   IRON PROFILE    Collection Time: 12/26/15  8:42 AM   Result Value Ref Range    Iron 31 (L) 50 - 170 mcg/dl    TIBC 440 250 - 450 mcg/dl    Iron % saturation 7 (L) 20 - 45 %   GLUCOSE, POC    Collection Time: 12/26/15 12:32 PM   Result Value Ref Range    Glucose (POC) 93 65 - 105 mg/dL   CULTURE, BLOOD    Collection Time: 12/26/15  1:22 PM   Result Value Ref Range    Blood Culture Result Culture In Progress, Daily Updates To Follow     CULTURE, BLOOD    Collection Time: 12/26/15  1:24 PM   Result Value Ref Range    Blood Culture Result Culture In Progress, Daily Updates To Follow     GLUCOSE, POC    Collection Time: 12/26/15  2:26 PM   Result Value Ref Range    Glucose (POC) 89 65 - 105 mg/dL   URINALYSIS W/ RFLX MICROSCOPIC    Collection Time: 12/26/15  7:30 PM   Result Value Ref Range    Color PALE YELLOW (A) YELLOW,STRAW      Appearance CLEAR CLEAR      Glucose NEGATIVE NEGATIVE,Negative mg/dl    Bilirubin NEGATIVE NEGATIVE,Negative      Ketone NEGATIVE NEGATIVE,Negative mg/dl    Specific gravity <=1.005 1.005 - 1.030      Blood NEGATIVE NEGATIVE,Negative      pH (UA) 7.0 5.0 - 9.0      Protein NEGATIVE NEGATIVE,Negative mg/dl    Urobilinogen 0.2 0.0 - 1.0 mg/dl    Nitrites NEGATIVE NEGATIVE,Negative      Leukocyte Esterase NEGATIVE NEGATIVE,Negative     CBC WITH AUTOMATED DIFF    Collection Time: 12/27/15  2:44 AM  Result Value Ref Range    WBC 7.8 4.0 - 11.0 1000/mm3    RBC 4.37 3.60 - 5.20 M/uL    HGB 10.9 (L) 13.0 - 17.2 gm/dl    HCT 34.4 (L) 37.0 - 50.0 %    MCV 78.7 (L) 80.0 - 98.0 fL    MCH 24.9 (L) 25.4 - 34.6 pg    MCHC 31.7 30.0 - 36.0 gm/dl     PLATELET 337 140 - 450 1000/mm3    MPV 10.3 (H) 6.0 - 10.0 fL    RDW-SD 56.0 (H) 36.4 - 46.3      NRBC 0 0 - 0      IMMATURE GRANULOCYTES 0.3 0.0 - 3.0 %    NEUTROPHILS 80.9 (H) 34 - 64 %    LYMPHOCYTES 11.3 (L) 28 - 48 %    MONOCYTES 6.6 1 - 13 %    EOSINOPHILS 0.5 0 - 5 %    BASOPHILS 0.4 0 - 3 %   MAGNESIUM    Collection Time: 12/27/15  2:44 AM   Result Value Ref Range    Magnesium 1.9 1.6 - 2.6 mg/dl   METABOLIC PANEL, COMPREHENSIVE    Collection Time: 12/27/15  2:44 AM   Result Value Ref Range    Sodium 133 (L) 136 - 145 mEq/L    Potassium 4.4 3.5 - 5.1 mEq/L    Chloride 98 98 - 107 mEq/L    CO2 27 21 - 32 mEq/L    Glucose 114 (H) 74 - 106 mg/dl    BUN 7 7 - 25 mg/dl    Creatinine 0.6 0.6 - 1.3 mg/dl    GFR est AA >60.0      GFR est non-AA >60      Calcium 8.7 8.5 - 10.1 mg/dl    AST (SGOT) 94 (H) 15 - 37 U/L    ALT (SGPT) 58 12 - 78 U/L    Alk. phosphatase 168 (H) 45 - 117 U/L    Bilirubin, total 0.4 0.2 - 1.0 mg/dl    Protein, total 7.1 6.4 - 8.2 gm/dl    Albumin 3.5 3.4 - 5.0 gm/dl   PROTHROMBIN TIME + INR    Collection Time: 12/27/15  2:44 AM   Result Value Ref Range    Prothrombin time 23.3 (H) 11.5 - 14.0 seconds    INR 2.1 (H) 0.1 - 1.1     TSH 3RD GENERATION    Collection Time: 12/27/15  2:44 AM   Result Value Ref Range    TSH 0.955 0.358 - 3.740 uIU/mL   GLUCOSE, POC    Collection Time: 12/27/15  8:25 AM   Result Value Ref Range    Glucose (POC) 103 65 - 105 mg/dL       Lab Results   Component Value Date/Time    CULT NO GROWTH 6 DAYS 04/28/2011 09:13 PM    CULT NO GROWTH 6 DAYS 04/28/2011 09:00 PM       Lab Results   Component Value Date/Time    Iron 31 12/26/2015 08:42 AM    TIBC 440 12/26/2015 08:42 AM    Iron % saturation 7 12/26/2015 08:42 AM    Ferritin 14.0 01/01/2015 11:45 PM       Coagulation:   Recent Labs      12/27/15   0244  12/26/15   0842  12/26/15   0241   PTP  23.3*  23.1*  21.1*   INR  2.1*  2.1*  1.9*  D-Dimer: No results for input(s): HDDIME in the last 72 hours.   Cardiac markers: No results for input(s): CPK, CKND1, MYO in the last 72 hours.    No lab exists for component: CKRMB, TROIP  ABGs: No results for input(s): PH, PO2, PCO2, HCO3 in the last 72 hours.  Amylase/Lipase: No results for input(s): AML, LPSE in the last 72 hours.    Lab Results   Component Value Date/Time    Hemoglobin A1c 5.7 12/26/2015 06:26 AM       Imaging:  2decho 07/18/15  Interpretation Summary  A complete two-dimensional transthoracic echocardiogram was performed (2D, M-  mode, Doppler and color flow Doppler).  The study was technically adequate.  Left ventricular systolic function is normal.  The calculated left ventricular ejection fraction is 53%.  There is mild concentric left ventricular hypertrophy.  The right ventricle is normal in size and function.  The interatrial septum is intact with no evidence for an atrial septal  defect.  Negative bubble study 09/2014  Based on the peak tricuspid regurgitation velocity the estimated right  ventricular systolic pressure is 39 mmHg.  There is trace mitral regurgitation.  Sclerotic edge thickening of the aortic valve with normal cusp mobility.  There is a previous echo from 05/2015 for comparison which shows no  significant changes.  2decho 12/27/15  Interpretation Summary  A complete two-dimensional transthoracic echocardiogram was performed (2D, M-  mode, Doppler and color flow Doppler).  The study was technically difficult.  The left ventricle is normal in size with normal systolic function and normal  wall thickness.  The estimated left ventricular ejection fraction is 60%.  The right ventricle is normal size with normal systolic function.  There are no hemodynamically significant valvular flow abnormalities.  Other findings are noted below.  Prior echo was done 07/18/2015 the EF was reported at 53%. RVSP were  documented at 9mHg.    Medications Reviewed:  Current Facility-Administered Medications   Medication Dose Route Frequency    ??? albuterol-ipratropium (DUO-NEB) 2.5 MG-0.5 MG/3 ML  3 mL Nebulization Q6H PRN   ??? ondansetron (ZOFRAN) injection 4 mg  4 mg IntraVENous Q4H PRN   ??? lamoTRIgine (LaMICtal) tablet 100 mg  100 mg Oral BID   ??? simvastatin (ZOCOR) tablet 80 mg  80 mg Oral QHS   ??? citalopram (CELEXA) tablet 20 mg  20 mg Oral DAILY   ??? sodium chloride (NS) flush 5-10 mL  5-10 mL IntraVENous Q8H   ??? sodium chloride (NS) flush 5-10 mL  5-10 mL IntraVENous PRN   ??? naloxone (NARCAN) injection 0.1 mg  0.1 mg IntraVENous PRN   ??? oxyCODONE IR (ROXICODONE) tablet 5 mg  5 mg Oral Q4H PRN   ??? albuterol CONCENTRATE 2.524m0.5 mL neb soln  2.5 mg Nebulization Q4H PRN   ??? *Warfarin Pharmacy to Dose  1 Each Other Rx Dosing/Monitoring   ??? 0.9% sodium chloride infusion 250 mL  250 mL IntraVENous PRN   ??? HYDROmorphone (PF) (DILAUDID) injection 1 mg  1 mg IntraVENous Q2H PRN   ??? 0.9% sodium chloride infusion  75 mL/hr IntraVENous CONTINUOUS   ??? *vancomycin pharmacy dosing   1 Each Other Rx Dosing/Monitoring   ??? [START ON 12/28/2015] Vanc trough due    Other ONCE   ??? pantoprazole (PROTONIX) 40 mg in sodium chloride 0.9 % 10 mL injection  40 mg IntraVENous Q12H       PaAngelina SheriffMD  P# [id number] 27, 2017  Time spent 32 minutes

## 2015-12-27 NOTE — Progress Notes (Signed)
2D echocardiogram was completed.

## 2015-12-27 NOTE — Other (Signed)
Bedside and Verbal shift change report given to Sharia Reeveandy Padua (oncoming nurse) by Huey Romansivina Anne Aguinaldo Niveah Boerner   (offgoing nurse). Report included the following information SBAR, Kardex, Procedure Summary, MAR, Recent Results and Cardiac Rhythm NSR.

## 2015-12-27 NOTE — Progress Notes (Signed)
OCCUPATIONAL THERAPY MISSED SESSION        Patient: [name] [name] ([age] y.o. female)  Room: CD16/CD16    Date: 12/27/2015  Time:  1630  Primary Diagnosis: symptomatic anemia, chest pain  Chest pain  Pneumoperitoneum    OBJECTIVE:  Patient was not seen for skilled occupational therapy evaluation, secondary to lethargic/unarousable.  Primary RN reported patient had just had pain medication.  Once aroused enough to awaken; patient declined requesting therapist return later.    PLAN: Will follow up as schedule permits.        Jill SideJennifer M Kavar, OT

## 2015-12-27 NOTE — Progress Notes (Signed)
Pharmacist  note, warfarin dosing service:    INR today : 2.1   Goal INR  : 2-3 , check daily  Warfarin :  7.5   mg  x 1 dose   today  Thank you, pharmacy will follow  Kendra L. Mayes PharmD, BCPS

## 2015-12-27 NOTE — Progress Notes (Incomplete)
Lu DuffelAmy Wright NP called to let nursing staff know about keeping patient NPO even after NST for a possible EGD and that to hold morning dose of coumadin. Will endorsed to primary RN.

## 2015-12-27 NOTE — Progress Notes (Signed)
SHORT-TERM GOALS :    1. Pt will be supervision with bed mobility  2. Pt will be able to transfer with supervision  3. Pt will be able to ambulate a distance of 40 feet x 2 using RW with CGA x 1  4. Pt will be supervision with LE exercises x 10-15 reps each  5. Increase strength B LE by 1/2 to1 grade higher  6. Improve dynamic standing balance to fair plus  7. Tolerate OOB to chair TID for 60-90 mins  8. Patient will demonstrate good activity tolerance during functional activities.         PHYSICAL THERAPY EVALUATION       Patient: [name] [name] ([age] y.o. female)  Room: CD16/CD16    Date: 12/27/2015  Start Time:  1145  End Time:  1215    Primary Diagnosis: symptomatic anemia, chest pain  Chest pain  Pneumoperitoneum          Precautions: Falls, Poor Safety Awareness and chronic pain.  Weight bearing precautions: FWB and unable to maintain    Orders reviewed, chart reviewed, and initial evaluation completed on [name] [name].  Discussed with nursing.    ASSESSMENT :  Based on the objective data described below, the patient presents with   Decreased Strength  Decreased ADL/Functional Activities  Decreased Transfer Abilities  Decreased Ambulation Ability/Technique  Decreased Balance  Increased Pain  Decreased Activity Tolerance  Increased Fatigue  Increased Shortness of Breath  Decreased Knowledge of Precautions  Decreased Independence with Home Exercise Program  anxiety.    Patient will benefit from skilled  Physical Therapy intervention to address the above impairments.    Patient???s rehabilitation potential is considered to be Good     PLAN :  Planned Interventions:  Functional mobility training Gait Training Balance Training Therapeutic exercises Therapeutic activities AD training Patient/caregiver education    Frequency/Duration: Patient will be followed by physical therapy 3x / Week to address goals.    Recommendations:  Physical Therapy and Occupational Therapy   Discharge Recommendations: SNF, Home with home health PT and TBD, pending progress  Further Equipment Recommendations for Discharge: to be determined - patient has rollator at home        Please refer to Patient Education and Care Plan sections of chart for additional details    SUBJECTIVE:   Patient: patient agrees to PT    OBJECTIVE DATA SUMMARY:   Present illness history:   Problem List  Date Reviewed: 01/03/2015          Codes Class Noted    Pneumoperitoneum ICD-10-CM: K66.8  ICD-9-CM: 568.89  12/26/2015        Acute CVA (cerebrovascular accident) [facility](HCC) ICD-10-CM: I63.9  ICD-9-CM: 434.91  07/17/2015        Chest pain ICD-10-CM: R07.9  ICD-9-CM: 786.50  05/19/2015        Gastrointestinal hemorrhage ICD-10-CM: K92.2  ICD-9-CM: 578.9  01/01/2015        GI bleed ICD-10-CM: K92.2  ICD-9-CM: 578.9  11/26/2014        Back pain, lumbosacral ICD-10-CM: M54.5  ICD-9-CM: 724.2, 724.6  09/11/2011        Arthritis of knee ICD-10-CM: M17.10  ICD-9-CM: 716.96  01/04/2011        Encounter for long-term (current) use of other medications ICD-10-CM: Z79.899  ICD-9-CM: V58.69  01/04/2011        Abdominal pain, generalized ICD-10-CM: R10.84  ICD-9-CM: 789.07  01/04/2011        Neuropathy in diabetes (HCC)  ICD-10-CM: E11.40  ICD-9-CM: 250.60, 357.2  01/04/2011        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  ICD-9-CM: 781.3  01/04/2011        Lupus (HCC) ICD-10-CM: M32.9  ICD-9-CM: 710.0  12/03/2010        Osteoarthrosis, unspecified whether generalized or localized, unspecified site ICD-10-CM: M19.90  ICD-9-CM: 715.90  12/03/2010        Neuralgia, neuritis, and radiculitis, unspecified ICD-10-CM: IMO0002  ICD-9-CM: 729.2  12/03/2010        Myalgia and myositis, unspecified ICD-10-CM: IMO0001  ICD-9-CM: 729.1  12/03/2010        Pain in limb ICD-10-CM: M79.609  ICD-9-CM: 729.5  12/03/2010        Pain in joint, multiple sites ICD-10-CM: M25.50  ICD-9-CM: 719.49  12/03/2010        Type II or unspecified type diabetes mellitus without mention of  complication, not stated as uncontrolled ICD-10-CM: E11.9  ICD-9-CM: 250.00  12/03/2010        Rib pain ICD-10-CM: R07.81  ICD-9-CM: 786.50  10/10/2010             Past Medical history:   Past Medical History:   Diagnosis Date   ??? Acute hypercapnic respiratory failure (HCC) 02/05/2013    W/ Encephalopathy requiring BiPAP, Etiology Uncertain however Possible Narcotic Benzodiazipine Related?    ??? Adjustment disorder with mixed anxiety and depressed mood    ??? Aneurysm of internal carotid artery    ??? Arthritis of knee    ??? Asthma    ??? Autoimmune disease (HCC)    ??? CAD (coronary artery disease)    ??? Cervicalgia    ??? Chest pain 03/17/2014    Dobutamine Stress Echo: NORMAL WALL MOTION AND GLOBAL SYSTOLIC FUNCTION AT REST AND AFTER?? STRESS/EXERCISE WITH APPROPRIATE AUGMENTATION OF FUNCTION IN ALL SEGMENTS. NO EVIDENCE OF INDUCIBLE ISCHEMIA AT ADEQUATE HEART RATE WITH DOBUTAMINE STRESS.??            ??? Chronic low back pain 10/22/2014    [facility] MRI Lumbar w/o contrast: Moderate facet arthropathy at L4-L5 and L5-S1. Mild/moderate foraminal stenosis at these levels. Disc bulge with annular tear at L4-L5.    ??? Chronic pain syndrome    ??? Coccydynia    ??? Diabetes mellitus     NIDDM   ??? DVT (deep venous thrombosis) (HCC) 05/19/2007    [facility] CTA: 1. Small nonocclusive pulmonary embolus in a subsegmental branch to the left lower lobe. Larger filling defect in a segmental branch to the right lower lobe, not as low in attenuation as is normally seen with a pulmonary embolus, however is seen in multiple planes and remains concerning for an additional nonocclusive pulmonary embolus.   ??? Fibromyalgia    ??? Gastric ulcer    ??? Gastritis 01/03/2015    [facility] Admission, EGD + H. Pylori/Gastritis   ??? Generalized osteoarthritis of multiple sites    ??? GI bleed 01/01/2015    [facility] Admit 5/2-01/07/2015   ??? H. pylori infection 01/03/2015    [facility] Admission, EGD + H. Pylori/Gastritis   ??? Hemiparesis, right (HCC)    ??? Hyperlipidemia    ??? Hypertension     ??? Irregular sleep-wake rhythm    ??? Knee joint pain    ??? Lumbar spondylosis     Orthopedic Spine Surgeon: Dr. Ree Kida L. Siegel    ??? Lupus (HCC)    ??? Muscle spasm    ??? Normocytic anemia     Chronic Iron Deficiency Anemia   ???  Osteoporosis    ??? Pancreatitis    ??? Peripheral neuropathy (HCC)    ??? Pulmonary embolism (HCC)     Multiple   ??? Seizure (HCC)    ??? Seizures (HCC)    ??? Sensory ataxia    ??? SLE (systemic lupus erythematosus) (HCC)    ??? Stroke [facility])    ??? Subclinical hypothyroidism    ??? Vitamin D deficiency        PRIOR LEVEL OF FUNCTION / LIVING SITUATION     Information was obtained by:   patient  Home environment:   Lives with Family, House, 1 story, Entrance steps 2 steps, Hand rail on one side  Prior level of function:   unable to drive, but able to sit in car, ADLs with assist, Ambulates with device and house hold ambulatory  Home equipment: rollator      Patient found: Bed, Telemetry, ICU/stepdown equip, Oxygen, IV, Family present and Nursing present.    Pain Assessment before PT session: Not rated  Pain Location:  Pain "all over"  Pain Assessment after PT session: Not rated  Pain Location:  Pain "all over"            Yes, patient had pain medications            No, Patient has not had pain medications            Nurse notified    COGNITIVE STATUS:     Patient is alert, oriented x3 with no cognitive deficits , anxious    EXTREMITIES ASSESSMENT:      Tone & Sensation:   Normal    Proprioception:   Intact    Strength:    bilateral, lower extremity(s), grossly 3 to 3+/5    Range Of Motion:  bilateral, lower extremity(s), grossly WFL     Functional mobility and balance status:     Mobility:  Supine to sit -  min. assist, increased time, 1 person and rail  Sit to Supine -  not tested  Sit to Stand -  mod. assist, verbal cues, manual cues, safety concerns, increased time, 1 person, rail, increased height of bed and using RW  Stand to Sit -  mod. assist, verbal cues, manual cues, increased time, 1 person and rail     Transfers:  Transfer to bedside chair with mod A x 1    Balance:  Static Sitting Balance -  good  Dynamic Sitting Balance -  good-  Static Standing Balance -  fair and w/ assistive device  Dynamic Standing Balance -  fair- and w/ assistive device    Activity Tolerance: fair-.    Ambulation/Gait Training:  A few steps from bed to chair only, unsteady, narrow BOS, forward flexed posture, downward gaze, dec step length/height, pt fatigues easily and pain limiting Rolling walker mod assist, verbal cues, manual cues, safety concerns, increased time and 1 person      Stair Training:  not tested      TREATMENT:   Patient received/participated in 15 minutes of treatment (therapeutic activities) immediately following evaluation and/or educational instruction during/immediately following PT evaluation.    Transfer to and from bedside commode with mod A x 1, mod A x 1 required for safety with toileting and hygiene activity      Activity Tolerance:   fair-, requires oxygen, requires rest breaks, complaint of fatigue after standing activity, observed SOB with activity and pain limiting    Final Location:   bedside chair, all needs  close, agrees to call for assistance, nurse notified , nursing present and oxygen    COMMUNICATION/EDUCATION:     Barriers to Learning/Limitations:  yes;  other anxiety  Education provided patient on  Patient, Benefit of activity while hospitalized, Call for assistance, Out of bed 2-3 times/day, Staff assistance with mobility, Changes positions frequently, Sit out of bed for 45-60 minutes or as tolerated, Safety, Functional mobility, Demonstrates adequately, Verbalized understanding, Reinforce needed, Teaching method, Verbal and Role of PT    Thank you for this referral.  Joanalyne A. Lynford Humphrey, PT  Pager # : [phone]

## 2015-12-27 NOTE — Other (Signed)
Assumed care from Randy Padua, RN

## 2015-12-27 NOTE — Consults (Addendum)
A member of [facility]    Consult Note    Patient: [name] [name] Age: 65 y.o. Sex: female    Date of Birth: 17-Jun-1951 Admit Date: 12/26/2015 PCP: Lewis Moccasin, MD   MRN: [id number]  CSN: [id number]         IMPRESSION:     1. Epigastric pain/chest pain associated with nausea/retching - DDx symptomatic GERD, anastomotic ulcer, esophagitis, gastritis.   2. Hx of H. Pylori - May 2016 s/p ABX treatment.   3. Atypical chest pain - troponin negative.   4. Chronic iron deficiency anemia - Hemoccult-positive brown stool.  5. S/P partial gastrectomy 10  years ago in [place], [place] for recurrent PUD.   6. Chronic anorexia  7. Chronic Coumadin therapy   8. CAD s/p stents.   9. Chronic NSAID use  10. Chronic narcotic use  11. Chronic pain  12. Hx DVT/PE  13. Recent steroid therapy - patient is unclear on why she was on prednisone prior to her admission.  14. Comorbidities: Lupus, diabetes type 2, seizure disorder, fibromyalgia, vertigo, hypertension, hypothyroidism, hyperlipidemia, Hx of CVA, osteoarthritis.      RECOMMENDATIONS:     ?? Recommend full liquid diet, until nausea improves.  ?? Continue Protonix 40 mg IV every 12 hours.   ?? Continue antiemetic.  ?? Recommend nuclear stress test. Discussed with Dr. Iva Boop.  ?? Hold Coumadin.  ?? Tentative EGD Friday.     HPI:     I was asked by Dr. Arby Barrette to evaluate [name] [name] for epigastric pain.  She is a 65 y.o. female admitted on 12/26/2015 for atypical chest pain. Patient reports she started experiencing chest pressure, chest pain, and shortness of breath yesterday. It is located between her breasts and is reproducible with touch. There is experiencing nausea and "dry heaves" for early after her chest pain began. Pain is persistent and described as "squeezing pressure" around her chest. Patient takes BCs at least once a week for headaches, and is on chronic narcotic therapy for chronic back  pain. Record reflects she was on prednisone prior to her admission however, she is unclear as to why she is on steroid therapy. Patient was seen by [place] in May 2016 for vomiting, epigastric pain, early satiety, and weight loss. EGD 01/03/2015 was performed by Dr. Federico Flake findings B2 small gastric remnant, mild erythema-biopsies obtained, jejunal biopsies obtained to rule out Celiac. Pathology gastric biopsies positive for H. pylori, chronic gastritis with focal hyperplastic features and mild reactive mucosal atypia. Small bowel biopsies benign negative for features of Celiac. She was hospitalized in March of 2016 for symptomatic anemia and weight loss. EGD 11/28/2014 findings of retained food at anastomosis w/ friable margin however, partially obscured by food. Patient was to f/u in our office in April 2016 for Pill Cam, but she reports  "I did feel well enough to have any more tests". Screening colonoscopy 06/30/2014 by Dr. Floyde Parkins findings of diverticulosis, internal hemorrhoids; recommended 10 year follow up. Patient denies melena, hematochezia, hematemesis, dysphagia, odynophagia, fevers, sick contacts, recent antibiotic/steroid therapy. She appears nontoxic and is hemodynamically stable on exam.     Patient Active Problem List    Diagnosis Date Noted   ??? Pneumoperitoneum 12/26/2015   ??? Acute CVA (cerebrovascular accident) (HCC) 07/17/2015   ??? Chest pain 05/19/2015   ??? Gastrointestinal hemorrhage 01/01/2015   ??? GI bleed 11/26/2014   ??? Back pain, lumbosacral 09/11/2011   ??? Arthritis of knee 01/04/2011   ???  Encounter for long-term (current) use of other medications 01/04/2011   ??? Abdominal pain, generalized 01/04/2011   ??? 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 Lupus (HCC) 12/03/2010   ??? Osteoarthrosis, unspecified whether generalized or localized, unspecified site 12/03/2010   ??? Neuralgia, neuritis, and radiculitis, unspecified 12/03/2010    ??? Myalgia and myositis, unspecified 12/03/2010   ??? Pain in limb 12/03/2010   ??? Pain in joint, multiple sites 12/03/2010   ??? Type II or unspecified type diabetes mellitus without mention of complication, not stated as uncontrolled 12/03/2010   ??? Rib pain 10/10/2010      Past Medical History:   Diagnosis Date   ??? Acute hypercapnic respiratory failure (HCC) 02/05/2013    W/ Encephalopathy requiring BiPAP, Etiology Uncertain however Possible Narcotic Benzodiazipine Related?    ??? Adjustment disorder with mixed anxiety and depressed mood    ??? Aneurysm of internal carotid artery    ??? Arthritis of knee    ??? Asthma    ??? Autoimmune disease (HCC)    ??? CAD (coronary artery disease)    ??? Cervicalgia    ??? Chest pain 03/17/2014    Dobutamine Stress Echo: NORMAL WALL MOTION AND GLOBAL SYSTOLIC FUNCTION AT REST AND AFTER?? STRESS/EXERCISE WITH APPROPRIATE AUGMENTATION OF FUNCTION IN ALL SEGMENTS. NO EVIDENCE OF INDUCIBLE ISCHEMIA AT ADEQUATE HEART RATE WITH DOBUTAMINE STRESS.??            ??? Chronic low back pain 10/22/2014    [facility] MRI Lumbar w/o contrast: Moderate facet arthropathy at L4-L5 and L5-S1. Mild/moderate foraminal stenosis at these levels. Disc bulge with annular tear at L4-L5.    ??? Chronic pain syndrome    ??? Coccydynia    ??? Diabetes mellitus     NIDDM   ??? DVT (deep venous thrombosis) (HCC) 05/19/2007    [facility] CTA: 1. Small nonocclusive pulmonary embolus in a subsegmental branch to the left lower lobe. Larger filling defect in a segmental branch to the right lower lobe, not as low in attenuation as is normally seen with a pulmonary embolus, however is seen in multiple planes and remains concerning for an additional nonocclusive pulmonary embolus.   ??? Fibromyalgia    ??? Gastric ulcer    ??? Gastritis 01/03/2015    [facility] Admission, EGD + H. Pylori/Gastritis   ??? Generalized osteoarthritis of multiple sites    ??? GI bleed 01/01/2015    [facility] Admit 5/2-01/07/2015   ??? H. pylori infection 01/03/2015     [facility] Admission, EGD + H. Pylori/Gastritis   ??? Hemiparesis, right (HCC)    ??? Hyperlipidemia    ??? Hypertension    ??? Irregular sleep-wake rhythm    ??? Knee joint pain    ??? Lumbar spondylosis     Orthopedic Spine Surgeon: Dr. Ree Kida L. Siegel    ??? Lupus (HCC)    ??? Muscle spasm    ??? Normocytic anemia     Chronic Iron Deficiency Anemia   ??? Osteoporosis    ??? Pancreatitis    ??? Peripheral neuropathy (HCC)    ??? Pulmonary embolism (HCC)     Multiple   ??? Seizure (HCC)    ??? Seizures (HCC)    ??? Sensory ataxia    ??? SLE (systemic lupus erythematosus) (HCC)    ??? Stroke [facility])    ??? Subclinical hypothyroidism    ??? Vitamin D deficiency      Allergies   Allergen Reactions   ??? Tape [Adhesive]  Itching     Paper tape      ??? Aspirin Not Reported This Time     Because of hx ulcers per patient   ??? Opana [Oxymorphone] Rash and Itching   ??? Tylenol [Acetaminophen] Itching       Prior to Admission Medications   Prescriptions Last Dose Informant Patient Reported? Taking?   Cholecalciferol, Vitamin D3, (VITAMIN D) 1,000 unit Cap   Yes Yes   Sig: Take 50,000 Units by mouth every seven (7) days.   FLUoxetine (PROZAC) 40 mg capsule   No Yes   Sig: Take 1 Cap by mouth daily.   albuterol (PROVENTIL HFA) 90 mcg/actuation inhaler   No Yes   Sig: Take 1 Puff by inhalation as needed.   albuterol (PROVENTIL HFA, VENTOLIN HFA, PROAIR HFA) 90 mcg/actuation inhaler   No Yes   Sig: Take 2 Puffs by inhalation every four (4) hours as needed for Wheezing.   albuterol-ipratropium (DUO-NEB) 0.5 mg-3 mg(2.5 mg base)/3 mL nebulizer solution   Yes Yes   Sig: 3 mL by Nebulization route once.   alendronate-vitamin d3 70-2,800 mg-unit per tablet   Yes Yes   Sig: Take 1 Tab by mouth every seven (7) days.   amitriptyline (ELAVIL) 75 mg tablet   No Yes   Sig: Take 1 Tab by mouth nightly.   Patient taking differently: Take 50 mg by mouth nightly.   citalopram (CELEXA) 20 mg tablet   Yes Yes   Sig: Take 20 mg by mouth daily.   cyclobenzaprine (FLEXERIL) 5 mg tablet   Yes Yes    Sig: Take 10 mg by mouth three (3) times daily as needed for Muscle Spasm(s).   ferrous gluconate 324 mg (36 mg iron) tab   No Yes   Sig: Take 1 Tab by mouth Before breakfast and dinner.   lamoTRIgine (LAMICTAL) 100 mg tablet   No Yes   Sig: Take 1 Tab by mouth two (2) times a day.   oxyCODONE IR (OXY-IR) 15 mg immediate release tablet   No Yes   Sig: Take 1 Tab by mouth four (4) times daily. Max Daily Amount: 60 mg.   predniSONE (DELTASONE) 20 mg tablet   No Yes   Sig: 2 po every day for 3 days then 1 po every day for 2 days with food   simvastatin (ZOCOR) 80 mg tablet   Yes Yes   Sig: Take 80 mg by mouth nightly.   warfarin (COUMADIN) 5 mg tablet   No Yes   Sig: Take 2 tablets (10mg ) daily on 10/15, 10/16 & have INR checked 10/17      Facility-Administered Medications: None     Current Facility-Administered Medications   Medication Dose Route Frequency Provider Last Rate Last Dose   ??? albuterol-ipratropium (DUO-NEB) 2.5 MG-0.5 MG/3 ML  3 mL Nebulization Q6H PRN Arvil PersonsJennifer H Salch, DO       ??? ondansetron [facility](ZOFRAN) injection 4 mg  4 mg IntraVENous Q4H PRN Arvil PersonsJennifer H Salch, DO   4 mg at 12/27/15 16100528   ??? lamoTRIgine (LaMICtal) tablet 100 mg  100 mg Oral BID Otilio CarpenAnthony A Morris, MD   100 mg at 12/26/15 2119   ??? simvastatin (ZOCOR) tablet 80 mg  80 mg Oral QHS Otilio CarpenAnthony A Morris, MD   80 mg at 12/26/15 2119   ??? citalopram (CELEXA) tablet 20 mg  20 mg Oral DAILY Otilio CarpenAnthony A Morris, MD   20 mg at 12/26/15 [id number]   ??? sodium chloride (  NS) flush 5-10 mL  5-10 mL IntraVENous Q8H Otilio Carpen, MD   10 mL at 12/27/15 0528   ??? sodium chloride (NS) flush 5-10 mL  5-10 mL IntraVENous PRN Otilio Carpen, MD       ??? naloxone (NARCAN) injection 0.1 mg  0.1 mg IntraVENous PRN Otilio Carpen, MD       ??? oxyCODONE IR (ROXICODONE) tablet 5 mg  5 mg Oral Q4H PRN Otilio Carpen, MD       ??? albuterol CONCENTRATE 2.5mg /0.5 mL neb soln  2.5 mg Nebulization Q4H PRN Otilio Carpen, MD        ??? *Warfarin Pharmacy to Dose  1 Each Other Rx Dosing/Monitoring Aundria Mems V, MD       ??? 0.9% sodium chloride infusion 250 mL  250 mL IntraVENous PRN Aundria Mems V, MD       ??? HYDROmorphone (PF) (DILAUDID) injection 1 mg  1 mg IntraVENous Q2H PRN Aundria Mems V, MD   1 mg at 12/27/15 0708   ??? 0.9% sodium chloride infusion  75 mL/hr IntraVENous CONTINUOUS Elenor Quinones, MD 75 mL/hr at 12/26/15 1404 75 mL/hr at 12/26/15 1404   ??? *vancomycin pharmacy dosing   1 Each Other Rx Dosing/Monitoring Elenor Quinones, MD       ??? [START ON 12/28/2015] Vanc trough due    Other ONCE Elenor Quinones, MD       ??? pantoprazole (PROTONIX) 40 mg in sodium chloride 0.9 % 10 mL injection  40 mg IntraVENous Q12H Elenor Quinones, MD   40 mg at 12/26/15 2119     Past Surgical History:   Procedure Laterality Date   ??? CARDIAC SURG PROCEDURE UNLIST      Cardiac Cath PCI w/ Stents   ??? HX CHOLECYSTECTOMY     ??? HX GI      Partial Gastrectomy    ??? HX GI  01/03/2015    [facility] EGD by Dr. Smitty Cords D. Waldholtz: S/P B-2 Gastric Resection, Gastritis in small remnant, Bx of Jejunum r/o Celiac Disease and of Stomach. + Gastritis + H. Pylori   ??? HX HYSTERECTOMY       Family History   Problem Relation Age of Onset   ??? Diabetes Maternal Grandmother      Social History     Social History   ??? Marital status: DIVORCED     Spouse name: N/A   ??? Number of children: N/A   ??? Years of education: N/A     Occupational History   ??? Not on file.     Social History Main Topics   ??? Smoking status: Former Smoker   ??? Smokeless tobacco: Never Used   ??? Alcohol use No   ??? Drug use: No   ??? Sexual activity: Not on file     Other Topics Concern   ??? Not on file     Social History Narrative       Review of Systems:    Constitutional:  Positive for weight loss d/t poor appetite over the last year. No fever, chills.   Eyes: No visual symptoms.  ENT: No sore throat, runny nose or ear pain.  Respiratory: No cough, dyspnea or wheezing.   Cardiovascular: Positive for chest pain, pressure, tightness, and heaviness.   Gastrointestinal: Positive for epigastric pain, nausea, retching. Loose stool since her gastric surgery 10 years ago. No dysphagia, odynophagia, hematemesis, melena, hematochezia, or changes in bowel habits.  Genitourinary:  No dysuria, frequency, or urgency.  Musculoskeletal: Positive for back pain, knee pain.   Integumentary: No rashes.  Neurological: Positive for chronic headaches.     Objective:     Visit Vitals   ??? BP 138/74 (BP 1 Location: Right arm, BP Patient Position: Supine)   ??? Pulse 86   ??? Temp 98.6 ??F (37 ??C)   ??? Resp 18   ??? Ht 5\' 7"  (1.702 m)   ??? Wt 69.4 kg (153 lb)   ??? SpO2 100%   ??? Breastfeeding No   ??? BMI 23.96 kg/m2     Temp (24hrs), Avg:98.3 ??F (36.8 ??C), Min:97.8 ??F (36.6 ??C), Max:98.9 ??F (37.2 ??C)    Vitals:    12/26/15 2353 12/26/15 2353 12/27/15 0514 12/27/15 0828   BP: 134/74 134/74 149/71 138/74   Pulse: 84 83 87 86   Resp: 16 16 15 18    Temp:  98.2 ??F (36.8 ??C) 98.4 ??F (36.9 ??C) 98.6 ??F (37 ??C)   SpO2: 98% 98% 100% 100%   Weight:   65.5 kg (144 lb 6.4 oz) 69.4 kg (153 lb)   Height:           Intake/Output Summary (Last 24 hours) at 12/27/15 0835  Last data filed at 12/26/15 2232   Gross per 24 hour   Intake           688.75 ml   Output             1150 ml   Net          -461.25 ml       Physical Exam:    Constitutional: Patient withdrawn, affect, poor historian regarding her medical history and medications.   HEENT: Conjunctivae clear. Mucous membranes moist, non-erythematous. Neck: supple, non tender, symmetrical.   Respiratory: Lungs clear to auscultation, nonlabored respiration. No tachypnea or accessory muscle use.  Cardiovascular: Reproducible pain over her sternum between breasts. Heart regular rate without murmur. No peripheral edema or significant variscosities.    Gastrointestinal:  Abdomen soft, nontender, bowel sounds normal.  Musculoskeletal: Nail beds pink with prompt capillary refill.   Integumentary: Warm and moist.   Neurologic: Alert and oriented.    DATA    CBC w/Diff   Lab Results   Component Value Date/Time    WBC 7.8 12/27/2015 02:44 AM    WBC 7.0 12/26/2015 08:42 AM    WBC 5.3 12/26/2015 01:43 AM    RBC 4.37 12/27/2015 02:44 AM    RBC 3.61 12/26/2015 08:42 AM    RBC 3.07 (L) 12/26/2015 01:43 AM    HGB 10.9 (L) 12/27/2015 02:44 AM    HGB 8.8 (L) 12/26/2015 08:42 AM    HGB 8.8 (L) 12/26/2015 01:44 AM    HCT 34.4 (L) 12/27/2015 02:44 AM    HCT 28.6 (L) 12/26/2015 08:42 AM    HCT 26 (L) 12/26/2015 01:44 AM    MCV 78.7 (L) 12/27/2015 02:44 AM    MCV 79.2 (L) 12/26/2015 08:42 AM    MCV 78.2 (L) 12/26/2015 01:43 AM    MCH 24.9 (L) 12/27/2015 02:44 AM    MCH 24.4 (L) 12/26/2015 08:42 AM    MCH 23.5 (L) 12/26/2015 01:43 AM    MCHC 31.7 12/27/2015 02:44 AM    MCHC 30.8 12/26/2015 08:42 AM    MCHC 30.0 12/26/2015 01:43 AM    RDW 23.5 (H) 02/26/2015 08:20 PM    RDW 17.1 (H) 04/28/2011 09:01 PM    PLT 337 12/27/2015 02:44 AM  PLT 335 12/26/2015 08:42 AM    PLT 328 12/26/2015 01:43 AM     Lab Results   Component Value Date/Time    GRANS 80.9 (H) 12/27/2015 02:44 AM    GRANS 63.8 12/26/2015 08:42 AM    GRANS 53.1 12/26/2015 01:43 AM    MONOS 6.6 12/27/2015 02:44 AM    MONOS 6.6 12/26/2015 08:42 AM    MONOS 6.5 12/26/2015 01:43 AM    EOS 0.5 12/27/2015 02:44 AM    EOS 2.6 12/26/2015 08:42 AM    EOS 2.7 12/26/2015 01:43 AM    BASOS 0.4 12/27/2015 02:44 AM    BASOS 0.4 12/26/2015 08:42 AM    BASOS 0.6 12/26/2015 01:43 AM        Hepatic Function   Recent Labs      12/27/15   0244   ALT  58   SGOT  94*   TBILI  0.4   AP  168*   ALB  3.5   TP  7.1        Pancreatic Enzymes   No results for input(s): AML, LPSE in the last 72 hours.     Basic Metabolic Profile   Recent Labs      12/27/15   0244  12/26/15   0634  12/26/15   0144   NA  133*  144  142   K  4.4  4.2  3.8   CL  98  109*  103   CO2  27  27  26    BUN  7  8  9    GLU  114*  142*  107*   CREA  0.6  0.7  0.6   CA  8.7  8.2*   --    MG  1.9  2.0   --          POC Glucose  No components found for: [facility]    Coagulation   Recent Labs      12/27/15   0244  12/26/15   0842  12/26/15   0241   PTP  23.3*  23.1*  21.1*   INR  2.1*  2.1*  1.9*          Stool FOB Lab Results   Component Value Date/Time    Occult blood, stool negative 12/26/2015 03:03 AM    Occult blood, stool negative 06/15/2015 12:01 AM    Occult blood, stool positive 01/01/2015 04:34 PM           Radiology Results  Xr Chest Pa Lat    Result Date: 12/26/2015  Clinical history: Chest pain, abdominal pain, nausea and vomiting EXAMINATION: PA and lateral views of the chest 12/26/2015 Correlation: 09/25/2015 FINDINGS: Trachea and heart size are within normal limits. Lungs are clear. Lucency beneath the left diaphragm, may be related to superimposed bowel loop or pneumoperitoneum. Degenerative changes of the spine. There is hyperinflation.     IMPRESSION: 1. COPD. 2. Possible pneumoperitoneum CT scan of the abdomen suggested for further evaluation. Findings discussed with Dr. Annamarie Dawley 0 [phone] enlarged.     Xr Abd Acute W 1 V Chest    Result Date: 12/26/2015  Exam: Portable upright abdomen, supine abdomen Clinical indication: Dyspnea Comparison: None Results:   Heart normal.  Mediastinal contours are normal.  No consolidation. No pleural effusions.    Surgical clips in the upper abdomen. No free air. No dilated loops of bowel. Gas in the rectum. Nonspecific air-filled nondilated loops of small bowel centrally. A  few air-fluid levels in nondilated small bowel.   No free air is seen under the hemidiaphragms.   Osseous structures intact.     IMPRESSION:  1.  No free air. 2. Nonobstructive bowel gas pattern. No frank obstruction.     Ct Abd Pelv W Cont    Result Date: 12/26/2015  EXAMINATION: CT ABD PELV W CONT CLINICAL INDICATION: r/o pneumoperitoneum vs dilated loop of bowel w/abd pain (see CXR)  abdominal pain COMPARISON:  June 14, 2015 TECHNIQUE:  Multiple contiguous axial CT images at 5  mm  increments were obtained from the bases of the lungs to the  pubic symphysis with intravenous contrast. FINDINGS: Intrahepatic and extra hepatic biliary duct dilatation. Centrally the hepatic bile duct measures up to 18 mm. Common bile duct in the pancreatic neck measures up to 1.8 cm similar to the prior exam. Pancreatic head is atrophic. Surgical clips gallbladder fossa. Postop changes about the stomach. Increasing bibasilar subpleural infiltrates. No free air. The graft marked distention of the bladder. Appendix tentatively identified as a normal structure. No hydronephrosis. No renal masses. Thoracolumbar scoliosis.     IMPRESSION: 1. Persistent marked dilatation of the intrahepatic and extrahepatic biliary ducts and pancreatic duct, similar to prior exam. 2. Marked distention of the bladder more prominent than prior exam. Patient likely would benefit from Foley catheter. 3. Appendix tentatively identified as a normal structure. 4. No free air.         Nelta Numbers, NP, FNP-BC  December 27, 2015  [facility]  [phone] (c)  [facility] [phone]  [place] Office [phone]

## 2015-12-27 NOTE — Other (Signed)
Bedside and Verbal shift change report given to Harvie Heckandy RN (oncoming nurse) by Rhae Hammockivina RN/ Aram Beechamynthia RN (offgoing nurse). Report included the following information SBAR, Kardex, MAR, Recent Results and Cardiac Rhythm NSR.

## 2015-12-28 ENCOUNTER — Inpatient Hospital Stay: Admit: 2015-12-28 | Payer: MEDICAID | Primary: Internal Medicine

## 2015-12-28 LAB — CULTURE, URINE: Isolate: >100000 — CR

## 2015-12-28 LAB — METABOLIC PANEL, COMPREHENSIVE
ALT (SGPT): 60 U/L (ref 12–78)
AST (SGOT): 79 U/L — ABNORMAL HIGH (ref 15–37)
Albumin: 3.4 gm/dl (ref 3.4–5.0)
Alk. phosphatase: 216 U/L — ABNORMAL HIGH (ref 45–117)
BUN: 7 mg/dl (ref 7–25)
Bilirubin, total: 0.8 mg/dl (ref 0.2–1.0)
CO2: 29 mEq/L (ref 21–32)
Calcium: 8.7 mg/dl (ref 8.5–10.1)
Chloride: 96 mEq/L — ABNORMAL LOW (ref 98–107)
Creatinine: 0.6 mg/dl (ref 0.6–1.3)
GFR est AA: >60
GFR est non-AA: >60
Glucose: 107 mg/dl — ABNORMAL HIGH (ref 74–106)
Potassium: 4.4 mEq/L (ref 3.5–5.1)
Protein, total: 7.1 gm/dl (ref 6.4–8.2)
Sodium: 132 mEq/L — ABNORMAL LOW (ref 136–145)

## 2015-12-28 LAB — CBC WITH AUTOMATED DIFF
BASOPHILS: 0.4 % (ref 0–3)
EOSINOPHILS: 0.9 % (ref 0–5)
HCT: 34.2 % — ABNORMAL LOW (ref 37.0–50.0)
HGB: 10.5 gm/dl — ABNORMAL LOW (ref 13.0–17.2)
IMMATURE GRANULOCYTES: 0.3 % (ref 0.0–3.0)
LYMPHOCYTES: 14.8 % — ABNORMAL LOW (ref 28–48)
MCH: 24.4 pg — ABNORMAL LOW (ref 25.4–34.6)
MCHC: 30.7 gm/dl (ref 30.0–36.0)
MCV: 79.4 fL — ABNORMAL LOW (ref 80.0–98.0)
MONOCYTES: 8.5 % (ref 1–13)
MPV: 10.1 fL — ABNORMAL HIGH (ref 6.0–10.0)
NEUTROPHILS: 75.1 % — ABNORMAL HIGH (ref 34–64)
NRBC: 0 (ref 0–0)
PLATELET: 333 10*3/uL (ref 140–450)
RBC: 4.31 M/uL (ref 3.60–5.20)
RDW-SD: 56.2 — ABNORMAL HIGH (ref 36.4–46.3)
WBC: 6.8 10*3/uL (ref 4.0–11.0)

## 2015-12-28 LAB — NUCLEAR STRESS TEST
ECG Interp. During Exercise: NORMAL
Max. Diastolic BP: 86 mmHg
Max. Heart rate: 98 {beats}/min
Max. Systolic BP: 156 mmHg
Overall HR response to exercise: NORMAL
Peak Ex METs: 1 METS

## 2015-12-28 LAB — PROTHROMBIN TIME + INR
INR: 2.9 — ABNORMAL HIGH (ref 0.1–1.1)
Prothrombin time: 29.3 seconds — ABNORMAL HIGH (ref 11.5–14.0)

## 2015-12-28 LAB — GLUCOSE, POC
Glucose (POC): 136 mg/dL — ABNORMAL HIGH (ref 65–105)
Glucose (POC): 106 mg/dL — ABNORMAL HIGH (ref 65–105)
Glucose (POC): 95 mg/dL (ref 65–105)
Glucose (POC): 98 mg/dL (ref 65–105)

## 2015-12-28 LAB — MAGNESIUM: Magnesium: 2 mg/dl (ref 1.6–2.6)

## 2015-12-28 MED ORDER — SODIUM CHLORIDE 0.9 % IJ SYRG
Freq: Once | INTRAMUSCULAR | Status: AC
Start: 2015-12-28 — End: 2015-12-28
  Administered 2015-12-28: 15:00:00 via INTRAVENOUS

## 2015-12-28 MED ORDER — PROPOFOL 10 MG/ML IV EMUL
10 mg/mL | INTRAVENOUS | Status: DC | PRN
Start: 2015-12-28 — End: 2015-12-28
  Administered 2015-12-28: 21:00:00 via INTRAVENOUS

## 2015-12-28 MED ORDER — TECHNETIUM TC 99M SESTAMIBI - CARDIOLITE
Freq: Once | Status: AC
Start: 2015-12-28 — End: 2015-12-28
  Administered 2015-12-28: 13:00:00 via INTRAVENOUS

## 2015-12-28 MED ORDER — SODIUM CHLORIDE 0.9 % IV
100 mg iron/5 mL | INTRAVENOUS | Status: DC
Start: 2015-12-28 — End: 2015-12-29
  Administered 2015-12-28 – 2015-12-29 (×3): via INTRAVENOUS

## 2015-12-28 MED ORDER — IRON SUCROSE 100 MG/5 ML IV SOLN
100 mg iron/5 mL | Freq: Every day | INTRAVENOUS | Status: DC
Start: 2015-12-28 — End: 2015-12-28

## 2015-12-28 MED ORDER — SODIUM CHLORIDE 0.9 % IV
INTRAVENOUS | Status: DC | PRN
Start: 2015-12-28 — End: 2015-12-28
  Administered 2015-12-28: 21:00:00 via INTRAVENOUS

## 2015-12-28 MED ORDER — PANTOPRAZOLE 40 MG TAB, DELAYED RELEASE
40 mg | Freq: Every day | ORAL | Status: DC
Start: 2015-12-28 — End: 2015-12-29
  Administered 2015-12-29: 12:00:00 via ORAL

## 2015-12-28 MED ORDER — SUCRALFATE 100 MG/ML ORAL SUSP
100 mg/mL | Freq: Three times a day (TID) | ORAL | Status: DC
Start: 2015-12-28 — End: 2015-12-29
  Administered 2015-12-28 – 2015-12-29 (×2): via ORAL

## 2015-12-28 MED ORDER — REGADENOSON 0.4 MG/5 ML IV SYRINGE
0.4 mg/5 mL | Freq: Once | INTRAVENOUS | Status: AC
Start: 2015-12-28 — End: 2015-12-28
  Administered 2015-12-28: 15:00:00 via INTRAVENOUS

## 2015-12-28 MED FILL — TECHNETIUM TC 99M SESTAMIBI - CARDIOLITE: Qty: 10

## 2015-12-28 MED FILL — LEXISCAN 0.4 MG/5 ML INTRAVENOUS SYRINGE: 0.4 mg/5 mL | INTRAVENOUS | Qty: 5

## 2015-12-28 MED FILL — LAMOTRIGINE 100 MG TAB: 100 mg | ORAL | Qty: 1

## 2015-12-28 MED FILL — TECHNETIUM TC 99M SESTAMIBI - CARDIOLITE: Qty: 30

## 2015-12-28 MED FILL — HYDROMORPHONE (PF) 1 MG/ML IJ SOLN: 1 mg/mL | INTRAMUSCULAR | Qty: 1

## 2015-12-28 MED FILL — PROTONIX 40 MG INTRAVENOUS SOLUTION: 40 mg | INTRAVENOUS | Qty: 40

## 2015-12-28 MED FILL — CITALOPRAM 20 MG TAB: 20 mg | ORAL | Qty: 1

## 2015-12-28 MED FILL — BD POSIFLUSH NORMAL SALINE 0.9 % INJECTION SYRINGE: INTRAMUSCULAR | Qty: 10

## 2015-12-28 MED FILL — SODIUM CHLORIDE 0.9 % IV PIGGY BACK: INTRAVENOUS | Qty: 50

## 2015-12-28 MED FILL — PANTOPRAZOLE 40 MG TAB, DELAYED RELEASE: 40 mg | ORAL | Qty: 1

## 2015-12-28 MED FILL — ONDANSETRON (PF) 4 MG/2 ML INJECTION: 4 mg/2 mL | INTRAMUSCULAR | Qty: 2

## 2015-12-28 MED FILL — SIMVASTATIN 40 MG TAB: 40 mg | ORAL | Qty: 2

## 2015-12-28 MED FILL — SUCRALFATE 100 MG/ML ORAL SUSP: 100 mg/mL | ORAL | Qty: 10

## 2015-12-28 MED FILL — VENOFER 100 MG IRON/5 ML INTRAVENOUS SOLUTION: 100 mg iron/5 mL | INTRAVENOUS | Qty: 10

## 2015-12-28 NOTE — Procedures (Signed)
Procedures  by Franky Machoicketts, Shizue Kaseman A, MD at 12/28/15 1715                Author: Franky Machoicketts, Eman Morimoto A, MD  Service: Gastroenterology  Author Type: Physician       Filed: 12/28/15 1715  Date of Service: 12/28/15 1715  Status: Signed          Editor: Franky Machoicketts, Costas Sena A, MD (Physician)                       A member of [facility]            GI - post EGD note   (please see full procedure note for details)         ENDOSCOPIC DIAGNOSIS:   Bile gastritis   s/p Billroth II anatomy      RECOMMENDATIONS:   Treat pain symptomatically with PPI, carafate   Advance diet as tolerated         Dalia HeadingPaul Kapena Hamme, M.D.    [facility]    [place] Office: [phone]   [place] Office: [phone]

## 2015-12-28 NOTE — Procedures (Signed)
[facility]  [facility]  NAME:  [name], [name]  SEX:   F  DATE: 12/28/2015  DOB:   Aug 30, 1951  MR# [id number]  ROOM:    REFERRING:   BILLING#  [id number]  Jailah Willis MD        Read with radiology.     Nuclear perfusion images demonstrates uniform uptake of radiotracer seen in all myocardial vascular segments on both the stress perfusion as well as the resting perfusion images.  Gated SPECT shows normal wall motion with ejection fraction of 59% by gated SPECT.    OVERALL IMPRESSION:  1.  No evidence of myocardial scar or ischemia.  2.  Normal wall motion with ejection fraction of 59% by gated SPECT.  Dose of sestamibi is 10.3 mCi. Dose of sestamibi at stress was 32.9.      ___________________  Osa CraverMohit Denisa Enterline MD  Dictated By: .     D:12/28/2015 15:17:53  T: 12/28/2015 [id number]  [id number]

## 2015-12-28 NOTE — Progress Notes (Signed)
Problem: Pain  Goal: *Control of Pain  Outcome: Progressing Towards Goal  Monitored pt's pain level, for progression/relief. Administered PRN pain and nausea medicines. Instructed to call for assistance at all times. Hourly rounds. Will continue to monitor.

## 2015-12-28 NOTE — Progress Notes (Signed)
Problem: Falls - Risk of  Goal: *Absence of falls  Outcome: Progressing Towards Goal  Pt will call for assistance when getting out of bed to use the restroom. Bed alarm will be maintained

## 2015-12-28 NOTE — Progress Notes (Signed)
[facility]  Face to Face Encounter    Patient???s Name: [name] [name]           Date of Birth: 09/13/1950    Primary Diagnosis: symptomatic anemia, chest pain  Chest pain  Pneumoperitoneum  EPIGASTRIC PAIN                    Admit Date: 12/26/2015    Date of Face to Face:  December 28, 2015                    Medical Record Number: [id number]     Attending: Hazel Samsahir Ahmed, MD      Physician Attestation:  To be filled out by physician who conducted Face-to Face encounter.    Current Problem List:  The encounter with the patient was in whole, or in part, for the following medical condition, which is the primary reason home health care (list medical condition):    Patient Active Hospital Problem List:   Chest pain (05/19/2015)   Pneumoperitoneum (12/26/2015)   CAD    Anemia   H/o gastritis/esophagitis and PUD S/P partial gastrectomy  ??                          Face to Face Encounter findings are related to primary reason for home care:   yes.     1. I certify that the patient needs intermittent care as follows: skilled nursing care:  skilled observation/assessment, patient education, complex care plan management and administration of medications  physical therapy: strengthening, stretching/ROM, transfer training, gait/stair training, balance training and pt/caregiver education  occupational therapy:  ADL safety (ie. cooking, bathing, dressing), ROM and pt/caregiver education    2. I certify that this patient is homebound, that is: 1) patient requires the use of a walker device, special transportation, or assistance of another to leave the home; or 2) patient's condition makes leaving the home medically contraindicated; and 3) patient has a normal inability to leave the home and leaving the home requires considerable and taxing effort.  Patient may leave the home for infrequent and short duration for medical reasons, and occasional absences for non-medical reasons.  Homebound status is due to the following functional limitations: Patient with strength deficits limiting the performance of all ADL's without caregiver assistance or the use of an assistive device.  Patient with poor safety awareness and is at risk for falls without assistance of another person and the use of an assistive device.  Patient with poor ambulation endurance limiting their safe ability to ascend/descend the required number of steps to leave the home.    3. I certify that this patient is under my care and that I, or a nurse practitioner or physician???s assistant, or clinical nurse specialist, or certified nurse midwife, working with me, had a Face-to-Face Encounter that meets the physician Face-to-Face Encounter requirements.  The following are the clinical findings from the Face-to-Face encounter that support the need for skilled services and is a summary of the encounter:      See discharge summary    Electronically signed:  Inis SizerDonna S Bethea, RN  12/28/2015      THE FOLLOWING TO BE COMPLETED BY THE  PHYSICIAN:    I concur with the findings described above from the F2F encounter that this patient is homebound and in need of a skilled service.    Electronically signed:  Certifying Physician:

## 2015-12-28 NOTE — Procedures (Signed)
A member of [facility]        GI - post EGD note  (please see full procedure note for details)      ENDOSCOPIC DIAGNOSIS:  Bile gastritis  s/p Billroth II anatomy    RECOMMENDATIONS:  Treat pain symptomatically with PPI, carafate  Advance diet as tolerated      Dalia HeadingPaul Dashea Mcmullan, M.D.   [facility]   [facility] Office: [phone]  [place] Office: [phone]

## 2015-12-28 NOTE — Progress Notes (Incomplete)
OCCUPATIONAL THERAPY EVALUATION     Patient: [name] [name] (65 y.o. female)  Room: CD16/CD16    Date: 12/28/2015  Start Time:  ***        End Time:  ***    Primary Diagnosis: symptomatic anemia, chest pain  Chest pain  Pneumoperitoneum  EPIGASTRIC PAIN  Procedure(s) (LRB):  ESOPHAGOGASTRODUODENOSCOPY (EGD) (N/A)       Precautions: {OT Precautions:21508}.  Ordered Weight Bearing Status: {PT/OT Weight Bearing:22431}    Chart, occupational therapy assessment, plan of care, and goals were reviewed for [name] [name]. Discussed with patient's RN.     ASSESSMENT :  Based on the objective data described below, the patient presents with   {PT/OT PROBLEM XWRU:04540981}    Patient will benefit from skilled occupational therapy intervention to address the above impairments.     Patient???s rehabilitation potential is considered to be {Potential:18432}        PLAN :  Planned Interventions: {OT POC:21944}.  Frequency/Duration: Patient will be followed by occupational  therapy {OT Plan:21943} to address goals.  Discharge Recommendations: {OT Discharge:21946}.  Further Equipment Recommendations for Discharge: { :18436::"To be determined"}.     Please refer to Patient Education and Care Plan sections of chart for additional details.    EDUCATION:     {[facility] OT Education:22977}    SUBJECTIVE:     Patient ***.    OBJECTIVE DATA SUMMARY:     Patient was admitted to the hospital on 12/26/2015 with   Chief Complaint   Patient presents with   ??? Chest Pain     Present illness history:   Patient Active Problem List    Diagnosis Date Noted   ??? Pneumoperitoneum 12/26/2015   ??? Acute CVA (cerebrovascular accident) (HCC) 07/17/2015   ??? Chest pain 05/19/2015   ??? Gastrointestinal hemorrhage 01/01/2015   ??? GI bleed 11/26/2014   ??? Back pain, lumbosacral 09/11/2011   ??? Arthritis of knee 01/04/2011   ??? Encounter for long-term (current) use of other medications 01/04/2011   ??? Abdominal pain, generalized 01/04/2011    ??? 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 Lupus (HCC) 12/03/2010   ??? Osteoarthrosis, unspecified whether generalized or localized, unspecified site 12/03/2010   ??? Neuralgia, neuritis, and radiculitis, unspecified 12/03/2010   ??? Myalgia and myositis, unspecified 12/03/2010   ??? Pain in limb 12/03/2010   ??? Pain in joint, multiple sites 12/03/2010   ??? Type II or unspecified type diabetes mellitus without mention of complication, not stated as uncontrolled 12/03/2010   ??? Rib pain 10/10/2010      Past Medical history:   Past Medical History:   Diagnosis Date   ??? Acute hypercapnic respiratory failure (HCC) 02/05/2013    W/ Encephalopathy requiring BiPAP, Etiology Uncertain however Possible Narcotic Benzodiazipine Related?    ??? Adjustment disorder with mixed anxiety and depressed mood    ??? Aneurysm of internal carotid artery    ??? Arthritis of knee    ??? Asthma    ??? Autoimmune disease (HCC)    ??? CAD (coronary artery disease)    ??? Cervicalgia    ??? Chest pain 03/17/2014    Dobutamine Stress Echo: NORMAL WALL MOTION AND GLOBAL SYSTOLIC FUNCTION AT REST AND AFTER?? STRESS/EXERCISE WITH APPROPRIATE AUGMENTATION OF FUNCTION IN ALL SEGMENTS. NO EVIDENCE OF INDUCIBLE ISCHEMIA AT ADEQUATE HEART RATE WITH DOBUTAMINE STRESS.??            ??? Chronic low back pain 10/22/2014  [facility]: Moderate facet arthropathy at L4-L5 and L5-S1. Mild/moderate foraminal stenosis at these levels. Disc bulge with annular tear at L4-L5.    ??? Chronic pain syndrome    ??? Coccydynia    ??? Diabetes mellitus     NIDDM   ??? DVT (deep venous thrombosis) (HCC) 05/19/2007    [facility] CTA: 1. Small nonocclusive pulmonary embolus in a subsegmental branch to the left lower lobe. Larger filling defect in a segmental branch to the right lower lobe, not as low in attenuation as is normally seen with a pulmonary embolus, however is seen in multiple planes and remains  concerning for an additional nonocclusive pulmonary embolus.   ??? Fibromyalgia    ??? Gastric ulcer    ??? Gastritis 01/03/2015    [facility] Admission, EGD + H. Pylori/Gastritis   ??? Generalized osteoarthritis of multiple sites    ??? GI bleed 01/01/2015    [facility] Admit 5/2-01/07/2015   ??? H. pylori infection 01/03/2015    [facility] Admission, EGD + H. Pylori/Gastritis   ??? Hemiparesis, right (HCC)    ??? Hyperlipidemia    ??? Hypertension    ??? Irregular sleep-wake rhythm    ??? Knee joint pain    ??? Lumbar spondylosis     Orthopedic Spine Surgeon: Dr. Ree KidaJack L. Siegel    ??? Lupus (HCC)    ??? Muscle spasm    ??? Normocytic anemia     Chronic Iron Deficiency Anemia   ??? Osteoporosis    ??? Pancreatitis    ??? Peripheral neuropathy (HCC)    ??? Pulmonary embolism (HCC)     Multiple   ??? Seizure (HCC)    ??? Seizures (HCC)    ??? Sensory ataxia    ??? SLE (systemic lupus erythematosus) (HCC)    ??? Stroke [facility](HCC)    ??? Subclinical hypothyroidism    ??? Vitamin D deficiency        Patient found: {OT Patient Found:21953}.    Pain Assessment: {OT Pain Assessment:21954}  Pain Location:  ***    PRIOR LEVEL OF FUNCTION / LIVING SITUATION     {[facility] OT PLOF/Living Situation:22981}    COGNITIVE STATUS:     {[facility] OT Congnitive Status:22982}    ACTIVITIES OF DAILY LIVING STATUS:     Based on direct observation and clinical assessment.    {[facility] ADL??s:22885}    FUNCTIONAL MOBILITY and BALANCE STATUS:     Mobility:  {[facility] Mobility:22887}    Transfers:  {[facility] Transfers:22888}    Balance:  {[facility] Balance:22889}    Activity Tolerance: {OT Activity Tolerance:22022}.    UPPER EXTREMITY STATUS:     {[facility] OT Upper Extremity Status:22983}    Final position: {OT Final Location:22027}.    Thank you for this referral.  Jill SideJennifer M Kavar, OTR/L  Pager: ***      TREATMENT   Patient received / participated in *** minutes of treatment immediately following evaluation and/or educational instruction during evaluation.    OBJECTIVE: ***    ASSESSMENT: ***      PLAN: {OT RUEA:54098}Plan:21943}      Jill SideJennifer M Kavar, OTR/L

## 2015-12-28 NOTE — Progress Notes (Signed)
Progress Note    Patient: [name] [name]   Age:  65 y.o.  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 CAD s/p stent placement.   4. Malnutrition moderate.  5. H/o gastritis/esophagitis and PUD S/P partial gastrectomy   6. Hx PE/DVT on coumadin   7. UTI   8.Generalized fatigue.   Plan:   Patient is scheduled for stress test and upper GI endoscopy, further iron deficiency anemia patient be started on iron infusion, patient's chest pain is  Most likely consistent with costochondritis, she is very tender at the right lower parasternal area. If stress test and EGD is normal and patient feels better she can be discharged home in the morning    Subjective:   Complaining of  chest pain right lower pain point gets worse when she applies pressure on it, patient also gets worse with coughing, current medicines helping some, denies any fever denies any shortness of breath    Objective:     Visit Vitals   ??? BP 125/71 (BP 1 Location: Left arm, BP Patient Position: Supine)   ??? Pulse 86   ??? Temp 98.6 ??F (37 ??C)   ??? Resp 14   ??? Ht '5\' 7"'$  (1.702 m)   ??? Wt 63.2 kg (139 lb 5.3 oz)   ??? SpO2 97%   ??? Breastfeeding No   ??? BMI 21.82 kg/m2       Physical Exam:  General appearance: Middle-age female somewhat wasted looking sick  Head: Conjunctiva pale  Lungs: CTA bilaterally, tenderness right lower parasternal area  Heart: RRR, S1, S2 normal, no murmur,   Abdomen: soft, non-tender. Bowel sounds Audible,  Extremities: No cyanosis or edema  Skin: Skin color, texture, turgor normal.   Neurologic: Awake and alert    Intake and Output:  Current Shift:     Last three shifts:  04/27 0701 - 04/28 1900   In: 2170 [P.O.:120; I.V.:2050]  Out: 1950 [Urine:1950]    Lab/Data Reviewed:  Recent Results (from the past 48 hour(s))   CBC WITH AUTOMATED DIFF    Collection Time: 12/27/15  2:44 AM   Result Value Ref Range    WBC 7.8 4.0 - 11.0 1000/mm3    RBC 4.37 3.60 - 5.20 M/uL    HGB 10.9 (L) 13.0 - 17.2 gm/dl    HCT 34.4 (L) 37.0 - 50.0 %    MCV 78.7 (L) 80.0 - 98.0 fL    MCH 24.9 (L) 25.4 - 34.6 pg    MCHC 31.7 30.0 - 36.0 gm/dl    PLATELET 337 140 - 450 1000/mm3    MPV 10.3 (H) 6.0 - 10.0 fL    RDW-SD 56.0 (H) 36.4 - 46.3      NRBC 0 0 - 0      IMMATURE GRANULOCYTES 0.3 0.0 - 3.0 %    NEUTROPHILS 80.9 (H) 34 - 64 %    LYMPHOCYTES 11.3 (L) 28 - 48 %    MONOCYTES 6.6 1 - 13 %    EOSINOPHILS 0.5 0 - 5 %    BASOPHILS 0.4 0 - 3 %  MAGNESIUM    Collection Time: 12/27/15  2:44 AM   Result Value Ref Range    Magnesium 1.9 1.6 - 2.6 mg/dl   METABOLIC PANEL, COMPREHENSIVE    Collection Time: 12/27/15  2:44 AM   Result Value Ref Range    Sodium 133 (L) 136 - 145 mEq/L    Potassium 4.4 3.5 - 5.1 mEq/L    Chloride 98 98 - 107 mEq/L    CO2 27 21 - 32 mEq/L    Glucose 114 (H) 74 - 106 mg/dl    BUN 7 7 - 25 mg/dl    Creatinine 0.6 0.6 - 1.3 mg/dl    GFR est AA >60.0      GFR est non-AA >60      Calcium 8.7 8.5 - 10.1 mg/dl    AST (SGOT) 94 (H) 15 - 37 U/L    ALT (SGPT) 58 12 - 78 U/L    Alk. phosphatase 168 (H) 45 - 117 U/L    Bilirubin, total 0.4 0.2 - 1.0 mg/dl    Protein, total 7.1 6.4 - 8.2 gm/dl    Albumin 3.5 3.4 - 5.0 gm/dl   PROTHROMBIN TIME + INR    Collection Time: 12/27/15  2:44 AM   Result Value Ref Range    Prothrombin time 23.3 (H) 11.5 - 14.0 seconds    INR 2.1 (H) 0.1 - 1.1     TSH 3RD GENERATION    Collection Time: 12/27/15  2:44 AM   Result Value Ref Range    TSH 0.955 0.358 - 3.740 uIU/mL   GLUCOSE, POC    Collection Time: 12/27/15  8:25 AM   Result Value Ref Range    Glucose (POC) 103 65 - 105 mg/dL   GLUCOSE, POC    Collection Time: 12/27/15 11:48 AM   Result Value Ref Range    Glucose (POC) 119 (H) 65 - 105 mg/dL    GLUCOSE, POC    Collection Time: 12/27/15  5:18 PM   Result Value Ref Range    Glucose (POC) 134 (H) 65 - 105 mg/dL   GLUCOSE, POC    Collection Time: 12/27/15  8:22 PM   Result Value Ref Range    Glucose (POC) 136 (H) 65 - 105 mg/dL   PROTHROMBIN TIME + INR    Collection Time: 12/28/15  5:15 AM   Result Value Ref Range    Prothrombin time 29.3 (H) 11.5 - 14.0 seconds    INR 2.9 (H) 0.1 - 1.1     CBC WITH AUTOMATED DIFF    Collection Time: 12/28/15  5:15 AM   Result Value Ref Range    WBC 6.8 4.0 - 11.0 1000/mm3    RBC 4.31 3.60 - 5.20 M/uL    HGB 10.5 (L) 13.0 - 17.2 gm/dl    HCT 34.2 (L) 37.0 - 50.0 %    MCV 79.4 (L) 80.0 - 98.0 fL    MCH 24.4 (L) 25.4 - 34.6 pg    MCHC 30.7 30.0 - 36.0 gm/dl    PLATELET 333 140 - 450 1000/mm3    MPV 10.1 (H) 6.0 - 10.0 fL    RDW-SD 56.2 (H) 36.4 - 46.3      NRBC 0 0 - 0      IMMATURE GRANULOCYTES 0.3 0.0 - 3.0 %    NEUTROPHILS 75.1 (H) 34 - 64 %    LYMPHOCYTES 14.8 (L) 28 - 48 %    MONOCYTES 8.5 1 - 13 %  EOSINOPHILS 0.9 0 - 5 %    BASOPHILS 0.4 0 - 3 %   MAGNESIUM    Collection Time: 12/28/15  5:15 AM   Result Value Ref Range    Magnesium 2.0 1.6 - 2.6 mg/dl   METABOLIC PANEL, COMPREHENSIVE    Collection Time: 12/28/15  5:15 AM   Result Value Ref Range    Sodium 132 (L) 136 - 145 mEq/L    Potassium 4.4 3.5 - 5.1 mEq/L    Chloride 96 (L) 98 - 107 mEq/L    CO2 29 21 - 32 mEq/L    Glucose 107 (H) 74 - 106 mg/dl    BUN 7 7 - 25 mg/dl    Creatinine 0.6 0.6 - 1.3 mg/dl    GFR est AA >60.0      GFR est non-AA >60      Calcium 8.7 8.5 - 10.1 mg/dl    AST (SGOT) 79 (H) 15 - 37 U/L    ALT (SGPT) 60 12 - 78 U/L    Alk. phosphatase 216 (H) 45 - 117 U/L    Bilirubin, total 0.8 0.2 - 1.0 mg/dl    Protein, total 7.1 6.4 - 8.2 gm/dl    Albumin 3.4 3.4 - 5.0 gm/dl   GLUCOSE, POC    Collection Time: 12/28/15  7:47 AM   Result Value Ref Range    Glucose (POC) 106 (H) 65 - 105 mg/dL   NUCLEAR STRESS TEST    Collection Time: 12/28/15 10:29 AM   Result Value Ref Range    Diagnosis        Conclusion: Please Correlate with Nuclear Report:  Confirmed by Mayford Knife, M.D., [place] ([id number]) on 12/28/2015 1:40:51 PM      Test indication CHEST PAIN     Functional capacity      ECG Interp. Before Exercise Sinus Rhythm     ECG Interp. During Exercise Normal     Overall HR response to exercise Normal     Overall BP response to exercise Hypotensive     Max. Systolic BP 086 mmHg    Max. Diastolic BP 86 mmHg    Max. Heart rate 98 BPM    Duke treadmill score      Duke TM score result      Peak Ex METs 1.0 METS    Protocol name REGADENOSON          Known cardiac condition      Attending physician Janice Coffin , PA    GLUCOSE, POC    Collection Time: 12/28/15 12:24 PM   Result Value Ref Range    Glucose (POC) 95 65 - 105 mg/dL   GLUCOSE, POC    Collection Time: 12/28/15  6:02 PM   Result Value Ref Range    Glucose (POC) 98 65 - 105 mg/dL       Imaging:  Nm Cardiac Spect W Strs/rest Mult    Result Date: 12/28/2015  NUCLEAR MEDICINE LEXISCAN MYOCARDIAL PERFUSION STUDY:    INDICATION: Chronic cardiac origin chest pain TECHNIQUE: Baseline rest SPECT images were obtained following the intravenous administration of 10.3 mCi Tc79msestamibi.  The patient underwent pharmacologic stress. 0.4 mg of Lexiscan was infused over 20 seconds bolus. Immediately following the infusion, 32.9 mCi of Tc-972mestamibi was injected intravenously. After the Lexiscan infusion was completed, a series of gated SPECT images were obtained.  FINDINGS: There is essentially uniform distribution of isotope throughout the myocardium on the rest and stress images. Review of the  gated images demonstrate no wall motion abnormalities. The left ventricular ejection fraction is 59%.     IMPRESSION:  No reversible ischemia.  Co-read with cardiologist, Dr. Kellie Simmering.       Medications Reviewed:  Current Facility-Administered Medications   Medication Dose Route Frequency   ??? iron sucrose (VENOFER) 200 mg in 0.9% sodium chloride 100 mL IVPB  200  mg IntraVENous Q24H   ??? [START ON 12/29/2015] pantoprazole (PROTONIX) tablet 40 mg  40 mg Oral ACB   ??? sucralfate (CARAFATE) 100 mg/mL oral suspension 1 g  1 g Oral TIDAC   ??? cefTRIAXone (ROCEPHIN) 1 g in 0.9% sodium chloride (MBP/ADV) 50 mL MBP  1 g IntraVENous Q12H   ??? albuterol-ipratropium (DUO-NEB) 2.5 MG-0.5 MG/3 ML  3 mL Nebulization Q6H PRN   ??? ondansetron (ZOFRAN) injection 4 mg  4 mg IntraVENous Q4H PRN   ??? lamoTRIgine (LaMICtal) tablet 100 mg  100 mg Oral BID   ??? simvastatin (ZOCOR) tablet 80 mg  80 mg Oral QHS   ??? citalopram (CELEXA) tablet 20 mg  20 mg Oral DAILY   ??? sodium chloride (NS) flush 5-10 mL  5-10 mL IntraVENous Q8H   ??? sodium chloride (NS) flush 5-10 mL  5-10 mL IntraVENous PRN   ??? naloxone (NARCAN) injection 0.1 mg  0.1 mg IntraVENous PRN   ??? oxyCODONE IR (ROXICODONE) tablet 5 mg  5 mg Oral Q4H PRN   ??? albuterol CONCENTRATE 2.'5mg'$ /0.5 mL neb soln  2.5 mg Nebulization Q4H PRN   ??? *Warfarin Pharmacy to Dose  1 Each Other Rx Dosing/Monitoring   ??? 0.9% sodium chloride infusion 250 mL  250 mL IntraVENous PRN   ??? HYDROmorphone (PF) (DILAUDID) injection 1 mg  1 mg IntraVENous Q2H PRN   ??? 0.9% sodium chloride infusion  75 mL/hr IntraVENous CONTINUOUS         Dragon medical dictation software was used for portions of this report. Unintended errors may occur.   Elon Spanner, MD  P# [phone]  December 28, 2015

## 2015-12-28 NOTE — Anesthesia Pre-Procedure Evaluation (Signed)
Anesthetic History   No history of anesthetic complications            Review of Systems / Medical History  Patient summary reviewed, nursing notes reviewed and pertinent labs reviewed    Pulmonary            Asthma : well controlled       Neuro/Psych     seizures: well controlled  CVA: no residual symptoms  TIA     Cardiovascular    Hypertension: well controlled          CAD         GI/Hepatic/Renal           PUD     Endo/Other    Diabetes: well controlled, type 2  Hypothyroidism: well controlled  Arthritis     Other Findings              Physical Exam    Airway  Mallampati: II  TM Distance: 4 - 6 cm  Neck ROM: normal range of motion   Mouth opening: Normal     Cardiovascular  Regular rate and rhythm,  S1 and S2 normal,  no murmur, click, rub, or gallop             Dental    Dentition: Caps/crowns     Pulmonary  Breath sounds clear to auscultation               Abdominal  GI exam deferred       Other Findings            Anesthetic Plan    ASA: 3  Anesthesia type: MAC          Induction: Intravenous  Anesthetic plan and risks discussed with: Patient

## 2015-12-28 NOTE — Progress Notes (Signed)
1040 - pt was taken to cath lab for NM Stress Test.

## 2015-12-28 NOTE — Other (Deleted)
PLEASE DOCUMENT ANY ADDITIONAL DIAGNOSES AND/OR SPECIFICITY IN THE PROGRESS NOTES AND/OR DISCHARGE SUMMARY.    PHYSICIAN???S DOCUMENTATION REQUEST   This Form is Not a Permanent Document in the Medical Record     By submitting this query, we are merely seeking further clarification of documentation to accurately reflect all conditions that you are monitoring, evaluating, treating or that   extend the hospitalization or utilize additional resources of care. Please utilize your independent clinical judgment when addressing the question(s) below.      Dear [facility] Hospitalist,     ?? Please include if there is a Nutritional Diagnosis associated with the following:  ?? Mild, Moderate, or Severe Undernutrition  ?? Mild, Moderate, or Severe Protein Calorie Malnutrition  ?? Mild, Moderate, or Severe Malnutrition  ?? Other, please specify  ?? No Nutritional Diagnosis  ?? Unable to determine    ?? H/o gastritis/esophagitis and PUD S/P partial gastrectomy  ?? Chronic Anorexia  ?? Evaluated May 2016 for vomiting, epigastric pain, early satiety, and weight loss.  ?? Positive for H. pylori, chronic gastritis with focal hyperplastic features and mild reactive mucosal atypia.     o     Thank you,  Debby    Debby Cox BSN, RN, CCDS  Certified Clinical Documentation Specialist  [facility]   Cell: [phone]

## 2015-12-28 NOTE — Procedures (Signed)
[facility]  Nuclear Report  NAME:  Vanwingerden, Cheridan  SEX:   F  DATE: 12/28/2015  DOB:   05/03/1951  MR# [id number]  ROOM:    REFERRING:   BILLING#  [id number]  Lamira Borin MD        Read with radiology.     Nuclear perfusion images demonstrates uniform uptake of radiotracer seen in all myocardial vascular segments on both the stress perfusion as well as the resting perfusion images.  Gated SPECT shows normal wall motion with ejection fraction of 59% by gated SPECT.    OVERALL IMPRESSION:  1.  No evidence of myocardial scar or ischemia.  2.  Normal wall motion with ejection fraction of 59% by gated SPECT.  Dose of sestamibi is 10.3 mCi. Dose of sestamibi at stress was 32.9.      ___________________  Leyland Kenna MD  Dictated By: .     D:12/28/2015 15:17:53  T: 12/28/2015 20:43:29  [id number]

## 2015-12-28 NOTE — Progress Notes (Signed)
[facility]: WARFARIN Day 7     INR = 2.1 -> 2.9, likely still climbing  INR goal = 2 - 3 for hx DVT/PE  Drug interactions include Rocephin  Hold warfarin today, f/u INR in am     Shaaron AdlerAmber Bartlett, PharmD

## 2015-12-28 NOTE — Other (Signed)
Assumed care from Divina Mendoza,RN

## 2015-12-28 NOTE — Other (Signed)
Bedside and Verbal shift change report given to Lavella LemonsBarbara Baird,RN (oncoming nurse) by Ailene Ardsynthia Smith,RN (offgoing nurse). Report included the following information SBAR, Kardex, Intake/Output, MAR, Med Rec Status and Cardiac Rhythm ..Marland Kitchen

## 2015-12-28 NOTE — Anesthesia Post-Procedure Evaluation (Signed)
Post-Anesthesia Evaluation and Assessment    Patient: [name] [name] MRN: [id number]  SSN: [id number]    Date of Birth: 06/17/1951  Age: 65 y.o.  Sex: female       Cardiovascular Function/Vital Signs  Visit Vitals   ??? BP 142/85   ??? Pulse 82   ??? Temp 36.5 ??C (97.7 ??F)   ??? Resp 16   ??? Ht 5\' 7"  (1.702 m)   ??? Wt 63.2 kg (139 lb 5.3 oz)   ??? SpO2 96%   ??? Breastfeeding No   ??? BMI 21.82 kg/m2       Patient is status post MAC anesthesia for Procedure(s):  ESOPHAGOGASTRODUODENOSCOPY (EGD).    Nausea/Vomiting: None    Postoperative hydration reviewed and adequate.    Pain:  Pain Scale 1: Numeric (0 - 10) (12/28/15 1745)  Pain Intensity 1: 0 (12/28/15 1745)   Managed    Neurological Status:   Neuro (WDL): Within Defined Limits (12/28/15 1730)  Neuro  Neurologic State: Alert (12/28/15 0745)  Orientation Level: Oriented X4 (12/28/15 0745)  Cognition: Follows commands (12/28/15 0745)  Speech: Clear (12/28/15 0745)  Assessment L Pupil: Brisk;Round (12/27/15 2004)  Size L Pupil (mm): 3 (12/27/15 2004)  Assessment R Pupil: Brisk;Round (12/27/15 2004)  Size R Pupil (mm): 3 (12/27/15 2004)  LUE Motor Response: Purposeful (12/28/15 0745)  LLE Motor Response: Purposeful (12/28/15 0745)  RUE Motor Response: Purposeful (12/28/15 0745)  RLE Motor Response: Purposeful (12/28/15 0745)   At baseline    Mental Status and Level of Consciousness: Arousable    Pulmonary Status:   O2 Device: Room air (12/28/15 1730)   Adequate oxygenation and airway patent    Complications related to anesthesia: None    Post-anesthesia assessment completed. No concerns    Signed By: Janeece Ageeharles W Pau Banh, MD     December 28, 2015

## 2015-12-28 NOTE — Other (Signed)
Assumed care from Randy Padua, RN

## 2015-12-28 NOTE — Other (Signed)
TRANSFER - IN REPORT:   [name] CourseVeronica E [name]  being received from cd16 for ordered procedure        Information from the following report(s) Kardex, MAR and Recent Results was reviewed  Assessment completed upon patient???s arrival to unit and care assumed.

## 2015-12-29 LAB — GLUCOSE, POC
Glucose (POC): 121 mg/dL — ABNORMAL HIGH (ref 65–105)
Glucose (POC): 100 mg/dL (ref 65–105)

## 2015-12-29 LAB — PROTHROMBIN TIME + INR
INR: 2.7 — ABNORMAL HIGH (ref 0.1–1.1)
Prothrombin time: 28 seconds — ABNORMAL HIGH (ref 11.5–14.0)

## 2015-12-29 MED ORDER — METHYLPREDNISOLONE (PF) 125 MG/2 ML IJ SOLR
1252 mg/2 mL | INTRAMUSCULAR | Status: DC
Start: 2015-12-29 — End: 2015-12-29

## 2015-12-29 MED ORDER — WARFARIN 5 MG TAB
5 mg | Freq: Once | ORAL | Status: DC
Start: 2015-12-29 — End: 2015-12-29

## 2015-12-29 MED ORDER — ASCORBIC ACID 250 MG TAB
250 mg | ORAL_TABLET | Freq: Two times a day (BID) | ORAL | 3 refills | Status: AC
Start: 2015-12-29 — End: ?

## 2015-12-29 MED ORDER — SUCRALFATE 100 MG/ML ORAL SUSP
100 mg/mL | Freq: Three times a day (TID) | ORAL | 3 refills | Status: DC
Start: 2015-12-29 — End: 2016-07-14

## 2015-12-29 MED ORDER — PANTOPRAZOLE 40 MG TAB, DELAYED RELEASE
40 mg | ORAL_TABLET | Freq: Two times a day (BID) | ORAL | 3 refills | Status: DC
Start: 2015-12-29 — End: 2017-02-20

## 2015-12-29 MED ORDER — CEFUROXIME AXETIL 500 MG TAB
500 mg | ORAL_TABLET | Freq: Two times a day (BID) | ORAL | 0 refills | Status: DC
Start: 2015-12-29 — End: 2016-05-09

## 2015-12-29 MED ORDER — WARFARIN 5 MG TAB
5 mg | ORAL_TABLET | ORAL | 0 refills | Status: DC
Start: 2015-12-29 — End: 2016-07-14

## 2015-12-29 MED FILL — VENOFER 100 MG IRON/5 ML INTRAVENOUS SOLUTION: 100 mg iron/5 mL | INTRAVENOUS | Qty: 10

## 2015-12-29 MED FILL — LAMOTRIGINE 100 MG TAB: 100 mg | ORAL | Qty: 1

## 2015-12-29 MED FILL — PANTOPRAZOLE 40 MG TAB, DELAYED RELEASE: 40 mg | ORAL | Qty: 1

## 2015-12-29 MED FILL — CITALOPRAM 20 MG TAB: 20 mg | ORAL | Qty: 1

## 2015-12-29 MED FILL — HYDROMORPHONE (PF) 1 MG/ML IJ SOLN: 1 mg/mL | INTRAMUSCULAR | Qty: 1

## 2015-12-29 MED FILL — SUCRALFATE 100 MG/ML ORAL SUSP: 100 mg/mL | ORAL | Qty: 10

## 2015-12-29 MED FILL — SODIUM CHLORIDE 0.9 % IV PIGGY BACK: INTRAVENOUS | Qty: 50

## 2015-12-29 MED FILL — DIPRIVAN 10 MG/ML INTRAVENOUS EMULSION: 10 mg/mL | INTRAVENOUS | Qty: 30

## 2015-12-29 MED FILL — SIMVASTATIN 40 MG TAB: 40 mg | ORAL | Qty: 2

## 2015-12-29 MED FILL — DIPRIVAN 10 MG/ML INTRAVENOUS EMULSION: 10 mg/mL | INTRAVENOUS | Qty: 68.26

## 2015-12-29 NOTE — Progress Notes (Signed)
Problem: Pain  Goal: *Control of Pain  Outcome: Progressing Towards Goal  Pt aware of pain scale and pain medication available.

## 2015-12-29 NOTE — Progress Notes (Signed)
Spoke with Onalee Huaavid from [employer].   Paper work was sent incorrectly.  Cab will be arriving shortly to take patient home

## 2015-12-29 NOTE — Progress Notes (Signed)
Problem: Pain  Goal: *Control of Pain  Outcome: Progressing Towards Goal  Will be able to pharmacologically manage pain symptoms. Will continue to monitor and assess pain relief

## 2015-12-29 NOTE — Progress Notes (Signed)
Called [employer] to arrange cab home, reference [phone].  Pick up will be within three hours

## 2015-12-29 NOTE — Progress Notes (Signed)
Coumadin: INR = 2.7 (2.9 yesterday, goal is 2-3).  Coumadin 5mg  x 1 today; INR in am (daily).

## 2015-12-29 NOTE — Discharge Summary (Signed)
Discharge Summary    Patient: [name] [name]               Sex: female          DOA: 12/26/2015         Date of Birth:  [birth year]      Age:  65 y.o.        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 CAD s/p stent placement.   4. Malnutrition moderate.  5. H/o gastritis/esophagitis and PUD S/P partial gastrectomy   6. Hx PE/DVT on coumadin   7. UTI   8.Generalized fatigue.     Current Discharge Medication List      START taking these medications    Details   sucralfate (CARAFATE) 100 mg/mL suspension Take 10 mL by mouth Before breakfast, lunch, and dinner.  Qty: 414 mL, Refills: 3      pantoprazole (PROTONIX) 40 mg tablet Take 1 Tab by mouth two (2) times a day.  Qty: 60 Tab, Refills: 3      cefUROXime (CEFTIN) 500 mg tablet Take 1 Tab by mouth two (2) times a day.  Qty: 10 Tab, Refills: 0      ascorbic acid, vitamin C, (VITAMIN C) 250 mg tablet Take 1 Tab by mouth two (2) times a day. Take with iron pill  Qty: 60 Tab, Refills: 3         CONTINUE these medications which have CHANGED    Details   warfarin (COUMADIN) 5 mg tablet daily  Qty: 1 Tab, Refills: 0         CONTINUE these medications which have NOT CHANGED    Details   !! albuterol (PROVENTIL HFA, VENTOLIN HFA, PROAIR HFA) 90 mcg/actuation inhaler Take 2 Puffs by inhalation every four (4) hours as needed for Wheezing.  Qty: 1 Inhaler, Refills: 0      !! albuterol (PROVENTIL HFA) 90 mcg/actuation inhaler Take 1 Puff by inhalation as needed.  Qty: 1 Inhaler, Refills: 0      oxyCODONE IR (OXY-IR) 15 mg immediate release tablet Take 1 Tab by mouth four (4) times daily. Max Daily Amount: 60 mg.  Qty: 10 Tab, Refills: 0      citalopram (CELEXA) 20 mg tablet Take 20 mg by mouth daily.       cyclobenzaprine (FLEXERIL) 5 mg tablet Take 10 mg by mouth three (3) times daily as needed for Muscle Spasm(s).      simvastatin (ZOCOR) 80 mg tablet Take 80 mg by mouth nightly.      FLUoxetine (PROZAC) 40 mg capsule Take 1 Cap by mouth daily.  Qty: 30 Cap, Refills: 0      lamoTRIgine (LAMICTAL) 100 mg tablet Take 1 Tab by mouth two (2) times a day.  Qty: 60 Tab, Refills: 0      ferrous gluconate 324 mg (36 mg iron) tab Take 1 Tab by mouth Before breakfast and dinner.  Qty: 60 Tab, Refills: 2      alendronate-vitamin d3 70-2,800 mg-unit per tablet Take 1 Tab by mouth every seven (7) days.      amitriptyline (ELAVIL) 75 mg tablet  Take 1 Tab by mouth nightly.  Qty: 30 Tab, Refills: 1      albuterol-ipratropium (DUO-NEB) 0.5 mg-3 mg(2.5 mg base)/3 mL nebulizer solution 3 mL by Nebulization route once.      Cholecalciferol, Vitamin D3, (VITAMIN D) 1,000 unit Cap Take 50,000 Units by mouth every seven (7) days.       !! - Potential duplicate medications found. Please discuss with provider.      STOP taking these medications       predniSONE (DELTASONE) 20 mg tablet Comments:   Reason for Stopping:             Discharge Medications:     Current Discharge Medication List      START taking these medications    Details   sucralfate (CARAFATE) 100 mg/mL suspension Take 10 mL by mouth Before breakfast, lunch, and dinner.  Qty: 414 mL, Refills: 3      pantoprazole (PROTONIX) 40 mg tablet Take 1 Tab by mouth two (2) times a day.  Qty: 60 Tab, Refills: 3      cefUROXime (CEFTIN) 500 mg tablet Take 1 Tab by mouth two (2) times a day.  Qty: 10 Tab, Refills: 0      ascorbic acid, vitamin C, (VITAMIN C) 250 mg tablet Take 1 Tab by mouth two (2) times a day. Take with iron pill  Qty: 60 Tab, Refills: 3         CONTINUE these medications which have CHANGED    Details   warfarin (COUMADIN) 5 mg tablet daily  Qty: 1 Tab, Refills: 0         CONTINUE these medications which have NOT CHANGED    Details    !! albuterol (PROVENTIL HFA, VENTOLIN HFA, PROAIR HFA) 90 mcg/actuation inhaler Take 2 Puffs by inhalation every four (4) hours as needed for Wheezing.  Qty: 1 Inhaler, Refills: 0      !! albuterol (PROVENTIL HFA) 90 mcg/actuation inhaler Take 1 Puff by inhalation as needed.  Qty: 1 Inhaler, Refills: 0      oxyCODONE IR (OXY-IR) 15 mg immediate release tablet Take 1 Tab by mouth four (4) times daily. Max Daily Amount: 60 mg.  Qty: 10 Tab, Refills: 0      citalopram (CELEXA) 20 mg tablet Take 20 mg by mouth daily.      cyclobenzaprine (FLEXERIL) 5 mg tablet Take 10 mg by mouth three (3) times daily as needed for Muscle Spasm(s).      simvastatin (ZOCOR) 80 mg tablet Take 80 mg by mouth nightly.      FLUoxetine (PROZAC) 40 mg capsule Take 1 Cap by mouth daily.  Qty: 30 Cap, Refills: 0      lamoTRIgine (LAMICTAL) 100 mg tablet Take 1 Tab by mouth two (2) times a day.  Qty: 60 Tab, Refills: 0      ferrous gluconate 324 mg (36 mg iron) tab Take 1 Tab by mouth Before breakfast and dinner.  Qty: 60 Tab, Refills: 2      alendronate-vitamin d3 70-2,800 mg-unit per tablet Take 1 Tab by mouth every seven (7) days.      amitriptyline (ELAVIL) 75 mg tablet Take 1 Tab by mouth nightly.  Qty: 30 Tab, Refills: 1      albuterol-ipratropium (DUO-NEB) 0.5 mg-3 mg(2.5 mg base)/3 mL nebulizer solution 3 mL by Nebulization route once.      Cholecalciferol, Vitamin D3, (VITAMIN D) 1,000 unit Cap Take 50,000 Units by mouth every seven (7) days.       !! -  Potential duplicate medications found. Please discuss with provider.      STOP taking these medications       predniSONE (DELTASONE) 20 mg tablet Comments:   Reason for Stopping:               Follow-up: PCP in one week   GI in 2 weeks    Discharge Condition: Stable    Activity: As tolerated    Diet: Cardiac    Labs:  Labs: Results:   Chemistry Recent Labs      12/28/15   0515  12/27/15   0244   GLU  107*  114*   NA  132*  133*   K  4.4  4.4   CL  96*  98   CO2  29  27   BUN  7  7    CREA  0.6  0.6   CA  8.7  8.7   AP  216*  168*   TP  7.1  7.1   ALB  3.4  3.5      CBC w/Diff No results found for this visit on 12/26/15 (from the past 12 hour(s)).   Cardiac Enzymes Lab Results   Component Value Date/Time    CK 178 02/27/2015 01:05 AM    CK - MB 1.6 02/27/2015 01:05 AM    CK-MB Index 0.9 02/27/2015 01:05 AM    Troponin-I <0.015 12/26/2015 08:42 AM    Troponin-I, Qt. <0.02 02/27/2015 01:05 AM      Coagulation Lab Results   Component Value Date/Time    INR 2.7 12/29/2015 06:20 AM    INR 2.9 12/28/2015 05:15 AM    INR 2.1 12/27/2015 02:44 AM    Prothrombin time 28.0 12/29/2015 06:20 AM    Prothrombin time 29.3 12/28/2015 05:15 AM    Prothrombin time 23.3 12/27/2015 02:44 AM      Lipid Panel Lab Results   Component Value Date/Time    Cholesterol, total 171 07/18/2015 06:03 AM    HDL Cholesterol 70 07/18/2015 06:03 AM    LDL, calculated 93 07/18/2015 06:03 AM    Triglyceride 38 07/18/2015 06:03 AM    CHOL/HDL Ratio 2.4 07/18/2015 06:03 AM      BNP Lab Results   Component Value Date/Time    NT pro-BNP 170.6 12/26/2015 02:41 AM      Liver Enzymes Recent Labs      12/28/15   0515   TP  7.1   ALB  3.4   AP  216*   SGOT  79*      Vitamins No results found for: Conrad [place], [id number]    Lab Results   Component Value Date/Time    Folate 18.5 09/01/2014 08:23 PM      Thyroid Studies Lab Results   Component Value Date/Time    TSH 0.955 12/27/2015 02:44 AM          Imaging/Procedures:   Nm Cardiac Spect W Strs/rest Mult    Result Date: 12/28/2015  NUCLEAR MEDICINE LEXISCAN MYOCARDIAL PERFUSION STUDY:    INDICATION: Chronic cardiac origin chest pain TECHNIQUE: Baseline rest SPECT images were obtained following the intravenous administration of 10.3 mCi Tc80m sestamibi.  The patient underwent pharmacologic stress. 0.4 mg of Lexiscan was infused over 20 seconds bolus. Immediately following the infusion, 32.9 mCi of Tc-31m sestamibi was injected intravenously. After the  Lexiscan infusion was completed, a series of gated SPECT images were obtained.  FINDINGS: There is essentially uniform distribution of isotope  throughout the myocardium on the rest and stress images. Review of the gated images demonstrate no wall motion abnormalities. The left ventricular ejection fraction is 59%.     IMPRESSION:  No reversible ischemia.  Co-read with cardiologist, Dr. Osa Craver.     CT Results (most recent):    Results from Hospital Encounter encounter on 12/26/15   CT ABD PELV W CONT   Narrative EXAMINATION: CT ABD PELV W CONT    CLINICAL INDICATION: r/o pneumoperitoneum vs dilated loop of bowel w/abd pain  (see CXR)  abdominal pain    COMPARISON:  June 14, 2015    TECHNIQUE:  Multiple contiguous axial CT images at 5  mm increments were  obtained from the bases of the lungs to the  pubic symphysis with intravenous  contrast.    FINDINGS:  Intrahepatic and extra hepatic biliary duct dilatation. Centrally the hepatic  bile duct measures up to 18 mm. Common bile duct in the pancreatic neck measures  up to 1.8 cm similar to the prior exam. Pancreatic head is atrophic. Surgical  clips gallbladder fossa. Postop changes about the stomach.    Increasing bibasilar subpleural infiltrates. No free air. The graft marked  distention of the bladder. Appendix tentatively identified as a normal  structure.    No hydronephrosis. No renal masses.  Thoracolumbar scoliosis.         Impression IMPRESSION:     1. Persistent marked dilatation of the intrahepatic and extrahepatic biliary  ducts and pancreatic duct, similar to prior exam.  2. Marked distention of the bladder more prominent than prior exam. Patient  likely would benefit from Foley catheter.  3. Appendix tentatively identified as a normal structure.  4. No free air.        MRI Results (most recent):    Results from Hospital Encounter encounter on 07/17/15   MRI BRAIN W WO CONT   Narrative MRI OF THE BRAIN WITH AND WITHOUT IV CONTRAST     HISTORY: Bilateral weakness, fall with episode of confusion and slurred speech  which has resolved     TECHNIQUE:  Multiplanar, multisequence MR images of the brain pre and post IV gadolinium  based contrast.    COMPARISON:  CT 09/01/2014    FINDINGS:  Scattered periventricular and subcortical white matter foci of T2 and FLAIR  signal prolongation are nonspecific and similar to prior MRI. No associated  enhancement or restricted diffusion.    No abnormal intra or extra-axial fluid collections, midline shift, chronic  hemorrhage, hydrocephalus, or evidence for acute ischemia. No restricted  diffusion.     Possible developmental venous anomaly in the right medial parietal lobe appears  similar to prior. No other abnormal enhancement.     Bilateral mastoid effusions noted.         Impression IMPRESSION:  *  No acute intracranial process.  *  White matter changes similar in distribution to prior exam.   Possible developmental venous anomaly in the right medial parietal lobe similar  to prior exam.        XR Results (most recent):    Results from Hospital Encounter encounter on 12/26/15   XR ABD ACUTE W 1 V CHEST   Narrative Exam: Portable upright abdomen, supine abdomen    Clinical indication: Dyspnea    Comparison: None    Results:   Heart normal.  Mediastinal contours are normal.  No consolidation.   No pleural effusions.    Surgical clips in the upper abdomen. No free  air. No  dilated loops of bowel. Gas in the rectum. Nonspecific air-filled nondilated  loops of small bowel centrally. A few air-fluid levels in nondilated small  bowel.     No free air is seen under the hemidiaphragms.   Osseous structures intact.         Impression IMPRESSION:    1.  No free air.  2. Nonobstructive bowel gas pattern. No frank obstruction.        [place] Results (most recent):    Results from Hospital Encounter encounter on 04/06/15   [place] ABD LTD   Narrative History: Right upper quadrant pain     Sonographic evaluation of the right upper quadrant shows an echogenic liver.  There is cholecystectomy with common bile duct normal at 2 mm diameter.  Pancreas, aorta, IVC, right kidney normal.         Impression IMPRESSION: Fatty liver. Cholecystectomy without biliary dilatation or other  abnormality.        Results for orders placed or performed during the hospital encounter of 12/26/15   EKG, 12 LEAD, INITIAL   Result Value Ref Range    Ventricular Rate 85 BPM    Atrial Rate 85 BPM    P-R Interval 150 ms    QRS Duration 90 ms    Q-T Interval 342 ms    QTC Calculation (Bezet) 406 ms    Calculated P Axis 74 degrees    Calculated R Axis 42 degrees    Calculated T Axis 54 degrees    Diagnosis       Normal sinus rhythm  Possible Left atrial enlargement  When compared with ECG of 25-Sep-2015 09:03,  No significant change was found  Confirmed by Cleon Gustin, M.D., Madelynn Done. (30) on 12/26/2015 8:02:54 AM  Also confirmed by Cleon Gustin, M.D., Madelynn Done. (30)  on 12/26/2015 8:11:49 AM       EKG Results     Procedure 720 Value Units Date/Time    SCANNED CARDIAC RHYTHM STRIP [[id number]] Collected:  12/29/15 1020    Order Status:  Completed Updated:  12/29/15 1029    SCANNED CARDIAC RHYTHM STRIP [[id number]] Collected:  12/29/15 0910    Order Status:  Completed Updated:  12/29/15 0946    SCANNED CARDIAC RHYTHM STRIP [[id number]] Collected:  12/29/15 0134    Order Status:  Completed Updated:  12/29/15 0138    SCANNED CARDIAC RHYTHM STRIP [[id number]] Collected:  12/28/15 2148    Order Status:  Completed Updated:  12/28/15 2152    SCANNED CARDIAC RHYTHM STRIP [[id number]] Collected:  12/28/15 1415    Order Status:  Completed Updated:  12/28/15 1450    NUCLEAR STRESS TEST [[id number]] Collected:  12/28/15 1029    Order Status:  Completed Updated:  12/28/15 1341     Diagnosis --     Conclusion: Please Correlate with Nuclear Report:  Confirmed by Francisco Capuchin, M.D., Mohit ([id number]) on 12/28/2015 1:40:51 PM       Test indication CHEST PAIN      Functional capacity --     ECG Interp. Before Exercise Sinus Rhythm     ECG Interp. During Exercise Normal     Overall HR response to exercise Normal     Overall BP response to exercise Hypotensive     Max. Systolic BP 156 mmHg      Max. Diastolic BP 86 mmHg      Max. Heart rate 98 BPM      Duke treadmill score --     Duke TM  score result --     Peak Ex METs 1.0 METS      Protocol name REGADENOSON          Known cardiac condition --     Attending physician Jillyn Ledger , PA    SCANNED CARDIAC RHYTHM STRIP [[id number]] Collected:  12/28/15 0655    Order Status:  Completed Updated:  12/28/15 0733    SCANNED CARDIAC RHYTHM STRIP [[id number]] Collected:  12/28/15 0055    Order Status:  Completed Updated:  12/28/15 0058    SCANNED CARDIAC RHYTHM STRIP [[id number]] Collected:  12/27/15 2209    Order Status:  Completed Updated:  12/27/15 2223    SCANNED CARDIAC RHYTHM STRIP [[id number]] Collected:  12/27/15 1916    Order Status:  Completed Updated:  12/27/15 1926    SCANNED CARDIAC RHYTHM STRIP [[id number]] Collected:  12/27/15 1412    Order Status:  Completed Updated:  12/27/15 1450    SCANNED CARDIAC RHYTHM STRIP [[id number]] Collected:  12/27/15 1034    Order Status:  Completed Updated:  12/27/15 1127    SCANNED CARDIAC RHYTHM STRIP [[id number]] Collected:  12/27/15 0632    Order Status:  Completed Updated:  12/27/15 0809    SCANNED CARDIAC RHYTHM STRIP [[id number]] Collected:  12/27/15 0041    Order Status:  Completed Updated:  12/27/15 0043    SCANNED CARDIAC RHYTHM STRIP [[id number]] Collected:  12/26/15 2158    Order Status:  Completed Updated:  12/26/15 2218    SCANNED CARDIAC RHYTHM STRIP [[id number]] Collected:  12/26/15 1839    Order Status:  Completed Updated:  12/26/15 1850    SCANNED CARDIAC RHYTHM STRIP [[id number]] Collected:  12/26/15 1428    Order Status:  Completed Updated:  12/26/15 1439    SCANNED CARDIAC RHYTHM STRIP [[id number]] Collected:  12/26/15 1423     Order Status:  Completed Updated:  12/26/15 1437    SCANNED CARDIAC RHYTHM STRIP [[id number]] Collected:  12/26/15 1050    Order Status:  Completed Updated:  12/26/15 1113    EKG, 12 LEAD, INITIAL [[id number]] Collected:  12/26/15 0054    Order Status:  Completed Updated:  12/26/15 0811     Ventricular Rate 85 BPM      Atrial Rate 85 BPM      P-R Interval 150 ms      QRS Duration 90 ms      Q-T Interval 342 ms      QTC Calculation (Bezet) 406 ms      Calculated P Axis 74 degrees      Calculated R Axis 42 degrees      Calculated T Axis 54 degrees      Diagnosis --     Normal sinus rhythm  Possible Left atrial enlargement  When compared with ECG of 25-Sep-2015 09:03,  No significant change was found  Confirmed by Cleon Gustin, M.D., Madelynn Done. (30) on 12/26/2015 8:02:54 AM  Also confirmed by Cleon Gustin, M.D., Madelynn Done. (30)  on 12/26/2015 8:11:49 AM      SCANNED CARDIAC RHYTHM STRIP [[id number]] Collected:  12/26/15 6269    Order Status:  Completed Updated:  12/26/15 0802          Consults: GI and cardiology    Treatment Team: Treatment Team: Attending Provider: Hazel Sams, MD; Consulting Provider: Otilio Carpen, MD; Hospitalist: Elenor Quinones, MD; Care Manager: Earvin Hansen Shane Crutch, RN; Consulting Provider: Franky Macho, MD; Consulting Provider: Nelta Numbers, NP; Occupational Therapist: Jill Side, OT; Care Manager: Inis Sizer,  RN    Significant Diagnostic Studies: Stress test and upper GI endoscopy    Hospital Course:   Patient presented with chief complain of chest pain left-sided intermittent associated with some shortness of breath, patient was also complaining of some abdominal pain associated with nausea vomiting and diarrhea she denied any fever denies any hematemesis or rectal bleeding, patient was admitted to regular floor on telemetry patient had serial cardiac markers and EKGs done she was ruled out for MI ,cardiology and GI  consults were done on the patient had stress test done which showed no evidence of reversible ischemia, upper GI endoscopy showed some gastritis most likely bile induced no ulcers no active bleeding, during the hospital stay patient was given IV and infusion, on the day of discharge she was feeling little better still had some chest pain which gets worse with coughing and applying pressure over the right lower chest, denies any shortness of breath denies any fever denies nausea vomiting and requested to go home, physical examination on the day of discharge showed middle-aged female who is laying comfortably  Vitals stable, lungs clear, heart normal S1-S2 regular rhythm, patient has some tenderness  Right lower parasternal area, abdomen soft bowel sounds audible. Patient being discharged home with follow-up appointment with her primary care provider in a week, patient's advised to keep follow-up appointment with GI and cardiology, patient was advised to take her medicine as directed, patient was advised to have repeat CBC checked in the week, a copy of the discharge summary will be sent to patient's primary care provider. Patient was told if her condition gets worse she develops any new symptoms she can come back to the emergency room.    Dragon medical dictation software was used for portions of this report. Unintended errors may occur.     Hazel Samsahir Axyl Sitzman, MD  December 29, 2015        Total time spent: Over 30 min.

## 2015-12-29 NOTE — Other (Signed)
Assumed care from Barbara Baird, RN

## 2015-12-29 NOTE — Progress Notes (Signed)
Spoke to patient regarding her discharge instructions, verbalized understanding and answered her questions. She's in no distress right now, waiting for her ride.

## 2015-12-29 NOTE — Progress Notes (Signed)
Discharge Plan:  Home with home health    Discharge Date: 12/29/2015     TCC Referral: Yes    [facility] Health Agency:  [facility]    DME needed and ordered for Discharge:  No.  Oxygen sats on Room air 99 - 100%    DME Company:  NA    Delivery of DME Confirmed: NA    Confirmed start of care date with at the home health agency:  Yes with Amy at [phone]    Disposition discussed with the patient and or family and questions answered:  No (all arrangements made by Max Fickleonna B yesterday)    Mode of Transportation: Family

## 2016-01-01 LAB — CULTURE, BLOOD
Blood Culture Result: NO GROWTH
Blood Culture Result: NO GROWTH

## 2016-02-19 ENCOUNTER — Inpatient Hospital Stay
Admit: 2016-02-19 | Discharge: 2016-02-20 | Disposition: A | Discharge status: Home / Self Care | Payer: MEDICAID | Attending: Emergency Medicine

## 2016-02-19 ENCOUNTER — Emergency Department: Admit: 2016-02-19 | Payer: MEDICAID | Primary: Internal Medicine

## 2016-02-19 DIAGNOSIS — S9032XA Contusion of left foot, initial encounter: Secondary | ICD-10-CM

## 2016-02-19 NOTE — ED Notes (Signed)
9:18 PM  02/19/16     Discharge instructions given to patient  (name) with verbalization of understanding. Patient accompanied by daughter.  Patient discharged with the following prescriptions none. Patient discharged to home (destination).      Benetta SparVictoria D Lee-Priola

## 2016-02-19 NOTE — ED Provider Notes (Signed)
[facility]  Emergency Department Treatment Report    Patient: [name] [name] Age: 65 y.o. Sex: female    Date of Birth: 06/08/1951 Admit Date: 02/19/2016 PCP: Lewis Moccasin, MD   MRN: [id number]  CSN: [id number]     Room: H03/H03 Time Dictated: 8:47 PM        Chief Complaint   Left Foot injury  History of Present Illness   65 y.o. female Price to the emergency department complaining of left foot pain specifically the left second toe TV fell on it last night and today she woke up the pain was worse so she decided to come in to be evaluated she has had a fracture of that foot before    Review of Systems   Constitutional: No fever, chills, or weight loss  Musculoskeletal: No joint pain or swelling. Left foot pain  Integumentary: No rashes.  Neurological: No headaches, sensory or motor symptoms.  Denies complaints in all other systems.    Past Medical/Surgical History     Past Medical History:   Diagnosis Date   ??? Acute hypercapnic respiratory failure (HCC) 02/05/2013    W/ Encephalopathy requiring BiPAP, Etiology Uncertain however Possible Narcotic Benzodiazipine Related?    ??? Adjustment disorder with mixed anxiety and depressed mood    ??? Aneurysm of internal carotid artery    ??? Arthritis of knee    ??? Asthma    ??? Autoimmune disease (HCC)    ??? CAD (coronary artery disease)    ??? Cervicalgia    ??? Chest pain 03/17/2014    Dobutamine Stress Echo: NORMAL WALL MOTION AND GLOBAL SYSTOLIC FUNCTION AT REST AND AFTER?? STRESS/EXERCISE WITH APPROPRIATE AUGMENTATION OF FUNCTION IN ALL SEGMENTS. NO EVIDENCE OF INDUCIBLE ISCHEMIA AT ADEQUATE HEART RATE WITH DOBUTAMINE STRESS.??            ??? Chronic low back pain 10/22/2014    [facility] MRI Lumbar w/o contrast: Moderate facet arthropathy at L4-L5 and L5-S1. Mild/moderate foraminal stenosis at these levels. Disc bulge with annular tear at L4-L5.    ??? Chronic pain syndrome    ??? Coccydynia    ??? Diabetes mellitus     NIDDM   ??? DVT (deep venous thrombosis) (HCC) 05/19/2007     [facility] CTA: 1. Small nonocclusive pulmonary embolus in a subsegmental branch to the left lower lobe. Larger filling defect in a segmental branch to the right lower lobe, not as low in attenuation as is normally seen with a pulmonary embolus, however is seen in multiple planes and remains concerning for an additional nonocclusive pulmonary embolus.   ??? Fibromyalgia    ??? Gastric ulcer    ??? Gastritis 01/03/2015    [facility] Admission, EGD + H. Pylori/Gastritis   ??? Generalized osteoarthritis of multiple sites    ??? GI bleed 01/01/2015    [facility] Admit 5/2-01/07/2015   ??? H. pylori infection 01/03/2015    [facility] Admission, EGD + H. Pylori/Gastritis   ??? Hemiparesis, right (HCC)    ??? Hyperlipidemia    ??? Hypertension    ??? Irregular sleep-wake rhythm    ??? Knee joint pain    ??? Lumbar spondylosis     Orthopedic Spine Surgeon: Dr. Ree Kida L. Siegel    ??? Lupus (HCC)    ??? Muscle spasm    ??? Normocytic anemia     Chronic Iron Deficiency Anemia   ??? Osteoporosis    ??? Pancreatitis    ??? Peripheral neuropathy (HCC)    ??? Pulmonary  embolism (HCC)     Multiple   ??? Seizure (HCC)    ??? Seizures (HCC)    ??? Sensory ataxia    ??? SLE (systemic lupus erythematosus) (HCC)    ??? Stroke [facility](HCC)    ??? Subclinical hypothyroidism    ??? Vitamin D deficiency      Past Surgical History:   Procedure Laterality Date   ??? CARDIAC SURG PROCEDURE UNLIST      Cardiac Cath PCI w/ Stents   ??? HX CHOLECYSTECTOMY     ??? HX GI      Partial Gastrectomy    ??? HX GI  01/03/2015    [facility] EGD by Dr. Smitty CordsBruce D. Waldholtz: S/P B-2 Gastric Resection, Gastritis in small remnant, Bx of Jejunum r/o Celiac Disease and of Stomach. + Gastritis + H. Pylori   ??? HX HYSTERECTOMY         Social History     Social History     Social History   ??? Marital status: DIVORCED     Spouse name: N/A   ??? Number of children: N/A   ??? Years of education: N/A     Social History Main Topics   ??? Smoking status: Former Smoker   ??? Smokeless tobacco: Never Used   ??? Alcohol use No   ??? Drug use: No   ??? Sexual activity: Not Currently      Partners: Male     Other Topics Concern   ??? None     Social History Narrative       Family History     Family History   Problem Relation Age of Onset   ??? Diabetes Maternal Grandmother        Home Medications     Prior to Admission medications    Medication Sig Start Date End Date Taking? Authorizing Provider   sucralfate (CARAFATE) 100 mg/mL suspension Take 10 mL by mouth Before breakfast, lunch, and dinner. 12/29/15   Hazel Samsahir Ahmed, MD   warfarin (COUMADIN) 5 mg tablet daily 12/29/15   Hazel Samsahir Ahmed, MD   pantoprazole (PROTONIX) 40 mg tablet Take 1 Tab by mouth two (2) times a day. 12/29/15   Hazel Samsahir Ahmed, MD   cefUROXime (CEFTIN) 500 mg tablet Take 1 Tab by mouth two (2) times a day. 12/29/15   Hazel Samsahir Ahmed, MD   ascorbic acid, vitamin C, (VITAMIN C) 250 mg tablet Take 1 Tab by mouth two (2) times a day. Take with iron pill 12/29/15   Hazel Samsahir Ahmed, MD   albuterol (PROVENTIL HFA, VENTOLIN HFA, PROAIR HFA) 90 mcg/actuation inhaler Take 2 Puffs by inhalation every four (4) hours as needed for Wheezing. 09/25/15   Olin PiaMelanie M Vedder, NP   albuterol (PROVENTIL HFA) 90 mcg/actuation inhaler Take 1 Puff by inhalation as needed. 09/25/15   Olin PiaMelanie M Vedder, NP   oxyCODONE IR (OXY-IR) 15 mg immediate release tablet Take 1 Tab by mouth four (4) times daily. Max Daily Amount: 60 mg. 07/20/15   Faythe GheeLisa Huang, DO   citalopram (CELEXA) 20 mg tablet Take 20 mg by mouth daily.    Phys Other, MD   cyclobenzaprine (FLEXERIL) 5 mg tablet Take 10 mg by mouth three (3) times daily as needed for Muscle Spasm(s).    Phys Other, MD   simvastatin (ZOCOR) 80 mg tablet Take 80 mg by mouth nightly.    Phys Other, MD   FLUoxetine (PROZAC) 40 mg capsule Take 1 Cap by mouth daily. 01/07/15   Theodis AguasYared G Terefe, MD  lamoTRIgine (LAMICTAL) 100 mg tablet Take 1 Tab by mouth two (2) times a day. 01/07/15   Theodis Aguas, MD   ferrous gluconate 324 mg (36 mg iron) tab Take 1 Tab by mouth Before breakfast and dinner. 11/29/14   Kristie Cowman, MD    alendronate-vitamin d3 70-2,800 mg-unit per tablet Take 1 Tab by mouth every seven (7) days.    Phys Other, MD   amitriptyline (ELAVIL) 75 mg tablet Take 1 Tab by mouth nightly.  Patient taking differently: Take 50 mg by mouth nightly. 03/29/12   Sunshine J McWhinney, PA-C   albuterol-ipratropium (DUO-NEB) 0.5 mg-3 mg(2.5 mg base)/3 mL nebulizer solution 3 mL by Nebulization route once.    Historical Provider   Cholecalciferol, Vitamin D3, (VITAMIN D) 1,000 unit Cap Take 50,000 Units by mouth every seven (7) days.    Historical Provider       Allergies     Allergies   Allergen Reactions   ??? Tape [Adhesive] Itching     Paper tape      ??? Aspirin Not Reported This Time     Because of hx ulcers per patient   ??? Opana [Oxymorphone] Rash and Itching   ??? Tylenol [Acetaminophen] Itching       Physical Exam     Visit Vitals   ??? BP 124/82 (BP 1 Location: Left arm, BP Patient Position: Sitting)   ??? Pulse 92   ??? Temp 98 ??F (36.7 ??C)   ??? Resp 19   ??? Ht 5\' 7"  (1.702 m)   ??? Wt 64.9 kg (143 lb)   ??? SpO2 100%   ??? BMI 22.4 kg/m2     Constitutional: Patient appears well developed and well nourished. Marland Kitchen Appearance and behavior are age and situation appropriate.  Musculoskeletal: Nail beds pink with prompt capillary refill and numbness to the left second toe slightly swollen small abrasion dorsal aspect to flex and extended but painfullyrepresentative was a +2 capillary refill less than 3 seconds  Integumentary: warm and dry without rashes or lesions  Neurologic: alert and oriented, Sensation intact, motor strength equal and symmetric.      Impression and Management Plan   65 year old female arrives to the emergency department complaining of left second toe pain after a crush injury we'll get x-rays to rule out fracture or contusion  Diagnostic Studies   Lab:   No results found for this or any previous visit (from the past 12 hour(s)).    Imaging:    Xr Foot Lt Min 3 V    Result Date: 02/19/2016  Indication: Left anterior foot pain.      IMPRESSION: 3 views of the left foot reveal healed fracture of the distal shaft of the second metatarsal with mild deformity. Osteopenia is present diffusely. No acute fracture.     ED Course   Patient remained clinically stable throughout the Emergency Department visit.  Vitals were unremarkable and the patient's condition required no further ED intervention.  The patient is stable for outpatient management with appropriate symptomatic treatment, return precautions, and was advised of the importance of outpatient follow up for ongoing care.    Medical Decision Making   I explained to the patient and her family member that she had a crush injury only no fractures we will place her on a postop shoe to use for the next 4-5 days ice the area down elevate rest and Tylenol Motrin as needed for the pain  Final Diagnosis  ICD-10-CM ICD-9-CM   1. Contusion of left foot, initial encounter S90.32XA 924.20       Disposition   Home  Take Tylenol or Motrin for the pain  Follow-up with your primary care doctor    The patient was personally evaluated by myself and Dr. Henrene Hawking who agrees with the above assessment and plan      Hilaria Ota, PA  February 19, 2016    My signature above authenticates this document and my orders, the final ??  diagnosis (es), discharge prescription (s), and instructions in the Epic ??  record.  If you have any questions please contact [phone].  ??  Nursing notes have been reviewed by the physician/ advanced practice ??  Clinician.

## 2016-02-19 NOTE — ED Triage Notes (Signed)
TV stand fell over onto pts L foot. 2nd toe is swollen and painful.

## 2016-02-19 NOTE — ED Notes (Signed)
Patient states that she was moving her entertainment center and it was dropped on her left foot. She has pain and swelling in the second and third toe of the left foot. She has a small cut on the second toe. She states that she it happened 2 days ago and she has been compensating for the pain by walking on the heel but now the pain is too much.

## 2016-05-09 ENCOUNTER — Emergency Department: Admit: 2016-05-09 | Payer: MEDICAID | Primary: Internal Medicine

## 2016-05-09 ENCOUNTER — Inpatient Hospital Stay
Admit: 2016-05-09 | Discharge: 2016-05-09 | Disposition: A | Discharge status: Home / Self Care | Payer: MEDICAID | Attending: Emergency Medicine

## 2016-05-09 DIAGNOSIS — L03031 Cellulitis of right toe: Secondary | ICD-10-CM

## 2016-05-09 MED ORDER — CEPHALEXIN 500 MG CAP
500 mg | ORAL_CAPSULE | Freq: Four times a day (QID) | ORAL | 0 refills | Status: AC
Start: 2016-05-09 — End: 2016-05-16

## 2016-05-09 NOTE — ED Notes (Signed)
i have review discharge instruction and prescriptions with patient, patient verbalizes understanding

## 2016-05-09 NOTE — ED Provider Notes (Signed)
[facility]  [facility] Treatment [facility]        Patient: [name] CourseVeronica E [name] Age: 65 y.o. Sex: female    Date of Birth: 09/03/1950 Admit Date: 05/09/2016 PCP: Lynnae SandhoffJodi Newcombe, MD   MRN: [id number]  CSN: [id number]  Attending: Dr. Haze JustinLewis Siegel    Room: ER50/ER50 Time Dictated: 8:35 PM APP: Mel AlmondKate E Dezirae Service, PA-C     Chief Complaint   Chief Complaint   Patient presents with   ??? Foot Pain     right       History of Present Illness   65 y.o. female with multiple medical problems presents for evaluation of right great toe pain. 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 She reports she gets phantom pains to this toe intermittently however this pain is different. She states this pain started on June 17 after stepping on a tack at her home. She reports throbbing pain intermittently to the great right toe which she has noticed has gotten worse over the last week with some swelling. The swelling has since gone down however it is difficult for her to bear weight on the toe due to pain. She has chronic pain disorder and is treated at pain management. She denies fevers, nausea, vomiting. She has not noticed any ulcers, rash or redness to the toe.     Review of Systems   Review of Systems   Constitutional: Negative for chills, fever and malaise/fatigue.   HENT: Negative for ear pain, sinus pain and sore throat.    Eyes: Negative for blurred vision.   Respiratory: Negative for cough and shortness of breath.    Cardiovascular: Negative for chest pain, palpitations and leg swelling.   Gastrointestinal: Negative for abdominal pain, nausea and vomiting.   Genitourinary: Negative for dysuria.   Musculoskeletal:        Right great toe pain   Skin: Negative for rash.   Neurological: Negative for tingling, sensory change and focal weakness.       Past Medical/Surgical History     Past Medical History:   Diagnosis Date   ??? Acute hypercapnic respiratory failure (HCC) 02/05/2013     W/ Encephalopathy requiring BiPAP, Etiology Uncertain however Possible Narcotic Benzodiazipine Related?    ??? Adjustment disorder with mixed anxiety and depressed mood    ??? Aneurysm of internal carotid artery    ??? Arthritis of knee    ??? Asthma    ??? Autoimmune disease (HCC)    ??? CAD (coronary artery disease)    ??? Cervicalgia    ??? Chest pain 03/17/2014    Dobutamine Stress Echo: NORMAL WALL MOTION AND GLOBAL SYSTOLIC FUNCTION AT REST AND AFTER?? STRESS/EXERCISE WITH APPROPRIATE AUGMENTATION OF FUNCTION IN ALL SEGMENTS. NO EVIDENCE OF INDUCIBLE ISCHEMIA AT ADEQUATE HEART RATE WITH DOBUTAMINE STRESS.??            ??? Chronic low back pain 10/22/2014    [facility]: Moderate facet arthropathy at L4-L5 and L5-S1. Mild/moderate foraminal stenosis at these levels. Disc bulge with annular tear at L4-L5.    ??? Chronic pain syndrome    ??? Coccydynia    ??? Diabetes mellitus     NIDDM   ??? DVT (deep venous thrombosis) (HCC) 05/19/2007    [facility] CTA: 1. Small nonocclusive pulmonary embolus in a subsegmental branch to the left lower lobe. Larger filling defect in a segmental branch to the right lower lobe, not as low in  attenuation as is normally seen with a pulmonary embolus, however is seen in multiple planes and remains concerning for an additional nonocclusive pulmonary embolus.   ??? Fibromyalgia    ??? Gastric ulcer    ??? Gastritis 01/03/2015    [facility] Admission, EGD + H. Pylori/Gastritis   ??? Generalized osteoarthritis of multiple sites    ??? GI bleed 01/01/2015    [facility] Admit 5/2-01/07/2015   ??? H. pylori infection 01/03/2015    [facility] Admission, EGD + H. Pylori/Gastritis   ??? Hemiparesis, right (HCC)    ??? Hyperlipidemia    ??? Hypertension    ??? Irregular sleep-wake rhythm    ??? Knee joint pain    ??? Lumbar spondylosis     Orthopedic Spine Surgeon: Dr. Ree Kida L. Siegel    ??? Lupus (HCC)    ??? Muscle spasm    ??? Normocytic anemia     Chronic Iron Deficiency Anemia   ??? Osteoporosis    ??? Pancreatitis    ??? Peripheral neuropathy (HCC)     ??? Pulmonary embolism (HCC)     Multiple   ??? Seizure (HCC)    ??? Seizures (HCC)    ??? Sensory ataxia    ??? SLE (systemic lupus erythematosus) (HCC)    ??? Stroke [facility])    ??? Subclinical hypothyroidism    ??? Vitamin D deficiency         Past Surgical History:   Procedure Laterality Date   ??? CARDIAC SURG PROCEDURE UNLIST      Cardiac Cath PCI w/ Stents   ??? HX CHOLECYSTECTOMY     ??? HX GI      Partial Gastrectomy    ??? HX GI  01/03/2015    [facility] EGD by Dr. Smitty Cords D. Waldholtz: S/P B-2 Gastric Resection, Gastritis in small remnant, Bx of Jejunum r/o Celiac Disease and of Stomach. + Gastritis + H. Pylori   ??? HX HYSTERECTOMY         Social History     Social History     Social History   ??? Marital status: DIVORCED     Spouse name: N/A   ??? Number of children: N/A   ??? Years of education: N/A     Occupational History   ??? Not on file.     Social History Main Topics   ??? Smoking status: Former Smoker   ??? Smokeless tobacco: Never Used   ??? Alcohol use No   ??? Drug use: No   ??? Sexual activity: Not Currently     Partners: Male     Other Topics Concern   ??? Not on file     Social History Narrative       Family History     Family History   Problem Relation Age of Onset   ??? Diabetes Maternal Grandmother        Current Medications     (Not in a hospital admission)  Prior to Admission Medications   Prescriptions Last Dose Informant Patient Reported? Taking?   BUSPIRONE HCL (BUSPAR PO)   Yes Yes   Sig: Take  by mouth two (2) times a day.   Cholecalciferol, Vitamin D3, (VITAMIN D) 1,000 unit Cap   Yes Yes   Sig: Take 50,000 Units by mouth every seven (7) days.   FLUoxetine (PROZAC) 40 mg capsule   No Yes   Sig: Take 1 Cap by mouth daily.   albuterol (PROVENTIL HFA) 90 mcg/actuation inhaler   No Yes   Sig:  Take 1 Puff by inhalation as needed.   alendronate-vitamin d3 70-2,800 mg-unit per tablet   Yes No   Sig: Take 1 Tab by mouth every seven (7) days.   amitriptyline (ELAVIL) 75 mg tablet   No Yes   Sig: Take 1 Tab by mouth nightly.    Patient taking differently: Take 50 mg by mouth nightly.   ascorbic acid, vitamin C, (VITAMIN C) 250 mg tablet   No Yes   Sig: Take 1 Tab by mouth two (2) times a day. Take with iron pill   citalopram (CELEXA) 20 mg tablet   Yes Yes   Sig: Take 20 mg by mouth daily.   cyclobenzaprine (FLEXERIL) 5 mg tablet   Yes Yes   Sig: Take 10 mg by mouth three (3) times daily as needed for Muscle Spasm(s).   ferrous gluconate 324 mg (36 mg iron) tab   No Yes   Sig: Take 1 Tab by mouth Before breakfast and dinner.   lamoTRIgine (LAMICTAL) 100 mg tablet   No Yes   Sig: Take 1 Tab by mouth two (2) times a day.   oxyCODONE IR (OXY-IR) 15 mg immediate release tablet   No Yes   Sig: Take 1 Tab by mouth four (4) times daily. Max Daily Amount: 60 mg.   pantoprazole (PROTONIX) 40 mg tablet   No Yes   Sig: Take 1 Tab by mouth two (2) times a day.   simvastatin (ZOCOR) 80 mg tablet   Yes Yes   Sig: Take 80 mg by mouth nightly.   sucralfate (CARAFATE) 100 mg/mL suspension   No Yes   Sig: Take 10 mL by mouth Before breakfast, lunch, and dinner.   warfarin (COUMADIN) 5 mg tablet   No Yes   Sig: daily   Patient taking differently: 15 mg. daily  Indications: 15mg  on mwf other days of week are 10mg       Facility-Administered Medications: None     Allergies     Allergies   Allergen Reactions   ??? Tape [Adhesive] Itching     Paper tape      ??? Aspirin Not Reported This Time     Because of hx ulcers per patient   ??? Opana [Oxymorphone] Rash and Itching   ??? Tylenol [Acetaminophen] Itching       Physical Exam   ED Triage Vitals   Enc Vitals Group      BP 05/09/16 1759 143/75      Pulse (Heart Rate) 05/09/16 1759 95      Resp Rate 05/09/16 1759 18      Temp 05/09/16 1759 97.9 ??F (36.6 ??C)      Temp src --       O2 Sat (%) 05/09/16 1759 100 %      Weight 05/09/16 1759 133 lb      Height 05/09/16 1759 5' 7.5"     Physical Exam   Constitutional: She is oriented to person, place, and time.   Well-developed, well-nourished, nontoxic-appearing   HENT:    Head: Normocephalic and atraumatic.   Mouth/Throat: Oropharynx is clear and moist.   Eyes: Conjunctivae are normal.   Neck: Normal range of motion. Neck supple.   Cardiovascular: Normal rate, regular rhythm, normal heart sounds and intact distal pulses.  Exam reveals no gallop and no friction rub.    No murmur heard.  Pulmonary/Chest: Effort normal and breath sounds normal. No respiratory distress. She has no wheezes. She has no rales.  Abdominal: Soft. She exhibits no distension. There is no tenderness. There is no rebound and no guarding.   Musculoskeletal: Normal range of motion. She exhibits tenderness. She exhibits no edema or deformity.   Right great toe with amputation of the distal tip, diffusely tender to palpation, no swelling or erythema, no ulcer or fluctuance   Neurological: She is alert and oriented to person, place, and time.   Skin: Skin is warm and dry.   Vitals reviewed.       Impression and Management Plan   This is a 65 year old female with multiple medical problems who presents with pain in her right great toe, this toe is chronically painful to her secondary to her "phantom pains" s/p amputation of the distal tip at age 12. This new pain seems to be associated with an injury where she stepped on a tack. She believes her tetanus is up-to-date. We will obtain an x-ray of the foot to rule out osteomyelitis.  Diagnostic Studies   Lab:   No results found for this or any previous visit (from the past 12 hour(s)).  Labs Reviewed - No data to display    Imaging:    Xr Foot Rt Min 3 V    Result Date: 05/09/2016  Right foot x-ray AP, oblique, lateral view. INDICATION: Injury with pain. FINDINGS: Amputation distal portion of the distal phalanx of the great toe. No acute fractures.     IMPRESSION: As stated above.       ED [name]/Medical Decision Making   X-ray shows no acute fractures, no evidence of osteomyelitis. Her toe does not appear acutely infected however with her reported swelling and  exquisite tenderness of the area we will treat her for cellulitis and advise her to follow up with a podiatrist. We discussed that we can give her a cast shoe to take some of the pressure off of the joint and give her crutches to help her avoid weightbearing on the toe. Patient is in agreement of this and believe she will be able to use crutches effectively. She has an appointment with her pain management doctor tomorrow. Patient remained clinically stable throughout the Emergency Department visit.  Vitals were unremarkable and the patient's condition required no further ED intervention.  The patient is stable for outpatient management with appropriate symptomatic treatment, return precautions, and was advised of the importance of outpatient follow up for ongoing care.      Final Diagnosis       ICD-10-CM ICD-9-CM   1. Pain of toe of right foot M79.674 729.5   2. Cellulitis, unspecified cellulitis site L03.90 682.9       Disposition   Home with prescription for Keflex and follow-up with podiatry. Patient was given a shoe cast and crutches with instructions. She is to return to the ED for fever, worsening pain, redness or swelling or other concerns.    Fonnie Mu, PA-C  May 09, 2016  8:35 PM    The patient was personally evaluated by myself and Dr. Haze Justin found who agrees with the above assessment and plan.  Please note that portions of this note were completed with a voice recognition program. Efforts were made to edit the dictations but occasionally words are mis-transcribed.   My signature above authenticates this document and my orders, the final ??  diagnosis (es), discharge prescription (s), and instructions in the Epic ??  record.  If you have any questions please contact [phone].  ??  Nursing notes have been reviewed  by the physician/ advanced practice ??  Clinician.

## 2016-05-09 NOTE — ED Triage Notes (Signed)
Pt c/o right foot pain since June 17 after stepping a tack that was left by the cable service.  Pt has right great toe amputation noted.

## 2016-07-14 ENCOUNTER — Inpatient Hospital Stay
Admit: 2016-07-14 | Discharge: 2016-07-14 | Disposition: A | Discharge status: Home / Self Care | Payer: MEDICAID | Attending: Emergency Medicine

## 2016-07-14 ENCOUNTER — Emergency Department: Admit: 2016-07-14 | Payer: MEDICAID | Primary: Internal Medicine

## 2016-07-14 ENCOUNTER — Emergency Department

## 2016-07-14 DIAGNOSIS — M545 Low back pain: Secondary | ICD-10-CM

## 2016-07-14 LAB — TROPONIN I: Troponin-I: <0.015 ng/ml (ref 0.000–0.045)

## 2016-07-14 LAB — EKG, 12 LEAD, INITIAL
Atrial Rate: 95 {beats}/min
Calculated P Axis: 58 degrees
Calculated R Axis: 37 degrees
Calculated T Axis: 54 degrees
Diagnosis: NORMAL
P-R Interval: 136 ms
Q-T Interval: 330 ms
QRS Duration: 84 ms
QTC Calculation (Bezet): 414 ms
Ventricular Rate: 95 {beats}/min

## 2016-07-14 LAB — CBC WITH AUTOMATED DIFF
BASOPHILS: 0.7 % (ref 0–3)
EOSINOPHILS: 2.9 % (ref 0–5)
HCT: 34 % — ABNORMAL LOW (ref 37.0–50.0)
HGB: 10.5 gm/dl — ABNORMAL LOW (ref 13.0–17.2)
IMMATURE GRANULOCYTES: 0.2 % (ref 0.0–3.0)
LYMPHOCYTES: 30.7 % (ref 28–48)
MCH: 26.1 pg (ref 25.4–34.6)
MCHC: 30.9 gm/dl (ref 30.0–36.0)
MCV: 84.6 fL (ref 80.0–98.0)
MONOCYTES: 7.8 % (ref 1–13)
MPV: 11 fL — ABNORMAL HIGH (ref 6.0–10.0)
NEUTROPHILS: 57.7 % (ref 34–64)
NRBC: 0 (ref 0–0)
PLATELET: 245 10*3/uL (ref 140–450)
RBC: 4.02 M/uL (ref 3.60–5.20)
RDW-SD: 48.5 — ABNORMAL HIGH (ref 36.4–46.3)
WBC: 5.8 10*3/uL (ref 4.0–11.0)

## 2016-07-14 LAB — URINALYSIS W/ RFLX MICROSCOPIC
Bilirubin: NEGATIVE
Blood: NEGATIVE
Glucose: NEGATIVE mg/dl
Ketone: NEGATIVE mg/dl
Leukocyte Esterase: NEGATIVE
Nitrites: NEGATIVE
Protein: NEGATIVE mg/dl
Specific gravity: 1.02 (ref 1.005–1.030)
Urobilinogen: 0.2 mg/dl (ref 0.0–1.0)
pH (UA): 6 (ref 5.0–9.0)

## 2016-07-14 LAB — POC CHEM8
BUN: 9 mg/dl (ref 7–25)
CALCIUM,IONIZED: 4.7 mg/dL (ref 4.40–5.40)
CO2, TOTAL: 28 mmol/L (ref 21–32)
Chloride: 103 mEq/L (ref 98–107)
Creatinine: 0.7 mg/dl (ref 0.6–1.3)
Glucose: 87 mg/dL (ref 74–106)
HCT: 37 % — ABNORMAL LOW (ref 38–45)
HGB: 12.6 gm/dl (ref 12.4–17.2)
Potassium: 3.9 mEq/L (ref 3.5–4.9)
Sodium: 142 mEq/L (ref 136–145)

## 2016-07-14 LAB — GLUCOSE, POC: Glucose (POC): 88 mg/dL (ref 65–105)

## 2016-07-14 LAB — EKG 12-LEAD
Atrial Rate: 95 {beats}/min
Diagnosis: NORMAL
P Axis: 58 degrees
P-R Interval: 136 ms
Q-T Interval: 330 ms
QRS Duration: 84 ms
QTc Calculation (Bazett): 414 ms
R Axis: 37 degrees
T Axis: 54 degrees
Ventricular Rate: 95 {beats}/min

## 2016-07-14 MED ORDER — ONDANSETRON 4 MG TAB, RAPID DISSOLVE
4 mg | ORAL | Status: AC
Start: 2016-07-14 — End: 2016-07-14
  Administered 2016-07-14: 16:00:00 via ORAL

## 2016-07-14 MED ORDER — OXYCODONE 5 MG TAB
5 mg | ORAL | Status: AC
Start: 2016-07-14 — End: 2016-07-14
  Administered 2016-07-14: 16:00:00 via ORAL

## 2016-07-14 MED ORDER — PREDNISONE 20 MG TAB
20 mg | ORAL_TABLET | ORAL | 0 refills | Status: DC
Start: 2016-07-14 — End: 2017-02-20

## 2016-07-14 MED ORDER — PREDNISONE 20 MG TAB
20 mg | ORAL_TABLET | ORAL | 0 refills | Status: DC
Start: 2016-07-14 — End: 2016-07-14

## 2016-07-14 MED FILL — ONDANSETRON 4 MG TAB, RAPID DISSOLVE: 4 mg | ORAL | Qty: 1

## 2016-07-14 MED FILL — OXYCODONE 5 MG TAB: 5 mg | ORAL | Qty: 3

## 2016-07-14 NOTE — ED Provider Notes (Addendum)
[facility]  Emergency Department Treatment Report        Patient: [name] [name] Age: 65 y.o. Sex: female    Date of Birth: [birth year] Admit Date: 07/14/2016 PCP: Lynnae Sandhoff, MD   MRN: [id number]  CSN: [id number]     Room: ER14/ER14 Time Dictated: 9:57 AM      Chief Complaint   Chief Complaint   Patient presents with   ??? Back Pain       History of Present Illness   65 y.o. female with severe back pain. Reports frequent falls. Non bloody no bilious emesis lat night x1 and this am x 1.  Also endorses chest pain. Still ambulatory. Pt has hx of VTE on Eliquis, Hx of CVA. Reports seizure disorder. Endorses oxicodone use for pain. Asking for pain meds, states that she can not take APAP or NSAIDs. She is perseverating on her pain and not really responding to other questions.      Review of Systems   Review of Systems   Constitutional: Positive for malaise/fatigue. Negative for chills and fever.   HENT:        Right face pain   Eyes: Negative for blurred vision and photophobia.   Respiratory: Negative for cough and shortness of breath.    Cardiovascular: Positive for chest pain. Negative for palpitations.   Gastrointestinal: Positive for nausea and vomiting. Negative for abdominal pain, blood in stool, diarrhea and melena.   Genitourinary: Negative for dysuria, flank pain and hematuria.   Musculoskeletal: Positive for back pain, falls, myalgias and neck pain.   Skin: Negative for rash.   Neurological: Positive for dizziness (light headed), tremors and weakness. Negative for speech change, focal weakness, seizures and headaches.       Past Medical/Surgical History     Past Medical History:   Diagnosis Date   ??? Acute hypercapnic respiratory failure (HCC) 02/05/2013    W/ Encephalopathy requiring BiPAP, Etiology Uncertain however Possible Narcotic Benzodiazipine Related?    ??? Adjustment disorder with mixed anxiety and depressed mood    ??? Aneurysm of internal carotid artery    ??? Arthritis of knee     ??? Asthma    ??? Autoimmune disease (HCC)    ??? CAD (coronary artery disease)    ??? Cervicalgia    ??? Chest pain 03/17/2014    Dobutamine Stress Echo: NORMAL WALL MOTION AND GLOBAL SYSTOLIC FUNCTION AT REST AND AFTER?? STRESS/EXERCISE WITH APPROPRIATE AUGMENTATION OF FUNCTION IN ALL SEGMENTS. NO EVIDENCE OF INDUCIBLE ISCHEMIA AT ADEQUATE HEART RATE WITH DOBUTAMINE STRESS.??            ??? Chronic low back pain 10/22/2014    [facility] MRI Lumbar w/o contrast: Moderate facet arthropathy at L4-L5 and L5-S1. Mild/moderate foraminal stenosis at these levels. Disc bulge with annular tear at L4-L5.    ??? Chronic pain syndrome    ??? Coccydynia    ??? Diabetes mellitus     NIDDM   ??? DVT (deep venous thrombosis) (HCC) 05/19/2007    [facility] CTA: 1. Small nonocclusive pulmonary embolus in a subsegmental branch to the left lower lobe. Larger filling defect in a segmental branch to the right lower lobe, not as low in attenuation as is normally seen with a pulmonary embolus, however is seen in multiple planes and remains concerning for an additional nonocclusive pulmonary embolus.   ??? Fibromyalgia    ??? Gastric ulcer    ??? Gastritis 01/03/2015    [facility] Admission, EGD + H. Pylori/Gastritis   ???  Generalized osteoarthritis of multiple sites    ??? GI bleed 01/01/2015    [facility] Admit 5/2-01/07/2015   ??? H. pylori infection 01/03/2015    [facility] Admission, EGD + H. Pylori/Gastritis   ??? Hemiparesis, right (HCC)    ??? Hyperlipidemia    ??? Hypertension    ??? Irregular sleep-wake rhythm    ??? Knee joint pain    ??? Lumbar spondylosis     Orthopedic Spine Surgeon: Dr. Ree Kida L. Siegel    ??? Lupus    ??? Muscle spasm    ??? Normocytic anemia     Chronic Iron Deficiency Anemia   ??? Osteoporosis    ??? Pancreatitis    ??? Peripheral neuropathy    ??? Pulmonary embolism (HCC)     Multiple   ??? Seizure (HCC)    ??? Seizures (HCC)    ??? Sensory ataxia    ??? SLE (systemic lupus erythematosus) (HCC)    ??? Stroke [facility])    ??? Subclinical hypothyroidism    ??? Vitamin D deficiency      Past Surgical History:    Procedure Laterality Date   ??? CARDIAC SURG PROCEDURE UNLIST      Cardiac Cath PCI w/ Stents   ??? HX CHOLECYSTECTOMY     ??? HX GI      Partial Gastrectomy    ??? HX GI  01/03/2015    [facility] EGD by Dr. Smitty Cords D. Waldholtz: S/P B-2 Gastric Resection, Gastritis in small remnant, Bx of Jejunum r/o Celiac Disease and of Stomach. + Gastritis + H. Pylori   ??? HX HYSTERECTOMY         Social History     Social History     Social History   ??? Marital status: DIVORCED     Spouse name: N/A   ??? Number of children: N/A   ??? Years of education: N/A     Occupational History   ??? Not on file.     Social History Main Topics   ??? Smoking status: Former Smoker   ??? Smokeless tobacco: Never Used   ??? Alcohol use No   ??? Drug use: No   ??? Sexual activity: Not Currently     Partners: Male     Other Topics Concern   ??? Not on file     Social History Narrative       Family History     Family History   Problem Relation Age of Onset   ??? Diabetes Maternal Grandmother        Current Medications     Prior to Admission Medications   Prescriptions Last Dose Informant Patient Reported? Taking?   APIXABAN (ELIQUIS PO)   Yes Yes   Sig: Take  by mouth.   BUSPIRONE HCL (BUSPAR PO)   Yes No   Sig: Take  by mouth two (2) times a day.   Cholecalciferol, Vitamin D3, (VITAMIN D) 1,000 unit Cap   Yes No   Sig: Take 50,000 Units by mouth every seven (7) days.   FLUoxetine (PROZAC) 40 mg capsule   No No   Sig: Take 1 Cap by mouth daily.   albuterol (PROVENTIL HFA) 90 mcg/actuation inhaler   No No   Sig: Take 1 Puff by inhalation as needed.   alendronate-vitamin d3 70-2,800 mg-unit per tablet   Yes No   Sig: Take 1 Tab by mouth every seven (7) days.   amitriptyline (ELAVIL) 75 mg tablet   No No   Sig: Take 1  Tab by mouth nightly.   Patient taking differently: Take 50 mg by mouth nightly.   ascorbic acid, vitamin C, (VITAMIN C) 250 mg tablet   No No   Sig: Take 1 Tab by mouth two (2) times a day. Take with iron pill   citalopram (CELEXA) 20 mg tablet   Yes No    Sig: Take 20 mg by mouth daily.   cyclobenzaprine (FLEXERIL) 5 mg tablet   Yes No   Sig: Take 10 mg by mouth three (3) times daily as needed for Muscle Spasm(s).   ferrous gluconate 324 mg (36 mg iron) tab   No No   Sig: Take 1 Tab by mouth Before breakfast and dinner.   lamoTRIgine (LAMICTAL) 100 mg tablet   No No   Sig: Take 1 Tab by mouth two (2) times a day.   oxyCODONE IR (OXY-IR) 15 mg immediate release tablet   No No   Sig: Take 1 Tab by mouth four (4) times daily. Max Daily Amount: 60 mg.   pantoprazole (PROTONIX) 40 mg tablet   No No   Sig: Take 1 Tab by mouth two (2) times a day.   simvastatin (ZOCOR) 80 mg tablet   Yes No   Sig: Take 80 mg by mouth nightly.   sucralfate (CARAFATE) 100 mg/mL suspension   No No   Sig: Take 10 mL by mouth Before breakfast, lunch, and dinner.   warfarin (COUMADIN) 5 mg tablet   No No   Sig: daily   Patient taking differently: 15 mg. daily  Indications: 15mg  on mwf other days of week are 10mg       Facility-Administered Medications: None       Allergies     Allergies   Allergen Reactions   ??? Tape [Adhesive] Itching     Paper tape      ??? Aspirin Not Reported This Time     Because of hx ulcers per patient   ??? Opana [Oxymorphone] Rash and Itching   ??? Tylenol [Acetaminophen] Itching       Physical Exam   ED Triage Vitals   Enc Vitals Group      BP 07/14/16 0955 141/75      Pulse (Heart Rate) 07/14/16 0955 91      Resp Rate 07/14/16 0955 16      Temp 07/14/16 0955 98.6 ??F (37 ??C)      Temp src --       O2 Sat (%) 07/14/16 0955 99 %      Weight --       Height --       Head Cir --       Peak Flow --       Pain Score --       Pain Loc --       Pain Edu? --       Excl. in GC? --      Physical Exam   Constitutional: She is oriented to person, place, and time. She appears distressed (tearful).   HENT:   Head: Normocephalic and atraumatic.   Mouth/Throat: Oropharynx is clear and moist.   Eyes: EOM are normal. Pupils are equal, round, and reactive to light. No scleral icterus.    Pupils miotic   Neck: Normal range of motion. Neck supple.   Exquisite tenderness to palpation   Cardiovascular: Normal rate, regular rhythm, normal heart sounds and intact distal pulses.    Pulmonary/Chest: Effort normal and breath sounds  normal. She exhibits tenderness (anterior).   Abdominal: Soft. She exhibits no distension. There is no tenderness.   Musculoskeletal: Normal range of motion. She exhibits no edema, tenderness or deformity.        Arms:  Noted right great toe amputation   Neurological: She is alert and oriented to person, place, and time. GCS score is 15.   Exam limited by patient compliance   Skin: Skin is warm and dry. No rash noted.     Diagnostic Studies   Lab:   Recent Results (from the past 12 hour(s))   CBC WITH AUTOMATED DIFF    Collection Time: 07/14/16 10:23 AM   Result Value Ref Range    WBC 5.8 4.0 - 11.0 1000/mm3    RBC 4.02 3.60 - 5.20 M/uL    HGB 10.5 (L) 13.0 - 17.2 gm/dl    HCT 16.134.0 (L) 09.637.0 - 50.0 %    MCV 84.6 80.0 - 98.0 fL    MCH 26.1 25.4 - 34.6 pg    MCHC 30.9 30.0 - 36.0 gm/dl    PLATELET 045245 409140 - 811450 1000/mm3    MPV 11.0 (H) 6.0 - 10.0 fL    RDW-SD 48.5 (H) 36.4 - 46.3      NRBC 0 0 - 0      IMMATURE GRANULOCYTES 0.2 0.0 - 3.0 %    NEUTROPHILS 57.7 34 - 64 %    LYMPHOCYTES 30.7 28 - 48 %    MONOCYTES 7.8 1 - 13 %    EOSINOPHILS 2.9 0 - 5 %    BASOPHILS 0.7 0 - 3 %   EKG, 12 LEAD, INITIAL    Collection Time: 07/14/16 10:28 AM   Result Value Ref Range    Ventricular Rate 95 BPM    Atrial Rate 95 BPM    P-R Interval 136 ms    QRS Duration 84 ms    Q-T Interval 330 ms    QTC Calculation (Bezet) 414 ms    Calculated P Axis 58 degrees    Calculated R Axis 37 degrees    Calculated T Axis 54 degrees    Diagnosis       Normal sinus rhythm  Possible Left atrial enlargement  Nonspecific T wave abnormality Anteroseptal leads  When compared with ECG of 26-Dec-2015 00:54,  T wave inversion now evident in Anterior leads   Confirmed by Cleon GustinAshby, M.D., Madelynn Doneharles Jr. ([id number]) on 07/14/2016 11:30:22 AM     GLUCOSE, POC    Collection Time: 07/14/16 10:43 AM   Result Value Ref Range    Glucose (POC) 88 65 - 105 mg/dL     Labs Reviewed   CBC WITH AUTOMATED DIFF - Abnormal; Notable for the following:        Result Value    HGB 10.5 (*)     HCT 34.0 (*)     MPV 11.0 (*)     RDW-SD 48.5 (*)     All other components within normal limits   URINALYSIS W/ RFLX MICROSCOPIC   TROPONIN I   GLUCOSE, POC   GLUCOSE, POC   POC CHEM8       Imaging:    Xr Chest Sngl V    Result Date: 07/14/2016  Clinical history: Chest and back pain EXAMINATION: Single view of the chest 07/14/2016 FINDINGS: Trachea and heart size are within normal limits. Lungs are clear.     IMPRESSION: No acute pulmonary process.     POC Abdominal aorta US: normal  caliber abdominal aorta, no evidence of saccular aneurism.     Impression and Plan/ED Course     Pt states that she has ulcers and can not take NSAIDs, and has allergy to APAP. Offered her home oxycodone dose as Rx'd by her Pain Management doctor and zofran for nausea.   EKG unremarkable. Glucose normal.   Pain improved after PO pain meds.   Now endorsing chest pain. Trop ordered.       Medical Decision Making     Bedside US  Date/Time: 07/14/2016 12:08 PM  Consent: Verbal consent obtained.  Consent given by: patient  Supervising provider: Dr. Jimmey Ralph  Indication: back pain  Images Obtained: Longitudinal plane of the abdominal aorta and Transverse plane of the abdominal aorta from the level of the celiac axis to the bifurcation of the distal aorta  Findings: Normal study.  Confirmation Study: Not Indicated  Comments: Images digitally saved on the ultrasound machine        DDX: Back spasm,  AAA, pyelonephitis, other.   Labs and UA unremarkable. Imaging neg.  Considered PE, but currently anticoagulated without clinical findings consistent with VTE. EKG and neg trop work against cardiac cause.      Pain improved. Likely exacerbation of chronic MSK pain. Stable for discharge to home with steroid taper and pain management follow up.     Final Diagnosis       ICD-10-CM ICD-9-CM   1. Back pain, lumbosacral M54.5 724.2     724.6   2. Chest pain, unspecified type R07.9 786.50   3. Neck pain M54.2 723.1       Disposition   Discharged to home      The patient was personally evaluated by myself and Dr. Jimmey Ralph who agrees with the above assessment and plan.    Barnie Del, MD  [facility]  July 14, 2016     Continued ED Course by Dr Floyce Stakes       ATTENDING NOTE BY DR Orlen Leedy Jimmey Ralph:    I, Johny Drilling, MD, have personally seen, interviewed, and examined this patient independently; I have fully participated in the care of this patient with the resident.  I have reviewed and agree with all pertinent clinical information including history, physical exam, labs, radiographic studies and the plan.  I have also reviewed and agree with the medications, allergies and past medical history sections for this patient.    I discussed with the resident and agree with their evaluation and plan as documented here.  I was physically present for all procedures.      Continuation of the note by Dr Rito Ehrlich.  Back pain without evidence of sciatica or any back pain red flags. We performed a bedside ultrasound to make sure that this is not an abdominal aortic aneurysm and the bedside ultrasound was unremarkable. She is also having some indeterminate chest pain but her workup was unremarkable. At this time she is safe for outpatient management and there is no evidence of emergent pathology, she is encouraged to follow up within the next couple of days with her primary care and return to the ER for any change, worsening, or other concerns     Final Diagnosis       ICD-10-CM ICD-9-CM   1. Back pain, lumbosacral M54.5 724.2     724.6   2. Chest pain, unspecified type R07.9 786.50   3. Neck pain M54.2 723.1    4. Chronic bilateral low back pain without sciatica M54.5 724.2  G89.29 338.29         Electronically signed by Johny Drilling, MD      My signature above authenticates this document and my orders, the final ??  diagnosis (es), discharge prescription (s), and instructions in the Epic ??  record.  ??  Nursing notes have been reviewed by the physician/ advanced practice ??  Clinician.

## 2016-07-14 NOTE — ED Triage Notes (Signed)
Woke up with back pain muscle pain

## 2016-07-18 MED FILL — FENTANYL CITRATE (PF) 50 MCG/ML IJ SOLN: 50 mcg/mL | INTRAMUSCULAR | Qty: 25

## 2016-08-08 ENCOUNTER — Emergency Department: Admit: 2016-08-08 | Payer: MEDICAID | Primary: Internal Medicine

## 2016-08-08 ENCOUNTER — Inpatient Hospital Stay
Admit: 2016-08-08 | Discharge: 2016-08-08 | Disposition: A | Discharge status: Home / Self Care | Payer: MEDICAID | Attending: Emergency Medicine

## 2016-08-08 ENCOUNTER — Emergency Department: Payer: MEDICAID | Primary: Internal Medicine

## 2016-08-08 DIAGNOSIS — H6690 Otitis media, unspecified, unspecified ear: Secondary | ICD-10-CM

## 2016-08-08 LAB — CBC WITH AUTOMATED DIFF
BASOPHILS: 0.3 % (ref 0–3)
EOSINOPHILS: 2.1 % (ref 0–5)
HCT: 33.6 % — ABNORMAL LOW (ref 37.0–50.0)
HGB: 10.6 gm/dl — ABNORMAL LOW (ref 13.0–17.2)
IMMATURE GRANULOCYTES: 0.3 % (ref 0.0–3.0)
LYMPHOCYTES: 8.7 % — ABNORMAL LOW (ref 28–48)
MCH: 25.9 pg (ref 25.4–34.6)
MCHC: 31.5 gm/dl (ref 30.0–36.0)
MCV: 82.2 fL (ref 80.0–98.0)
MONOCYTES: 4.4 % (ref 1–13)
MPV: 10.4 fL — ABNORMAL HIGH (ref 6.0–10.0)
NEUTROPHILS: 84.2 % — ABNORMAL HIGH (ref 34–64)
NRBC: 0 (ref 0–0)
PLATELET: 209 10*3/uL (ref 140–450)
RBC: 4.09 M/uL (ref 3.60–5.20)
RDW-SD: 50.1 — ABNORMAL HIGH (ref 36.4–46.3)
WBC: 6.6 10*3/uL (ref 4.0–11.0)

## 2016-08-08 LAB — METABOLIC PANEL, BASIC
BUN: 13 mg/dl (ref 7–25)
CO2: 29 mEq/L (ref 21–32)
Calcium: 8.5 mg/dl (ref 8.5–10.1)
Chloride: 107 mEq/L (ref 98–107)
Creatinine: 0.7 mg/dl (ref 0.6–1.3)
GFR est AA: >60
GFR est non-AA: >60
Glucose: 78 mg/dl (ref 74–106)
Potassium: 3.7 mEq/L (ref 3.5–5.1)
Sodium: 142 mEq/L (ref 136–145)

## 2016-08-08 MED ORDER — AMOXICILLIN 500 MG TABLET
500 mg | ORAL_TABLET | Freq: Two times a day (BID) | ORAL | 0 refills | Status: AC
Start: 2016-08-08 — End: 2016-08-15

## 2016-08-08 NOTE — ED Triage Notes (Signed)
Pt reports right sided facial pain. Pt reports pain when eating. Pt states she stepped on a wire and it when into her right foot in June.

## 2016-08-08 NOTE — ED Notes (Signed)
PICC team here

## 2016-08-08 NOTE — ED Provider Notes (Signed)
[facility]  Emergency Department Treatment Report        Patient: [name] [name] Age: 65 y.o. Sex: female    Date of Birth: 01/08/1951 Admit Date: 08/08/2016 PCP: Lynnae SandhoffJodi Newcombe, MD   MRN: [id number]  CSN: [id number]  Attending: Kizzie BaneHughes, MD   Room: ER03/ER03 Time Dictated: 12:20 PM APP: Caren GriffinsKristen L Fiza Nation, PA-C     Chief Complaint   Chief Complaint   Patient presents with   ??? Foot Pain   ??? Facial Pain       History of Present Illness   65 y.o. female with extensive medical hx including DM, Lupus, CAD, who presents for evaluation of right great toe pain and right facial pain and swelling. 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  She notes intermittent low the last 15 years, states this is different.  She states she stepped on A. tach in the 4 last year and has been having this pain since then.  She describes an intermittent throbbing pain that is worse with weightbearing.  She also notes intermittent swelling and numbness on the medial side of the toe.  She notes she has an appointment with podiatry in January.  She describes the right facial pain and swelling as a sharp, throbbing pain that occurs when she opens her mouth.  She also notes a popping "gristle sound " when she opens her mouth.  She states this has been going on for one month.  She denies fever, chills, dental pain, ear pain, headache, numbness or tingling.    Review of Systems   Review of Systems   Constitutional: Negative for chills and fever.   HENT: Positive for hearing loss (right ear). Negative for congestion, ear discharge, ear pain, sore throat and tinnitus.    Eyes: Negative for blurred vision and double vision.   Respiratory: Negative for cough and shortness of breath.    Cardiovascular: Negative for chest pain and leg swelling.   Gastrointestinal: Negative for abdominal pain, diarrhea, nausea and vomiting.   Genitourinary: Negative for dysuria, frequency and urgency.    Musculoskeletal: Negative for back pain and neck pain.        Right great toe pain   Skin: Negative for itching and rash.   Neurological: Negative for dizziness, tingling and headaches.       Past Medical/Surgical History     Past Medical History:   Diagnosis Date   ??? Acute hypercapnic respiratory failure (HCC) 02/05/2013    W/ Encephalopathy requiring BiPAP, Etiology Uncertain however Possible Narcotic Benzodiazipine Related?    ??? Adjustment disorder with mixed anxiety and depressed mood    ??? Aneurysm of internal carotid artery    ??? Arthritis of knee    ??? Asthma    ??? Autoimmune disease (HCC)    ??? CAD (coronary artery disease)    ??? Cervicalgia    ??? Chest pain 03/17/2014    Dobutamine Stress Echo: NORMAL WALL MOTION AND GLOBAL SYSTOLIC FUNCTION AT REST AND AFTER?? STRESS/EXERCISE WITH APPROPRIATE AUGMENTATION OF FUNCTION IN ALL SEGMENTS. NO EVIDENCE OF INDUCIBLE ISCHEMIA AT ADEQUATE HEART RATE WITH DOBUTAMINE STRESS.??            ??? Chronic low back pain 10/22/2014    [facility]: Moderate facet arthropathy at L4-L5 and L5-S1. Mild/moderate foraminal stenosis at these levels. Disc bulge with annular tear at L4-L5.    ??? Chronic pain syndrome    ???  Coccydynia    ??? Diabetes mellitus     NIDDM   ??? DVT (deep venous thrombosis) (HCC) 05/19/2007    [facility] CTA: 1. Small nonocclusive pulmonary embolus in a subsegmental branch to the left lower lobe. Larger filling defect in a segmental branch to the right lower lobe, not as low in attenuation as is normally seen with a pulmonary embolus, however is seen in multiple planes and remains concerning for an additional nonocclusive pulmonary embolus.   ??? Fibromyalgia    ??? Gastric ulcer    ??? Gastritis 01/03/2015    [facility] Admission, EGD + H. Pylori/Gastritis   ??? Generalized osteoarthritis of multiple sites    ??? GI bleed 01/01/2015    [facility] Admit 5/2-01/07/2015   ??? H. pylori infection 01/03/2015    [facility] Admission, EGD + H. Pylori/Gastritis   ??? Hemiparesis, right (HCC)     ??? Hyperlipidemia    ??? Hypertension    ??? Irregular sleep-wake rhythm    ??? Knee joint pain    ??? Lumbar spondylosis     Orthopedic Spine Surgeon: Dr. Ree Kida L. Siegel    ??? Lupus    ??? Muscle spasm    ??? Normocytic anemia     Chronic Iron Deficiency Anemia   ??? Osteoporosis    ??? Pancreatitis    ??? Peripheral neuropathy    ??? Pulmonary embolism (HCC)     Multiple   ??? Seizure (HCC)    ??? Seizures (HCC)    ??? Sensory ataxia    ??? SLE (systemic lupus erythematosus) (HCC)    ??? Stroke [facility])    ??? Subclinical hypothyroidism    ??? Vitamin D deficiency      Past Surgical History:   Procedure Laterality Date   ??? CARDIAC SURG PROCEDURE UNLIST      Cardiac Cath PCI w/ Stents   ??? HX CHOLECYSTECTOMY     ??? HX GI      Partial Gastrectomy    ??? HX GI  01/03/2015    [facility] EGD by Dr. Smitty Cords D. Waldholtz: S/P B-2 Gastric Resection, Gastritis in small remnant, Bx of Jejunum r/o Celiac Disease and of Stomach. + Gastritis + H. Pylori   ??? HX HYSTERECTOMY         Social History     Social History     Social History   ??? Marital status: DIVORCED     Spouse name: N/A   ??? Number of children: N/A   ??? Years of education: N/A     Occupational History   ??? Not on file.     Social History Main Topics   ??? Smoking status: Former Smoker   ??? Smokeless tobacco: Never Used   ??? Alcohol use No   ??? Drug use: No   ??? Sexual activity: Not Currently     Partners: Male     Other Topics Concern   ??? Not on file     Social History Narrative       Family History     Family History   Problem Relation Age of Onset   ??? Diabetes Maternal Grandmother      Current Medications     (Not in a hospital admission)    Allergies     Allergies   Allergen Reactions   ??? Tape [Adhesive] Itching     Paper tape      ??? Aspirin Not Reported This Time     Because of hx ulcers per patient   ???  Opana [Oxymorphone] Rash and Itching   ??? Tylenol [Acetaminophen] Itching       Physical Exam   ED Triage Vitals   Enc Vitals Group      BP 08/08/16 1114 149/79      Pulse (Heart Rate) 08/08/16 1114 90       Resp Rate 08/08/16 1114 18      Temp 08/08/16 1114 98.6 ??F (37 ??C)      Temp src --       O2 Sat (%) 08/08/16 1114 98 %      Weight 08/08/16 1108 144 lb      Height 08/08/16 1108 5\' 8"       Head Cir --       Peak Flow --       Pain Score --       Pain Loc --       Pain Edu? --       Excl. in GC? --      Physical Exam   Constitutional: She is oriented to person, place, and time and well-developed, well-nourished, and in no distress. No distress.   HENT:   Head: Normocephalic and atraumatic.   Right Ear: External ear normal. Tympanic membrane is not injected and not bulging. A middle ear effusion (purulent) is present.   Left Ear: Tympanic membrane and external ear normal.   Tenderness over the lateral portion of the zygomatic bone.  There is minimal swelling with no erythema.  No underlying nodule or fluctuance.  No parotid swelling.   Eyes: Conjunctivae are normal. No scleral icterus.   Neck: Normal range of motion. Neck supple.   No nuchal rigidity   Cardiovascular: Normal rate, regular rhythm, normal heart sounds and intact distal pulses.    Pulmonary/Chest: Effort normal and breath sounds normal. No respiratory distress. She has no wheezes. She has no rales.   Abdominal: Soft. She exhibits no distension. There is no tenderness.   Musculoskeletal: Normal range of motion. She exhibits no edema.   Right great toe: Distal tip amputation.  Normal range of motion.  Tenderness at the tip of the toe and plantar side.  Plantar side is calloused with no erythema or warmth.  Brisk capillary refill.  Normal sensation.   Lymphadenopathy:     She has no cervical adenopathy.   Neurological: She is alert and oriented to person, place, and time.   Skin: Skin is warm and dry. No rash noted. She is not diaphoretic. No erythema.   Psychiatric: Mood, memory, affect and judgment normal.        Impression and Management Plan   This is a 65 y.o. female who presents for evaluation of right toe pain and  right-sided facial pain.  Patient's vitals are unremarkable.  Physical exam as noted above.  Patient has been evaluated for this toe pain same toe pain 2 months ago.  Will review workup however patient is insisting on x-ray.  There is a small amount of purulent middle ear ear effusion in the right ear.  Will check appropriate labs and CT max/face to rule out osteonecrosis or other infectious source of facial pain.    Diagnostic Studies   Lab:   Recent Results (from the past 12 hour(s))   CBC WITH AUTOMATED DIFF    Collection Time: 08/08/16 12:24 PM   Result Value Ref Range    WBC 6.6 4.0 - 11.0 1000/mm3    RBC 4.09 3.60 - 5.20 M/uL    HGB 10.6 (  L) 13.0 - 17.2 gm/dl    HCT 96.033.6 (L) 45.437.0 - 50.0 %    MCV 82.2 80.0 - 98.0 fL    MCH 25.9 25.4 - 34.6 pg    MCHC 31.5 30.0 - 36.0 gm/dl    PLATELET 098209 119140 - 147450 1000/mm3    MPV 10.4 (H) 6.0 - 10.0 fL    RDW-SD 50.1 (H) 36.4 - 46.3      NRBC 0 0 - 0      IMMATURE GRANULOCYTES 0.3 0.0 - 3.0 %    NEUTROPHILS 84.2 (H) 34 - 64 %    LYMPHOCYTES 8.7 (L) 28 - 48 %    MONOCYTES 4.4 1 - 13 %    EOSINOPHILS 2.1 0 - 5 %    BASOPHILS 0.3 0 - 3 %   METABOLIC PANEL, BASIC    Collection Time: 08/08/16 12:24 PM   Result Value Ref Range    Sodium 142 136 - 145 mEq/L    Potassium 3.7 3.5 - 5.1 mEq/L    Chloride 107 98 - 107 mEq/L    CO2 29 21 - 32 mEq/L    Glucose 78 74 - 106 mg/dl    BUN 13 7 - 25 mg/dl    Creatinine 0.7 0.6 - 1.3 mg/dl    GFR est AA >82.9>60.0      GFR est non-AA >60      Calcium 8.5 8.5 - 10.1 mg/dl     Labs Reviewed   CBC WITH AUTOMATED DIFF - Abnormal; Notable for the following:        Result Value    HGB 10.6 (*)     HCT 33.6 (*)     MPV 10.4 (*)     RDW-SD 50.1 (*)     NEUTROPHILS 84.2 (*)     LYMPHOCYTES 8.7 (*)     All other components within normal limits   METABOLIC PANEL, BASIC       Imaging:    Xr Foot Rt Min 3 V    Result Date: 08/08/2016  Right foot routine 3 views Reason for exam: Injury No fracture, acute bone joint process or significant abnormal findings.      IMPRESSION: Prior amputation distal phalanx great toe. Otherwise normal study. Unchanged 05/09/2016.     Ct Maxillofacial Wo Cont    Result Date: 08/08/2016  Indication: Right-sided facial pain when eating.     IMPRESSION: No significant abnormality seen. Degenerative changes in cervical spine. Comment: CT images of the facial bones was obtained without intravenous contrast. The sinuses are pneumatized. The orbits are intact. The visualized brain is unremarkable. The airways are intact. No adenopathy. The salivary glands are intact. No fracture or dislocation. Chronic discogenic disease is noted in the cervical spine with mild disc space narrowing. There is more advanced chronic discogenic disease at C6-7 mild neural foramen encroachment.       ED Course/Medical Decision Making   Patient did not develop any new or worsening symptoms and has remained stable during their course in the ED. patient's labs are unremarkable.  Glucose is within normal limits.  CT of the toe shows no acute abnormalities.  CT max face shows no significant abnormalities.  Gave prescription for amoxicillin to treat acute otitis media.  Instructed patient to follow up with her podiatrist as scheduled and her primary care on Monday.  Patient was instructed to return to the ER if condition worsens or new symptoms develop.  The patient indicates understanding of these  issues and agrees with the plan. I feel the patient is stable for discharge at this time.     Discharge Medication List as of 08/08/2016  2:38 PM      START taking these medications    Details   amoxicillin 500 mg tab Take 500 mg by mouth two (2) times a day for 7 days., Print, Disp-14 Tab, R-0         CONTINUE these medications which have NOT CHANGED    Details   BUSPIRONE HCL (BUSPAR PO) Take  by mouth two (2) times a day., Historical Med      pantoprazole (PROTONIX) 40 mg tablet Take 1 Tab by mouth two (2) times a day., Normal, Disp-60 Tab, R-3       ascorbic acid, vitamin C, (VITAMIN C) 250 mg tablet Take 1 Tab by mouth two (2) times a day. Take with iron pill, Normal, Disp-60 Tab, R-3      oxyCODONE IR (OXY-IR) 15 mg immediate release tablet Take 1 Tab by mouth four (4) times daily. Max Daily Amount: 60 mg., Print, Disp-10 Tab, R-0      simvastatin (ZOCOR) 80 mg tablet Take 80 mg by mouth nightly., Historical Med      lamoTRIgine (LAMICTAL) 100 mg tablet Take 1 Tab by mouth two (2) times a day., Print, Disp-60 Tab, R-0      Cholecalciferol, Vitamin D3, (VITAMIN D) 1,000 unit Cap Take 50,000 Units by mouth every seven (7) days., Historical Med      APIXABAN (ELIQUIS PO) Take  by mouth., Historical Med      predniSONE (DELTASONE) 20 mg tablet Tale 3 tabs daily for 3 days, then 2 tabs daily for 3 days, then one tab daily for three days by mouth, Print, Disp-18 Tab, R-0             Final Diagnosis       ICD-10-CM ICD-9-CM   1. Acute otitis media, unspecified otitis media type H66.90 382.9   2. Pain of toe of right foot M79.674 729.5   3. Facial pain R51 784.0       Disposition   Discharge to home    Patient has been evaluated by myself and Dr. Kizzie Bane who agrees with the above assessment and plan.     Caren Griffins, PA-C  August 08, 2016  12:20 PM      My signature above authenticates this document and my orders, the final ??  diagnosis (es), discharge prescription (s), and instructions in the Epic ??  record.  If you have any questions please contact [phone].  ??  Nursing notes have been reviewed by the physician/ advanced practice ??  Clinician.

## 2016-08-08 NOTE — ED Notes (Signed)
I have reviewed discharge instructions with the patient.  The patient verbalized understanding.

## 2016-08-21 ENCOUNTER — Emergency Department: Admit: 2016-08-21 | Payer: MEDICAID | Primary: Internal Medicine

## 2016-08-21 ENCOUNTER — Inpatient Hospital Stay
Admit: 2016-08-21 | Discharge: 2016-08-21 | Disposition: A | Discharge status: Home / Self Care | Payer: MEDICAID | Attending: Emergency Medicine

## 2016-08-21 DIAGNOSIS — I2510 Atherosclerotic heart disease of native coronary artery without angina pectoris: Secondary | ICD-10-CM

## 2016-08-21 LAB — CBC WITH AUTOMATED DIFF
BASOPHILS: 0.8 % (ref 0–3)
EOSINOPHILS: 3.3 % (ref 0–5)
HCT: 33.9 % — ABNORMAL LOW (ref 37.0–50.0)
HGB: 10.8 gm/dl — ABNORMAL LOW (ref 13.0–17.2)
IMMATURE GRANULOCYTES: 0.2 % (ref 0.0–3.0)
LYMPHOCYTES: 32.8 % (ref 28–48)
MCH: 25.5 pg (ref 25.4–34.6)
MCHC: 31.9 gm/dl (ref 30.0–36.0)
MCV: 80 fL (ref 80.0–98.0)
MONOCYTES: 8.5 % (ref 1–13)
MPV: 11.1 fL — ABNORMAL HIGH (ref 6.0–10.0)
NEUTROPHILS: 54.4 % (ref 34–64)
NRBC: 0 (ref 0–0)
PLATELET: 384 10*3/uL (ref 140–450)
RBC: 4.24 M/uL (ref 3.60–5.20)
RDW-SD: 51.9 — ABNORMAL HIGH (ref 36.4–46.3)
WBC: 6 10*3/uL (ref 4.0–11.0)

## 2016-08-21 LAB — METABOLIC PANEL, BASIC
BUN: 8 mg/dl (ref 7–25)
CO2: 26 mEq/L (ref 21–32)
Calcium: 9 mg/dl (ref 8.5–10.1)
Chloride: 108 mEq/L — ABNORMAL HIGH (ref 98–107)
Creatinine: 0.6 mg/dl (ref 0.6–1.3)
GFR est AA: >60
GFR est non-AA: >60
Glucose: 99 mg/dl (ref 74–106)
Potassium: 3.8 mEq/L (ref 3.5–5.1)
Sodium: 143 mEq/L (ref 136–145)

## 2016-08-21 LAB — POC TROPONIN
Troponin-I: 0 ng/ml (ref 0.00–0.07)
Troponin-I: 0 ng/ml (ref 0.00–0.07)

## 2016-08-21 LAB — D DIMER: D DIMER: 0.36 ug/mL (FEU) (ref 0.01–0.50)

## 2016-08-21 LAB — D-DIMER, QUANTITATIVE: D-Dimer, Quant: 0.36 ug/mL (FEU) (ref 0.01–0.50)

## 2016-08-21 MED ORDER — ALUM-MAG HYDROXIDE-SIMETH 200 MG-200 MG-20 MG/5 ML ORAL SUSP
200-200-20 mg/5 mL | ORAL | Status: DC | PRN
Start: 2016-08-21 — End: 2016-08-22

## 2016-08-21 MED ORDER — ALBUTEROL SULFATE 0.083 % (0.83 MG/ML) SOLN FOR INHALATION
2.5 mg /3 mL (0.083 %) | RESPIRATORY_TRACT | Status: DC | PRN
Start: 2016-08-21 — End: 2016-08-22

## 2016-08-21 MED ORDER — ONDANSETRON (PF) 4 MG/2 ML INJECTION
4 mg/2 mL | Freq: Once | INTRAMUSCULAR | Status: AC
Start: 2016-08-21 — End: 2016-08-21
  Administered 2016-08-21: 20:00:00 via INTRAVENOUS

## 2016-08-21 MED ORDER — LORAZEPAM 2 MG/ML IJ SOLN
2 mg/mL | Freq: Once | INTRAMUSCULAR | Status: AC
Start: 2016-08-21 — End: 2016-08-21
  Administered 2016-08-21: 21:00:00 via INTRAVENOUS

## 2016-08-21 MED ORDER — SODIUM CHLORIDE 0.9 % IV
Freq: Once | INTRAVENOUS | Status: AC
Start: 2016-08-21 — End: 2016-08-21
  Administered 2016-08-21: 20:00:00 via INTRAVENOUS

## 2016-08-21 MED ORDER — SODIUM CHLORIDE 0.9 % IV
INTRAVENOUS | Status: AC
Start: 2016-08-21 — End: 2016-08-22
  Administered 2016-08-21: via INTRAVENOUS

## 2016-08-21 MED ORDER — APIXABAN 5 MG TABLET
5 mg | Freq: Two times a day (BID) | ORAL | Status: DC
Start: 2016-08-21 — End: 2016-08-22
  Administered 2016-08-22 (×2): via ORAL

## 2016-08-21 MED ORDER — MORPHINE 2 MG/ML INJECTION
2 mg/mL | INTRAMUSCULAR | Status: DC | PRN
Start: 2016-08-21 — End: 2016-08-22

## 2016-08-21 MED ORDER — NALOXONE 0.4 MG/ML INJECTION
0.4 mg/mL | INTRAMUSCULAR | Status: DC | PRN
Start: 2016-08-21 — End: 2016-08-22

## 2016-08-21 MED ORDER — SODIUM CHLORIDE 0.9 % IJ SYRG
Freq: Three times a day (TID) | INTRAMUSCULAR | Status: DC
Start: 2016-08-21 — End: 2016-08-22
  Administered 2016-08-22 (×3): via INTRAVENOUS

## 2016-08-21 MED ORDER — SODIUM CHLORIDE 0.9 % IJ SYRG
INTRAMUSCULAR | Status: DC | PRN
Start: 2016-08-21 — End: 2016-08-22

## 2016-08-21 MED ORDER — DIPHENHYDRAMINE HCL 50 MG/ML IJ SOLN
50 mg/mL | Freq: Once | INTRAMUSCULAR | Status: AC
Start: 2016-08-21 — End: 2016-08-21
  Administered 2016-08-21: 20:00:00 via INTRAVENOUS

## 2016-08-21 MED FILL — DIPHENHYDRAMINE HCL 50 MG/ML IJ SOLN: 50 mg/mL | INTRAMUSCULAR | Qty: 1

## 2016-08-21 MED FILL — SODIUM CHLORIDE 0.9 % IV: INTRAVENOUS | Qty: 1000

## 2016-08-21 MED FILL — LORAZEPAM 2 MG/ML IJ SOLN: 2 mg/mL | INTRAMUSCULAR | Qty: 1

## 2016-08-21 MED FILL — ELIQUIS 5 MG TABLET: 5 mg | ORAL | Qty: 1

## 2016-08-21 MED FILL — ONDANSETRON (PF) 4 MG/2 ML INJECTION: 4 mg/2 mL | INTRAMUSCULAR | Qty: 2

## 2016-08-21 NOTE — Other (Signed)
Bedside and Verbal shift change report given to [facility] RN (oncoming nurse) by Divine,RN (offgoing nurse). Report included the following information SBAR, Kardex, ED Summary, Procedure Summary, Intake/Output, MAR, Recent Results and Cardiac Rhythm SR.

## 2016-08-21 NOTE — ED Provider Notes (Signed)
[facility]  Emergency Department Treatment Report    Patient: [name] [name] Age: 65 y.o. Sex: female    Date of Birth: May 30, 1951 Admit Date: 08/21/2016 PCP: Lynnae Sandhoff, MD   MRN: [id number]  CSN: [id number]  Attending: Konrad Felix, MD   Room: ER21/ER21 Time Dictated: 2:20 PM APP: Lily Lovings, PA-C     I hereby certify this patient for admission based upon medical necessity as ??  noted below:     Chief Complaint   Chief Complaint   Patient presents with   ??? Chest Pain (Angina)   ??? Generalized Body Aches       History of Present Illness   65 y.o. female with history of CAD, chest pain, M Elizabeth, fibromyalgia, lupus, seizures and stroke, presenting to the ED via EMS for sternal chest pain and generalized body aches which started last night. She states it has been constant, fluctuating in intensity, and currently 10 out of 10.  The chest pain is described as heaviness.  The chest pain is worse with palpation and taking a deep breath.  She is on Eliquis for prior pulmonary embolism, she has not taken a dose today but otherwise she has not missed any doses.  Associated symptoms include shortness of breath, nausea, vomiting.     Review of Systems   Review of Systems   Constitutional: Positive for chills and malaise/fatigue. Negative for diaphoresis and fever.   HENT: Negative for congestion and sore throat.    Eyes: Negative for discharge and redness.   Respiratory: Positive for shortness of breath. Negative for cough.    Cardiovascular: Positive for chest pain. Negative for palpitations and leg swelling.   Gastrointestinal: Positive for nausea and vomiting. Negative for abdominal pain and diarrhea.   Genitourinary: Negative for dysuria.   Musculoskeletal: Positive for myalgias.   Skin: Negative for rash.   Neurological: Negative for headaches.       Past Medical/Surgical History     Past Medical History:   Diagnosis Date   ??? Acute hypercapnic respiratory failure (HCC) 02/05/2013     W/ Encephalopathy requiring BiPAP, Etiology Uncertain however Possible Narcotic Benzodiazipine Related?    ??? Adjustment disorder with mixed anxiety and depressed mood    ??? Aneurysm of internal carotid artery    ??? Arthritis of knee    ??? Asthma    ??? Autoimmune disease (HCC)    ??? CAD (coronary artery disease)    ??? Cervicalgia    ??? Chest pain 03/17/2014    Dobutamine Stress Echo: NORMAL WALL MOTION AND GLOBAL SYSTOLIC FUNCTION AT REST AND AFTER?? STRESS/EXERCISE WITH APPROPRIATE AUGMENTATION OF FUNCTION IN ALL SEGMENTS. NO EVIDENCE OF INDUCIBLE ISCHEMIA AT ADEQUATE HEART RATE WITH DOBUTAMINE STRESS.??            ??? Chronic low back pain 10/22/2014    [facility] MRI Lumbar w/o contrast: Moderate facet arthropathy at L4-L5 and L5-S1. Mild/moderate foraminal stenosis at these levels. Disc bulge with annular tear at L4-L5.    ??? Chronic pain syndrome    ??? Coccydynia    ??? Diabetes mellitus     NIDDM   ??? DVT (deep venous thrombosis) (HCC) 05/19/2007    [facility] CTA: 1. Small nonocclusive pulmonary embolus in a subsegmental branch to the left lower lobe. Larger filling defect in a segmental branch to the right lower lobe, not as low in attenuation as is normally seen with a pulmonary embolus, however is seen in multiple planes and remains concerning  for an additional nonocclusive pulmonary embolus.   ??? Fibromyalgia    ??? Gastric ulcer    ??? Gastritis 01/03/2015    [facility] Admission, EGD + H. Pylori/Gastritis   ??? Generalized osteoarthritis of multiple sites    ??? GI bleed 01/01/2015    [facility] Admit 5/2-01/07/2015   ??? H. pylori infection 01/03/2015    [facility] Admission, EGD + H. Pylori/Gastritis   ??? Hemiparesis, right (HCC)    ??? Hyperlipidemia    ??? Hypertension    ??? Irregular sleep-wake rhythm    ??? Knee joint pain    ??? Lumbar spondylosis     Orthopedic Spine Surgeon: Dr. Ree Kida L. Siegel    ??? Lupus    ??? Muscle spasm    ??? Normocytic anemia     Chronic Iron Deficiency Anemia   ??? Osteoporosis    ??? Pancreatitis    ??? Peripheral neuropathy     ??? Pulmonary embolism (HCC)     Multiple   ??? Seizure (HCC)    ??? Seizures (HCC)    ??? Sensory ataxia    ??? SLE (systemic lupus erythematosus) (HCC)    ??? Stroke [facility])    ??? Subclinical hypothyroidism    ??? Vitamin D deficiency      Past Surgical History:   Procedure Laterality Date   ??? CARDIAC SURG PROCEDURE UNLIST      Cardiac Cath PCI w/ Stents   ??? HX CHOLECYSTECTOMY     ??? HX GI      Partial Gastrectomy    ??? HX GI  01/03/2015    [facility] EGD by Dr. Smitty Cords D. Waldholtz: S/P B-2 Gastric Resection, Gastritis in small remnant, Bx of Jejunum r/o Celiac Disease and of Stomach. + Gastritis + H. Pylori   ??? HX HYSTERECTOMY         Social History     Social History     Social History   ??? Marital status: DIVORCED     Spouse name: N/A   ??? Number of children: N/A   ??? Years of education: N/A     Social History Main Topics   ??? Smoking status: Former Smoker   ??? Smokeless tobacco: Never Used   ??? Alcohol use No   ??? Drug use: No   ??? Sexual activity: Not Currently     Partners: Male     Other Topics Concern   ??? None     Social History Narrative       Family History     Family History   Problem Relation Age of Onset   ??? Diabetes Maternal Grandmother        Current Medications     Prior to Admission Medications   Prescriptions Last Dose Informant Patient Reported? Taking?   APIXABAN (ELIQUIS PO)   Yes No   Sig: Take  by mouth.   BUSPIRONE HCL (BUSPAR PO)   Yes No   Sig: Take  by mouth two (2) times a day.   Cholecalciferol, Vitamin D3, (VITAMIN D) 1,000 unit Cap   Yes No   Sig: Take 50,000 Units by mouth every seven (7) days.   ascorbic acid, vitamin C, (VITAMIN C) 250 mg tablet   No No   Sig: Take 1 Tab by mouth two (2) times a day. Take with iron pill   lamoTRIgine (LAMICTAL) 100 mg tablet   No No   Sig: Take 1 Tab by mouth two (2) times a day.   oxyCODONE IR (OXY-IR) 15  mg immediate release tablet   No No   Sig: Take 1 Tab by mouth four (4) times daily. Max Daily Amount: 60 mg.   pantoprazole (PROTONIX) 40 mg tablet   No No    Sig: Take 1 Tab by mouth two (2) times a day.   predniSONE (DELTASONE) 20 mg tablet   No No   Sig: Tale 3 tabs daily for 3 days, then 2 tabs daily for 3 days, then one tab daily for three days by mouth   simvastatin (ZOCOR) 80 mg tablet   Yes No   Sig: Take 80 mg by mouth nightly.      Facility-Administered Medications: None       Allergies     Allergies   Allergen Reactions   ??? Tape [Adhesive] Itching     Paper tape      ??? Aspirin Not Reported This Time     Because of hx ulcers per patient   ??? Opana [Oxymorphone] Rash and Itching   ??? Tylenol [Acetaminophen] Itching       Physical Exam   ED Triage Vitals   Enc Vitals Group      BP 08/21/16 1401 144/97      Pulse (Heart Rate) 08/21/16 1401 97      Resp Rate 08/21/16 1401 19      Temp 08/21/16 1401 98.1 ??F (36.7 ??C)      Temp src --       O2 Sat (%) 08/21/16 1401 99 %      Weight 08/21/16 1355 143 lb      Height 08/21/16 1355 5\' 7"      Physical Exam  Constitutional: Patient appears well developed and well nourished. Appearance and behavior are age and situation appropriate.  She is tearful.   HEENT: Normocephalic and atraumatic. Conjunctiva clear.  PERRL. Examination of the external ears and nose unremarkable. No rhinorrhea. TMs without erythema, bulging or retraction bilaterally. Mucous membranes moist, non-erythematous. Surface of the pharynx, palate, and tongue are pink, moist and without lesions.  Neck: supple without meningeal signs, symmetrical, no masses or JVD visualized.   Respiratory: lungs clear to auscultation, nonlabored respirations. No tachypnea or accessory muscle use.  Cardiovascular: heart regular rate and rhythm without murmur, rubs or gallops. Clear S1, S2. Chest excursion normal. Calves soft and non-tender. Radial and DP pulses 2+ and equal bilaterally.  No peripheral edema.    Gastrointestinal:  Bowel sounds present. Abdomen soft and nondistended, nontender to palpation. No organomegaly or masses palpable.    Musculoskeletal: No deformity noted on all 4 extremities. No joint or bony tenderness to palpation.  No swollen joints or erythematous joints noted.  Nail beds pink with prompt capillary refill  Integumentary: warm and dry without rashes   Neurologic: alert and oriented, Sensation to light touch intact in all extremities.  Moving all extremities well. No facial asymmetry or dysarthria.     Impression and Management Plan   This is a 65 y.o. female presenting to the ED for evaluation of chest pain and generalized body pain.  She has history of lupus, fibromyalgia, coronary artery disease, and pulmonary embolism.  She is on Eliquis and is not missed any doses besides today.  Pulmonary embolism is unlikely given that she is on Eliquis although it is considered. Acute ischemic coronary disease must be considered first, and the patient protected against consequences of same, while other etiologies (including infectious, pulmonary, GI and musculoskeletal)  are considered.  Blood pressure is elevated, otherwise  vital signs hemodynamically stable and she is afebrile.    Diagnostic Studies   Lab:   Recent Results (from the past 12 hour(s))   CBC WITH AUTOMATED DIFF    Collection Time: 08/21/16  2:00 PM   Result Value Ref Range    WBC 6.0 4.0 - 11.0 1000/mm3    RBC 4.24 3.60 - 5.20 M/uL    HGB 10.8 (L) 13.0 - 17.2 gm/dl    HCT 96.033.9 (L) 45.437.0 - 50.0 %    MCV 80.0 80.0 - 98.0 fL    MCH 25.5 25.4 - 34.6 pg    MCHC 31.9 30.0 - 36.0 gm/dl    PLATELET 098384 119140 - 147450 1000/mm3    MPV 11.1 (H) 6.0 - 10.0 fL    RDW-SD 51.9 (H) 36.4 - 46.3      NRBC 0 0 - 0      IMMATURE GRANULOCYTES 0.2 0.0 - 3.0 %    NEUTROPHILS 54.4 34 - 64 %    LYMPHOCYTES 32.8 28 - 48 %    MONOCYTES 8.5 1 - 13 %    EOSINOPHILS 3.3 0 - 5 %    BASOPHILS 0.8 0 - 3 %   METABOLIC PANEL, BASIC    Collection Time: 08/21/16  2:00 PM   Result Value Ref Range    Sodium 143 136 - 145 mEq/L    Potassium 3.8 3.5 - 5.1 mEq/L    Chloride 108 (H) 98 - 107 mEq/L     CO2 26 21 - 32 mEq/L    Glucose 99 74 - 106 mg/dl    BUN 8 7 - 25 mg/dl    Creatinine 0.6 0.6 - 1.3 mg/dl    GFR est AA >82.9>60.0      GFR est non-AA >60      Calcium 9.0 8.5 - 10.1 mg/dl   D DIMER    Collection Time: 08/21/16  2:00 PM   Result Value Ref Range    D DIMER 0.36 0.01 - 0.50 ug/mL (FEU)   POC TROPONIN-I    Collection Time: 08/21/16  2:07 PM   Result Value Ref Range    Troponin-I 0.00 0.00 - 0.07 ng/ml       Imaging:    Xr Chest Pa Lat    Result Date: 08/21/2016  Clinical history: Chest pain EXAMINATION: PA and lateral views of the chest 08/21/2016 Correlation: 07/14/2016 FINDINGS: Trachea and heart size are within normal limits. Lungs are clear. There is hyperinflation.     IMPRESSION: COPD.         EKG: Normal sinus rhythm.  80 bpm.  Normal intervals.   Dr. Henrene HawkingSiegel did not see any acute S-T segment or T-wave abnormalities that are consistent with acute ischemia or infarction.    Medical Decision Making/ED Course   Patient remained stable during her stay in the ED, she did not develop any new or worsening symptoms.  Her notes reviewed, she had a negative nuclear stress test in 12/2015.  She continued to complain of pain, however, she has been in pain management before and was released for selling her narcotic medication and not showing up for drug screens.  I did not feel comfortable providing her with narcotics.  Initial troponin negative.  EKG without signs of acute ischemia or infarct.  Chest x-ray reveals COPD but no acute process.  No significant abnormality to her laboratory studies today.  D-dimer within normal limits.  No need for CTA of the chest.  Results of  lab/radiology tests were discussed with the patient. All questions were answered and concerns addressed.    I spoke with Dr. Corinda Gubler, he stated to discuss the case with cardiology prior to admission.  I believe she needs to be admitted for serial enzymes given her history of coronary artery disease and stent placement.      Procedures     Medications   ondansetron (ZOFRAN) injection 4 mg (4 mg IntraVENous Given 08/21/16 1430)   0.9% sodium chloride infusion (150 mL/hr IntraVENous New Bag 08/21/16 1430)   diphenhydrAMINE (BENADRYL) injection 25 mg (25 mg IntraVENous Given 08/21/16 1430)   LORazepam (ATIVAN) injection 0.5 mg (0.5 mg IntraVENous Given 08/21/16 1550)        Final Diagnosis       ICD-10-CM ICD-9-CM   1. Chest pain, unspecified type R07.9 786.50   2. SOB (shortness of breath) R06.02 786.05   3. Intractable vomiting with nausea, unspecified vomiting type R11.2 536.2   4. Myalgia M79.1 729.1       Disposition     Admission        The patient was personally evaluated by myself and LEWIS Daisy Floro, MD who agrees with the above assessment and plan.    Dragon medical dictation software was used for portions of this report. Unintended transcription errors may occur.     Wyvonnia Dusky, PA-C  August 21, 2016      My signature above authenticates this document and my orders, the final ??  diagnosis (es), discharge prescription (s), and instructions in the Epic record.  If you have any questions please contact [phone].  ??  Nursing notes have been reviewed by the physician/ advanced practice Clinician.

## 2016-08-21 NOTE — Progress Notes (Signed)
PAGER ID: [phone]   MESSAGE: Dr. Corinda GublerVerma your patient in CD27 [name] [name] is requesting for her Carafate and amitriptyline that she takes at home. I have updated her PTA medications to include these. Thank you. Manell [phone] / [phone]

## 2016-08-21 NOTE — ED Triage Notes (Signed)
Developed chest pain last pm with generalized body aches.  Pain has been continuous.  History of Lupus

## 2016-08-21 NOTE — ED Notes (Signed)
Pt continuously asking for food.  Given ice chips

## 2016-08-21 NOTE — H&P (Signed)
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 Chest Pain (Angina)   ??? Generalized Body Aches       HISTORY OF PRESENT ILLNESS:     [name] [name] is a 65 y.o. BLACK OR [place] female who presents with chest pain and generalized body aches  She said the pain has been constant fluttering intensity described reported heaviness worse with deep palpation and taking a breath  She has history of PE and is on Eliquis for the same  She has history of fibromyalgia or lupus and seizures      Past Medical History:   Diagnosis Date   ??? Acute hypercapnic respiratory failure (HCC) 02/05/2013    W/ Encephalopathy requiring BiPAP, Etiology Uncertain however Possible Narcotic Benzodiazipine Related?    ??? Adjustment disorder with mixed anxiety and depressed mood    ??? Aneurysm of internal carotid artery    ??? Arthritis of knee    ??? Asthma    ??? Autoimmune disease (HCC)    ??? CAD (coronary artery disease)    ??? Cervicalgia    ??? Chest pain 03/17/2014    Dobutamine Stress Echo: NORMAL WALL MOTION AND GLOBAL SYSTOLIC FUNCTION  AT REST AND AFTER?? STRESS/EXERCISE WITH APPROPRIATE AUGMENTATION OF FUNCTION IN ALL SEGMENTS. NO EVIDENCE OF INDUCIBLE ISCHEMIA AT ADEQUATE HEART RATE WITH DOBUTAMINE STRESS.??            ??? Chronic low back pain 10/22/2014    [facility] MRI Lumbar w/o contrast: Moderate facet arthropathy at L4-L5 and L5-S1. Mild/moderate foraminal stenosis at these levels. Disc bulge with annular tear at L4-L5.    ??? Chronic pain syndrome    ??? Coccydynia    ??? Diabetes mellitus     NIDDM   ??? DVT (deep venous thrombosis) (HCC) 05/19/2007    [facility] CTA: 1. Small nonocclusive pulmonary embolus in a subsegmental branch to the left lower lobe. Larger filling defect in a segmental branch to the right lower lobe, not as low in attenuation as is normally seen with a pulmonary embolus, however is seen in multiple planes and remains concerning for an additional nonocclusive pulmonary embolus.   ??? Fibromyalgia    ??? Gastric ulcer    ??? Gastritis 01/03/2015    [facility] Admission, EGD + H. Pylori/Gastritis   ??? Generalized osteoarthritis of multiple sites    ???  GI bleed 01/01/2015    [facility] Admit 5/2-01/07/2015   ??? H. pylori infection 01/03/2015    [facility] Admission, EGD + H. Pylori/Gastritis   ??? Hemiparesis, right (HCC)    ??? Hyperlipidemia    ??? Hypertension    ??? Irregular sleep-wake rhythm    ??? Knee joint pain    ??? Lumbar spondylosis     Orthopedic Spine Surgeon: Dr. Ree KidaJack L. Siegel    ??? Lupus    ??? Muscle spasm    ??? Normocytic anemia     Chronic Iron Deficiency Anemia   ??? Osteoporosis    ??? Pancreatitis    ??? Peripheral neuropathy    ??? Pulmonary embolism (HCC)     Multiple   ??? Seizure (HCC)    ??? Seizures (HCC)    ??? Sensory ataxia    ??? SLE (systemic lupus erythematosus) (HCC)    ??? Stroke [facility](HCC)    ??? Subclinical hypothyroidism    ??? Vitamin D deficiency         Past Surgical History:   Procedure Laterality Date   ??? CARDIAC SURG PROCEDURE UNLIST      Cardiac Cath PCI w/ Stents   ??? HX CHOLECYSTECTOMY     ??? HX GI      Partial Gastrectomy    ??? HX GI  01/03/2015     [facility] EGD by Dr. Smitty CordsBruce D. Waldholtz: S/P B-2 Gastric Resection, Gastritis in small remnant, Bx of Jejunum r/o Celiac Disease and of Stomach. + Gastritis + H. Pylori   ??? HX HYSTERECTOMY         Social History   Substance Use Topics   ??? Smoking status: Former Smoker   ??? Smokeless tobacco: Never Used   ??? Alcohol use No        Family History   Problem Relation Age of Onset   ??? Diabetes Maternal Grandmother         Allergies   Allergen Reactions   ??? Tape [Adhesive] Itching     Paper tape      ??? Aspirin Not Reported This Time     Because of hx ulcers per patient   ??? Opana [Oxymorphone] Rash and Itching   ??? Tylenol [Acetaminophen] Itching        Prior to Admission Medications   Prescriptions Last Dose Informant Patient Reported? Taking?   APIXABAN (ELIQUIS PO)   Yes No   Sig: Take  by mouth.   BUSPIRONE HCL (BUSPAR PO)   Yes No   Sig: Take  by mouth two (2) times a day.   Cholecalciferol, Vitamin D3, (VITAMIN D) 1,000 unit Cap   Yes No   Sig: Take 50,000 Units by mouth every seven (7) days.   ascorbic acid, vitamin C, (VITAMIN C) 250 mg tablet   No No   Sig: Take 1 Tab by mouth two (2) times a day. Take with iron pill   lamoTRIgine (LAMICTAL) 100 mg tablet   No No   Sig: Take 1 Tab by mouth two (2) times a day.   oxyCODONE IR (OXY-IR) 15 mg immediate release tablet   No No   Sig: Take 1 Tab by mouth four (4) times daily. Max Daily Amount: 60 mg.   pantoprazole (PROTONIX) 40 mg tablet   No No   Sig: Take 1 Tab by mouth two (2) times a day.   predniSONE (DELTASONE) 20 mg tablet   No No   Sig: Tale 3 tabs daily for 3 days,  then 2 tabs daily for 3 days, then one tab daily for three days by mouth   simvastatin (ZOCOR) 80 mg tablet   Yes No   Sig: Take 80 mg by mouth nightly.      Facility-Administered Medications: None           REVIEW OF SYSTEMS:      Unable to obtain  ROS due to  mental status change  sedated   intubated   Total of 12 systems reviewed as follows:  Constitutional: Positive for malaise and fatigue   Eyes:   negative visual changes  ENT:   negative sore throat, tongue or lip swelling  Respiratory: Positive for shortness of breath  Cards:  Positive for chest pain   GI:   negative for nausea, vomiting, diarrhea, and abdominal pain  Genitourinary: negative for frequency, dysuria  Integument:  negative for rash and pruritus  Hematologic:  negative for easy bruising and gum/nose bleeding  Musculoskel: negative for myalgias,  back pain and muscle weakness  Neurological:  negative for headaches, dizziness, vertigo  Behavl/Psych: negative for feelings of anxiety, depression       Objective:   VITALS:    Visit Vitals   ??? BP 121/74   ??? Pulse 88   ??? Temp 98.1 ??F (36.7 ??C)   ??? Resp 17   ??? Ht 5\' 7"  (1.702 m)   ??? Wt 64.9 kg (143 lb)   ??? SpO2 97%   ??? BMI 22.4 kg/m2     Temp (24hrs), Avg:98.1 ??F (36.7 ??C), Min:98.1 ??F (36.7 ??C), Max:98.1 ??F (36.7 ??C)      PHYSICAL EXAM:   General:    Alert, cooperative, no distress, appears stated age.     Head:   Normocephalic, without obvious abnormality, atraumatic.  Eyes:   Conjunctivae clear, anicteric sclerae.  Pupils are equal  Nose:  Nares normal. No drainage or sinus tenderness.  Throat:    Lips, mucosa, and tongue normal.  No Thrush  Neck:  Supple, symmetrical,  no adenopathy, thyroid: non tender    no carotid bruit and no JVD.  Back:    Symmetric,  No CVA tenderness.  Lungs:   Clear to auscultation bilaterally.  No Wheezing or Rhonchi. No rales.  Chest wall:  No tenderness or deformity. No Accessory muscle use.  Heart:   Regular rate and rhythm,  no murmur, rub or gallop.  Abdomen:   Soft, non-tender. Not distended.  Bowel sounds normal. No masses  Extremities: Extremities normal, atraumatic, No cyanosis.  No edema. No clubbing  Skin:     Texture, turgor normal. No rashes or lesions.  Not Jaundiced  Lymph nodes: Cervical, supraclavicular normal.  Psych:  Good insight.  Not depressed.  Not anxious or agitated.  Neurologic: EOMs intact. No facial asymmetry. No aphasia or slurred  speech. Normal  strength, Alert and oriented X 3.       LAB DATA REVIEWED:    No components found for: [facility]  Recent Labs      08/21/16   1400   NA  143   K  3.8   CL  108*   CO2  26   BUN  8   CREA  0.6   GLU  99   CA  9.0   WBC  6.0   HGB  10.8*   HCT  33.9*   PLT  384           IMAGING RESULTS:    Xr Chest  Pa Lat    Result Date: 08/21/2016  Clinical history: Chest pain EXAMINATION: PA and lateral views of the chest 08/21/2016 Correlation: 07/14/2016 FINDINGS: Trachea and heart size are within normal limits. Lungs are clear. There is hyperinflation.     IMPRESSION: COPD.     Recent Results (from the past 12 hour(s))   CBC WITH AUTOMATED DIFF    Collection Time: 08/21/16  2:00 PM   Result Value Ref Range    WBC 6.0 4.0 - 11.0 1000/mm3    RBC 4.24 3.60 - 5.20 M/uL    HGB 10.8 (L) 13.0 - 17.2 gm/dl    HCT 91.433.9 (L) 78.237.0 - 50.0 %    MCV 80.0 80.0 - 98.0 fL    MCH 25.5 25.4 - 34.6 pg    MCHC 31.9 30.0 - 36.0 gm/dl    PLATELET 956384 213140 - 086450 1000/mm3    MPV 11.1 (H) 6.0 - 10.0 fL    RDW-SD 51.9 (H) 36.4 - 46.3      NRBC 0 0 - 0      IMMATURE GRANULOCYTES 0.2 0.0 - 3.0 %    NEUTROPHILS 54.4 34 - 64 %    LYMPHOCYTES 32.8 28 - 48 %    MONOCYTES 8.5 1 - 13 %    EOSINOPHILS 3.3 0 - 5 %    BASOPHILS 0.8 0 - 3 %   METABOLIC PANEL, BASIC    Collection Time: 08/21/16  2:00 PM   Result Value Ref Range    Sodium 143 136 - 145 mEq/L    Potassium 3.8 3.5 - 5.1 mEq/L    Chloride 108 (H) 98 - 107 mEq/L    CO2 26 21 - 32 mEq/L    Glucose 99 74 - 106 mg/dl    BUN 8 7 - 25 mg/dl    Creatinine 0.6 0.6 - 1.3 mg/dl    GFR est AA >57.8>60.0      GFR est non-AA >60      Calcium 9.0 8.5 - 10.1 mg/dl   D DIMER    Collection Time: 08/21/16  2:00 PM   Result Value Ref Range    D DIMER 0.36 0.01 - 0.50 ug/mL (FEU)   POC TROPONIN-I    Collection Time: 08/21/16  2:07 PM   Result Value Ref Range    Troponin-I 0.00 0.00 - 0.07 ng/ml   POC TROPONIN-I    Collection Time: 08/21/16  5:29 PM   Result Value Ref Range    Troponin-I 0.00 0.00 - 0.07 ng/ml          Care Plan discussed with:     Patient   Family    ED Care Manager  ED Doc   Specialist :    Total care time spent on reviewing the case/data/notes/EMR,examining the patient,documentation,coordinating care with nurses and consultants is 37 minutes.       ___________________________________________________  Admitting Physician: Roderic ScarceSourabh Mavrik Bynum, MD     Dragon medical dictation software was used for portions of this report.  Unintended voice transcription errors may have occurred.

## 2016-08-21 NOTE — Other (Signed)
TRANSFER - OUT REPORT:    Verbal report given to Devine(name) on Clemencia CourseVeronica E Mayo  being transferred to CDU27(unit) for routine progression of care       Report consisted of patient???s Situation, Background, Assessment and   Recommendations(SBAR).     Information from the following report(s) Kardex, ED Summary, Procedure Summary, MAR, Accordion and Recent Results was reviewed with the receiving nurse.    Lines:   Peripheral IV 08/21/16 Left;Mid;Upper  (Active)   Site Assessment Clean, dry, & intact 08/21/2016  5:03 PM   Phlebitis Assessment 0 08/21/2016  5:03 PM   Infiltration Assessment 0 08/21/2016  5:03 PM   Dressing Status Clean, dry, & intact;New;Occlusive 08/21/2016  5:03 PM   Dressing Type Transparent 08/21/2016  5:03 PM   Hub Color/Line Status Patent;Flushed;Capped 08/21/2016  5:03 PM   Alcohol Cap Used Yes 08/21/2016  5:03 PM       Peripheral IV 08/21/16 Right;Upper  (Active)   Site Assessment Clean, dry, & intact 08/21/2016  3:25 PM   Phlebitis Assessment 0 08/21/2016  3:25 PM   Infiltration Assessment 0 08/21/2016  3:25 PM   Dressing Status Clean, dry, & intact;New;Occlusive 08/21/2016  3:25 PM   Dressing Type Transparent 08/21/2016  3:25 PM   Hub Color/Line Status Patent;Flushed;Capped 08/21/2016  3:25 PM   Alcohol Cap Used No 08/21/2016  3:25 PM        Opportunity for questions and clarification was provided.      Patient transported with:   [employer]

## 2016-08-22 ENCOUNTER — Inpatient Hospital Stay
Admit: 2016-08-22 | Discharge: 2016-08-23 | Disposition: A | Discharge status: Home / Self Care | Payer: MEDICAID | Attending: Emergency Medicine

## 2016-08-22 ENCOUNTER — Emergency Department: Admit: 2016-08-22 | Payer: MEDICAID | Primary: Internal Medicine

## 2016-08-22 DIAGNOSIS — K623 Rectal prolapse: Secondary | ICD-10-CM

## 2016-08-22 LAB — CBC WITH AUTOMATED DIFF
BASOPHILS: 1 % (ref 0–3)
BASOPHILS: 0.3 % (ref 0–3)
EOSINOPHILS: 3.3 % (ref 0–5)
EOSINOPHILS: 0.6 % (ref 0–5)
HCT: 30 % — ABNORMAL LOW (ref 37.0–50.0)
HCT: 33.8 % — ABNORMAL LOW (ref 37.0–50.0)
HGB: 9.5 gm/dl — ABNORMAL LOW (ref 13.0–17.2)
HGB: 10.4 gm/dl — ABNORMAL LOW (ref 13.0–17.2)
IMMATURE GRANULOCYTES: 0.2 % (ref 0.0–3.0)
IMMATURE GRANULOCYTES: 0.3 % (ref 0.0–3.0)
LYMPHOCYTES: 32.8 % (ref 28–48)
LYMPHOCYTES: 15.5 % — ABNORMAL LOW (ref 28–48)
MCH: 25.8 pg (ref 25.4–34.6)
MCH: 25.4 pg (ref 25.4–34.6)
MCHC: 31.7 gm/dl (ref 30.0–36.0)
MCHC: 30.8 gm/dl (ref 30.0–36.0)
MCV: 81.5 fL (ref 80.0–98.0)
MCV: 82.4 fL (ref 80.0–98.0)
MONOCYTES: 8 % (ref 1–13)
MONOCYTES: 6 % (ref 1–13)
MPV: 10.2 fL — ABNORMAL HIGH (ref 6.0–10.0)
MPV: 10.4 fL — ABNORMAL HIGH (ref 6.0–10.0)
NEUTROPHILS: 54.7 % (ref 34–64)
NEUTROPHILS: 77.3 % — ABNORMAL HIGH (ref 34–64)
NRBC: 0 (ref 0–0)
NRBC: 0 (ref 0–0)
PLATELET: 322 10*3/uL (ref 140–450)
PLATELET: 340 10*3/uL (ref 140–450)
RBC: 3.68 M/uL (ref 3.60–5.20)
RBC: 4.1 M/uL (ref 3.60–5.20)
RDW-SD: 52.6 — ABNORMAL HIGH (ref 36.4–46.3)
RDW-SD: 53.1 — ABNORMAL HIGH (ref 36.4–46.3)
WBC: 6.1 10*3/uL (ref 4.0–11.0)
WBC: 7.8 10*3/uL (ref 4.0–11.0)

## 2016-08-22 LAB — TROPONIN I
Troponin-I: <0.015 ng/ml (ref 0.000–0.045)
Troponin-I: <0.015 ng/ml (ref 0.000–0.045)
Troponin-I: <0.015 ng/ml (ref 0.000–0.045)

## 2016-08-22 LAB — METABOLIC PANEL, BASIC
BUN: 10 mg/dl (ref 7–25)
BUN: 6 mg/dl — ABNORMAL LOW (ref 7–25)
CO2: 26 mEq/L (ref 21–32)
CO2: 26 mEq/L (ref 21–32)
Calcium: 8.2 mg/dl — ABNORMAL LOW (ref 8.5–10.1)
Calcium: 9.1 mg/dl (ref 8.5–10.1)
Chloride: 112 mEq/L — ABNORMAL HIGH (ref 98–107)
Chloride: 104 mEq/L (ref 98–107)
Creatinine: 0.6 mg/dl (ref 0.6–1.3)
Creatinine: 0.6 mg/dl (ref 0.6–1.3)
GFR est AA: >60
GFR est AA: >60
GFR est non-AA: >60
GFR est non-AA: >60
Glucose: 121 mg/dl — ABNORMAL HIGH (ref 74–106)
Glucose: 90 mg/dl (ref 74–106)
Potassium: 4.2 mEq/L (ref 3.5–5.1)
Potassium: 4 mEq/L (ref 3.5–5.1)
Sodium: 145 mEq/L (ref 136–145)
Sodium: 138 mEq/L (ref 136–145)

## 2016-08-22 LAB — LACTIC ACID: Lactic Acid: 1.8 mmol/L (ref 0.4–2.0)

## 2016-08-22 MED ORDER — OXYCODONE 15 MG TAB
15 mg | Freq: Four times a day (QID) | ORAL | Status: DC
Start: 2016-08-22 — End: 2016-08-22
  Administered 2016-08-22 (×3): via ORAL

## 2016-08-22 MED ORDER — SIMVASTATIN 40 MG TAB
40 mg | Freq: Every evening | ORAL | Status: DC
Start: 2016-08-22 — End: 2016-08-22
  Administered 2016-08-22: 04:00:00 via ORAL

## 2016-08-22 MED ORDER — MORPHINE 4 MG/ML SYRINGE
4 mg/mL | Freq: Once | INTRAMUSCULAR | Status: AC
Start: 2016-08-22 — End: 2016-08-22
  Administered 2016-08-22: 23:00:00 via INTRAVENOUS

## 2016-08-22 MED ORDER — PANTOPRAZOLE 40 MG TAB, DELAYED RELEASE
40 mg | Freq: Two times a day (BID) | ORAL | Status: DC
Start: 2016-08-22 — End: 2016-08-22
  Administered 2016-08-22 (×2): via ORAL

## 2016-08-22 MED ORDER — ONDANSETRON HCL 4 MG TAB
4 mg | ORAL_TABLET | Freq: Three times a day (TID) | ORAL | 0 refills | Status: DC | PRN
Start: 2016-08-22 — End: 2017-02-20

## 2016-08-22 MED ORDER — SUCRALFATE 100 MG/ML ORAL SUSP
100 mg/mL | Freq: Two times a day (BID) | ORAL | Status: DC
Start: 2016-08-22 — End: 2016-08-22
  Administered 2016-08-22 (×2): via ORAL

## 2016-08-22 MED ORDER — IOPAMIDOL 76 % IV SOLN
370 mg iodine /mL (76 %) | Freq: Once | INTRAVENOUS | Status: AC
Start: 2016-08-22 — End: 2016-08-22
  Administered 2016-08-22: via INTRAVENOUS

## 2016-08-22 MED ORDER — AMITRIPTYLINE 25 MG TAB
25 mg | Freq: Every evening | ORAL | Status: DC
Start: 2016-08-22 — End: 2016-08-22
  Administered 2016-08-22: 04:00:00 via ORAL

## 2016-08-22 MED ORDER — SODIUM CHLORIDE 0.9 % IJ SYRG
Freq: Once | INTRAMUSCULAR | Status: AC
Start: 2016-08-22 — End: 2016-08-22
  Administered 2016-08-22: 23:00:00 via INTRAVENOUS

## 2016-08-22 MED ORDER — ONDANSETRON (PF) 4 MG/2 ML INJECTION
4 mg/2 mL | INTRAMUSCULAR | Status: DC | PRN
Start: 2016-08-22 — End: 2016-08-22
  Administered 2016-08-22: 16:00:00 via INTRAVENOUS

## 2016-08-22 MED ORDER — APIXABAN 5 MG TABLET
5 mg | ORAL_TABLET | Freq: Two times a day (BID) | ORAL | 0 refills | Status: DC
Start: 2016-08-22 — End: 2017-11-16

## 2016-08-22 MED ORDER — ONDANSETRON (PF) 4 MG/2 ML INJECTION
4 mg/2 mL | INTRAMUSCULAR | Status: AC
Start: 2016-08-22 — End: 2016-08-22
  Administered 2016-08-22: 23:00:00 via INTRAVENOUS

## 2016-08-22 MED ORDER — LACTATED RINGERS IV
INTRAVENOUS | Status: DC
Start: 2016-08-22 — End: 2016-08-22
  Administered 2016-08-22: 23:00:00 via INTRAVENOUS

## 2016-08-22 MED ORDER — LAMOTRIGINE 100 MG TAB
100 mg | Freq: Two times a day (BID) | ORAL | Status: DC
Start: 2016-08-22 — End: 2016-08-22
  Administered 2016-08-22 (×2): via ORAL

## 2016-08-22 MED FILL — ONDANSETRON (PF) 4 MG/2 ML INJECTION: 4 mg/2 mL | INTRAMUSCULAR | Qty: 2

## 2016-08-22 MED FILL — SUCRALFATE 100 MG/ML ORAL SUSP: 100 mg/mL | ORAL | Qty: 10

## 2016-08-22 MED FILL — MORPHINE 4 MG/ML SYRINGE: 4 mg/mL | INTRAMUSCULAR | Qty: 1

## 2016-08-22 MED FILL — PANTOPRAZOLE 40 MG TAB, DELAYED RELEASE: 40 mg | ORAL | Qty: 1

## 2016-08-22 MED FILL — AMITRIPTYLINE 25 MG TAB: 25 mg | ORAL | Qty: 2

## 2016-08-22 MED FILL — BD POSIFLUSH NORMAL SALINE 0.9 % INJECTION SYRINGE: INTRAMUSCULAR | Qty: 10

## 2016-08-22 MED FILL — LAMOTRIGINE 100 MG TAB: 100 mg | ORAL | Qty: 1

## 2016-08-22 MED FILL — SIMVASTATIN 40 MG TAB: 40 mg | ORAL | Qty: 2

## 2016-08-22 MED FILL — ISOVUE-370  76 % INTRAVENOUS SOLUTION: 370 mg iodine /mL (76 %) | INTRAVENOUS | Qty: 85

## 2016-08-22 MED FILL — ELIQUIS 5 MG TABLET: 5 mg | ORAL | Qty: 1

## 2016-08-22 MED FILL — BD POSIFLUSH NORMAL SALINE 0.9 % INJECTION SYRINGE: INTRAMUSCULAR | Qty: 20

## 2016-08-22 MED FILL — OXYCODONE 15 MG TAB: 15 mg | ORAL | Qty: 1

## 2016-08-22 NOTE — Discharge Summary (Signed)
DISCHARGE SUMMARY     Patient Name: [name] [name]  Medical Record Number: [id number]  Date of Birth: [birth year]  Discharge Provider: Lawana Chambers, MD  Primary Care Provider: Lynnae Sandhoff, MD    Admit date: 08/21/2016  Discharge Date:  08-30-2016 Time: 3:14 PM  Discharge Disposition: Home  Code Status: Full Code    Follow-up appointments:     Follow-up Information     Follow up With Details Comments Contact Info    Lynnae Sandhoff, MD Schedule an appointment as soon as possible for a visit in 3 days PATIENT WILL CALL AND SET UP APPOINTMENT 3-5 DAYS [address]  [address]  [place] [address]  [phone]      [facility] [address], [place] [address]  [phone]           Follow-up recommendations:   PCP follow-up 3-5 days  Discharge diagnosis:  1. Primary diagnosis           Chest pain                    Hospital Problems as of 2016-08-30  Date Reviewed: 2016/08/30          Codes Class Noted - Resolved POA    * (Principal)Chest pain ICD-10-CM: R07.9  ICD-9-CM: 786.50  05/19/2015 - Present Unknown                                            Past Medical History:   Diagnosis Date   ??? Acute hypercapnic respiratory failure (HCC) 02/05/2013    W/ Encephalopathy requiring BiPAP, Etiology Uncertain however Possible Narcotic Benzodiazipine Related?    ??? Adjustment disorder with mixed anxiety and depressed mood    ??? Aneurysm of internal carotid artery    ??? Arthritis of knee    ??? Asthma    ??? Autoimmune disease (HCC)    ??? CAD (coronary artery disease)    ??? Cervicalgia    ??? Chest pain 03/17/2014    Dobutamine Stress Echo: NORMAL WALL MOTION AND GLOBAL SYSTOLIC FUNCTION AT REST AND AFTER?? STRESS/EXERCISE WITH APPROPRIATE AUGMENTATION OF FUNCTION IN ALL SEGMENTS. NO EVIDENCE OF INDUCIBLE ISCHEMIA AT ADEQUATE HEART RATE WITH DOBUTAMINE STRESS.??            ??? Chronic low back pain 10/22/2014    [facility] MRI Lumbar w/o contrast: Moderate facet arthropathy at L4-L5 and L5-S1.  Mild/moderate foraminal stenosis at these levels. Disc bulge with annular tear at L4-L5.    ??? Chronic pain syndrome    ??? Coccydynia    ??? Diabetes mellitus     NIDDM   ??? DVT (deep venous thrombosis) (HCC) 05/19/2007    [facility] CTA: 1. Small nonocclusive pulmonary embolus in a subsegmental branch to the left lower lobe. Larger filling defect in a segmental branch to the right lower lobe, not as low in attenuation as is normally seen with a pulmonary embolus, however is seen in multiple planes and remains concerning for an additional nonocclusive pulmonary embolus.   ??? Fibromyalgia    ??? Gastric ulcer    ??? Gastritis 01/03/2015    [facility] Admission, EGD + H. Pylori/Gastritis   ??? Generalized osteoarthritis of multiple sites    ??? GI bleed 01/01/2015    [facility] Admit 5/2-01/07/2015   ??? H. pylori infection 01/03/2015  [facility] Admission, EGD + H. Pylori/Gastritis   ??? Hemiparesis, right (HCC)    ??? Hyperlipidemia    ??? Hypertension    ??? Irregular sleep-wake rhythm    ??? Knee joint pain    ??? Lumbar spondylosis     Orthopedic Spine Surgeon: Dr. Ree Kida L. Siegel    ??? Lupus    ??? Muscle spasm    ??? Normocytic anemia     Chronic Iron Deficiency Anemia   ??? Osteoporosis    ??? Pancreatitis    ??? Peripheral neuropathy    ??? Pulmonary embolism (HCC)     Multiple   ??? Seizure (HCC)    ??? Seizures (HCC)    ??? Sensory ataxia    ??? SLE (systemic lupus erythematosus) (HCC)    ??? Stroke [facility])    ??? Subclinical hypothyroidism    ??? Vitamin D deficiency                                                         Discharge Medications:                 Discharge Medication List as of 08/22/2016 12:23 PM      START taking these medications    Details   ondansetron hcl (ZOFRAN, AS HYDROCHLORIDE,) 4 mg tablet Take 1 Tab by mouth every eight (8) hours as needed for Nausea., Normal, Disp-15 Tab, R-0         CONTINUE these medications which have CHANGED    Details   apixaban (ELIQUIS) 5 mg tablet Take 1 Tab by mouth every twelve (12) hours., No Print, Disp-30 Tab, R-0         CONTINUE  these medications which have NOT CHANGED    Details   amitriptyline (ELAVIL) 50 mg tablet Take 50 mg by mouth nightly., Historical Med      sucralfate (CARAFATE) 100 mg/mL suspension Take 1 tsp by mouth two (2) times a day., Historical Med      BUSPIRONE HCL (BUSPAR PO) Take  by mouth two (2) times a day., Historical Med      ascorbic acid, vitamin C, (VITAMIN C) 250 mg tablet Take 1 Tab by mouth two (2) times a day. Take with iron pill, Normal, Disp-60 Tab, R-3      lamoTRIgine (LAMICTAL) 100 mg tablet Take 1 Tab by mouth two (2) times a day., Print, Disp-60 Tab, R-0      Cholecalciferol, Vitamin D3, (VITAMIN D) 1,000 unit Cap Take 50,000 Units by mouth every seven (7) days., Historical Med      predniSONE (DELTASONE) 20 mg tablet Tale 3 tabs daily for 3 days, then 2 tabs daily for 3 days, then one tab daily for three days by mouth, Print, Disp-18 Tab, R-0      pantoprazole (PROTONIX) 40 mg tablet Take 1 Tab by mouth two (2) times a day., Normal, Disp-60 Tab, R-3      oxyCODONE IR (OXY-IR) 15 mg immediate release tablet Take 1 Tab by mouth four (4) times daily. Max Daily Amount: 60 mg., Print, Disp-10 Tab, R-0      simvastatin (ZOCOR) 80 mg tablet Take 80 mg by mouth nightly., Historical Med             Discharge Diet:  Diet: Cardiac Diet    Consultants: Cardiology    Procedures:   * No surgery found *        Most Recent BMP and CBC:    Recent Labs      08/22/16   0058   BUN  10   NA  145   CO2  26     Recent Labs      08/22/16   0058   WBC  6.1   RBC  3.68   HCT  30.0*   MCV  81.5   MCH  25.8   MCHC  31.7       Imaging:    Xr Chest Pa Lat    Result Date: 08/21/2016  Clinical history: Chest pain EXAMINATION: PA and lateral views of the chest 08/21/2016 Correlation: 07/14/2016 FINDINGS: Trachea and heart size are within normal limits. Lungs are clear. There is hyperinflation.     IMPRESSION: COPD.     Xr Foot Rt Min 3 V    Result Date: 08/08/2016  Right foot routine 3 views Reason for exam: Injury No  fracture, acute bone joint process or significant abnormal findings.     IMPRESSION: Prior amputation distal phalanx great toe. Otherwise normal study. Unchanged 05/09/2016.     Ct Maxillofacial Wo Cont    Result Date: 08/08/2016  Indication: Right-sided facial pain when eating.     IMPRESSION: No significant abnormality seen. Degenerative changes in cervical spine. Comment: CT images of the facial bones was obtained without intravenous contrast. The sinuses are pneumatized. The orbits are intact. The visualized brain is unremarkable. The airways are intact. No adenopathy. The salivary glands are intact. No fracture or dislocation. Chronic discogenic disease is noted in the cervical spine with mild disc space narrowing. There is more advanced chronic discogenic disease at C6-7 mild neural foramen encroachment.       Echo Results  (Last 48 hours)    None          History of presenting illness:( Per admitting M.D.)  [name] [name] is a 65 y.o. BLACK OR [place] female who presents with chest pain and generalized body aches  She said the pain has been constant fluttering intensity described reported heaviness worse with deep palpation and taking a breath  She has history of PE and is on Eliquis for the same  She has history of fibromyalgia or lupus and seizures    Hospital course:   Atypical chest pain likely GERD.  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  Patient kept on telemetry.  Serial cardiac markers were ordered.  She was continued on home medications.  EKG showed no ischemic change.  Cardiology she was consulted. patient was clinically improved.  Troponins were negative.  Chest pain was resolved.  Patient previous records reviewed.  Patient had stress test in Apri 2017 was negative.  Her echo was also  normal.  Cardiology recommended continuing medical management  Once patient was ruled out for acute coronary syndrome she is being discharged home   recommended to return to hospital if any worsening chest pain or shortness of breath    Vitals on Discharge:  Visit Vitals   ??? BP 142/69 (BP 1 Location: Right arm, BP Patient Position: Supine)   ??? Pulse 87   ???  Temp 97.8 ??F (36.6 ??C)   ??? Resp 17   ??? Ht 5\' 7"  (1.702 m)   ??? Wt 64.9 kg (143 lb)   ??? SpO2 97%   ??? Breastfeeding No   ??? BMI 22.4 kg/m2       Physical Exam:  Constitutional:??Awake and alert, NAD  HENT:??atraumatic, normocephalic, oropharynx clear ??  Eyes:??????conjunctiva normal  Neck:??????supple and trachea normal  Cardiovascular:?? Regular rate and rhythm, heart sounds normal, intact distal pulses  Pulmonary/Chest Wall: breath sounds normal and effort normal  Abdominal:????????????appearance normal, soft, non-tender upon palpation,bowel sounds normal.  Neurological:??????awake, alert and oriented, CN intact, no focal neuro deficits, moves all extremities equally .  Extremities:???? No clubbing or cyanosis. Pedal pulses 2+ b/l.       Discharge condition:  improved    Discharge activity and restrictions: Activity as tolerated    Time spent with discharging patient:   Discharge plan discussed with pt. All questions answered. Need for compliance with medicine and follow up emphasized. Pt verbalized the understanding of care. Time spent 35 minutes.      Lawana ChambersKhalid Abia Monaco, MD  08/22/2016  3:14 PM    Copies: Lynnae SandhoffJodi Newcombe, MD

## 2016-08-22 NOTE — Consults (Signed)
 Consults by Revonda Standard, PA-C at 08/22/16 1337                Author: Revonda Standard, PA-C  Service: Cardiology  Author Type: Physician Assistant       Filed: 08/22/16 1337  Date of Service: 08/22/16 1337  Status: Signed           Editor: Amil Amen (Physician Assistant)  CosignerRosario Jacks, Ramin at 09/12/16 0731            Consult Orders        1. CONSULT CVAL [[id number]] ordered by Wyvonnia Dusky, PA-C at 08/21/16 1636                                      [facility] Consultation Note         Referring Physician: Lily Lovings, PA-C   Outpatient Cardiologist: None    PCP: Dr. Lynnae Sandhoff         Impression:     1.  Atypical chest pain, troponin 0.00-0.00-negative x 3    2.  H/o chronic chest pain x years   ??  Cardiac cath 2007: Patent coronary arteries, EF 65%   ??  Stress echo 2011: Normal wall motion, normal systolic function.   ??  Echo 11/06/2013: EF 65%    ??  NST 11/07/2013: No ischemia, normal wall motion. EF 72%.    ??  Stress echo July 2015: Normal wall motion, normal systolic function.   ??  Echo 05/20/2015: EF 60%    ??  Echo 07/18/2015: EF 53%   ??  Echo 12/27/2015: EF 60%    ??  NST 12/28/2015: No reversible ischemia. EF 59%.    3.  Nausea/vomiting   4.  Lupus   5.  Fibromyalgia   6.  Chronic pain   7.  H/o DVT and PE   8.  Chronic anticoagulation, eliquis   9.  H/o steroid-induced diabetes mellitus    10.  GERD   11.  Hypertension   12.  H/o seizures   13.  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  Patient's pain is highly atypical with negative cardiac enzymes; she has had chronic chest pain for last several years with  normal cardiac testing   2.  Continue statin; noted allergy to aspirin   3.  No further invasive cardiac testing at this time, could consider cardiac CTA if additional imaging warranted      Final recommendations per cardiologist.      [name] [name] is a 65 y.o. female who is being seen on consult  for chest pain.        Chief Complaint       Patient presents with        ?  Chest Pain (Angina)        ?  Generalized Body Aches        Admission diagnosis: Chest pain     Patient Active Problem List        Diagnosis  Code         ?  Rib pain  R07.81     ?  Lupus  L93.0     ?  Osteoarthrosis, unspecified whether generalized or localized, unspecified site  M19.90     ?  Neuralgia, neuritis, and radiculitis, unspecified  IMO0002     ?  Myalgia and myositis, unspecified  IMO0001     ?  Pain in limb  M79.609     ?  Pain in joint, multiple sites  M25.50     ?  Type II or unspecified type diabetes mellitus without mention of complication, not stated as uncontrolled  E11.9     ?  Arthritis of knee  M17.10     ?  Encounter for long-term (current) use of other medications  Z79.899     ?  Abdominal pain, generalized  R10.84     ?  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  Back pain, lumbosacral  M54.5     ?  GI bleed  K92.2     ?  Gastrointestinal hemorrhage  K92.2     ?  Chest pain  R07.9     ?  Acute CVA (cerebrovascular accident) (HCC)  I63.9         ?  Pneumoperitoneum  K66.8        HPI:  [name] [name] is a  65 y.o. female with a past medical history of lupus, chronic pain, fibromyalgia, h/o gastric  bypass, h/o DVT/PE, chronic anticoagulation (eliquis), GERD, hypertension, and h/o seizures. [name] [name] presented to [facility] ED on 12/21 with a chief complaint of chest pain. Patient has had several hospitalizations for chest pain and she states her pain  has been occurring for years. She has had several normal stress tests and had a normal cardiac catheterization in 2007. She describes the pain as a tightness in the left side of her chest; she denies radiation of pain. She has associated dyspnea with  nausea/vomiting. She had an episode of vomiting while I was at bedside. She states she is weak and cannot do her normal activities such as caring for her grandchildren.       Currently patient denies  active chest pain or pressure, dyspnea, PND, orthopnea, palpitations, syncope, edema, nausea/vomiting, and diaphoresis.       Pertinent diagnostic findings:      Chest X-ray: COPD. Lungs are clear with hyperinflation.       Echocardiogram 12/27/2015: The LV is normal in size with normal systolic function and normal wall thickness. The estimated LV EF is 60%. The RV is normal size with normal systolic function. These are  no hemodynamically significant valvular flow abnormalities.       Nuclear stress test 12/28/2015: No reversible ischemia. EF estimated at 59%.        Cardiac catheterization 2007: Patent coronaries, EF 65%           Past Medical History:        Diagnosis  Date         ?  Acute hypercapnic respiratory failure (HCC)  02/05/2013          W/ Encephalopathy requiring BiPAP, Etiology Uncertain however Possible Narcotic Benzodiazipine Related?          ?  Adjustment disorder with mixed anxiety and depressed mood       ?  Aneurysm of internal carotid artery       ?  Arthritis of knee       ?  Asthma       ?  Autoimmune disease (HCC)       ?  CAD (coronary artery disease)       ?  Cervicalgia       ?  Chest pain  03/17/2014          Dobutamine Stress Echo: NORMAL WALL MOTION AND GLOBAL SYSTOLIC FUNCTION AT REST AND AFTER?? STRESS/EXERCISE WITH APPROPRIATE AUGMENTATION OF FUNCTION  IN ALL SEGMENTS. NO EVIDENCE OF INDUCIBLE ISCHEMIA AT ADEQUATE HEART RATE WITH DOBUTAMINE STRESS.??                  ?  Chronic low back pain  10/22/2014          [facility] Moderate facet arthropathy at L4-L5 and L5-S1. Mild/moderate foraminal stenosis at these levels. Disc bulge with annular  tear at L4-L5.          ?  Chronic pain syndrome       ?  Coccydynia       ?  Diabetes mellitus            NIDDM         ?  DVT (deep venous thrombosis) (HCC)  05/19/2007          [facility] CTA: 1. Small nonocclusive pulmonary embolus in a subsegmental branch to the left lower lobe. Larger filling defect in a segmental branch to   the right lower lobe, not as low in attenuation as is normally seen with a pulmonary embolus, however is seen in multiple planes and remains concerning for an additional nonocclusive pulmonary embolus.         ?  Fibromyalgia       ?  Gastric ulcer       ?  Gastritis  01/03/2015          [facility] Admission, EGD + H. Pylori/Gastritis         ?  Generalized osteoarthritis of multiple sites       ?  GI bleed  01/01/2015          [facility] Admit 5/2-01/07/2015         ?  H. pylori infection  01/03/2015          [facility] Admission, EGD + H. Pylori/Gastritis         ?  Hemiparesis, right (HCC)       ?  Hyperlipidemia       ?  Hypertension       ?  Irregular sleep-wake rhythm       ?  Knee joint pain       ?  Lumbar spondylosis            Orthopedic Spine Surgeon: Dr. Ree KidaJack L. Henrene HawkingSiegel          ?  Lupus       ?  Muscle spasm       ?  Normocytic anemia            Chronic Iron Deficiency Anemia         ?  Osteoporosis       ?  Pancreatitis       ?  Peripheral neuropathy       ?  Pulmonary embolism (HCC)            Multiple         ?  Seizure (HCC)       ?  Seizures (HCC)       ?  Sensory ataxia       ?  SLE (systemic lupus erythematosus) (HCC)       ?  Stroke [facility](HCC)       ?  Subclinical hypothyroidism           ?  Vitamin D deficiency            Past Surgical History:         Procedure  Laterality  Date          ?  CARDIAC SURG PROCEDURE UNLIST              Cardiac Cath PCI w/ Stents          ?  HX CHOLECYSTECTOMY         ?  HX GI              Partial Gastrectomy           ?  HX GI    01/03/2015          [facility] EGD by Dr. Smitty Cords D. Waldholtz: S/P B-2 Gastric Resection, Gastritis in small remnant, Bx of Jejunum r/o Celiac Disease and of Stomach. + Gastritis  + H. Pylori          ?  HX HYSTERECTOMY              Social History          Social History         ?  Marital status:  DIVORCED              Spouse name:  N/A         ?  Number of children:  N/A         ?  Years of education:  N/A          Occupational History        ?  Not on file.           Social History Main Topics         ?  Smoking status:  Former Smoker     ?  Smokeless tobacco:  Never Used     ?  Alcohol use  No     ?  Drug use:  No     ?  Sexual activity:  Not Currently              Partners:  Male           Other Topics  Concern        ?  Not on file          Social History Narrative          Family History         Problem  Relation  Age of Onset          ?  Diabetes  Maternal Grandmother               Allergies        Allergen  Reactions         ?  Tape [Adhesive]  Itching             Paper tape             ?  Aspirin  Not Reported This Time             Because of hx ulcers per patient         ?  Opana [Oxymorphone]  Rash and Itching         ?  Tylenol [Acetaminophen]  Itching                Home  Medications:          Prior to Admission medications             Medication  Sig  Start Date  End Date  Taking?  Authorizing Provider            apixaban (ELIQUIS) 5 mg tablet  Take 1 Tab by mouth every twelve (12) hours.  08/22/16    Yes  Khalid Mehmood, MD     ondansetron hcl (ZOFRAN, AS HYDROCHLORIDE,) 4 mg tablet  Take 1 Tab by mouth every eight (8) hours as needed for Nausea.  08/22/16    Yes  Khalid Mehmood, MD     amitriptyline (ELAVIL) 50 mg tablet  Take 50 mg by mouth nightly.      Yes  Historical Provider     sucralfate (CARAFATE) 100 mg/mL suspension  Take 1 tsp by mouth two (2) times a day.      Yes  Historical Provider     BUSPIRONE HCL (BUSPAR PO)  Take  by mouth two (2) times a day.      Yes  Phys Other, MD     ascorbic acid, vitamin C, (VITAMIN C) 250 mg tablet  Take 1 Tab by mouth two (2) times a day. Take with iron pill  12/29/15    Yes  Hazel Sams, MD     lamoTRIgine (LAMICTAL) 100 mg tablet  Take 1 Tab by mouth two (2) times a day.  01/07/15    Yes  Theodis Aguas, MD     Cholecalciferol, Vitamin D3, (VITAMIN D) 1,000 unit Cap  Take 50,000 Units by mouth every seven (7) days.      Yes  Historical Provider     predniSONE (DELTASONE) 20 mg tablet  Tale 3 tabs daily for 3 days, then 2  tabs daily for 3 days, then one tab daily for three days by mouth  07/14/16      Johny Drilling, MD     pantoprazole (PROTONIX) 40 mg tablet  Take 1 Tab by mouth two (2) times a day.  12/29/15      Hazel Sams, MD     oxyCODONE IR (OXY-IR) 15 mg immediate release tablet  Take 1 Tab by mouth four (4) times daily. Max Daily Amount: 60 mg.  07/20/15      Faythe Ghee, DO            simvastatin (ZOCOR) 80 mg tablet  Take 80 mg by mouth nightly.        Phys Other, MD           Inpt meds:     Current Facility-Administered Medications          Medication  Dose  Route  Frequency           ?  lamoTRIgine (LaMICtal) tablet 100 mg   100 mg  Oral  BID     ?  oxyCODONE IR (OXY-IR) immediate release tablet 15 mg   15 mg  Oral  QID     ?  pantoprazole (PROTONIX) tablet 40 mg   40 mg  Oral  BID     ?  simvastatin (ZOCOR) tablet 80 mg   80 mg  Oral  QHS     ?  sodium chloride (NS) flush 5-10 mL   5-10 mL  IntraVENous  Q8H     ?  apixaban (ELIQUIS) tablet 5 mg   5 mg  Oral  Q12H     ?  sucralfate (CARAFATE) 100 mg/mL oral suspension 0.5 g   0.5 g  Oral  BID           ?  amitriptyline (ELAVIL) tablet 50 mg   50 mg  Oral  QHS             Review of Systems:     (Positives in [place])   General: negative for chills, diaphoresis, fever, malaise/ fatigue and weight loss   Chronic disease: negative for h/o thyroid disorder, lupus , rheumatoid arthritis, gout, fibromyalgia   Neurological: negative for h/o CVA, TIA, seizure, tremor, neuropathy, LOC, dizziness, assistive devices (walker, cane, wheelchair), falls  in the past 6 months ??   HEENT: negative for congestion, hearing loss, nose bleeds, sore throat, stridor, ongoing or current dental/ENT infections, recent vision changes,  tinnitus, sinusitis, or cervical fusion   Cardiovascular: negative for chest pain /pressure, claudication, leg swelling, orthopnea, palpitations, prior atrial fibrillation, previous MI, prior cardiac arrest, CHF, PND, edema,  anticoagulant therapy (eliquis), varicose  veins, vein stripping, PVD, PAD, carotid disease, AAA, pacemaker, AICD, CAD, hypertension, hyperlipidemia   Pulmonary: negative for cough, hemoptysis, SOB/ DOE, sputum production, wheezing, sleep apnea,  remote smoker, asthma, bronchitis, COPD, h/o TB, home O2 use, use of inhalers, industrial exposure (asbestos, silicon, radon, coal), second hand smoke exposure, h/o lung cancer, resection, current  smoker   Gastrointestinal: negative for abdominal pain, heartburn, blood in stool, constipation, ulcers, diverticulosis, GERD, h/o GI bleed,  nausea and vomiting   Genitourinary: negative for dysuria, hematuria, frequency, urgency, frequent urinary infections, urinary retention, enlarged prostate, urethral  strictures ??   Endocrine: negative for h/o renal disease (AKI), diabetes mellitus or thyroid problems ??   Hematologic/lymphatic: negative for?? easy bruising/bleeding, iron or blood transfusions,  chronic anemia, cancer, DVT, PE ??   Integument: negative for rash, non-healing sores, skin disorder ??   Musculoskeletal: back pain, joint pain, neck pain   Psych: negative for anxiety, depression, insomnia, suicidal ideals, psychosis or ongoing psychiatric treatment. ??        Physical Assessment:          Visit Vitals         ?  BP  142/69 (BP 1 Location: Right arm, BP Patient Position: Supine)     ?  Pulse  87     ?  Temp  97.8 ??F (36.6 ??C)     ?  Resp  17     ?  Ht  5\' 7"  (1.702 m)     ?  Wt  64.9 kg (143 lb)     ?  SpO2  97%     ?  Breastfeeding  No         ?  BMI  22.4 kg/m2           General: Alert and oriented, no acute distress    HEENT: Oral mucosa well perfused; conjunctiva not injected   Neck: No JVD, supple   Respiratory: Clear to auscultation bilaterally; No wheezes or rales   Cardiovascular: regular rhythm normal s1and s2; No murmurs or rubs   Abdomen: Soft, Nontender; ND   Extremities: No clubbing, cyanosis or edema   Neuro: Alert and oriented, moves all four extremities   Musculoskeletal: Moving all extremities,  no joint tenderness, swelling, or edema   Skin: Warm, Dry, Intact      Labs:   CBC w/Diff     Lab Results         Component  Value  Date/Time  WBC  6.1  08/22/2016 12:58 AM       RBC  3.68  08/22/2016 12:58 AM       HCT  30.0 (L)  08/22/2016 12:58 AM       MCV  81.5  08/22/2016 12:58 AM       MCH  25.8  08/22/2016 12:58 AM       MCHC  31.7  08/22/2016 12:58 AM            RDW  23.5 (H)  02/26/2015 08:20 PM          Lab Results         Component  Value  Date/Time            MONOS  8.0  08/22/2016 12:58 AM       EOS  3.3  08/22/2016 12:58 AM            BASOS  1.0  08/22/2016 12:58 AM           Basic Metabolic Profile     Lab Results         Component  Value  Date            NA  145  08/22/2016       CO2  26  08/22/2016            BUN  10  08/22/2016           Cardiac Enzymes     Lab Results         Component  Value  Date            CPK  178  02/27/2015            CKMB  1.6  02/27/2015              Thyroid Studies     Lab Results         Component  Value  Date/Time            TSH  0.955  12/27/2015 02:44 AM        No components found for: T3, FREET3, T3UPTAKE, THYROXINBDGL      Coagulation     Lab Results         Component  Value  Date            INR  2.7 (H)  12/29/2015             Lab Results         Component  Value  Date/Time            HDL  70  07/18/2015 06:03 AM                 Kandy Garrisonhelsea , PA-C   [facility]    December 22, 201712:46 PM   Pager [phone]

## 2016-08-22 NOTE — Consults (Signed)
[facility]  [facility]  NAME:  [name], [name]  SEX:   F  ADMIT: 08/22/2016  DATE OF CONSULT: 08/22/2016  REFERRING PHYSICIAN:    DOB: 1951-03-04  MR#    [id number]  ROOM:  [id number]  ACCT#  [id number]    cc: [facility] , Victorino DikeJennifer Himmel-Salch DO, Lynnae SandhoffJodi Newcombe MD    Lynnae SandhoffJodi Newcombe, MD  [facility]  [address]  [place], [place] [id number]  [phone]     SURGICAL CONSULTATION    DATE OF CONSULTATION:  08/22/2016    REQUESTING PHYSICIANS:  Dr. Marina GoodellPerry and Dr. Victorino DikeJennifer Himmel-Salch, Emergency Department.    REASON FOR CONSULTATION:  Rectal prolapse.    HISTORY OF PRESENT ILLNESS:  The patient is a 65 year old female brought to the emergency room via EMS after discharge earlier this morning for noncardiac chest pain.  She reports the sensation of the need for a bowel movement and significant pelvic pressure and has been straining constantly without the ability to pass bowel movement.  She feels like she is voiding okay.  She denies chronic trouble with constipation.  According to records, she had an essentially normal colonoscopy in 2015, by Dr. Katrinka BlazingSmith.  She denies nausea but acknowledges deep low pelvic discomfort.  She feels like there is something pushing out of her rectum.    REVIEW OF REVIEW OF SYSTEMS:  The patient is a poor historian.  A full 12-point review of systems was completed, but uncertain as to accuracy.  Pertinent positives and negatives as in HPI.  In addition, she denies fevers, chills, nausea, vomiting, chest pain and shortness of breath.  She also denies dysuria.    PAST MEDICAL HISTORY:  1.  Systemic lupus erythematosus.  2.  Seizures.  3.  History of pulmonary embolism.  4.  Peripheral neuropathy.  5.  History of pancreatitis.  6.  Hypertension.  7.  Hyperlipidemia.  8.  History of gastric ulcer, status post gastrectomy.  9.  History of H. pylori and recurrent gastritis.  10.  History of DVT.  11.  Diabetes.  12.  Chronic pain syndrome, fibromyalgia,  chronic low back pain, cervicalgia.  13.  Coronary artery disease.  14.  Asthma.  15.  History of encephalopathy.  16.  History of stroke.    PAST SURGICAL HISTORY:  1.  Partial gastrectomy for ulcer disease  2.  Cardiac catheterization with stent placement.  3.  History of hysterectomy.  4.  History of cholecystectomy.    OUTPATIENT MEDICATIONS:  Buspirone, vitamin D, Elavil, Eliquis, vitamin C, Lamictal, Zofran, oxycodone, Protonix, Zocor, Carafate.    ALLERGIES:  ASPIRIN, OPANA, TYLENOL, PAPER TAPE.    PHYSICAL EXAMINATION:  GENERAL:  Thin, chronically ill-appearing female lying on the stretcher in mild distress.  VITAL SIGNS:  Heart rate 90.  Blood pressure 160/70, respirations 12, temperature 98 degrees Fahrenheit, oxygen saturation 98% on room air, height 5 feet 7, weight 143 pounds.  HEAD:  Normocephalic, atraumatic.  EYES:  No scleral icterus.    MOUTH:  Mucous membranes moist.  No oral ulcers.  NECK:  Supple.  Trachea midline.  No jugular venous distention.  LUNGS:  Clear to auscultation bilaterally.  HEART:  Regular rate and rhythm.  ABDOMEN:  Soft, nondistended.  Mild diffuse lower abdominal tenderness, nonspecific, no rebound or guarding.  Active straining and use of the rectus muscles throughout exam.  No palpable mass.    PELVIC/RECTAL:  Large cystocele protruding to the introitus.  Mild to moderate  combined hemorrhoids.  Intermittent slight mucosal prolapse with Valsalva.  No rectocele.  No full thickness rectal prolapse.  Rectal vault is empty.  Anal tone normal.    LABORATORIES:  White blood count 7.8, hemoglobin and hematocrit 10 and 33.8, platelets 340, 77% neutrophils.  Electrolytes normal with a BUN and creatinine of 6 and 0.6.  Lactic acid of 1.8.    IMAGING:  Acute abdominal series with nonspecific bowel gas pattern.  CT scan after Foley placement with mild dilation of the common bile duct and intrahepatic bile ducts, no change.  Evidence of prior cholecystectomy, gastrectomy and hysterectomy.   No bowel abnormalities noted on my review.    IMPRESSION:  Tenesmus, likely due to urinary retention, with presence of large full cystocele noted.  Symptoms resolved completely after placement of Foley catheter with initial drainage of 300 mL of urine, but within 20 minutes over a liter of urine.  The patient reports her symptoms resolved completely after placement of Foley catheter.  She has moderate hemorrhoidal swelling and very mild rectal mucosal prolapse related to the extensive straining from tenesmus.  The tenesmus ceased after her bladder was drained.  Review of CT scan from April of this year revealed a markedly distended bladder.  This may be a chronic problem.  She may benefit from urology workup as an outpatient with urodynamic testing.  No need for colorectal followup.    Thank you very much for involving me in the care of this patient.        ___________________  Brantley PersonsBeth R Derron Pipkins MD  Dictated By:.   MN  D:08/22/2016 21:53:52  T: 08/23/2016 05:15:46  [phone]

## 2016-08-22 NOTE — ED Provider Notes (Signed)
[facility]  Emergency Department Treatment Report        Patient: [name] [name] Age: 65 y.o. Sex: female    Date of Birth: 1951/07/16 Admit Date: 08/22/2016 PCP: Lynnae Sandhoff, MD   MRN: [id number]  CSN: [id number]     Room: ER31/ER31 Time Dictated: 4:49 PM      Chief Complaint   Chief Complaint   Patient presents with   ??? Anal Pain       History of Present Illness   65 y.o. female presents with prolapsing rectum via EMS.  Patient was discharged yesterday after evaluation by cardiology for atypical chest pain.  Patient reported to EMS that she was recently admitted for heart attack.  However, review of recent admission documents demonstrate no actual MI.  Patient was given a discharge diagnosis of atypical chest pain.  Today she states she felt like she needed to defecate but has been unable.  She also has felt her rectum prolapsing. She has continual tenesmus.  Normal EKG per EMS.      Review of Systems   Review of Systems   Constitutional: Negative for chills, fever and weight loss.   HENT: Negative for ear pain.    Eyes: Negative for double vision.   Respiratory: Negative for cough.    Cardiovascular: Negative for chest pain and palpitations.   Gastrointestinal: Positive for abdominal pain. Negative for blood in stool, constipation, diarrhea, heartburn, melena, nausea and vomiting.        Tenesmus per history of present illness   Genitourinary: Negative for dysuria, frequency and urgency.   Musculoskeletal: Negative for myalgias.   Skin: Negative for rash.   Neurological: Negative for dizziness, tingling and headaches.   Endo/Heme/Allergies: Bruises/bleeds easily.   Psychiatric/Behavioral: Negative for depression.       Past Medical/Surgical History     Past Medical History:   Diagnosis Date   ??? Acute hypercapnic respiratory failure (HCC) 02/05/2013    W/ Encephalopathy requiring BiPAP, Etiology Uncertain however Possible Narcotic Benzodiazipine Related?     ??? Adjustment disorder with mixed anxiety and depressed mood    ??? Aneurysm of internal carotid artery    ??? Arthritis of knee    ??? Asthma    ??? Autoimmune disease (HCC)    ??? CAD (coronary artery disease)    ??? Cervicalgia    ??? Chest pain 03/17/2014    Dobutamine Stress Echo: NORMAL WALL MOTION AND GLOBAL SYSTOLIC FUNCTION AT REST AND AFTER?? STRESS/EXERCISE WITH APPROPRIATE AUGMENTATION OF FUNCTION IN ALL SEGMENTS. NO EVIDENCE OF INDUCIBLE ISCHEMIA AT ADEQUATE HEART RATE WITH DOBUTAMINE STRESS.??            ??? Chronic low back pain 10/22/2014    [facility] MRI Lumbar w/o contrast: Moderate facet arthropathy at L4-L5 and L5-S1. Mild/moderate foraminal stenosis at these levels. Disc bulge with annular tear at L4-L5.    ??? Chronic pain syndrome    ??? Coccydynia    ??? Diabetes mellitus     NIDDM   ??? DVT (deep venous thrombosis) (HCC) 05/19/2007    [facility] CTA: 1. Small nonocclusive pulmonary embolus in a subsegmental branch to the left lower lobe. Larger filling defect in a segmental branch to the right lower lobe, not as low in attenuation as is normally seen with a pulmonary embolus, however is seen in multiple planes and remains concerning for an additional nonocclusive pulmonary embolus.   ??? Fibromyalgia    ??? Gastric ulcer    ??? Gastritis 01/03/2015  [facility] Admission, EGD + H. Pylori/Gastritis   ??? Generalized osteoarthritis of multiple sites    ??? GI bleed 01/01/2015    [facility] Admit 5/2-01/07/2015   ??? H. pylori infection 01/03/2015    [facility] Admission, EGD + H. Pylori/Gastritis   ??? Hemiparesis, right (HCC)    ??? Hyperlipidemia    ??? Hypertension    ??? Irregular sleep-wake rhythm    ??? Knee joint pain    ??? Lumbar spondylosis     Orthopedic Spine Surgeon: Dr. Ree Kida L. Siegel    ??? Lupus    ??? Muscle spasm    ??? Normocytic anemia     Chronic Iron Deficiency Anemia   ??? Osteoporosis    ??? Pancreatitis    ??? Peripheral neuropathy    ??? Pulmonary embolism (HCC)     Multiple   ??? Seizure (HCC)    ??? Seizures (HCC)    ??? Sensory ataxia     ??? SLE (systemic lupus erythematosus) (HCC)    ??? Stroke [facility])    ??? Subclinical hypothyroidism    ??? Vitamin D deficiency      Past Surgical History:   Procedure Laterality Date   ??? CARDIAC SURG PROCEDURE UNLIST      Cardiac Cath PCI w/ Stents   ??? HX CHOLECYSTECTOMY     ??? HX GI      Partial Gastrectomy    ??? HX GI  01/03/2015    [facility] EGD by Dr. Smitty Cords D. Waldholtz: S/P B-2 Gastric Resection, Gastritis in small remnant, Bx of Jejunum r/o Celiac Disease and of Stomach. + Gastritis + H. Pylori   ??? HX HYSTERECTOMY         Social History     Social History     Social History   ??? Marital status: DIVORCED     Spouse name: N/A   ??? Number of children: N/A   ??? Years of education: N/A     Occupational History   ??? Not on file.     Social History Main Topics   ??? Smoking status: Former Smoker   ??? Smokeless tobacco: Never Used   ??? Alcohol use No   ??? Drug use: No   ??? Sexual activity: Not Currently     Partners: Male     Other Topics Concern   ??? Not on file     Social History Narrative       Family History     Family History   Problem Relation Age of Onset   ??? Diabetes Maternal Grandmother        Current Medications     Prior to Admission Medications   Prescriptions Last Dose Informant Patient Reported? Taking?   BUSPIRONE HCL (BUSPAR PO)   Yes No   Sig: Take  by mouth two (2) times a day.   Cholecalciferol, Vitamin D3, (VITAMIN D) 1,000 unit Cap   Yes No   Sig: Take 50,000 Units by mouth every seven (7) days.   amitriptyline (ELAVIL) 50 mg tablet   Yes No   Sig: Take 50 mg by mouth nightly.   apixaban (ELIQUIS) 5 mg tablet   No No   Sig: Take 1 Tab by mouth every twelve (12) hours.   ascorbic acid, vitamin C, (VITAMIN C) 250 mg tablet   No No   Sig: Take 1 Tab by mouth two (2) times a day. Take with iron pill   lamoTRIgine (LAMICTAL) 100 mg tablet   No No   Sig:  Take 1 Tab by mouth two (2) times a day.   ondansetron hcl (ZOFRAN, AS HYDROCHLORIDE,) 4 mg tablet   No No    Sig: Take 1 Tab by mouth every eight (8) hours as needed for Nausea.   oxyCODONE IR (OXY-IR) 15 mg immediate release tablet   No No   Sig: Take 1 Tab by mouth four (4) times daily. Max Daily Amount: 60 mg.   pantoprazole (PROTONIX) 40 mg tablet   No No   Sig: Take 1 Tab by mouth two (2) times a day.   predniSONE (DELTASONE) 20 mg tablet   No No   Sig: Tale 3 tabs daily for 3 days, then 2 tabs daily for 3 days, then one tab daily for three days by mouth   simvastatin (ZOCOR) 80 mg tablet   Yes No   Sig: Take 80 mg by mouth nightly.   sucralfate (CARAFATE) 100 mg/mL suspension   Yes No   Sig: Take 1 tsp by mouth two (2) times a day.      Facility-Administered Medications: None       Allergies     Allergies   Allergen Reactions   ??? Tape [Adhesive] Itching     Paper tape      ??? Aspirin Not Reported This Time     Because of hx ulcers per patient   ??? Opana [Oxymorphone] Rash and Itching   ??? Tylenol [Acetaminophen] Itching       Physical Exam   ED Triage Vitals   Enc Vitals Group      BP 08/22/16 1622 160/70      Pulse (Heart Rate) 08/22/16 1622 90      Resp Rate 08/22/16 1622 12      Temp 08/22/16 1622 98 ??F (36.7 ??C)      Temp src --       O2 Sat (%) 08/22/16 1622 98 %      Weight 08/22/16 1618 143 lb      Height 08/22/16 1618 5\' 7"       Head Cir --       Peak Flow --       Pain Score --       Pain Loc --       Pain Edu? --       Excl. in GC? --      Physical Exam   Constitutional: She is oriented to person, place, and time. She appears distressed.   HENT:   Head: Normocephalic and atraumatic.   Eyes: EOM are normal. Pupils are equal, round, and reactive to light.   Neck: Normal range of motion.   Cardiovascular: Normal rate and regular rhythm.    Pulmonary/Chest: Effort normal and breath sounds normal.   Abdominal: She exhibits distension and mass. There is tenderness. There is no guarding.   Genitourinary: Rectal exam shows external hemorrhoid and mass. Uterus is enlarged and tender. Vagina exhibits abnormal mucosa.    Genitourinary Comments: Intermittent rectal prolapse with Valsalva.  Bulging posterior vaginal wall through the introitus of vagina with [place].  Multiple non-thrombosed hemorrhoids.   Musculoskeletal: Normal range of motion.   Neurological: She is alert and oriented to person, place, and time.   Skin: Skin is warm and dry.   Psychiatric: Her mood appears anxious.        Impression and Management Plan     Concern for sigmoid volvulus  -Abdominal x-rays  -Pain control  -Routine labs  -Colorectal surgical consult  -  NPO  Diagnostic Studies   Lab:   Recent Results (from the past 12 hour(s))   CBC WITH AUTOMATED DIFF    Collection Time: 08/22/16  5:38 PM   Result Value Ref Range    WBC 7.8 4.0 - 11.0 1000/mm3    RBC 4.10 3.60 - 5.20 M/uL    HGB 10.4 (L) 13.0 - 17.2 gm/dl    HCT 16.133.8 (L) 09.637.0 - 50.0 %    MCV 82.4 80.0 - 98.0 fL    MCH 25.4 25.4 - 34.6 pg    MCHC 30.8 30.0 - 36.0 gm/dl    PLATELET 045340 409140 - 811450 1000/mm3    MPV 10.4 (H) 6.0 - 10.0 fL    RDW-SD 53.1 (H) 36.4 - 46.3      NRBC 0 0 - 0      IMMATURE GRANULOCYTES 0.3 0.0 - 3.0 %    NEUTROPHILS 77.3 (H) 34 - 64 %    LYMPHOCYTES 15.5 (L) 28 - 48 %    MONOCYTES 6.0 1 - 13 %    EOSINOPHILS 0.6 0 - 5 %    BASOPHILS 0.3 0 - 3 %   METABOLIC PANEL, BASIC    Collection Time: 08/22/16  5:38 PM   Result Value Ref Range    Sodium 138 136 - 145 mEq/L    Potassium 4.0 3.5 - 5.1 mEq/L    Chloride 104 98 - 107 mEq/L    CO2 26 21 - 32 mEq/L    Glucose 90 74 - 106 mg/dl    BUN 6 (L) 7 - 25 mg/dl    Creatinine 0.6 0.6 - 1.3 mg/dl    GFR est AA >91.4>60.0      GFR est non-AA >60      Calcium 9.1 8.5 - 10.1 mg/dl   LACTIC ACID    Collection Time: 08/22/16  5:38 PM   Result Value Ref Range    Lactic Acid 1.8 0.4 - 2.0 mmol/L     Labs Reviewed   CBC WITH AUTOMATED DIFF - Abnormal; Notable for the following:        Result Value    HGB 10.4 (*)     HCT 33.8 (*)     MPV 10.4 (*)     RDW-SD 53.1 (*)     NEUTROPHILS 77.3 (*)     LYMPHOCYTES 15.5 (*)      All other components within normal limits   METABOLIC PANEL, BASIC - Abnormal; Notable for the following:     BUN 6 (*)     All other components within normal limits   LACTIC ACID       Imaging:    Xr Abd Acute W 1 V Chest    Result Date: 08/22/2016  Clinical history: Possible sigmoid volvulus, abdominal pain EXAMINATION: Single view of the chest, supine and upright projections of the abdomen 08/22/2016 FINDINGS: Trachea and heart size are within normal limits. Lungs are clear. There is no free air beneath the diaphragm. Scattered air and fecal material is seen in the colon. No dilated loops of small or large bowel. Cholecystectomy clips right upper quadrant     IMPRESSION: 1. No acute pulmonary process. 2. Nonspecific bowel gas pattern.     Ct Abd Pelv W Cont    Result Date: 08/22/2016  Clinical history: Abdominal pain, fever EXAMINATION: CT scan of the abdomen and pelvis with intravenous contrast 08/22/2016. 5 mm spiral scanning is performed from the costophrenic angles to the symphysis pubis.  Coronal and sagittal reconstruction imaging has been obtained. Correlation: 12/26/2015 FINDINGS: Lung bases are clear. Degenerative changes of the spine.  2.3 cm dilatation of the common bile duct not significantly changed from prior study and intrahepatic bile ducts, slight increase in the left intrahepatic bile duct dilatation. Gallbladder is absent. Spleen, pancreas, adrenal glands, left kidney is unremarkable. Subcentimeter right renal hypodensity too small to characterize. Uterus is absent. Foley catheter in the bladder. Because of prominence of the sigmoid colon without surrounding inflammatory changes likely related to underdistention. Terminal ileum, appendix and small bowel loops are within normal limits. Patient is status post gastric bypass surgery. Abdominal aorta demonstrates atherosclerotic calcification. No dominant lymph node enlargement or free fluid.      IMPRESSION: 1. Mild dilatation of the common bile duct and intrahepatic bile ducts. 2. Cholecystectomy, gastric surgery and hysterectomy.       ED Course / Medical Decision Making     76 female presents with tenesmus and concerns for rectal prolapse.  On physical exam, patient's bladder was prolapsing through her vagina in addition to mild rectal prolapse.  Gen. surgery was consulted.  Recommended Foley insertion.  Patient urinated approximately 1 mL while in the department.  Symptoms slowly resolved.  Patient has history of hysterectomy, cholecystectomy and gastric bypass.  Patient's rectal exam demonstrated no fecal matter in the rectal vault.  Patient's symptoms resolved over her time in the emergency department.  CT abdomen and pelvis demonstrated mild dilatation of the common bile duct and intrahepatic bile ducts, remnants of cholecystectomy, gastric surgery and hysterectomy.  No acute process.  At the time of discharge, vital signs are stable.  Patient is afebrile.  BMP unremarkable.  CBC with no sign of leukocytosis.  Lactic acid within normal limits, making bowel obstruction and ischemia unlikely.  Patient expressed desire to be discharged home.    I reviewed any labs, images done while the patient was in the Emergency Department . I discussed behavioral modifications and anticipatory guidance for the patient to be implemented by the patient. Prior to discharge, we provided both verbal and written care instructions. We discussed with the patient the signs and symptoms for which to return to the Emergency Department. All questions were answered and the patient was comfortable with the plan of care and discharge to home. We instructed the patient to follow up with the patient's primary care provider within 2-3-days. The patient verbalized understanding of our discussion and plan of care.    Final Diagnosis       ICD-10-CM ICD-9-CM   1. Tenesmus (rectal) R19.8 787.99   2. Rectal prolapse K62.3 569.1    3. Abdominal pain, generalized R10.84 789.07       Disposition   Home      The patient is discharged home with verbal and written instructions and a referral for ongoing care.  The patient is aware that they may return at any time for new or worsening symptoms and understand their responsibility to do so if there are any concerns.  The patient verbalized understanding of their discharge instructions.   Prescriptions were provided as appropriate.        The patient was personally evaluated by myself and Dr. Adron Bene who agrees with the above assessment and plan.    Ancil Linsey, DO  [facility] [facility]  August 22, 2016    My signature above authenticates this document and my orders, the final ??  diagnosis (es), discharge prescription (s), and instructions in the Epic ??  record.  If you have any questions please contact [phone].  ??  Nursing notes have been reviewed by the physician/ advanced practice ??  Clinician.    Dragon medical dictation software was used for portions of this report. Unintended voice recognition errors may occur.

## 2016-08-22 NOTE — Consults (Signed)
[facility] Consultation Note      Referring Physician: Lily Lovings, PA-C  Outpatient Cardiologist: None   PCP: Dr. Lynnae Sandhoff     Impression:   1. Atypical chest pain, troponin 0.00-0.00-negative x 3   2. H/o chronic chest pain x years  ?? Cardiac cath 2007: Patent coronary arteries, EF 65%  ?? Stress echo 2011: Normal wall motion, normal systolic function.  ?? Echo 11/06/2013: EF 65%   ?? NST 11/07/2013: No ischemia, normal wall motion. EF 72%.   ?? Stress echo July 2015: Normal wall motion, normal systolic function.  ?? Echo 05/20/2015: EF 60%   ?? Echo 07/18/2015: EF 53%  ?? Echo 12/27/2015: EF 60%   ?? NST 12/28/2015: No reversible ischemia. EF 59%.   3. Nausea/vomiting  4. Lupus  5. Fibromyalgia  6. Chronic pain  7. H/o DVT and PE  8. Chronic anticoagulation, eliquis  9. H/o steroid-induced diabetes mellitus   10. GERD  11. Hypertension  12. H/o seizures  13. 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 Patient's pain is highly atypical with negative cardiac enzymes; she has had chronic chest pain for last several years with normal cardiac testing  2. Continue statin; noted allergy to aspirin  3. No further invasive cardiac testing at this time, could consider cardiac CTA if additional imaging warranted    Final recommendations per cardiologist.    [name] [name] is a 65 y.o. female who is being seen on consult for chest pain.    Chief Complaint   Patient presents with   ??? Chest Pain (Angina)   ??? Generalized Body Aches     Admission diagnosis: Chest pain  Patient Active Problem List   Diagnosis Code   ??? Rib pain R07.81   ??? Lupus L93.0   ??? Osteoarthrosis, unspecified whether generalized or localized, unspecified site M19.90   ??? Neuralgia, neuritis, and radiculitis, unspecified IMO0002   ??? Myalgia and myositis, unspecified IMO0001   ??? Pain in limb M79.609   ??? Pain in joint, multiple sites M25.50   ??? Type II or unspecified type diabetes mellitus without mention of  complication, not stated as uncontrolled E11.9   ??? Arthritis of knee M17.10   ??? Encounter for long-term (current) use of other medications Z79.899   ??? Abdominal pain, generalized R10.84   ??? 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 Back pain, lumbosacral M54.5   ??? GI bleed K92.2   ??? Gastrointestinal hemorrhage K92.2   ??? Chest pain R07.9   ??? Acute CVA (cerebrovascular accident) (HCC) I63.9   ??? Pneumoperitoneum K66.8     HPI:  Ms. Benway is a 65 y.o. female with a past medical history of lupus, chronic pain, fibromyalgia, h/o gastric bypass, h/o DVT/PE, chronic anticoagulation (eliquis), GERD, hypertension, and h/o seizures. Ms. Schmale presented to [facility] ED on 12/21 with a chief complaint of chest pain. Patient has had several hospitalizations for chest pain and she states her pain has been occurring for years. She has had several normal stress tests and had a normal cardiac catheterization in 2007. She describes the pain as a tightness in the left side of her chest; she denies radiation of pain. She has associated dyspnea with nausea/vomiting. She had an episode of vomiting while I was at bedside. She states she is weak and cannot do her normal activities such as caring for her grandchildren.     Currently  patient denies active chest pain or pressure, dyspnea, PND, orthopnea, palpitations, syncope, edema, nausea/vomiting, and diaphoresis.     Pertinent diagnostic findings:    Chest X-ray: COPD. Lungs are clear with hyperinflation.     Echocardiogram 12/27/2015: The LV is normal in size with normal systolic function and normal wall thickness. The estimated LV EF is 60%. The RV is normal size with normal systolic function. These are no hemodynamically significant valvular flow abnormalities.     Nuclear stress test 12/28/2015: No reversible ischemia. EF estimated at 59%.      Cardiac catheterization 2007: Patent coronaries, EF 65%      Past Medical History:   Diagnosis Date    ??? Acute hypercapnic respiratory failure (HCC) 02/05/2013    W/ Encephalopathy requiring BiPAP, Etiology Uncertain however Possible Narcotic Benzodiazipine Related?    ??? Adjustment disorder with mixed anxiety and depressed mood    ??? Aneurysm of internal carotid artery    ??? Arthritis of knee    ??? Asthma    ??? Autoimmune disease (HCC)    ??? CAD (coronary artery disease)    ??? Cervicalgia    ??? Chest pain 03/17/2014    Dobutamine Stress Echo: NORMAL WALL MOTION AND GLOBAL SYSTOLIC FUNCTION AT REST AND AFTER?? STRESS/EXERCISE WITH APPROPRIATE AUGMENTATION OF FUNCTION IN ALL SEGMENTS. NO EVIDENCE OF INDUCIBLE ISCHEMIA AT ADEQUATE HEART RATE WITH DOBUTAMINE STRESS.??            ??? Chronic low back pain 10/22/2014    [facility]: Moderate facet arthropathy at L4-L5 and L5-S1. Mild/moderate foraminal stenosis at these levels. Disc bulge with annular tear at L4-L5.    ??? Chronic pain syndrome    ??? Coccydynia    ??? Diabetes mellitus     NIDDM   ??? DVT (deep venous thrombosis) (HCC) 05/19/2007    [facility] CTA: 1. Small nonocclusive pulmonary embolus in a subsegmental branch to the left lower lobe. Larger filling defect in a segmental branch to the right lower lobe, not as low in attenuation as is normally seen with a pulmonary embolus, however is seen in multiple planes and remains concerning for an additional nonocclusive pulmonary embolus.   ??? Fibromyalgia    ??? Gastric ulcer    ??? Gastritis 01/03/2015    [facility] Admission, EGD + H. Pylori/Gastritis   ??? Generalized osteoarthritis of multiple sites    ??? GI bleed 01/01/2015    [facility] Admit 5/2-01/07/2015   ??? H. pylori infection 01/03/2015    [facility] Admission, EGD + H. Pylori/Gastritis   ??? Hemiparesis, right (HCC)    ??? Hyperlipidemia    ??? Hypertension    ??? Irregular sleep-wake rhythm    ??? Knee joint pain    ??? Lumbar spondylosis     Orthopedic Spine Surgeon: Dr. Ree KidaJack L. Siegel    ??? Lupus    ??? Muscle spasm    ??? Normocytic anemia     Chronic Iron Deficiency Anemia   ??? Osteoporosis     ??? Pancreatitis    ??? Peripheral neuropathy    ??? Pulmonary embolism (HCC)     Multiple   ??? Seizure (HCC)    ??? Seizures (HCC)    ??? Sensory ataxia    ??? SLE (systemic lupus erythematosus) (HCC)    ??? Stroke [facility](HCC)    ??? Subclinical hypothyroidism    ??? Vitamin D deficiency      Past Surgical History:   Procedure Laterality Date   ??? CARDIAC SURG PROCEDURE  UNLIST      Cardiac Cath PCI w/ Stents   ??? HX CHOLECYSTECTOMY     ??? HX GI      Partial Gastrectomy    ??? HX GI  01/03/2015    [facility] EGD by Dr. Smitty Cords D. Waldholtz: S/P B-2 Gastric Resection, Gastritis in small remnant, Bx of Jejunum r/o Celiac Disease and of Stomach. + Gastritis + H. Pylori   ??? HX HYSTERECTOMY       Social History     Social History   ??? Marital status: DIVORCED     Spouse name: N/A   ??? Number of children: N/A   ??? Years of education: N/A     Occupational History   ??? Not on file.     Social History Main Topics   ??? Smoking status: Former Smoker   ??? Smokeless tobacco: Never Used   ??? Alcohol use No   ??? Drug use: No   ??? Sexual activity: Not Currently     Partners: Male     Other Topics Concern   ??? Not on file     Social History Narrative     Family History   Problem Relation Age of Onset   ??? Diabetes Maternal Grandmother        Allergies   Allergen Reactions   ??? Tape [Adhesive] Itching     Paper tape      ??? Aspirin Not Reported This Time     Because of hx ulcers per patient   ??? Opana [Oxymorphone] Rash and Itching   ??? Tylenol [Acetaminophen] Itching         Home Medications:     Prior to Admission medications    Medication Sig Start Date End Date Taking? Authorizing Provider   apixaban (ELIQUIS) 5 mg tablet Take 1 Tab by mouth every twelve (12) hours. 08/22/16  Yes Khalid Mehmood, MD   ondansetron hcl (ZOFRAN, AS HYDROCHLORIDE,) 4 mg tablet Take 1 Tab by mouth every eight (8) hours as needed for Nausea. 08/22/16  Yes Khalid Mehmood, MD   amitriptyline (ELAVIL) 50 mg tablet Take 50 mg by mouth nightly.   Yes Historical Provider    sucralfate (CARAFATE) 100 mg/mL suspension Take 1 tsp by mouth two (2) times a day.   Yes Historical Provider   BUSPIRONE HCL (BUSPAR PO) Take  by mouth two (2) times a day.   Yes Phys Other, MD   ascorbic acid, vitamin C, (VITAMIN C) 250 mg tablet Take 1 Tab by mouth two (2) times a day. Take with iron pill 12/29/15  Yes Hazel Sams, MD   lamoTRIgine (LAMICTAL) 100 mg tablet Take 1 Tab by mouth two (2) times a day. 01/07/15  Yes Theodis Aguas, MD   Cholecalciferol, Vitamin D3, (VITAMIN D) 1,000 unit Cap Take 50,000 Units by mouth every seven (7) days.   Yes Historical Provider   predniSONE (DELTASONE) 20 mg tablet Tale 3 tabs daily for 3 days, then 2 tabs daily for 3 days, then one tab daily for three days by mouth 07/14/16   Johny Drilling, MD   pantoprazole (PROTONIX) 40 mg tablet Take 1 Tab by mouth two (2) times a day. 12/29/15   Hazel Sams, MD   oxyCODONE IR (OXY-IR) 15 mg immediate release tablet Take 1 Tab by mouth four (4) times daily. Max Daily Amount: 60 mg. 07/20/15   Faythe Ghee, DO   simvastatin (ZOCOR) 80 mg tablet Take 80 mg by mouth nightly.  Phys Other, MD       Inpt meds:  Current Facility-Administered Medications   Medication Dose Route Frequency   ??? lamoTRIgine (LaMICtal) tablet 100 mg  100 mg Oral BID   ??? oxyCODONE IR (OXY-IR) immediate release tablet 15 mg  15 mg Oral QID   ??? pantoprazole (PROTONIX) tablet 40 mg  40 mg Oral BID   ??? simvastatin (ZOCOR) tablet 80 mg  80 mg Oral QHS   ??? sodium chloride (NS) flush 5-10 mL  5-10 mL IntraVENous Q8H   ??? apixaban (ELIQUIS) tablet 5 mg  5 mg Oral Q12H   ??? sucralfate (CARAFATE) 100 mg/mL oral suspension 0.5 g  0.5 g Oral BID   ??? amitriptyline (ELAVIL) tablet 50 mg  50 mg Oral QHS       Review of Systems:   (Positives in [place])  General: negative for chills, diaphoresis, fever, malaise/ fatigue and weight loss  Chronic disease: negative for h/o thyroid disorder, lupus, rheumatoid arthritis, gout, fibromyalgia   Neurological: negative for h/o CVA, TIA, seizure, tremor, neuropathy, LOC, dizziness, assistive devices (walker, cane, wheelchair), falls in the past 6 months ??  HEENT: negative for congestion, hearing loss, nose bleeds, sore throat, stridor, ongoing or current dental/ENT infections, recent vision changes, tinnitus, sinusitis, or cervical fusion  Cardiovascular: negative for chest pain/pressure, claudication, leg swelling, orthopnea, palpitations, prior atrial fibrillation, previous MI, prior cardiac arrest, CHF, PND, edema, anticoagulant therapy (eliquis), varicose veins, vein stripping, PVD, PAD, carotid disease, AAA, pacemaker, AICD, CAD, hypertension, hyperlipidemia  Pulmonary: negative for cough, hemoptysis, SOB/ DOE, sputum production, wheezing, sleep apnea, remote smoker, asthma, bronchitis, COPD, h/o TB, home O2 use, use of inhalers, industrial exposure (asbestos, silicon, radon, coal), second hand smoke exposure, h/o lung cancer, resection, current smoker  Gastrointestinal: negative for abdominal pain, heartburn, blood in stool, constipation, ulcers, diverticulosis, GERD, h/o GI bleed, nausea and vomiting  Genitourinary: negative for dysuria, hematuria, frequency, urgency, frequent urinary infections, urinary retention, enlarged prostate, urethral strictures ??  Endocrine: negative for h/o renal disease (AKI), diabetes mellitus or thyroid problems ??  Hematologic/lymphatic: negative for?? easy bruising/bleeding, iron or blood transfusions, chronic anemia, cancer, DVT, PE ??  Integument: negative for rash, non-healing sores, skin disorder ??  Musculoskeletal: back pain, joint pain, neck pain  Psych: negative for anxiety, depression, insomnia, suicidal ideals, psychosis or ongoing psychiatric treatment. ??    Physical Assessment:     Visit Vitals   ??? BP 142/69 (BP 1 Location: Right arm, BP Patient Position: Supine)   ??? Pulse 87   ??? Temp 97.8 ??F (36.6 ??C)   ??? Resp 17   ??? Ht 5\' 7"  (1.702 m)   ??? Wt 64.9 kg (143 lb)    ??? SpO2 97%   ??? Breastfeeding No   ??? BMI 22.4 kg/m2       General: Alert and oriented, no acute distress   HEENT: Oral mucosa well perfused; conjunctiva not injected  Neck: No JVD, supple  Respiratory: Clear to auscultation bilaterally; No wheezes or rales  Cardiovascular: regular rhythm normal s1and s2; No murmurs or rubs  Abdomen: Soft, Nontender; ND  Extremities: No clubbing, cyanosis or edema  Neuro: Alert and oriented, moves all four extremities  Musculoskeletal: Moving all extremities, no joint tenderness, swelling, or edema  Skin: Warm, Dry, Intact    Labs:  CBC w/Diff  Lab Results   Component Value Date/Time    WBC 6.1 08/22/2016 12:58 AM    RBC 3.68 08/22/2016 12:58 AM    HCT  30.0 (L) 08/22/2016 12:58 AM    MCV 81.5 08/22/2016 12:58 AM    MCH 25.8 08/22/2016 12:58 AM    MCHC 31.7 08/22/2016 12:58 AM    RDW 23.5 (H) 02/26/2015 08:20 PM     Lab Results   Component Value Date/Time    MONOS 8.0 08/22/2016 12:58 AM    EOS 3.3 08/22/2016 12:58 AM    BASOS 1.0 08/22/2016 12:58 AM       Basic Metabolic Profile  Lab Results   Component Value Date    NA 145 08/22/2016    CO2 26 08/22/2016    BUN 10 08/22/2016       Cardiac Enzymes  Lab Results   Component Value Date    CPK 178 02/27/2015    CKMB 1.6 02/27/2015         Thyroid Studies  Lab Results   Component Value Date/Time    TSH 0.955 12/27/2015 02:44 AM     No components found for: T3, FREET3, T3UPTAKE, THYROXINBDGL    Coagulation  Lab Results   Component Value Date    INR 2.7 (H) 12/29/2015       Lab Results   Component Value Date/Time    HDL 70 07/18/2015 06:03 AM           Kandy Garrison, PA-C  [facility]   December 22, 201712:46 PM  Pager [phone]

## 2016-08-22 NOTE — ED Notes (Signed)
8:38 PM  08/22/16     Discharge instructions given to pt (name) with verbalization of understanding. Patient accompanied by daughter.  Patient discharged with the following prescriptions NONE. Patient discharged to home (destination).      Marlowe Kaysonia L Meade, RN

## 2016-08-22 NOTE — ED Triage Notes (Addendum)
Patient discharged earlier today from hospital.  Brought in from home.  C/O  mass--possible hemorrhoids  C/O rectal pressure--  Accucheck 96

## 2016-08-22 NOTE — Progress Notes (Signed)
[facility] declined for home health orders to go to [facility].

## 2016-08-22 NOTE — Discharge Summary (Signed)
DISCHARGE SUMMARY     Patient Name: [name] [name]  Medical Record Number: [id number]  Date of Birth: 07-28-1951  Discharge Provider: Lawana Chambers, MD  Primary Care Provider: Lynnae Sandhoff, MD    Admit date: 08/21/2016  Discharge Date:  September 06, 2016 Time: 3:14 PM  Discharge Disposition: Home  Code Status: Full Code    Follow-up appointments:     Follow-up Information     Follow up With Details Comments Contact Info    Lynnae Sandhoff, MD Schedule an appointment as soon as possible for a visit in 3 days PATIENT WILL CALL AND SET UP APPOINTMENT 3-5 DAYS [address]  [address]  [place] [address]  [phone]      [facility] [address], [place] [address]  [phone]           Follow-up recommendations:   PCP follow-up 3-5 days  Discharge diagnosis:  1. Primary diagnosis           Chest pain                    Hospital Problems as of 06-Sep-2016  Date Reviewed: September 06, 2016          Codes Class Noted - Resolved POA    * (Principal)Chest pain ICD-10-CM: R07.9  ICD-9-CM: 786.50  05/19/2015 - Present Unknown                                            Past Medical History:   Diagnosis Date   ??? Acute hypercapnic respiratory failure (HCC) 02/05/2013    W/ Encephalopathy requiring BiPAP, Etiology Uncertain however Possible Narcotic Benzodiazipine Related?    ??? Adjustment disorder with mixed anxiety and depressed mood    ??? Aneurysm of internal carotid artery    ??? Arthritis of knee    ??? Asthma    ??? Autoimmune disease (HCC)    ??? CAD (coronary artery disease)    ??? Cervicalgia    ??? Chest pain 03/17/2014    Dobutamine Stress Echo: NORMAL WALL MOTION AND GLOBAL SYSTOLIC FUNCTION AT REST AND AFTER?? STRESS/EXERCISE WITH APPROPRIATE AUGMENTATION OF FUNCTION IN ALL SEGMENTS. NO EVIDENCE OF INDUCIBLE ISCHEMIA AT ADEQUATE HEART RATE WITH DOBUTAMINE STRESS.??            ??? Chronic low back pain 10/22/2014    [facility] MRI Lumbar w/o contrast: Moderate facet arthropathy at L4-L5 and  L5-S1. Mild/moderate foraminal stenosis at these levels. Disc bulge with annular tear at L4-L5.    ??? Chronic pain syndrome    ??? Coccydynia    ??? Diabetes mellitus     NIDDM   ??? DVT (deep venous thrombosis) (HCC) 05/19/2007    [facility] CTA: 1. Small nonocclusive pulmonary embolus in a subsegmental branch to the left lower lobe. Larger filling defect in a segmental branch to the right lower lobe, not as low in attenuation as is normally seen with a pulmonary embolus, however is seen in multiple planes and remains concerning for an additional nonocclusive pulmonary embolus.   ??? Fibromyalgia    ??? Gastric ulcer    ??? Gastritis 01/03/2015    [facility] Admission, EGD + H. Pylori/Gastritis   ??? Generalized osteoarthritis of multiple sites    ??? GI bleed 01/01/2015    [facility] Admit 5/2-01/07/2015   ??? H. pylori infection 01/03/2015  [facility] Admission, EGD + H. Pylori/Gastritis   ??? Hemiparesis, right (HCC)    ??? Hyperlipidemia    ??? Hypertension    ??? Irregular sleep-wake rhythm    ??? Knee joint pain    ??? Lumbar spondylosis     Orthopedic Spine Surgeon: Dr. Ree KidaJack L. Siegel    ??? Lupus    ??? Muscle spasm    ??? Normocytic anemia     Chronic Iron Deficiency Anemia   ??? Osteoporosis    ??? Pancreatitis    ??? Peripheral neuropathy    ??? Pulmonary embolism (HCC)     Multiple   ??? Seizure (HCC)    ??? Seizures (HCC)    ??? Sensory ataxia    ??? SLE (systemic lupus erythematosus) (HCC)    ??? Stroke [facility](HCC)    ??? Subclinical hypothyroidism    ??? Vitamin D deficiency                                                         Discharge Medications:                 Discharge Medication List as of 08/22/2016 12:23 PM      START taking these medications    Details   ondansetron hcl (ZOFRAN, AS HYDROCHLORIDE,) 4 mg tablet Take 1 Tab by mouth every eight (8) hours as needed for Nausea., Normal, Disp-15 Tab, R-0         CONTINUE these medications which have CHANGED    Details   apixaban (ELIQUIS) 5 mg tablet Take 1 Tab by mouth every twelve (12) hours., No Print, Disp-30 Tab, R-0          CONTINUE these medications which have NOT CHANGED    Details   amitriptyline (ELAVIL) 50 mg tablet Take 50 mg by mouth nightly., Historical Med      sucralfate (CARAFATE) 100 mg/mL suspension Take 1 tsp by mouth two (2) times a day., Historical Med      BUSPIRONE HCL (BUSPAR PO) Take  by mouth two (2) times a day., Historical Med      ascorbic acid, vitamin C, (VITAMIN C) 250 mg tablet Take 1 Tab by mouth two (2) times a day. Take with iron pill, Normal, Disp-60 Tab, R-3      lamoTRIgine (LAMICTAL) 100 mg tablet Take 1 Tab by mouth two (2) times a day., Print, Disp-60 Tab, R-0      Cholecalciferol, Vitamin D3, (VITAMIN D) 1,000 unit Cap Take 50,000 Units by mouth every seven (7) days., Historical Med      predniSONE (DELTASONE) 20 mg tablet Tale 3 tabs daily for 3 days, then 2 tabs daily for 3 days, then one tab daily for three days by mouth, Print, Disp-18 Tab, R-0      pantoprazole (PROTONIX) 40 mg tablet Take 1 Tab by mouth two (2) times a day., Normal, Disp-60 Tab, R-3      oxyCODONE IR (OXY-IR) 15 mg immediate release tablet Take 1 Tab by mouth four (4) times daily. Max Daily Amount: 60 mg., Print, Disp-10 Tab, R-0      simvastatin (ZOCOR) 80 mg tablet Take 80 mg by mouth nightly., Historical Med             Discharge Diet:  Diet: Cardiac Diet    Consultants: Cardiology    Procedures:   * No surgery found *        Most Recent BMP and CBC:    Recent Labs      08/22/16   0058   BUN  10   NA  145   CO2  26     Recent Labs      08/22/16   0058   WBC  6.1   RBC  3.68   HCT  30.0*   MCV  81.5   MCH  25.8   MCHC  31.7       Imaging:    Xr Chest Pa Lat    Result Date: 08/21/2016  Clinical history: Chest pain EXAMINATION: PA and lateral views of the chest 08/21/2016 Correlation: 07/14/2016 FINDINGS: Trachea and heart size are within normal limits. Lungs are clear. There is hyperinflation.     IMPRESSION: COPD.     Xr Foot Rt Min 3 V    Result Date: 08/08/2016   Right foot routine 3 views Reason for exam: Injury No fracture, acute bone joint process or significant abnormal findings.     IMPRESSION: Prior amputation distal phalanx great toe. Otherwise normal study. Unchanged 05/09/2016.     Ct Maxillofacial Wo Cont    Result Date: 08/08/2016  Indication: Right-sided facial pain when eating.     IMPRESSION: No significant abnormality seen. Degenerative changes in cervical spine. Comment: CT images of the facial bones was obtained without intravenous contrast. The sinuses are pneumatized. The orbits are intact. The visualized brain is unremarkable. The airways are intact. No adenopathy. The salivary glands are intact. No fracture or dislocation. Chronic discogenic disease is noted in the cervical spine with mild disc space narrowing. There is more advanced chronic discogenic disease at C6-7 mild neural foramen encroachment.       Echo Results  (Last 48 hours)    None          History of presenting illness:( Per admitting M.D.)  [name] [name] is a 65 y.o. BLACK OR [place] female who presents with chest pain and generalized body aches  She said the pain has been constant fluttering intensity described reported heaviness worse with deep palpation and taking a breath  She has history of PE and is on Eliquis for the same  She has history of fibromyalgia or lupus and seizures    Hospital course:   Atypical chest pain likely GERD.  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  Patient kept on telemetry.  Serial cardiac markers were ordered.  She was continued on home medications.  EKG showed no ischemic change.  Cardiology she was consulted. patient was clinically improved.  Troponins were negative.  Chest pain was resolved.  Patient previous records  reviewed.  Patient had stress test in Apri 2017 was negative.  Her echo was also normal.  Cardiology recommended continuing medical management  Once patient was ruled out for acute coronary syndrome she is being discharged home   recommended to return to hospital if any worsening chest pain or shortness of breath    Vitals on Discharge:  Visit Vitals   ??? BP 142/69 (BP 1 Location: Right arm, BP Patient Position: Supine)   ??? Pulse 87   ???  Temp 97.8 ??F (36.6 ??C)   ??? Resp 17   ??? Ht 5\' 7"  (1.702 m)   ??? Wt 64.9 kg (143 lb)   ??? SpO2 97%   ??? Breastfeeding No   ??? BMI 22.4 kg/m2       Physical Exam:  Constitutional:??Awake and alert, NAD  HENT:??atraumatic, normocephalic, oropharynx clear ??  Eyes:??????conjunctiva normal  Neck:??????supple and trachea normal  Cardiovascular:?? Regular rate and rhythm, heart sounds normal, intact distal pulses  Pulmonary/Chest Wall: breath sounds normal and effort normal  Abdominal:????????????appearance normal, soft, non-tender upon palpation,bowel sounds normal.  Neurological:??????awake, alert and oriented, CN intact, no focal neuro deficits, moves all extremities equally .  Extremities:???? No clubbing or cyanosis. Pedal pulses 2+ b/l.       Discharge condition:  improved    Discharge activity and restrictions: Activity as tolerated    Time spent with discharging patient:   Discharge plan discussed with pt. All questions answered. Need for compliance with medicine and follow up emphasized. Pt verbalized the understanding of care. Time spent 35 minutes.      Lawana ChambersKhalid Abia Monaco, MD  08/22/2016  3:14 PM    Copies: Lynnae SandhoffJodi Newcombe, MD

## 2016-08-22 NOTE — Consults (Signed)
[facility]  [facility]  NAME:  [name] [name], [name] [name]  SEX:   F  ADMIT: 08/22/2016  DATE OF CONSULT: 08/22/2016  REFERRING PHYSICIAN:    DOB: November 17, 1950  MR#    [id number]  ROOM:  [id number]  ACCT#  [id number]    cc: [facility] , Victorino DikeJennifer Himmel-Salch DO, Lynnae SandhoffJodi Newcombe MD    Lynnae SandhoffJodi Newcombe, MD  [facility]  [address]  [place], [place] [id number]  [phone]     SURGICAL CONSULTATION    DATE OF CONSULTATION:  08/22/2016    REQUESTING PHYSICIANS:  Dr. Marina GoodellPerry and Dr. Victorino DikeJennifer Himmel-Salch, Emergency Department.    REASON FOR CONSULTATION:  Rectal prolapse.    HISTORY OF PRESENT ILLNESS:  The patient is a 65 year old female brought to the emergency room via EMS after discharge earlier this morning for noncardiac chest pain.  She reports the sensation of the need for a bowel movement and significant pelvic pressure and has been straining constantly without the ability to pass bowel movement.  She feels like she is voiding okay.  She denies chronic trouble with constipation.  According to records, she had an essentially normal colonoscopy in 2015, by Dr. Katrinka BlazingSmith.  She denies nausea but acknowledges deep low pelvic discomfort.  She feels like there is something pushing out of her rectum.    REVIEW OF REVIEW OF SYSTEMS:  The patient is a poor historian.  A full 12-point review of systems was completed, but uncertain as to accuracy.  Pertinent positives and negatives as in HPI.  In addition, she denies fevers, chills, nausea, vomiting, chest pain and shortness of breath.  She also denies dysuria.    PAST MEDICAL HISTORY:  1.  Systemic lupus erythematosus.  2.  Seizures.  3.  History of pulmonary embolism.  4.  Peripheral neuropathy.  5.  History of pancreatitis.  6.  Hypertension.  7.  Hyperlipidemia.  8.  History of gastric ulcer, status post gastrectomy.  9.  History of H. pylori and recurrent gastritis.  10.  History of DVT.  11.  Diabetes.   12.  Chronic pain syndrome, fibromyalgia, chronic low back pain, cervicalgia.  13.  Coronary artery disease.  14.  Asthma.  15.  History of encephalopathy.  16.  History of stroke.    PAST SURGICAL HISTORY:  1.  Partial gastrectomy for ulcer disease  2.  Cardiac catheterization with stent placement.  3.  History of hysterectomy.  4.  History of cholecystectomy.    OUTPATIENT MEDICATIONS:  Buspirone, vitamin D, Elavil, Eliquis, vitamin C, Lamictal, Zofran, oxycodone, Protonix, Zocor, Carafate.    ALLERGIES:  ASPIRIN, OPANA, TYLENOL, PAPER TAPE.    PHYSICAL EXAMINATION:  GENERAL:  Thin, chronically ill-appearing female lying on the stretcher in mild distress.  VITAL SIGNS:  Heart rate 90.  Blood pressure 160/70, respirations 12, temperature 98 degrees Fahrenheit, oxygen saturation 98% on room air, height 5 feet 7, weight 143 pounds.  HEAD:  Normocephalic, atraumatic.  EYES:  No scleral icterus.    MOUTH:  Mucous membranes moist.  No oral ulcers.  NECK:  Supple.  Trachea midline.  No jugular venous distention.  LUNGS:  Clear to auscultation bilaterally.  HEART:  Regular rate and rhythm.  ABDOMEN:  Soft, nondistended.  Mild diffuse lower abdominal tenderness, nonspecific, no rebound or guarding.  Active straining and use of the rectus muscles throughout exam.  No palpable mass.    PELVIC/RECTAL:  Large cystocele protruding to the introitus.  Mild to moderate  combined hemorrhoids.  Intermittent slight mucosal prolapse with Valsalva.  No rectocele.  No full thickness rectal prolapse.  Rectal vault is empty.  Anal tone normal.    LABORATORIES:  White blood count 7.8, hemoglobin and hematocrit 10 and 33.8, platelets 340, 77% neutrophils.  Electrolytes normal with a BUN and creatinine of 6 and 0.6.  Lactic acid of 1.8.    IMAGING:  Acute abdominal series with nonspecific bowel gas pattern.  CT scan after Foley placement with mild dilation of the common bile duct and  intrahepatic bile ducts, no change.  Evidence of prior cholecystectomy, gastrectomy and hysterectomy.  No bowel abnormalities noted on my review.    IMPRESSION:  Tenesmus, likely due to urinary retention, with presence of large full cystocele noted.  Symptoms resolved completely after placement of Foley catheter with initial drainage of 300 mL of urine, but within 20 minutes over a liter of urine.  The patient reports her symptoms resolved completely after placement of Foley catheter.  She has moderate hemorrhoidal swelling and very mild rectal mucosal prolapse related to the extensive straining from tenesmus.  The tenesmus ceased after her bladder was drained.  Review of CT scan from April of this year revealed a markedly distended bladder.  This may be a chronic problem.  She may benefit from urology workup as an outpatient with urodynamic testing.  No need for colorectal followup.    Thank you very much for involving me in the care of this patient.        ___________________  Brantley PersonsBeth R Shahara Hartsfield MD  Dictated By:.   MN  D:08/22/2016 21:53:52  T: 08/23/2016 05:15:46  [id number]

## 2016-08-22 NOTE — Progress Notes (Signed)
Problem: Falls - Risk of  Goal: *Absence of Falls  Document Schmid Fall Risk and appropriate interventions in the flowsheet.   Outcome: Progressing Towards Goal  Fall Risk Interventions:  Mobility Interventions: Bed/chair exit alarm, OT consult for ADLs, Patient to call before getting OOB, PT Consult for mobility concerns, PT Consult for assist device competence         Medication Interventions: Evaluate medications/consider consulting pharmacy, Patient to call before getting OOB, Teach patient to arise slowly    Elimination Interventions: Bed/chair exit alarm, Call light in reach, Patient to call for help with toileting needs

## 2016-08-22 NOTE — Progress Notes (Signed)
[facility]  [facility] En[facility]    Patient???s Name: [name] CourseVeronica E [name]           Date of Birth: 07/01/1951    Primary Diagnosis: Chest pain                    Admit Date: 08/21/2016    Date of Face to Face:  August 22, 2016                    Medical Record Number: [id number]     Attending: Lawana ChambersKhalid Mehmood, MD      Physician Attestation:  To be filled out by physician who conducted Face-to Face encounter.    Current Problem List:  The encounter with the patient was in whole, or in part, for the following medical condition, which is the primary reason home health care (list medical condition):    Patient Active Hospital Problem List:   Chest pain (05/19/2015)                            Face to Face Encounter findings are related to primary reason for home care:   yes.     1. I certify that the patient needs intermittent care as follows: skilled nursing care:  skilled observation/assessment, patient education, complex care plan management and administration of medications  physical therapy: strengthening, stretching/ROM, transfer training, gait/stair training, balance training and pt/caregiver education  occupational therapy:  ADL safety (ie. cooking, bathing, dressing), ROM and pt/caregiver education    2. I certify that this patient is homebound, that is: 1) patient requires the use of a walker device, special transportation, or assistance of another to leave the home; or 2) patient's condition makes leaving the home medically contraindicated; and 3) patient has a normal inability to leave the home and leaving the home requires considerable and taxing effort.  Patient may leave the home for infrequent and short duration for medical reasons, and occasional absences for non-medical reasons. Homebound status is due to the following functional limitations: Patient with strength deficits limiting the performance of all ADL's without caregiver assistance or the use of an assistive device.   Patient with poor safety awareness and is at risk for falls without assistance of another person and the use of an assistive device.  Patient with poor ambulation endurance limiting their safe ability to ascend/descend the required number of steps to leave the home.    3. I certify that this patient is under my care and that I, or a nurse practitioner or physician???s assistant, or clinical nurse specialist, or certified nurse midwife, working with me, had a Face-to-Face Encounter that meets the physician Face-to-Face Encounter requirements.  The following are the clinical findings from the Face-to-Face encounter that support the need for skilled services and is a summary of the encounter:      See discharge summary    Electronically signed:  Inis SizerDonna S Bethea, RN  08/22/2016      THE FOLLOWING TO BE COMPLETED BY THE  PHYSICIAN:    I concur with the findings described above from the F2F encounter that this patient is homebound and in need of a skilled service.    Electronically signed:  Certifying Physician:

## 2016-08-22 NOTE — Progress Notes (Signed)
Arch wireless to Dr.Mehmood: CD27 [name] [name], [name] [name]; patient has allergy to tape and is requesting to be d/c'd from tele monitor due to lead pads causing her to itch. Cardiac rhythm; normal sinus HR 81 Thank you -Patrice,RN [phone]

## 2016-08-22 NOTE — Progress Notes (Addendum)
Patient admitted on 08/21/2016 from Home with   Chief Complaint   Patient presents with   ??? Chest Pain (Angina)   ??? Generalized Body Aches        The patient has been admitted to the hospital 2 times in the past 12 months.    Tentative dc plan:  Home with home health-[facility]. Patient lives with daughters and granddaughters. PT/OT evals pending.    Facility if plan no    Anticipated Discharge Date: 12/22-12/23    PCP: Lynnae SandhoffJodi Newcombe, MD     Specialists:     Pain management    Face sheet information, address, contact info and insurance verified yes    Dialysis Unit/ chair time / access:    no    Pharmacy:     [facility]  Any issues getting medications no    DME:    Dan HumphreysWalker, nebulizer    Home Environment:    Lives at [facility] [employer]  [place] [id number]    [phone].     Prior to admission open services:     no    Home Health Agency-     no    Personal Care Agency-    no    Extended Emergency Contact Information  Primary Emergency Contact: Kerby LessEdwards,Gariel Lettrel  Address: [address]           [place], [place] [id number] [place]  Home Phone: [phone]  Relation: Daughter      Transportation:     Daughter will transport home    Therapy Recommendations:  OT :y/n     pending  PT :y/n     pending  SLP :y/n    no    RT Home O2 Evaluation :y/n     no    Wound Care: y/n     no    Change consult (formerly [facility]) :  No-patient has medicaid cm contacted ed reg to update    Case Management Assessment    ABUSE/NEGLECT SCREENING   Physical Abuse/Neglect: Denies   Sexual Abuse: Denies   Sexual Abuse: Denies   Other Abuse/Issues: Denies          PRIMARY DECISION MAKER   Primary Decision Maker Name: Heywood BeneJonetta Barresi   Primary Decision Maker Phone Number: [phone]       Primary Decision Maker Relationship to Patient: Adult child                   CARE MANAGEMENT INTERVENTIONS   Readmission Interview Completed: Not Applicable   PCP Verified by CM: Yes            Mode of Transport at Discharge: Self       Transition of Care Consult (CM Consult): Discharge Planning                   Physical Therapy Consult: Yes   Occupational Therapy Consult: Yes       Current Support Network: Relative's Home   Reason for Referral: DCP Rounds   History Provided By: Patient   Patient Orientation: Alert and Oriented   Cognition: Alert   Support System Response: Unavailable   Previous Living Arrangement: Lives with Family Independent       Prior Functional Level: Assistance with the following:, Mobility   Current Functional Level: Assistance with the following:, Mobility   Primary Language: English   Can patient return to prior living arrangement: Yes   Ability to make needs known::  Fair   Family able to assist with home care needs:: Yes               Types of Needs Identified: Disease Management Education, Treatment Education, Emotional Support   Anticipated Discharge Needs: [facility]   Confirm Follow Up Transport: Family       Plan discussed with Pt/Family/Caregiver: Yes   Freedom of Choice Offered: Yes      DISCHARGE LOCATION   Discharge Placement: Home with home health

## 2016-08-24 LAB — EKG, 12 LEAD, INITIAL
Atrial Rate: 88 {beats}/min
Calculated P Axis: 54 degrees
Calculated R Axis: 75 degrees
Calculated T Axis: 53 degrees
Diagnosis: NORMAL
P-R Interval: 112 ms
Q-T Interval: 366 ms
QRS Duration: 78 ms
QTC Calculation (Bezet): 442 ms
Ventricular Rate: 88 {beats}/min

## 2016-08-24 LAB — EKG 12-LEAD
Atrial Rate: 88 {beats}/min
Diagnosis: NORMAL
P Axis: 54 degrees
P-R Interval: 112 ms
Q-T Interval: 366 ms
QRS Duration: 78 ms
QTc Calculation (Bazett): 442 ms
R Axis: 75 degrees
T Axis: 53 degrees
Ventricular Rate: 88 {beats}/min

## 2017-02-18 ENCOUNTER — Inpatient Hospital Stay
Admit: 2017-02-18 | Discharge: 2017-02-19 | Disposition: A | Discharge status: Home Health | Payer: PRIVATE HEALTH INSURANCE | Attending: Internal Medicine

## 2017-02-18 ENCOUNTER — Emergency Department: Admit: 2017-02-19 | Payer: PRIVATE HEALTH INSURANCE | Primary: Internal Medicine

## 2017-02-18 ENCOUNTER — Emergency Department: Admit: 2017-02-18 | Payer: PRIVATE HEALTH INSURANCE | Primary: Internal Medicine

## 2017-02-18 DIAGNOSIS — R55 Syncope and collapse: Secondary | ICD-10-CM

## 2017-02-18 MED ORDER — OXYCODONE 5 MG TAB
5 mg | ORAL | Status: AC
Start: 2017-02-18 — End: 2017-02-18
  Administered 2017-02-18: via ORAL

## 2017-02-18 MED ORDER — SODIUM CHLORIDE 0.9 % IJ SYRG
Freq: Once | INTRAMUSCULAR | Status: AC
Start: 2017-02-18 — End: 2017-02-18
  Administered 2017-02-19: 02:00:00 via INTRAVENOUS

## 2017-02-18 MED ORDER — SODIUM CHLORIDE 0.9% BOLUS IV
0.9 % | INTRAVENOUS | Status: AC
Start: 2017-02-18 — End: 2017-02-19
  Administered 2017-02-19: 02:00:00 via INTRAVENOUS

## 2017-02-18 MED FILL — OXYCODONE 5 MG TAB: 5 mg | ORAL | Qty: 1

## 2017-02-18 NOTE — ED Notes (Signed)
Pt at home fell today at home. Daughter says Pt has been falling a lot. Pt fell to her left side and hurt her back and has left flank pain . Pt called EMS. Pt has Hx of chronic back pain and chronic all over pain in general. EMS arrived to find Pt awake alert with stable VS. Pt was back boarded and had a C Collar put on. Pt's BS was 130. Pt had stable VS and was transported to the ED.

## 2017-02-18 NOTE — Other (Signed)
TRANSFER - OUT REPORT:    Verbal report given to Jesel (name) on Clemencia CourseVeronica E Melman  being transferred to PCU (unit) for routine progression of care       Report consisted of patient???s Situation, Background, Assessment and   Recommendations(SBAR).     Information from the following report(s) SBAR was reviewed with the receiving nurse.    Lines:   Peripheral IV 02/18/17 Right;Upper  (Active)        Opportunity for questions and clarification was provided.      Patient transported with:   Registered Nurse

## 2017-02-18 NOTE — ED Provider Notes (Signed)
[facility]  Emergency Department Treatment Report      Patient: [name] CourseVeronica E [name] Age: 66 y.o. Sex: female    Date of Birth: 01/19/1951 Admit Date: 02/18/2017 PCP: [name] SandhoffJodi Newcombe, MD   MRN: [id number]  CSN: [id number]     Room: ER28/ER28 Time Dictated: 7:29 PM      Attending MD: Candace CruiseEMILY A HAHN, MD  APC:  Vonna KotykKristen K. Cinzia Devos, NP-C    Chief Complaint   Chief Complaint   Patient presents with   ??? Fall   ??? Back Pain   ??? Flank Pain     left       History of Present Illness   66 y.o. female brought to the emergency department by EMS after she had a fall at home.  Patient states that she got lightheaded and fell to the ground she is unsure if she lost consciousness or not but does not remember the fall itself.  She states that she is having pain over her left posterior and anterior ribs, right hip, she has not been able to ambulate since her fall.  She is also having complaint of back pain but has a history of chronic back pain.  Patient states that she has fallen approximately 6 times at home in the past week and a half but has not seek treatment for it.  She feels that she has similar symptoms of lightheadedness prior to each fall.  Patient has a history of seizures, but is not post ictal.  Patient states she is on Eliquis   For history of DVT and PE.    Review of Systems   Constitutional: Lightheadedness No fever or chills  Eyes: No visual symptoms, eye pain or redness.   ENT: No sore throat, difficulty swallowing, runny nose or ear pain.  Respiratory: No cough, dyspnea or wheezing.  Cardiovascular: No chest pain or palpitations.   Gastrointestinal: No vomiting, diarrhea or abdominal pain.  Musculoskeletal: Left rib, right hip, low back pain  Integumentary: No rashes or wounds.  Neurological: No headaches or sensory motor symptoms  Denies complaints in all other systems.    Past Medical/Surgical History     Past Medical History:   Diagnosis Date   ??? Acute hypercapnic respiratory failure (HCC) 02/05/2013     W/ Encephalopathy requiring BiPAP, Etiology Uncertain however Possible Narcotic Benzodiazipine Related?    ??? Adjustment disorder with mixed anxiety and depressed mood    ??? Aneurysm of internal carotid artery    ??? Arthritis of knee    ??? Asthma    ??? Autoimmune disease (HCC)    ??? CAD (coronary artery disease)    ??? Cervicalgia    ??? Chest pain 03/17/2014    Dobutamine Stress Echo: NORMAL WALL MOTION AND GLOBAL SYSTOLIC FUNCTION AT REST AND AFTER?? STRESS/EXERCISE WITH APPROPRIATE AUGMENTATION OF FUNCTION IN ALL SEGMENTS. NO EVIDENCE OF INDUCIBLE ISCHEMIA AT ADEQUATE HEART RATE WITH DOBUTAMINE STRESS.??            ??? Chronic low back pain 10/22/2014    [facility]: Moderate facet arthropathy at L4-L5 and L5-S1. Mild/moderate foraminal stenosis at these levels. Disc bulge with annular tear at L4-L5.    ??? Chronic pain syndrome    ??? Coccydynia    ??? Diabetes mellitus     NIDDM   ??? DVT (deep venous thrombosis) (HCC) 05/19/2007    [facility] CTA: 1. Small nonocclusive pulmonary embolus in a subsegmental branch to the left lower lobe. Larger filling defect  in a segmental branch to the right lower lobe, not as low in attenuation as is normally seen with a pulmonary embolus, however is seen in multiple planes and remains concerning for an additional nonocclusive pulmonary embolus.   ??? Fibromyalgia    ??? Gastric ulcer    ??? Gastritis 01/03/2015    [facility] Admission, EGD + H. Pylori/Gastritis   ??? Generalized osteoarthritis of multiple sites    ??? GI bleed 01/01/2015    [facility] Admit 5/2-01/07/2015   ??? H. pylori infection 01/03/2015    [facility] Admission, EGD + H. Pylori/Gastritis   ??? Hemiparesis, right (HCC)    ??? Hyperlipidemia    ??? Hypertension    ??? Irregular sleep-wake rhythm    ??? Knee joint pain    ??? Lumbar spondylosis     Orthopedic Spine Surgeon: Dr. Ree Kida L. Henrene Hawking    ??? Muscle spasm    ??? Normocytic anemia     Chronic Iron Deficiency Anemia   ??? Osteoporosis    ??? Pancreatitis    ??? Peripheral neuropathy    ??? Pulmonary embolism (HCC)      Multiple   ??? Seizures (HCC)    ??? Sensory ataxia    ??? SLE (systemic lupus erythematosus) (HCC)    ??? Stroke [facility])    ??? Subclinical hypothyroidism    ??? Vitamin D deficiency      Past Surgical History:   Procedure Laterality Date   ??? CARDIAC SURG PROCEDURE UNLIST      Cardiac Cath PCI w/ Stents   ??? HX CHOLECYSTECTOMY     ??? HX GI      Partial Gastrectomy    ??? HX GI  01/03/2015    [facility] EGD by Dr. Smitty Cords D. Waldholtz: S/P B-2 Gastric Resection, Gastritis in small remnant, Bx of Jejunum r/o Celiac Disease and of Stomach. + Gastritis + H. Pylori   ??? HX HYSTERECTOMY         Social History     Social History     Social History   ??? Marital status: DIVORCED     Spouse name: N/A   ??? Number of children: N/A   ??? Years of education: N/A     Occupational History   ??? Not on file.     Social History Main Topics   ??? Smoking status: Former Smoker   ??? Smokeless tobacco: Never Used   ??? Alcohol use No   ??? Drug use: No   ??? Sexual activity: Not Currently     Partners: Male     Other Topics Concern   ??? Not on file     Social History Narrative       Family History     Family History   Problem Relation Age of Onset   ??? Diabetes Maternal Grandmother        Current Medications     Prior to Admission Medications   Prescriptions Last Dose Informant Patient Reported? Taking?   BUSPIRONE HCL (BUSPAR PO)   Yes No   Sig: Take  by mouth two (2) times a day.   Cholecalciferol, Vitamin D3, (VITAMIN D) 1,000 unit Cap   Yes No   Sig: Take 50,000 Units by mouth every seven (7) days.   amitriptyline (ELAVIL) 50 mg tablet   Yes No   Sig: Take 50 mg by mouth nightly.   apixaban (ELIQUIS) 5 mg tablet   No No   Sig: Take 1 Tab by mouth every twelve (12) hours.  ascorbic acid, vitamin C, (VITAMIN C) 250 mg tablet   No No   Sig: Take 1 Tab by mouth two (2) times a day. Take with iron pill   lamoTRIgine (LAMICTAL) 100 mg tablet   No No   Sig: Take 1 Tab by mouth two (2) times a day.   ondansetron hcl (ZOFRAN, AS HYDROCHLORIDE,) 4 mg tablet   No No    Sig: Take 1 Tab by mouth every eight (8) hours as needed for Nausea.   oxyCODONE IR (OXY-IR) 15 mg immediate release tablet   No No   Sig: Take 1 Tab by mouth four (4) times daily. Max Daily Amount: 60 mg.   pantoprazole (PROTONIX) 40 mg tablet   No No   Sig: Take 1 Tab by mouth two (2) times a day.   predniSONE (DELTASONE) 20 mg tablet   No No   Sig: Tale 3 tabs daily for 3 days, then 2 tabs daily for 3 days, then one tab daily for three days by mouth   simvastatin (ZOCOR) 80 mg tablet   Yes No   Sig: Take 80 mg by mouth nightly.   sucralfate (CARAFATE) 100 mg/mL suspension   Yes No   Sig: Take 1 tsp by mouth two (2) times a day.      Facility-Administered Medications: None       Allergies     Allergies   Allergen Reactions   ??? Tape [Adhesive] Itching     Paper tape      ??? Aspirin Not Reported This Time     Because of hx ulcers per patient   ??? Opana [Oxymorphone] Rash and Itching   ??? Tylenol [Acetaminophen] Itching       Physical Exam      ED Triage Vitals   ED Encounter Vitals Group      BP --       Pulse --       Resp --       Temp --       Temp src --       SpO2 --       Weight --       Height --        Constitutional: Patient appears tearful, well developed and well nourished. Appearance and behavior are age and situation appropriate.   HEENT: Conjunctiva clear. EOMs intact.  PERRL. Mucous membranes moist, non-erythematous.   Neck: supple, non tender, symmetrical, no masses. No midline cervical tenderness.  C-collar in place that was removed during exam.  Respiratory: lungs clear to auscultation, nonlabored respirations. No tachypnea or accessory muscle use.  Cardiovascular: heart regular rate and rhythm without murmur rubs or gallops. No peripheral edema. Distal pulses intact +2 bilaterally.   Gastrointestinal:  Abdomen soft, bowel sounds present x4, nontender without complaint of pain to palpation  Musculoskeletal:  Tenderness to palpation over the left posterior and  anterior ribs, right hip, bilateral low back. No joint erythema or edema. Nail beds pink with prompt capillary refill.  Distal pulses are intact in lower extremities +2.  Able to flex and extend at the knees.  Patient feels unable to perform straight leg raise on the right leg due to pain in her low back.  Integumentary: warm and dry without rashes or lesions  Neurologic: alert and oriented. Sensation intact bilaterally. No facial asymmetry.   Psyc: Tearful    Impression and Management Plan   Patient presenting for evaluation of syncopal episode with fall with  pain to ribs, hip, back.  We'll obtain CBC, BMP, urinalysis, urine drug screen, point of care glucose, x-rays of all of the aforementioned areas of musculoskeletal pain.  We'll also obtain CT of the head and neck as patient did have syncopal episode and is on Eliquis.  We'll medicate supportively, she takes Roxicodone 15 mg IR at home.    Diagnostic Studies   Lab:   Recent Results (from the past 12 hour(s))   CBC WITH AUTOMATED DIFF    Collection Time: 02/18/17  9:20 PM   Result Value Ref Range    WBC 5.6 4.0 - 11.0 1000/mm3    RBC 3.59 (L) 3.60 - 5.20 M/uL    HGB 8.0 (L) 13.0 - 17.2 gm/dl    HCT 16.1 (L) 09.6 - 50.0 %    MCV 73.8 (L) 80.0 - 98.0 fL    MCH 22.3 (L) 25.4 - 34.6 pg    MCHC 30.2 30.0 - 36.0 gm/dl    PLATELET 045 409 - 811 1000/mm3    MPV 9.4 6.0 - 10.0 fL    RDW-SD 53.3 (H) 36.4 - 46.3      NRBC 0 0 - 0      IMMATURE GRANULOCYTES 0.4 0.0 - 3.0 %    NEUTROPHILS 62.5 34 - 64 %    LYMPHOCYTES 27.2 (L) 28 - 48 %    MONOCYTES 5.8 1 - 13 %    EOSINOPHILS 3.2 0 - 5 %    BASOPHILS 0.9 0 - 3 %   METABOLIC PANEL, BASIC    Collection Time: 02/18/17  9:20 PM   Result Value Ref Range    Sodium 147 (H) 136 - 145 mEq/L    Potassium 3.1 (L) 3.5 - 5.1 mEq/L    Chloride 114 (H) 98 - 107 mEq/L    CO2 25 21 - 32 mEq/L    Glucose 78 74 - 106 mg/dl    BUN 10 7 - 25 mg/dl    Creatinine 0.6 0.6 - 1.3 mg/dl    GFR est AA >91.4      GFR est non-AA >60       Calcium 8.3 (L) 8.5 - 10.1 mg/dl    Anion gap 8 5 - 15 mmol/L   POC TROPONIN-I    Collection Time: 02/18/17  9:31 PM   Result Value Ref Range    Troponin-I 0.01 0.00 - 0.07 ng/ml   GLUCOSE, POC    Collection Time: 02/18/17  9:32 PM   Result Value Ref Range    Glucose (POC) 86 65 - 105 mg/dL   POC URINE MACROSCOPIC    Collection Time: 02/18/17 11:22 PM   Result Value Ref Range    Glucose Negative NEGATIVE,Negative mg/dl    Bilirubin Negative NEGATIVE,Negative      Ketone Negative NEGATIVE,Negative mg/dl    Specific gravity <=7.829 1.005 - 1.030      Blood Negative NEGATIVE,Negative      pH (UA) 5.5 5 - 9      Protein Negative NEGATIVE,Negative mg/dl    Urobilinogen 0.2 0.0 - 1.0 EU/dl    Nitrites Negative NEGATIVE,Negative      Leukocyte Esterase Trace (A) NEGATIVE,Negative      Color Yellow      Appearance Clear     POC URINE MICROSCOPIC    Collection Time: 02/18/17 11:22 PM   Result Value Ref Range    Epithelial cells, squamous 5-9 /LPF    WBC 1-4 /HPF    Bacteria OCCASIONAL /  HPF     Labs Reviewed   CBC WITH AUTOMATED DIFF - Abnormal; Notable for the following:        Result Value    RBC 3.59 (*)     HGB 8.0 (*)     HCT 26.5 (*)     MCV 73.8 (*)     MCH 22.3 (*)     RDW-SD 53.3 (*)     LYMPHOCYTES 27.2 (*)     All other components within normal limits   METABOLIC PANEL, BASIC - Abnormal; Notable for the following:     Sodium 147 (*)     Potassium 3.1 (*)     Chloride 114 (*)     Calcium 8.3 (*)     All other components within normal limits   POC URINE MACROSCOPIC - Abnormal; Notable for the following:     Leukocyte Esterase Trace (*)     All other components within normal limits   DRUG SCREEN, URINE   GLUCOSE, POC   POC TROPONIN-I   POC URINE MICROSCOPIC   GLUCOSE, POC       Imaging:    Xr Hip Rt W Or Wo Pelv 2-3 Vws    Result Date: 02/18/2017  Indication: Larey Seat. Right hip pain.     IMPRESSION: 2 views of the right hip and bony pelvis reveal no fracture or  dislocation. Mild degenerative change in the lower lumbar spine.     Ct Head Wo Cont    Result Date: 02/18/2017  History: fall on [place]         Impression: Normal for age noncontrast brain CT. Comment: CT images of the head were obtained without intravenous contrast. This was compared with August 08, 2016 and July 17, 2015. No interval change. The brain parenchyma, ventricles and sulci are within the limits of normal for age. No infarcts, hemorrhages, mass effect or extracerebral collections are seen.     Ct Spine Cerv Wo Cont    Result Date: 02/18/2017  Indication: Larey Seat. On blood thinners.     IMPRESSION: Degenerative change. No fracture. Comment: CT images of the cervical spine were obtained without intravenous contrast. This was compared with April 06, 2015 and November 26, 2014. No fracture or dislocation. There is chronic discogenic disease at C3-7 with disc space narrowing sclerosis and small osteophyte formation. Osteophytes produce mild to moderate neural foramen encroachment on the right at C3-C6. No spinal stenosis. There is first degree anterolisthesis of C4-5 on C6. Vertebral body stature and alignment is otherwise maintained throughout.     Ct Abd Pelv W Cont    Result Date: 02/18/2017  Indication: Fall. Left upper quadrant pain. Blood thinners.     IMPRESSION: Chronic biliary dilatation. Gastric bypass surgery and cholecystectomy. Distended bladder. Constipation. Hysterectomy. Comment: CT images of the abdomen and pelvis were obtained following administration of intravenous contrast. This was compared with August 22, 2016 and December 26, 2015. There is subsegmental atelectasis at the lung bases. Small hiatal hernia. Gastric bypass surgery and cholecystectomy have been performed. There is chronic dilatation of the intra and extrahepatic biliary tree, unchanged. The spleen pancreas and adrenal glands are unremarkable. Prominent extrarenal pelvises are noted bilaterally. The  bladder is distended compatible with bladder outlet obstruction. Otherwise the kidneys are unremarkable. The uterus is been surgically removed. Stool load is indicative of constipation. No bowel obstruction, free intraperitoneal air, free fluid or appendicitis.     Xr Ribs Lt W Pa Cxr Min 3 V  Result Date: 02/18/2017  Indication: Larey Seat. Rib pain.     IMPRESSION: 4 views of the left ribs reveal nondisplaced fracture of the lateral aspect of left ninth rib. No hemothorax or pneumothorax. The left lung is clear and the heart is of normal size. Gastric bypass surgery and cholecystectomy have been performed. This was compared with April 06, 2015.       EKG:     ED [name]/ Medical Decision Making   Patient remained stable in the emergency department.  Chest x-ray showed nondisplaced fracture of the left ninth rib, no hemo-or pneumothorax.  CT of the abdomen was obtained that she began to have abdominal pain once returning from her chest x-ray concern was for splenic rupture which CT was negative for.  It did show that she has constipation without bowel obstruction.  CT of her head and neck were obtained as she was unsure if she hit her head and she is on Eliquis.  These were negative for mass, hemorrhage, fracture.  Right hip x-ray was negative for acute fracture.  Patient had negative troponin, normal sinus rhythm on her EKG.  Lab work showed a mild anemia with a hemoglobin of 8.0, Hemoccult negative.  Patient did have a hypokalemia of 3.1 which was replaced with oral potassium.  As she did state that she has had multiple syncopal episodes in the past week feel she will require inpatient admission for further monitoring.  Plan was discussed with patient who is happy and amenable to stay in the hospital.  Her case was discussed with Dr. Ignacia Palma which was accepted her for admission.  Medications   potassium bicarbonate (KLYTE) tablet 25 mEq (not administered)    sodium chloride (NS) flush 5-10 mL (10 mL IntraVENous Given 02/18/17 2141)   sodium chloride 0.9 % bolus infusion 1,000 mL (1,000 mL IntraVENous New Bag 02/18/17 2141)   oxyCODONE IR (ROXICODONE) tablet 5 mg (5 mg Oral Given 02/18/17 1945)   iopamidol (ISOVUE-370) 76 % injection 80 mL (80 mL IntraVENous Given 02/18/17 2219)         Final Diagnosis       ICD-10-CM ICD-9-CM   1. Syncope and collapse R55 780.2   2. Hypokalemia E87.6 276.8   3. Closed fracture of one rib of left side, initial encounter S22.32XA 807.01         Disposition   Admission   Current Discharge Medication List        The patient was personally evaluated by myself and EMILY A HAHN, MD who agrees with the above assessment and plan.    Vonna Kotyk, NP-C  February 19, 2017    My signature above authenticates this document and my orders, the final ??  diagnosis (es), discharge prescription (s), and instructions in the Epic ??  record.  If you have any questions please contact [phone].  ??  Nursing notes have been reviewed by the physician/ advanced practice ??  Clinician.    Dragon medical dictation software was used for portions of this report. Unintended voice recognition errors may occur.

## 2017-02-18 NOTE — H&P (Signed)
Medicine History and Physical    Patient: [name] [name] Age: 66 y.o. 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   PT evaluation  Diet Cardiac   DVT PPX SCD, resume Eliquis once bleeding ruled out.   CODE STATUS FULL CODE                Chief Complaint:  Chief Complaint   Patient presents with   ??? Fall   ??? Back Pain   ??? Flank Pain     left         HPI:   [name] [name] is a 66 y.o. year old female who presents after syncopal episode, has had six syncopal episodes over the past one week.Pt fell to floor, had pain on left side of hip, Xray of right hip was -ve for fx, CXR shows rib fracture on left side. Denies CT abdomen and pelvis with contrast: Chronic biliary dilatation. Gastric bypass surgery and cholecystectomy. Distended bladder. Constipation. Hysterectomy.  CT head shows Normal for age noncontrast brain CT. ECG without acute ischemia or arrhythmia. Noted to have lower H/H without any reported blood loss, FOBT -ve.     Review of Systems - 12 Point ROS -ve except what is noted in the HPI.     Past Medical History:  Past Medical History:   Diagnosis Date   ??? Acute hypercapnic respiratory failure (HCC) 02/05/2013    W/ Encephalopathy requiring BiPAP, Etiology Uncertain however Possible  Narcotic Benzodiazipine Related?    ??? Adjustment disorder with mixed anxiety and depressed mood    ??? Aneurysm of internal carotid artery    ??? Arthritis of knee    ??? Asthma    ??? Autoimmune disease (HCC)    ??? CAD (coronary artery disease)    ??? Cervicalgia    ??? Chest pain 03/17/2014    Dobutamine Stress Echo: NORMAL WALL MOTION AND GLOBAL SYSTOLIC FUNCTION AT REST AND AFTER?? STRESS/EXERCISE WITH APPROPRIATE AUGMENTATION OF FUNCTION IN ALL SEGMENTS. NO EVIDENCE OF INDUCIBLE ISCHEMIA AT ADEQUATE HEART RATE WITH DOBUTAMINE STRESS.??            ??? Chronic low back pain 10/22/2014    [facility] Lumbar w/o contrast: Moderate facet arthropathy at L4-L5 and L5-S1. Mild/moderate foraminal stenosis at these levels. Disc bulge with annular tear at L4-L5.    ??? Chronic pain syndrome    ??? Coccydynia    ??? Diabetes mellitus     NIDDM   ??? DVT (deep venous thrombosis) (HCC) 05/19/2007    [facility] CTA: 1. Small nonocclusive pulmonary embolus in a subsegmental branch to the left lower lobe. Larger filling  defect in a segmental branch to the right lower lobe, not as low in attenuation as is normally seen with a pulmonary embolus, however is seen in multiple planes and remains concerning for an additional nonocclusive pulmonary embolus.   ??? Fibromyalgia    ??? Gastric ulcer    ??? Gastritis 01/03/2015    [facility] Admission, EGD + H. Pylori/Gastritis   ??? Generalized osteoarthritis of multiple sites    ??? GI bleed 01/01/2015    [facility] Admit 5/2-01/07/2015   ??? H. pylori infection 01/03/2015    [facility] Admission, EGD + H. Pylori/Gastritis   ??? Hemiparesis, right (HCC)    ??? Hyperlipidemia    ??? Hypertension    ??? Irregular sleep-wake rhythm    ??? Knee joint pain    ??? Lumbar spondylosis     Orthopedic Spine Surgeon: Dr. Ree Kida L. Henrene Hawking    ??? Muscle spasm    ??? Normocytic anemia     Chronic Iron Deficiency Anemia   ??? Osteoporosis    ??? Pancreatitis    ??? Peripheral neuropathy    ??? Pulmonary embolism (HCC)     Multiple   ??? Seizures (HCC)    ??? Sensory ataxia     ??? SLE (systemic lupus erythematosus) (HCC)    ??? Stroke [facility])    ??? Subclinical hypothyroidism    ??? Vitamin D deficiency        Past Surgical History:  Past Surgical History:   Procedure Laterality Date   ??? CARDIAC SURG PROCEDURE UNLIST      Cardiac Cath PCI w/ Stents   ??? HX CHOLECYSTECTOMY     ??? HX GI      Partial Gastrectomy    ??? HX GI  01/03/2015    [facility] EGD by Dr. Smitty Cords D. Waldholtz: S/P B-2 Gastric Resection, Gastritis in small remnant, Bx of Jejunum r/o Celiac Disease and of Stomach. + Gastritis + H. Pylori   ??? HX HYSTERECTOMY         Family History:  Family History   Problem Relation Age of Onset   ??? Diabetes Maternal Grandmother        Social History:  Social History     Social History   ??? Marital status: DIVORCED     Spouse name: N/A   ??? Number of children: N/A   ??? Years of education: N/A     Social History Main Topics   ??? Smoking status: Former Smoker   ??? Smokeless tobacco: Never Used   ??? Alcohol use No   ??? Drug use: No   ??? Sexual activity: Not Currently     Partners: Male     Other Topics Concern   ??? None     Social History Narrative       Home Medications:  Prior to Admission medications    Medication Sig Start Date End Date Taking? Authorizing Provider   apixaban (ELIQUIS) 5 mg tablet Take 1 Tab by mouth every twelve (12) hours. 08/22/16  Yes Khalid Mehmood, MD   ondansetron hcl (ZOFRAN, AS HYDROCHLORIDE,) 4 mg tablet Take 1 Tab by mouth every eight (8) hours as needed for Nausea. 08/22/16  Yes Khalid Mehmood, MD   amitriptyline (ELAVIL) 50 mg tablet Take 50 mg by mouth nightly.   Yes Historical Provider   sucralfate (CARAFATE) 100 mg/mL suspension Take 1 tsp by mouth two (2) times a day.   Yes Historical Provider   predniSONE (DELTASONE) 20 mg tablet Tale 3 tabs daily for 3 days,  then 2 tabs daily for 3 days, then one tab daily for three days by mouth 07/14/16  Yes Johny Drilling, MD   pantoprazole (PROTONIX) 40 mg tablet Take 1 Tab by mouth two (2) times a day. 12/29/15  Yes Hazel Sams, MD    ascorbic acid, vitamin C, (VITAMIN C) 250 mg tablet Take 1 Tab by mouth two (2) times a day. Take with iron pill 12/29/15  Yes Hazel Sams, MD   oxyCODONE IR (OXY-IR) 15 mg immediate release tablet Take 1 Tab by mouth four (4) times daily. Max Daily Amount: 60 mg. 07/20/15  Yes Faythe Ghee, DO   simvastatin (ZOCOR) 80 mg tablet Take 80 mg by mouth nightly.   Yes Phys Other, MD   lamoTRIgine (LAMICTAL) 100 mg tablet Take 1 Tab by mouth two (2) times a day. 01/07/15  Yes Theodis Aguas, MD   Cholecalciferol, Vitamin D3, (VITAMIN D) 1,000 unit Cap Take 50,000 Units by mouth every seven (7) days.   Yes Historical Provider       Allergies:  Allergies   Allergen Reactions   ??? Tape [Adhesive] Itching     Paper tape      ??? Aspirin Not Reported This Time     Because of hx ulcers per patient   ??? Opana [Oxymorphone] Rash and Itching   ??? Tylenol [Acetaminophen] Itching         Physical Exam:     Visit Vitals   ??? BP 132/82   ??? Pulse 83   ??? Temp 98.2 ??F (36.8 ??C)   ??? Resp 18   ??? Ht 5\' 9"  (1.753 m)   ??? Wt 63.5 kg (140 lb)   ??? SpO2 99%   ??? BMI 20.67 kg/m2       Physical Exam:  General appearance: alert, cooperative, no distress, appears stated age  Head: Normocephalic, without obvious abnormality, atraumatic  Neck: supple, trachea midline  Lungs: clear to auscultation bilaterally  Heart: regular rate and rhythm, S1, S2 normal, no murmur, click, rub or gallop  Abdomen: soft, non-tender. Bowel sounds normal. No masses,  no organomegaly  Extremities: extremities normal, atraumatic, no cyanosis or edema  Skin: dry skin, dry MM  Neurologic: Grossly normal    Intake and Output:  Current Shift:     Last three shifts:       Lab/Data Reviewed:  Lab:   Recent Results (from the past 12 hour(s))   CBC WITH AUTOMATED DIFF    Collection Time: 02/18/17  9:20 PM   Result Value Ref Range    WBC 5.6 4.0 - 11.0 1000/mm3    RBC 3.59 (L) 3.60 - 5.20 M/uL    HGB 8.0 (L) 13.0 - 17.2 gm/dl    HCT 82.9 (L) 56.2 - 50.0 %    MCV 73.8 (L) 80.0 - 98.0 fL     MCH 22.3 (L) 25.4 - 34.6 pg    MCHC 30.2 30.0 - 36.0 gm/dl    PLATELET 130 865 - 784 1000/mm3    MPV 9.4 6.0 - 10.0 fL    RDW-SD 53.3 (H) 36.4 - 46.3      NRBC 0 0 - 0      IMMATURE GRANULOCYTES 0.4 0.0 - 3.0 %    NEUTROPHILS 62.5 34 - 64 %    LYMPHOCYTES 27.2 (L) 28 - 48 %    MONOCYTES 5.8 1 - 13 %    EOSINOPHILS 3.2 0 - 5 %    BASOPHILS 0.9 0 -  3 %   METABOLIC PANEL, BASIC    Collection Time: 02/18/17  9:20 PM   Result Value Ref Range    Sodium 147 (H) 136 - 145 mEq/L    Potassium 3.1 (L) 3.5 - 5.1 mEq/L    Chloride 114 (H) 98 - 107 mEq/L    CO2 25 21 - 32 mEq/L    Glucose 78 74 - 106 mg/dl    BUN 10 7 - 25 mg/dl    Creatinine 0.6 0.6 - 1.3 mg/dl    GFR est AA >30.8      GFR est non-AA >60      Calcium 8.3 (L) 8.5 - 10.1 mg/dl    Anion gap 8 5 - 15 mmol/L   POC TROPONIN-I    Collection Time: 02/18/17  9:31 PM   Result Value Ref Range    Troponin-I 0.01 0.00 - 0.07 ng/ml   GLUCOSE, POC    Collection Time: 02/18/17  9:32 PM   Result Value Ref Range    Glucose (POC) 86 65 - 105 mg/dL       Imaging:    Xr Hip Rt W Or Wo Pelv 2-3 Vws    Result Date: 02/18/2017  Indication: Larey Seat. Right hip pain.     IMPRESSION: 2 views of the right hip and bony pelvis reveal no fracture or dislocation. Mild degenerative change in the lower lumbar spine.     Ct Head Wo Cont    Result Date: 02/18/2017  History: fall on [place]         Impression: Normal for age noncontrast brain CT. Comment: CT images of the head were obtained without intravenous contrast. This was compared with August 08, 2016 and July 17, 2015. No interval change. The brain parenchyma, ventricles and sulci are within the limits of normal for age. No infarcts, hemorrhages, mass effect or extracerebral collections are seen.     Ct Spine Cerv Wo Cont    Result Date: 02/18/2017  Indication: Larey Seat. On blood thinners.     IMPRESSION: Degenerative change. No fracture. Comment: CT images of the cervical spine were obtained without intravenous contrast. This was  compared with April 06, 2015 and November 26, 2014. No fracture or dislocation. There is chronic discogenic disease at C3-7 with disc space narrowing sclerosis and small osteophyte formation. Osteophytes produce mild to moderate neural foramen encroachment on the right at C3-C6. No spinal stenosis. There is first degree anterolisthesis of C4-5 on C6. Vertebral body stature and alignment is otherwise maintained throughout.     Ct Abd Pelv W Cont    Result Date: 02/18/2017  Indication: Fall. Left upper quadrant pain. Blood thinners.     IMPRESSION: Chronic biliary dilatation. Gastric bypass surgery and cholecystectomy. Distended bladder. Constipation. Hysterectomy. Comment: CT images of the abdomen and pelvis were obtained following administration of intravenous contrast. This was compared with August 22, 2016 and December 26, 2015. There is subsegmental atelectasis at the lung bases. Small hiatal hernia. Gastric bypass surgery and cholecystectomy have been performed. There is chronic dilatation of the intra and extrahepatic biliary tree, unchanged. The spleen pancreas and adrenal glands are unremarkable. Prominent extrarenal pelvises are noted bilaterally. The bladder is distended compatible with bladder outlet obstruction. Otherwise the kidneys are unremarkable. The uterus is been surgically removed. Stool load is indicative of constipation. No bowel obstruction, free intraperitoneal air, free fluid or appendicitis.     Xr Ribs Adalberto Ill Pa Cxr Min 3 V    Result Date: 02/18/2017  Indication: Larey Seat. Rib pain.     IMPRESSION: 4 views of the left ribs reveal nondisplaced fracture of the lateral aspect of left ninth rib. No hemothorax or pneumothorax. The left lung is clear and the heart is of normal size. Gastric bypass surgery and cholecystectomy have been performed. This was compared with April 06, 2015.         EKG: tracing reviewed  Independently NSR 92 bpm, no STE, no TWI, qtc 442 msec      Irving Burton, MD   February 18, 2017

## 2017-02-19 ENCOUNTER — Inpatient Hospital Stay: Admit: 2017-02-19 | Payer: PRIVATE HEALTH INSURANCE | Primary: Internal Medicine

## 2017-02-19 ENCOUNTER — Observation Stay

## 2017-02-19 LAB — CBC WITH AUTOMATED DIFF
BASOPHILS: 0.9 % (ref 0–3)
EOSINOPHILS: 3.2 % (ref 0–5)
HCT: 26.5 % — ABNORMAL LOW (ref 37.0–50.0)
HGB: 8 gm/dl — ABNORMAL LOW (ref 13.0–17.2)
IMMATURE GRANULOCYTES: 0.4 % (ref 0.0–3.0)
LYMPHOCYTES: 27.2 % — ABNORMAL LOW (ref 28–48)
MCH: 22.3 pg — ABNORMAL LOW (ref 25.4–34.6)
MCHC: 30.2 gm/dl (ref 30.0–36.0)
MCV: 73.8 fL — ABNORMAL LOW (ref 80.0–98.0)
MONOCYTES: 5.8 % (ref 1–13)
MPV: 9.4 fL (ref 6.0–10.0)
NEUTROPHILS: 62.5 % (ref 34–64)
NRBC: 0 (ref 0–0)
PLATELET: 336 10*3/uL (ref 140–450)
RBC: 3.59 M/uL — ABNORMAL LOW (ref 3.60–5.20)
RDW-SD: 53.3 — ABNORMAL HIGH (ref 36.4–46.3)
WBC: 5.6 10*3/uL (ref 4.0–11.0)

## 2017-02-19 LAB — POC URINE MACROSCOPIC
Bilirubin: NEGATIVE
Blood: NEGATIVE
Glucose: NEGATIVE mg/dl
Ketone: NEGATIVE mg/dl
Nitrites: NEGATIVE
Protein: NEGATIVE mg/dl
Specific gravity: <=1.005 (ref 1.005–1.030)
Urobilinogen: 0.2 EU/dl (ref 0.0–1.0)
pH (UA): 5.5 (ref 5–9)

## 2017-02-19 LAB — HEPATIC FUNCTION PANEL
ALT (SGPT): 12 U/L (ref 12–78)
AST (SGOT): 18 U/L (ref 15–37)
Albumin: 3.2 gm/dl — ABNORMAL LOW (ref 3.4–5.0)
Alk. phosphatase: 72 U/L (ref 45–117)
Bilirubin, direct: <0.1 mg/dl (ref 0.0–0.2)
Bilirubin, total: 0.2 mg/dl (ref 0.2–1.0)
Protein, total: 6.5 gm/dl (ref 6.4–8.2)

## 2017-02-19 LAB — GLUCOSE, POC
Glucose (POC): 86 mg/dL (ref 65–105)
Glucose (POC): 84 mg/dL (ref 65–105)
Glucose (POC): 106 mg/dL — ABNORMAL HIGH (ref 65–105)
Glucose (POC): 115 mg/dL — ABNORMAL HIGH (ref 65–105)
Glucose (POC): 85 mg/dL (ref 65–105)

## 2017-02-19 LAB — EKG, 12 LEAD, INITIAL
Atrial Rate: 92 {beats}/min
Calculated P Axis: 69 degrees
Calculated R Axis: 42 degrees
Calculated T Axis: 44 degrees
Diagnosis: NORMAL
P-R Interval: 154 ms
Q-T Interval: 358 ms
QRS Duration: 90 ms
QTC Calculation (Bezet): 442 ms
Ventricular Rate: 92 {beats}/min

## 2017-02-19 LAB — METABOLIC PANEL, BASIC
Anion gap: 8 mmol/L (ref 5–15)
BUN: 10 mg/dl (ref 7–25)
CO2: 25 mEq/L (ref 21–32)
Calcium: 8.3 mg/dl — ABNORMAL LOW (ref 8.5–10.1)
Chloride: 114 mEq/L — ABNORMAL HIGH (ref 98–107)
Creatinine: 0.6 mg/dl (ref 0.6–1.3)
GFR est AA: >60
GFR est non-AA: >60
Glucose: 78 mg/dl (ref 74–106)
Potassium: 3.1 mEq/L — ABNORMAL LOW (ref 3.5–5.1)
Sodium: 147 mEq/L — ABNORMAL HIGH (ref 136–145)

## 2017-02-19 LAB — POC URINE MICROSCOPIC

## 2017-02-19 LAB — POTASSIUM
Potassium: 3.8 mEq/L (ref 3.5–5.1)
Potassium: 3.6 mEq/L (ref 3.5–5.1)

## 2017-02-19 LAB — HGB & HCT
HCT: 25.1 % — ABNORMAL LOW (ref 37.0–50.0)
HCT: 26.9 % — ABNORMAL LOW (ref 37.0–50.0)
HGB: 7.6 gm/dl — ABNORMAL LOW (ref 13.0–17.2)
HGB: 8.1 gm/dl — ABNORMAL LOW (ref 13.0–17.2)

## 2017-02-19 LAB — FOLATE: Folate: 26.2 ng/ml — ABNORMAL HIGH (ref 3.1–17.5)

## 2017-02-19 LAB — PREALBUMIN: Prealbumin: 0.246 gm/L (ref 0.200–0.400)

## 2017-02-19 LAB — TROPONIN I
Troponin-I: <0.015 ng/ml (ref 0.000–0.045)
Troponin-I: <0.015 ng/ml (ref 0.000–0.045)
Troponin-I: <0.015 ng/ml (ref 0.000–0.045)

## 2017-02-19 LAB — CORTISOL: Cortisol, random: 19.07 ug/dL

## 2017-02-19 LAB — POC TROPONIN: Troponin-I: 0.01 ng/ml (ref 0.00–0.07)

## 2017-02-19 LAB — T4, FREE: Free T4: 0.78 ng/dl (ref 0.76–1.46)

## 2017-02-19 LAB — VITAMIN B12: Vitamin B12: 250 pg/ml (ref 193–986)

## 2017-02-19 LAB — TSH 3RD GENERATION: TSH: 2.98 u[IU]/mL (ref 0.358–3.740)

## 2017-02-19 LAB — D DIMER: D DIMER: 0.64 ug/mL (FEU) — ABNORMAL HIGH (ref 0.01–0.50)

## 2017-02-19 LAB — EKG 12-LEAD
Atrial Rate: 92 {beats}/min
Diagnosis: NORMAL
P Axis: 69 degrees
P-R Interval: 154 ms
Q-T Interval: 358 ms
QRS Duration: 90 ms
QTc Calculation (Bazett): 442 ms
R Axis: 42 degrees
T Axis: 44 degrees
Ventricular Rate: 92 {beats}/min

## 2017-02-19 LAB — D-DIMER, QUANTITATIVE: D-Dimer, Quant: 0.64 ug/mL (FEU) — ABNORMAL HIGH (ref 0.01–0.50)

## 2017-02-19 MED ORDER — SODIUM CHLORIDE 0.9 % IJ SYRG
Freq: Three times a day (TID) | INTRAMUSCULAR | Status: DC
Start: 2017-02-19 — End: 2017-02-20
  Administered 2017-02-19 – 2017-02-20 (×7): via INTRAVENOUS

## 2017-02-19 MED ORDER — PANTOPRAZOLE 40 MG TAB, DELAYED RELEASE
40 mg | Freq: Two times a day (BID) | ORAL | Status: DC
Start: 2017-02-19 — End: 2017-02-20
  Administered 2017-02-19 – 2017-02-20 (×4): via ORAL

## 2017-02-19 MED ORDER — POTASSIUM BICARBONATE-CITRIC ACID 25 MEQ EFFERVESCENT TAB
25 mEq | ORAL | Status: AC
Start: 2017-02-19 — End: 2017-02-19
  Administered 2017-02-19: 04:00:00 via ORAL

## 2017-02-19 MED ORDER — OXYCODONE 5 MG TAB
5 mg | Freq: Four times a day (QID) | ORAL | Status: DC | PRN
Start: 2017-02-19 — End: 2017-02-20
  Administered 2017-02-19 – 2017-02-20 (×4): via ORAL

## 2017-02-19 MED ORDER — SODIUM CHLORIDE 0.9 % IV
INTRAVENOUS | Status: DC
Start: 2017-02-19 — End: 2017-02-19
  Administered 2017-02-19: 11:00:00 via INTRAVENOUS

## 2017-02-19 MED ORDER — SODIUM CHLORIDE 0.9 % IJ SYRG
INTRAMUSCULAR | Status: DC | PRN
Start: 2017-02-19 — End: 2017-02-20

## 2017-02-19 MED ORDER — ONDANSETRON (PF) 4 MG/2 ML INJECTION
4 mg/2 mL | Freq: Four times a day (QID) | INTRAMUSCULAR | Status: DC | PRN
Start: 2017-02-19 — End: 2017-02-20
  Administered 2017-02-19 – 2017-02-20 (×3): via INTRAVENOUS

## 2017-02-19 MED ORDER — LAMOTRIGINE 100 MG TAB
100 mg | Freq: Two times a day (BID) | ORAL | Status: DC
Start: 2017-02-19 — End: 2017-02-20
  Administered 2017-02-19 – 2017-02-20 (×4): via ORAL

## 2017-02-19 MED ORDER — NALOXONE 0.4 MG/ML INJECTION
0.4 mg/mL | INTRAMUSCULAR | Status: DC | PRN
Start: 2017-02-19 — End: 2017-02-20

## 2017-02-19 MED ORDER — SIMVASTATIN 40 MG TAB
40 mg | Freq: Every evening | ORAL | Status: DC
Start: 2017-02-19 — End: 2017-02-20
  Administered 2017-02-19 – 2017-02-20 (×2): via ORAL

## 2017-02-19 MED ORDER — IOPAMIDOL 76 % IV SOLN
370 mg iodine /mL (76 %) | Freq: Once | INTRAVENOUS | Status: AC
Start: 2017-02-19 — End: 2017-02-18
  Administered 2017-02-19: 02:00:00 via INTRAVENOUS

## 2017-02-19 MED ORDER — NS WITH POTASSIUM CHLORIDE 40 MEQ/L IV
40 mEq/L | INTRAVENOUS | Status: DC
Start: 2017-02-19 — End: 2017-02-20
  Administered 2017-02-19 – 2017-02-20 (×2): via INTRAVENOUS

## 2017-02-19 MED ORDER — SUCRALFATE 100 MG/ML ORAL SUSP
100 mg/mL | Freq: Two times a day (BID) | ORAL | Status: DC
Start: 2017-02-19 — End: 2017-02-20
  Administered 2017-02-19 – 2017-02-20 (×4): via ORAL

## 2017-02-19 MED FILL — SODIUM CHLORIDE 0.9 % IV: INTRAVENOUS | Qty: 1000

## 2017-02-19 MED FILL — BD POSIFLUSH NORMAL SALINE 0.9 % INJECTION SYRINGE: INTRAMUSCULAR | Qty: 10

## 2017-02-19 MED FILL — PANTOPRAZOLE 40 MG TAB, DELAYED RELEASE: 40 mg | ORAL | Qty: 1

## 2017-02-19 MED FILL — NS WITH POTASSIUM CHLORIDE 40 MEQ/L IV: 40 mEq/L | INTRAVENOUS | Qty: 1000

## 2017-02-19 MED FILL — ISOVUE-370  76 % INTRAVENOUS SOLUTION: 370 mg iodine /mL (76 %) | INTRAVENOUS | Qty: 80

## 2017-02-19 MED FILL — OXYCODONE 5 MG TAB: 5 mg | ORAL | Qty: 1

## 2017-02-19 MED FILL — ONDANSETRON (PF) 4 MG/2 ML INJECTION: 4 mg/2 mL | INTRAMUSCULAR | Qty: 2

## 2017-02-19 MED FILL — POTASSIUM BICARBONATE-CITRIC ACID 25 MEQ EFFERVESCENT TAB: 25 mEq | ORAL | Qty: 1

## 2017-02-19 MED FILL — LAMOTRIGINE 100 MG TAB: 100 mg | ORAL | Qty: 1

## 2017-02-19 MED FILL — SUCRALFATE 100 MG/ML ORAL SUSP: 100 mg/mL | ORAL | Qty: 10

## 2017-02-19 MED FILL — SIMVASTATIN 40 MG TAB: 40 mg | ORAL | Qty: 2

## 2017-02-19 NOTE — Other (Addendum)
TRANSFER - IN REPORT:    Verbal report received from Misty StanleyLisa, Charity fundraiserN (name) on Clemencia CourseVeronica E Kozak  being received from ED (unit) for change in patient condition( )      Report consisted of patient???s Situation, Background, Assessment and   Recommendations(SBAR).     Information from the following report(s) SBAR and Cardiac Rhythm SR  was reviewed with the receiving nurse.    Opportunity for questions and clarification was provided.      Assessment completed upon patient???s arrival to unit and care assumed.

## 2017-02-19 NOTE — Progress Notes (Signed)
Medical Progress Note      NAME: [name] [name]   DOB:  12/04/1950  MRM:  [id number]    Date/Time: 02/19/2017  1:08 PM         Subjective:     [name] [name] is a 66 year old woman with past history of gastric bypass surgery and fibromyalgia. She is admitted after a syncopal event where she fall to her left side. She CO left flank pain. She is unsure if she hit her head or not. She denies HA.   She states she is feeling better and feels that she could manage at home.     Past Medical History reviewed and unchanged from Admission History and Physical    Review of Systems   Constitutional: Positive for fatigue.   HENT: Negative.    Eyes: Negative.    Respiratory: Negative.    Cardiovascular: Negative.    Gastrointestinal: Negative.    Endocrine: Negative.    Genitourinary: Negative.    Musculoskeletal: Negative.    Allergic/Immunologic: Negative.    Neurological: Negative.    Hematological: Negative.    Psychiatric/Behavioral: Negative.             Objective:       Vitals:      Last 24hrs VS reviewed since prior progress note. Most recent are:    Visit Vitals   ??? BP 135/69   ??? Pulse 80   ??? Temp 98.1 ??F (36.7 ??C)   ??? Resp 14   ??? Ht 5\' 7"  (1.702 m)   ??? Wt 66.6 kg (146 lb 13.2 oz)   ??? SpO2 96%   ??? BMI 23 kg/m2     SpO2 Readings from Last 6 Encounters:   02/19/17 96%   08/22/16 100%   08/22/16 97%   08/08/16 98%   07/14/16 100%   05/09/16 100%          Intake/Output Summary (Last 24 hours) at 02/19/17 1308  Last data filed at 02/19/17 0949   Gross per 24 hour   Intake              240 ml   Output              650 ml   Net             -410 ml          Exam:      Physical Exam   Constitutional: She is oriented to person, place, and time. She appears cachectic. She is cooperative. She is easily aroused. She has a sickly appearance.   HENT:   Head: Normocephalic.   Eyes: Pupils are equal, round, and reactive to light.   Neck: Normal range of motion.   Cardiovascular: Normal rate and regular rhythm.     Pulmonary/Chest: Effort normal and breath sounds normal.   Abdominal: Soft. Bowel sounds are normal.   Musculoskeletal: Normal range of motion.   Left flank tender along ribs.    Neurological: She is alert, oriented to person, place, and time and easily aroused.   Skin: Skin is warm.       Telemetry reviewed:   normal sinus rhythm    X-Ray:  Chest/rib fractures    Lab Data Reviewed: (see below)      Medications:  Current Facility-Administered Medications   Medication Dose Route Frequency   ??? oxyCODONE IR (ROXICODONE) tablet 5 mg  5 mg Oral Q6H PRN   ??? lamoTRIgine (LaMICtal) tablet 100  mg  100 mg Oral BID   ??? pantoprazole (PROTONIX) tablet 40 mg  40 mg Oral BID   ??? simvastatin (ZOCOR) tablet 80 mg  80 mg Oral QHS   ??? sucralfate (CARAFATE) 100 mg/mL oral suspension 0.5 g  0.5 g Oral BID   ??? sodium chloride (NS) flush 5-10 mL  5-10 mL IntraVENous Q8H   ??? sodium chloride (NS) flush 5-10 mL  5-10 mL IntraVENous PRN   ??? naloxone (NARCAN) injection 0.1 mg  0.1 mg IntraVENous PRN   ??? 0.9% sodium chloride with KCl 40 mEq/L infusion   IntraVENous CONTINUOUS       ______________________________________________________________________      Lab Review:     Recent Labs      02/19/17   0649  02/18/17   2120   WBC   --   5.6   HGB  7.6*  8.0*   HCT  25.1*  26.5*   PLT   --   336     Recent Labs      02/19/17   0902  02/19/17   0649  02/18/17   2120   NA   --    --   147*   K  3.6  3.8  3.1*   CL   --    --   114*   CO2   --    --   25   GLU   --    --   78   BUN   --    --   10   CREA   --    --   0.6   CA   --    --   8.3*   ALB  3.2*   --    --    SGOT  18   --    --    ALT  12   --    --           Assessment:     Active Problems:    Syncope and collapse (02/18/2017)      Hypokalemia (02/18/2017)      Syncope (02/18/2017)      Left rib fracture (02/18/2017)           Plan:     Risk of deterioration: medium             1. Syncope. WU in progress. ECHO and carotid PVL's pending. TFT's and b12  level ordered. Telemetry monitoring ongoing  2. Hypokalemia. Replacement in progress. Will continue to monitor  3. Fibromyalgia and rib pain. PRN pain meds as needed  4. Anemia Mild. Following trend. Eliquis on hold for now until active bleeding excluded. Continue protonix/carafate  5. History of DVT. eliquis on hold for now.   6. Hyperlipidemia. Continue zocor.   7. Full code status  8. Regular diet  9. Likely for DC in AM if syncope WU negative and if Hb remains stable.          Total time spent with patient: 30 Minutes                  Care Plan discussed with: Patient, Care Manager, Nursing Staff and >50% of time spent in counseling and coordination of care    Discussed:  Care Plan and D/C Planning    Prophylaxis:  SCD's and H2B/PPI    Disposition:  Home w/Family           ___________________________________________________  Attending Physician: Precious Haws, MD

## 2017-02-19 NOTE — Progress Notes (Signed)
TRANSFER - IN REPORT:    Verbal report received from Shirl HarrisSheila Foote, rn(name) on Clemencia CourseVeronica E Bordley  being received from [facility](unit) for routine progression of care      Report consisted of patient???s Situation, Background, Assessment and   Recommendations(SBAR).     Information from the following report(s) SBAR was reviewed with the receiving nurse.    Opportunity for questions and clarification was provided.      Assessment completed upon patient???s arrival to unit and care assumed.

## 2017-02-19 NOTE — Progress Notes (Signed)
Patient admitted on 02/18/2017 from Home with   Chief Complaint   Patient presents with   ??? Fall   ??? Back Pain   ??? Flank Pain     left        The patient has been admitted to the hospital 1 times in the past 12 months.    Tentative dc plan:    Home with Home Health    Anticipated Discharge Date:   02/20/17    PCP: Lynnae Sandhoff, MD     Specialists:     N/A    Face sheet information, address, contact info and insurance verified by patient and daughters:  [name] [name] and [name] [name]    Pharmacy:     [facility] [address])  Any issues getting medications - no    DME at home - [facility]     O2 at home  - no    Home Environment:    Lives at [employer].  [place] [address]    [phone].     Prior to admission open services:     Yes    Home Health Agency-     [facility]    Extended Emergency Contact Information  Primary Emergency Contact: [name] [name]  Address: [address]           [place], [place] [address] [place]  Home Phone: [phone]  Relation: Daughter      Transportation:     Family will transport home    Therapy Recommendations:  OT :y/n     n  PT :y/n     y  SLP :y/n    n    Case Management Assessment    ABUSE/NEGLECT SCREENING   Physical Abuse/Neglect: Denies   Sexual Abuse: Denies   Sexual Abuse: Denies   Other Abuse/Issues: Denies          PRIMARY DECISION MAKER   Primary Decision Maker Name: [name] [name]           Primary Decision Maker Relationship to Patient: Adult child                   CARE MANAGEMENT INTERVENTIONS   Readmission Interview Completed: Not Applicable   PCP Verified by CM: Yes (Dr. Liz Beach)   Last Visit to PCP:  (June 2018)       Mode of Transport at Discharge: Other (see comment) (Family)                       Discharge Durable Medical Equipment: Yes Dan Humphreys)   Physical Therapy Consult: Yes   Occupational Therapy Consult: No   Speech Therapy Consult: No   Current Support Network: Relative's Home (Lives with daughter and 3 small children)    Reason for Referral: DCP Rounds   History Provided By: Child/Family, Patient (Patient and Daughter [name] [name] [phone])   Patient Orientation: Alert and Oriented   Cognition: Alert   Support System Response: Cooperative, Concerned (Daughters:  [name] [name] [phone] and [name] [name] [phone])   Previous Living Arrangement: Lives with Family Dependent   Home Accessibility: Steps (3 steps)   Prior Functional Level: Assistance with the following:, Dressing, Cooking, Mobility, Housework, Toileting, Shopping, Feeding, Bathing   Current Functional Level: Assistance with the following:, Dressing, Cooking, Mobility, Housework, Toileting, Bathing, Feeding, Shopping   Primary Language: English   Can patient return to prior living arrangement: Yes   Ability to make needs known:: Fair  Family able to assist with home care needs:: Yes               Types of Needs Identified: Disease Management Education, Treatment Education   Anticipated Discharge Needs: Home Health Services   Confirm Follow Up Transport: Family       Plan discussed with Pt/Family/Caregiver: Yes (Daughters:  [name] FlatteryGariel and [name] [name])          DISCHARGE LOCATION   Discharge Placement: Home with home health

## 2017-02-19 NOTE — Progress Notes (Signed)
Problem: Pressure Injury - Risk of  Goal: *Prevention of pressure injury  Document Braden Scale and appropriate interventions in the flowsheet.   Outcome: Progressing Towards Goal  Pressure Injury Interventions:            Activity Interventions: Pressure redistribution bed/mattress(bed type)    Mobility Interventions: Pressure redistribution bed/mattress (bed type)    Nutrition Interventions: Document food/fluid/supplement intake, Discuss nutritional consult with provider, Offer support with meals,snacks and hydration    Friction and Shear Interventions: Lift sheet

## 2017-02-19 NOTE — Progress Notes (Signed)
PHYSICAL THERAPY EVALUATION     Patient: [name] [name] (66 y.o. female)  Room: PC01/PC01  [x]   Patient DOB Verified    Date of Admission: 02/18/2017   Primary Diagnosis: Syncope  Syncope and collapse  Hypokalemia  Left rib fracture  ANEMIA  Procedure(s) (LRB):  COLONOSCOPY (N/A)     Insurance: Payor: [employer] / Plan: [facility] CCCP [employer] / Product Type: Managed Care Medicaid /      Date: 02/19/2017  In time: 14:29  Out time:  14:53      Precautions: Falls.      Equipment: walker    ASSESSMENT:     Based on the objective data described below, the patient presents with  - decreased mobility affecting function  - decreased independence with functional mobility  - decreased tolerance to sustained activity  - deconditioning  - unsteady gait  - chronic pain   - c/o left quadrant abdominal pain and nausea  - motivated to participate in PT to return to prior level of function     Patient???s rehabilitation potential is considered to be Good for below stated goals.    Recommendations:  Recommend continued physical therapy during acute stay. Occupational Therapy. Recommend out of bed activity to counteract ill effects of bedrest, with assistance from staff as needed.  Discharge Recommendations: Patient does not want to go to rehab. Agrees to HHPT and wants to return to familiar home environment with family support  Further Equipment Recommendations for Discharge: no needs.     PLAN:     Patient will benefit from skilled Physical Therapy intervention to address the above impairments to return to prior level of function.    Patient will achieve PT goals by discharge.  PT goals:     - Patient will be independent with bed mobility.  - Patient will be independent with transfers in preparation for OOB activities and ambulation.  - Patient will ambulate with modified independence for 100 feet with the least restrictive device to promote functional independence.    - Patient will demonstrate good balance.  - Patient will demonstrate good activity tolerance during functional activities.  - Patient will demonstrate good safety awareness during functional activities.  - Patient will be independent with lower extremity home exercise program to increase strength and endurance.  - Patient will utilize energy conservation techniques during functional activities.    Planned Interventions:     Skilled Physical Therapy services will provide functional mobility training, therapeutic exercises, therapeutic activities, patient/caregiver education as indicated.  Skilled Physical Therapy services will modify and progress therapeutic interventions, address functional mobility deficits, address ROM and strength deficits, analyze and cue movement patterns and assess and modify postural abnormalities to reach the stated goals.    Patient will be followed by physical therapy to address goals while hospitalized as patient's status and schedule permit.       ** Physical Therapy order received. The role of Physical Therapy was explained to the patient. Opportunity for questions and clarification was provided. Pt. agreed to participate in PT evaluation. **  SUBJECTIVE:     Patient agreed to PT. "thank you."    Pain Assessment: Not rated  Pain Location:  C/o left quadrant abdominal pain and nausea; c/o chronic pain, no specific area     OBJECTIVE DATA SUMMARY:     Orders, labs, and chart reviewed on [name] [name]. Communicated with nurse.    Present illness history:   Patient Active Problem List  Diagnosis Date Noted   ??? Syncope and collapse 02/18/2017   ??? Hypokalemia 02/18/2017   ??? Syncope 02/18/2017   ??? Left rib fracture 02/18/2017   ??? Pneumoperitoneum 12/26/2015   ??? Acute CVA (cerebrovascular accident) (HCC) 07/17/2015   ??? Chest pain 05/19/2015   ??? Gastrointestinal hemorrhage 01/01/2015   ??? GI bleed 11/26/2014   ??? Back pain, lumbosacral 09/11/2011   ??? Arthritis of knee 01/04/2011    ??? Encounter for long-term (current) use of other medications 01/04/2011   ??? Abdominal pain, generalized 01/04/2011   ??? 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 Lupus 12/03/2010   ??? Osteoarthrosis, unspecified whether generalized or localized, unspecified site 12/03/2010   ??? Neuralgia, neuritis, and radiculitis, unspecified 12/03/2010   ??? Myalgia and myositis, unspecified 12/03/2010   ??? Pain in limb 12/03/2010   ??? Pain in joint, multiple sites 12/03/2010   ??? Type II or unspecified type diabetes mellitus without mention of complication, not stated as uncontrolled 12/03/2010   ??? Rib pain 10/10/2010      Previous medical history:   Past Medical History:   Diagnosis Date   ??? Acute hypercapnic respiratory failure (HCC) 02/05/2013    W/ Encephalopathy requiring BiPAP, Etiology Uncertain however Possible Narcotic Benzodiazipine Related?    ??? Adjustment disorder with mixed anxiety and depressed mood    ??? Aneurysm of internal carotid artery    ??? Arthritis of knee    ??? Asthma    ??? Autoimmune disease (HCC)    ??? CAD (coronary artery disease)    ??? Cervicalgia    ??? Chest pain 03/17/2014    Dobutamine Stress Echo: NORMAL WALL MOTION AND GLOBAL SYSTOLIC FUNCTION AT REST AND AFTER?? STRESS/EXERCISE WITH APPROPRIATE AUGMENTATION OF FUNCTION IN ALL SEGMENTS. NO EVIDENCE OF INDUCIBLE ISCHEMIA AT ADEQUATE HEART RATE WITH DOBUTAMINE STRESS.??            ??? Chronic low back pain 10/22/2014    [facility] MRI Lumbar w/o contrast: Moderate facet arthropathy at L4-L5 and L5-S1. Mild/moderate foraminal stenosis at these levels. Disc bulge with annular tear at L4-L5.    ??? Chronic pain syndrome    ??? Coccydynia    ??? Diabetes mellitus     NIDDM   ??? DVT (deep venous thrombosis) (HCC) 05/19/2007    [facility] CTA: 1. Small nonocclusive pulmonary embolus in a subsegmental branch to the left lower lobe. Larger filling defect in a segmental branch to the right lower lobe, not as low in attenuation as is normally seen  with a pulmonary embolus, however is seen in multiple planes and remains concerning for an additional nonocclusive pulmonary embolus.   ??? Fibromyalgia    ??? Gastric ulcer    ??? Gastritis 01/03/2015    [facility] Admission, EGD + H. Pylori/Gastritis   ??? Generalized osteoarthritis of multiple sites    ??? GI bleed 01/01/2015    [facility] Admit 5/2-01/07/2015   ??? H. pylori infection 01/03/2015    [facility] Admission, EGD + H. Pylori/Gastritis   ??? Hemiparesis, right (HCC)    ??? Hyperlipidemia    ??? Hypertension    ??? Irregular sleep-wake rhythm    ??? Knee joint pain    ??? Lumbar spondylosis     Orthopedic Spine Surgeon: Dr. Ree Kida L. Henrene Hawking    ??? Muscle spasm    ??? Normocytic anemia     Chronic Iron Deficiency Anemia   ??? Osteoporosis    ??? Pancreatitis    ??? Peripheral  neuropathy    ??? Pulmonary embolism (HCC)     Multiple   ??? Seizures (HCC)    ??? Sensory ataxia    ??? SLE (systemic lupus erythematosus) (HCC)    ??? Stroke [facility])    ??? Subclinical hypothyroidism    ??? Vitamin D deficiency         Prior Level of Function/Home Situation:   Information was obtained by:   patient   Home environment: Patient's daughter and 3 children live with patient in 1 story home. 1 step to enter.  Prior level of function: Daughter assists with ADLs. Patient ambulates with rollator. Per nurse/dtr, patient with increased falls over the past week.  Home equipment: rollator    Patient found:     Patient found in PCU bed 01, PCU monitoring lines, nurse present, (-) bed/chair alarm.    COGNITIVE STATUS:     Mental Status: Alert and oriented  Communication: soft spoken and difficult to understand at times  Follows commands: grossly intact  General Cognition: delayed responses  Safety/Judgement: Awareness of environment and need for assistance    EXTREMITIES ASSESSMENT:      Sensation:  Intact    Strength/Range Of Motion:    WFL based on observation - formal testing limited by pain, ?effort?  Weak grip on R which patient contributes to remote h/o 2 strokes.   Bilateral shoulder flexion minimal. BUE/BLE tense and stiff with decreased ROM.    FUNCTIONAL MOBILITY AND BALANCE STATUS:      Focus on bed mobility, transfers and gait training. Provided cueing for hand placement, technique, and safety.      Bed mobility:    Functional Status   Comments   Rolling max assist  Logroll d/t abdominal pain   Supine to sit max assist        Sit to supine not tested     Scooting max assist            Transfers:    Functional Status   Comments   Sit to stand max assist     Stand to sit max assist          Balance   Good   Fair   Poor   Unable   Comments   Sitting static []   [x]   []   []      Sitting dynamic []   [x]   []   []      Standing static []   [x]   []   []   Needs assistive device   Standing dynamic []   [x]   []   []   Needs assistive device   Reaction Time []   [x]   []   []        Ambulation/Gait Training:    Distance  15 feet   Analysis  decreased cadence, decreased step height/length, downward gaze, flexed posture and flexed BLE, shuffling gait    Assistance  mod assist and cueing to promote proper body mechanics    DME  walker and wheelchair following closely        THERAPEUTIC ACTIVITIES     Patient received/participated in 8 minutes of treatment (therapeutic exercises/activities/gait training) and/or educational instruction during/immediately following PT evaluation.    Balance Activities:   Needs assistive device; h/o frequent falls; limited by chronic pain     Activity Tolerance:   135/79, HR 75;   C/o nausea; c/o chronic pain    Final Location:   Patient seated in wheelchair in process of moving out of PCU, all needs within reach, and agrees  to call for assistance as needed, nursing present.    COMMUNICATION/EDUCATION:      Education: Ill effects of bedrest, benefits of activity while hospitalized, OOB with assist from staff, activity pacing, energy conservation, call staff for assistance, HEP, positioning, safety, DME, disposition, role of PT, PT plan of care.     Readiness to learn indicated by: trying to perform skills and verbalized understanding    Comprehension: Patient communicated comprehension    Please refer to care plan and patient education section for further details.    Thank you for this referral.  Siegrune H. Dareen PianoWolborsky, PT, DPT

## 2017-02-19 NOTE — Progress Notes (Signed)
2D echocardiogram was completed.

## 2017-02-20 ENCOUNTER — Inpatient Hospital Stay: Payer: PRIVATE HEALTH INSURANCE | Primary: Internal Medicine

## 2017-02-20 LAB — CBC WITH AUTOMATED DIFF
BASOPHILS: 0.6 % (ref 0–3)
EOSINOPHILS: 2.2 % (ref 0–5)
HCT: 28.4 % — ABNORMAL LOW (ref 37.0–50.0)
HGB: 8.4 gm/dl — ABNORMAL LOW (ref 13.0–17.2)
IMMATURE GRANULOCYTES: 0.2 % (ref 0.0–3.0)
LYMPHOCYTES: 28.2 % (ref 28–48)
MCH: 22.1 pg — ABNORMAL LOW (ref 25.4–34.6)
MCHC: 29.6 gm/dl — ABNORMAL LOW (ref 30.0–36.0)
MCV: 74.7 fL — ABNORMAL LOW (ref 80.0–98.0)
MONOCYTES: 7.7 % (ref 1–13)
MPV: 9.4 fL (ref 6.0–10.0)
NEUTROPHILS: 61.1 % (ref 34–64)
NRBC: 0 (ref 0–0)
PLATELET: 304 10*3/uL (ref 140–450)
RBC: 3.8 M/uL (ref 3.60–5.20)
RDW-SD: 54 — ABNORMAL HIGH (ref 36.4–46.3)
WBC: 6.4 10*3/uL (ref 4.0–11.0)

## 2017-02-20 LAB — METABOLIC PANEL, COMPREHENSIVE
ALT (SGPT): 10 U/L — ABNORMAL LOW (ref 12–78)
AST (SGOT): 15 U/L (ref 15–37)
Albumin: 3.3 gm/dl — ABNORMAL LOW (ref 3.4–5.0)
Alk. phosphatase: 84 U/L (ref 45–117)
Anion gap: 8 mmol/L (ref 5–15)
BUN: 4 mg/dl — ABNORMAL LOW (ref 7–25)
Bilirubin, total: 0.7 mg/dl (ref 0.2–1.0)
CO2: 27 mEq/L (ref 21–32)
Calcium: 8.4 mg/dl — ABNORMAL LOW (ref 8.5–10.1)
Chloride: 106 mEq/L (ref 98–107)
Creatinine: 0.6 mg/dl (ref 0.6–1.3)
GFR est AA: >60
GFR est non-AA: >60
Glucose: 130 mg/dl — ABNORMAL HIGH (ref 74–106)
Potassium: 4 mEq/L (ref 3.5–5.1)
Protein, total: 6.9 gm/dl (ref 6.4–8.2)
Sodium: 141 mEq/L (ref 136–145)

## 2017-02-20 LAB — TSH 3RD GENERATION: TSH: 3.04 u[IU]/mL (ref 0.358–3.740)

## 2017-02-20 MED ORDER — PREGABALIN 25 MG CAP
25 mg | Freq: Two times a day (BID) | ORAL | Status: DC
Start: 2017-02-20 — End: 2017-02-20
  Administered 2017-02-20: 16:00:00 via ORAL

## 2017-02-20 MED ORDER — PANTOPRAZOLE 40 MG TAB, DELAYED RELEASE
40 mg | ORAL_TABLET | Freq: Two times a day (BID) | ORAL | 3 refills | Status: DC
Start: 2017-02-20 — End: 2017-11-16

## 2017-02-20 MED ORDER — ONDANSETRON HCL 4 MG TAB
4 mg | ORAL_TABLET | Freq: Three times a day (TID) | ORAL | 0 refills | Status: AC | PRN
Start: 2017-02-20 — End: ?

## 2017-02-20 MED ORDER — METHOCARBAMOL 750 MG TAB
750 mg | ORAL_TABLET | Freq: Four times a day (QID) | ORAL | 0 refills | Status: DC
Start: 2017-02-20 — End: 2017-11-16

## 2017-02-20 MED ORDER — PREDNISONE 20 MG TAB
20 mg | ORAL_TABLET | ORAL | 0 refills | Status: DC
Start: 2017-02-20 — End: 2017-11-16

## 2017-02-20 MED ORDER — AMITRIPTYLINE 50 MG TAB
50 mg | ORAL_TABLET | Freq: Every evening | ORAL | 1 refills | Status: DC
Start: 2017-02-20 — End: 2017-11-16

## 2017-02-20 MED ORDER — PREGABALIN 25 MG CAP
25 mg | ORAL_CAPSULE | Freq: Two times a day (BID) | ORAL | 1 refills | Status: DC
Start: 2017-02-20 — End: 2017-11-16

## 2017-02-20 MED FILL — SUCRALFATE 100 MG/ML ORAL SUSP: 100 mg/mL | ORAL | Qty: 10

## 2017-02-20 MED FILL — LYRICA 25 MG CAPSULE: 25 mg | ORAL | Qty: 1

## 2017-02-20 MED FILL — LAMOTRIGINE 100 MG TAB: 100 mg | ORAL | Qty: 1

## 2017-02-20 MED FILL — PANTOPRAZOLE 40 MG TAB, DELAYED RELEASE: 40 mg | ORAL | Qty: 1

## 2017-02-20 MED FILL — ONDANSETRON (PF) 4 MG/2 ML INJECTION: 4 mg/2 mL | INTRAMUSCULAR | Qty: 2

## 2017-02-20 MED FILL — BD POSIFLUSH NORMAL SALINE 0.9 % INJECTION SYRINGE: INTRAMUSCULAR | Qty: 10

## 2017-02-20 MED FILL — NS WITH POTASSIUM CHLORIDE 40 MEQ/L IV: 40 mEq/L | INTRAVENOUS | Qty: 1000

## 2017-02-20 MED FILL — SIMVASTATIN 40 MG TAB: 40 mg | ORAL | Qty: 2

## 2017-02-20 MED FILL — OXYCODONE 5 MG TAB: 5 mg | ORAL | Qty: 1

## 2017-02-20 NOTE — Discharge Summary (Signed)
Discharge Summary   Admit Date: 02/18/2017  Discharge Date:  02/20/2017    Patient ID:  [name] [name]  66 y.o.  April 30, 1951    Chief Complaint   Patient presents with   ??? Fall   ??? Back Pain   ??? Flank Pain     left       Patient Active Problem List    Diagnosis Date Noted   ??? Syncope and collapse 02/18/2017   ??? Hypokalemia 02/18/2017   ??? Syncope 02/18/2017   ??? Left rib fracture 02/18/2017   ??? Pneumoperitoneum 12/26/2015   ??? Acute CVA (cerebrovascular accident) (HCC) 07/17/2015   ??? Chest pain 05/19/2015   ??? Gastrointestinal hemorrhage 01/01/2015   ??? GI bleed 11/26/2014   ??? Back pain, lumbosacral 09/11/2011   ??? Arthritis of knee 01/04/2011   ??? Encounter for long-term (current) use of other medications 01/04/2011   ??? Abdominal pain, generalized 01/04/2011   ??? 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 Lupus 12/03/2010   ??? Osteoarthrosis, unspecified whether generalized or localized, unspecified site 12/03/2010   ??? Neuralgia, neuritis, and radiculitis, unspecified 12/03/2010   ??? Myalgia and myositis, unspecified 12/03/2010   ??? Pain in limb 12/03/2010   ??? Pain in joint, multiple sites 12/03/2010   ??? Type II or unspecified type diabetes mellitus without mention of complication, not stated as uncontrolled 12/03/2010   ??? Rib pain 10/10/2010       Discharge Diagnosis:  Syncope. Hypokalemia    Current Discharge Medication List      START taking these medications    Details   pregabalin (LYRICA) 25 mg capsule Take 1 Cap by mouth two (2) times a day. Max Daily Amount: 50 mg.  Qty: 60 Cap, Refills: 1    Associated Diagnoses: Closed fracture of one rib of left side, initial encounter         CONTINUE these medications which have CHANGED    Details   amitriptyline (ELAVIL) 50 mg tablet Take 1 Tab by mouth nightly.  Qty: 30 Tab, Refills: 1      methocarbamol (ROBAXIN) 750 mg tablet Take 2 Tabs by mouth four (4) times daily.  Qty: 30 Tab, Refills: 0      ondansetron hcl (ZOFRAN) 4 mg tablet Take 1 Tab  by mouth every eight (8) hours as needed for Nausea.  Qty: 15 Tab, Refills: 0      pantoprazole (PROTONIX) 40 mg tablet Take 1 Tab by mouth two (2) times a day.  Qty: 60 Tab, Refills: 3      predniSONE (DELTASONE) 20 mg tablet Tale 3 tabs daily for 3 days, then 2 tabs daily for 3 days, then one tab daily for three days by mouth  Qty: 18 Tab, Refills: 0         CONTINUE these medications which have NOT CHANGED    Details   cyclobenzaprine (FLEXERIL) 10 mg tablet Take 10 mg by mouth three (3) times daily as needed for Muscle Spasm(s).      apixaban (ELIQUIS) 5 mg tablet Take 1 Tab by mouth every twelve (12) hours.  Qty: 30 Tab, Refills: 0      sucralfate (CARAFATE) 100 mg/mL suspension Take 1 tsp by mouth two (2) times a day.      ascorbic acid, vitamin C, (VITAMIN C) 250 mg tablet Take 1 Tab by mouth two (2) times a day. Take with iron pill  Qty: 60 Tab, Refills: 3  oxyCODONE IR (OXY-IR) 15 mg immediate release tablet Take 1 Tab by mouth four (4) times daily. Max Daily Amount: 60 mg.  Qty: 10 Tab, Refills: 0      simvastatin (ZOCOR) 80 mg tablet Take 80 mg by mouth nightly.      lamoTRIgine (LAMICTAL) 100 mg tablet Take 1 Tab by mouth two (2) times a day.  Qty: 60 Tab, Refills: 0      Cholecalciferol, Vitamin D3, (VITAMIN D) 1,000 unit Cap Take 50,000 Units by mouth every seven (7) days.         STOP taking these medications       naproxen (NAPROSYN) 500 mg tablet Comments:   Reason for Stopping:               Operative Procedures:  none    Consultants:  none    Hospital Course:  Ms [name] [name] is a 66 year old woman with past history of gartric bypass, fibromyalgia, DJD. Chronic back pain who presents from home after a syncopal event. She reports six such episodes over the previous week. She fell to the floor landing on her left side and CO pain in the left side of her chest that is worse with movement and with deep inspiration. A chest Xray taken in the ED confirms a left sided rib fracture. He also had hypokalemia  on routine labs done in the ED. A CT of the abd/pelvis with contrast reveals chronic appearing bilary dilation and evidence of her previous gastric bypass and cholecystectomy and was negative for acute changes or bowel obstruction. Constipation was evident. A CT of the head was done that is also unremarkable. An ECG showed NSR.     Ms [name] [name] was admitted and monitored on telemetry. Potassium was replaced per standard routine. Repeat K and Hb levels have been normal. She had mild anemia on prsentation. She has not reported any melena or signs of acute blood loss. Her Hb is improved since admission without the need for transfusion. Her home medications have been reconciled and are continued without changes. She also CO phantom pain at the sight of previous foot surgery. Lyrica is added to her med list for her to try at home for her neuropathic and chronic pain. An ECHO has been done that is normal.     She is feeling generally better and DC to home with outpatient FU with her PCP and with her pain clinic for continued outpatient support and routine health maintenance as before.         Physical Exam on Discharge:  Visit Vitals   ??? BP 137/79 (BP 1 Location: Right arm, BP Patient Position: Sitting)   ??? Pulse 82   ??? Temp 98.4 ??F (36.9 ??C)   ??? Resp 18   ??? Ht 5\' 7"  (1.702 m)   ??? Wt 66.6 kg (146 lb 13.2 oz)   ??? SpO2 94%   ??? BMI 23 kg/m2         Constitutional: well developed, nourished, no distress and alert and oriented x 3   HENT: atraumatic, nose normal, normocephalic, left exterior ear normal, right external ear normal and oropharynx clear and moist   Eyes: conjunctiva normal, EOM normal and PERRL   Neck: ROM normal, supple, trachea normal and cervical adenopathy   Cardiovascular: heart sounds normal, intact distal pulses, normal rate and regular rhythm   Pulmonary/Chest Wall: breath sounds normal and effort normal   Abdominal: appearance normal, bowel sounds normal and soft  Genitourinary/Anorectal: deferred    Musculoskeletal: normal ROM   Neurological: awake, alert and oriented x 3 and gait normal   Skin: dry, intact and warm   Psych:  appropriate               Most Recent BMP and CBC:    Basic Metabolic Profile   Lab Results   Component Value Date/Time    Sodium 141 02/20/2017 05:40 AM    Sodium 147 (H) 02/18/2017 09:20 PM    Sodium 138 08/22/2016 05:38 PM    Potassium 4.0 02/20/2017 05:40 AM    Potassium 3.6 02/19/2017 09:02 AM    Potassium 3.8 02/19/2017 06:49 AM    Chloride 106 02/20/2017 05:40 AM    Chloride 114 (H) 02/18/2017 09:20 PM    Chloride 104 08/22/2016 05:38 PM    CO2 27 02/20/2017 05:40 AM    CO2 25 02/18/2017 09:20 PM    CO2 26 08/22/2016 05:38 PM    Anion gap 8 02/20/2017 05:40 AM    Anion gap 8 02/18/2017 09:20 PM    Anion gap 6 02/26/2015 08:20 PM    Glucose 130 (H) 02/20/2017 05:40 AM    Glucose 78 02/18/2017 09:20 PM    Glucose 90 08/22/2016 05:38 PM    BUN 4 (L) 02/20/2017 05:40 AM    BUN 10 02/18/2017 09:20 PM    BUN 6 (L) 08/22/2016 05:38 PM    Creatinine 0.6 02/20/2017 05:40 AM    Creatinine 0.6 02/18/2017 09:20 PM    Creatinine 0.6 08/22/2016 05:38 PM    BUN/Creatinine ratio 17 02/26/2015 08:20 PM    BUN/Creatinine ratio 9 (L) 04/28/2011 09:01 PM    GFR est AA >60.0 02/20/2017 05:40 AM    GFR est AA >60.0 02/18/2017 09:20 PM    GFR est AA >60.0 08/22/2016 05:38 PM    GFR est non-AA >60 02/20/2017 05:40 AM    GFR est non-AA >60 02/18/2017 09:20 PM    GFR est non-AA >60 08/22/2016 05:38 PM    Calcium 8.4 (L) 02/20/2017 05:40 AM    Calcium 8.3 (L) 02/18/2017 09:20 PM    Calcium 9.1 08/22/2016 05:38 PM           CBC w/Diff    Lab Results   Component Value Date/Time    WBC 6.4 02/20/2017 05:40 AM    WBC 5.6 02/18/2017 09:20 PM    WBC 7.8 08/22/2016 05:38 PM    HGB 8.4 (L) 02/20/2017 05:40 AM    HGB 8.1 (L) 02/19/2017 06:58 PM    HGB 7.6 (L) 02/19/2017 06:49 AM    HCT 28.4 (L) 02/20/2017 05:40 AM    HCT 26.9 (L) 02/19/2017 06:58 PM    HCT 25.1 (L) 02/19/2017 06:49 AM    PLATELET 304 02/20/2017  05:40 AM    PLATELET 336 02/18/2017 09:20 PM    PLATELET 340 08/22/2016 05:38 PM    MCV 74.7 (L) 02/20/2017 05:40 AM    MCV 73.8 (L) 02/18/2017 09:20 PM    MCV 82.4 08/22/2016 05:38 PM       Recent Labs      02/20/17   0540  02/19/17   1858  02/19/17   0649  02/18/17   2120   WBC  6.4   --    --   5.6   RBC  3.80   --    --   3.59*   HGB  8.4*  8.1*  7.6*  8.0*   HCT  28.4*  26.9*  25.1*  26.5*   PLT  304   --    --   336   GRANS  61.1   --    --   62.5   LYMPH  28.2   --    --   27.2*   MONOS  7.7   --    --   5.8   EOS  2.2   --    --   3.2   BASOS  0.6   --    --   0.9              Condition at discharge: Afebrile  Ambulating  Eating, Drinking, Voiding  Stable    Disposition:  Home with home health.     Home or Self Care with home health    PCP:  Lynnae SandhoffJodi Newcombe, MD

## 2017-02-20 NOTE — Other (Addendum)
----------DocumentID: [id number]------------------------------------------------              [facility]                       Patient Education Report         Name: [name] [name], [name] [name]                  Date: 02/19/2017    MRN: [id number]                    Time: 12:32:05 AM         Patient ordered video: 'Patient Safety: Stay Safe While you are in the Hospital'    from [email or web address] via phone number: [phone] at 12:32:05 AM    Description: This program outlines some of the precautions patients can take to ensure a speedy recovery without extra complications. The video emphasizes the importance of communicating with the healthcare team.    ----------DocumentID: [id number]------------------------------------------------              [facility]                       Patient Education Report         Name: [name] [name], [name] [name]                  Date: 02/19/2017    MRN: [id number]                    Time: 12:32:41 AM         Patient ordered video: 'Patient Safety: Stay Safe While you are in the Hospital'    from [email or web address] via phone number: [phone] at 12:32:41 AM    Description: This program outlines some of the precautions patients can take to ensure a speedy recovery without extra complications. The video emphasizes the importance of communicating with the healthcare team.    ----------DocumentID: [id number]------------------------------------------------              [facility]                       Patient Education Report         Name: [name] [name], [name] [name]                  Date: 02/19/2017    MRN: [id number]                    Time: 12:38:41 AM         Patient ordered video: 'Patient Safety: Stay Safe While you are in the Hospital'    from [email or web address] via phone number: [phone] at 12:38:41 AM    Description: This program outlines some of the precautions patients can take to ensure a speedy recovery without extra complications. The video  emphasizes the importance of communicating with the healthcare team.    ----------DocumentID: [id number]------------------------------------------------                       [facility]          Patient Education Report - Discharge Summary        Date: 02/20/2017   Time: 12:28:56 PM   Name: [name] [name], [name] [name]   MRN: [id number]      Account Number: [id number]      Education History:        Patient ordered  video: 'Patient Safety: Stay Safe While you are in the Hospital' from [id number] on 02/19/2017 12:32:05 AM    Patient ordered video: 'Patient Safety: Stay Safe While you are in the Hospital' from [email or web address] on 02/19/2017 12:32:41 AM    Patient ordered video: 'Patient Safety: Stay Safe While you are in the Hospital' from [email or web address] on 02/19/2017 12:38:41 AM

## 2017-02-20 NOTE — Progress Notes (Signed)
[facility]  [facility] En[facility]    Patient???s Name: [name] [name]           Date of Birth: 01/30/1951    Primary Diagnosis: Syncope  Syncope and collapse  Hypokalemia  Left rib fracture  ANEMIA                    Admit Date: 02/18/2017    Date of Face to Face:  February 20, 2017                    Medical Record Number: [id number]     Attending: Marlene BastJeffrey G Holladay, MD      Physician Attestation:  To be filled out by physician who conducted Face-to Face encounter.    Current Problem List:  The encounter with the patient was in whole, or in part, for the following medical condition, which is the primary reason home health care (list medical condition):    Patient Active Hospital Problem List:   Syncope and collapse (02/18/2017)                                  Face to Face Encounter findings are related to primary reason for home care:   yes.     1. I certify that the patient needs intermittent care as follows: skilled nursing care:  skilled observation/assessment, patient education and complex care plan management  physical therapy: strengthening, stretching/ROM, transfer training, gait/stair training, balance training and pt/caregiver education  occupational therapy:  ADL safety (ie. cooking, bathing, dressing), ROM and pt/caregiver education    2. I certify that this patient is homebound, that is: 1) patient requires the use of a walker device, special transportation, or assistance of another to leave the home; or 2) patient's condition makes leaving the home medically contraindicated; and 3) patient has a normal inability to leave the home and leaving the home requires considerable and taxing effort.  Patient may leave the home for infrequent and short duration for medical reasons, and occasional absences for non-medical reasons. Homebound status is due to the following functional limitations: Patient with strength deficits limiting the performance of all ADL's without  caregiver assistance or the use of an assistive device.    3. I certify that this patient is under my care and that I, or a nurse practitioner or physician???s assistant, or clinical nurse specialist, or certified nurse midwife, working with me, had a Face-to-Face Encounter that meets the physician Face-to-Face Encounter requirements.  The following are the clinical findings from the Face-to-Face encounter that support the need for skilled services and is a summary of the encounter:     See discharge summary    Electronically signed:  Gamaliel Charney L. Kerrie BuffaloWestfall, RN  02/20/2017      THE FOLLOWING TO BE COMPLETED BY THE  PHYSICIAN:    I concur with the findings described above from the F2F encounter that this patient is homebound and in need of a skilled service.    Electronically signed:  Certifying Physician:

## 2017-02-20 NOTE — Discharge Summary (Signed)
Discharge Summary   Admit Date: 02/18/2017  Discharge Date:  02/20/2017    Patient ID:  [name] [name]  66 y.o.  11/01/1950    Chief Complaint   Patient presents with   ??? Fall   ??? Back Pain   ??? Flank Pain     left       Patient Active Problem List    Diagnosis Date Noted   ??? Syncope and collapse 02/18/2017   ??? Hypokalemia 02/18/2017   ??? Syncope 02/18/2017   ??? Left rib fracture 02/18/2017   ??? Pneumoperitoneum 12/26/2015   ??? Acute CVA (cerebrovascular accident) (HCC) 07/17/2015   ??? Chest pain 05/19/2015   ??? Gastrointestinal hemorrhage 01/01/2015   ??? GI bleed 11/26/2014   ??? Back pain, lumbosacral 09/11/2011   ??? Arthritis of knee 01/04/2011   ??? Encounter for long-term (current) use of other medications 01/04/2011   ??? Abdominal pain, generalized 01/04/2011   ??? 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 Lupus 12/03/2010   ??? Osteoarthrosis, unspecified whether generalized or localized, unspecified site 12/03/2010   ??? Neuralgia, neuritis, and radiculitis, unspecified 12/03/2010   ??? Myalgia and myositis, unspecified 12/03/2010   ??? Pain in limb 12/03/2010   ??? Pain in joint, multiple sites 12/03/2010   ??? Type II or unspecified type diabetes mellitus without mention of complication, not stated as uncontrolled 12/03/2010   ??? Rib pain 10/10/2010       Discharge Diagnosis:  Syncope. Hypokalemia    Current Discharge Medication List      START taking these medications    Details   pregabalin (LYRICA) 25 mg capsule Take 1 Cap by mouth two (2) times a day. Max Daily Amount: 50 mg.  Qty: 60 Cap, Refills: 1    Associated Diagnoses: Closed fracture of one rib of left side, initial encounter         CONTINUE these medications which have CHANGED    Details   amitriptyline (ELAVIL) 50 mg tablet Take 1 Tab by mouth nightly.  Qty: 30 Tab, Refills: 1      methocarbamol (ROBAXIN) 750 mg tablet Take 2 Tabs by mouth four (4) times daily.  Qty: 30 Tab, Refills: 0       ondansetron hcl (ZOFRAN) 4 mg tablet Take 1 Tab by mouth every eight (8) hours as needed for Nausea.  Qty: 15 Tab, Refills: 0      pantoprazole (PROTONIX) 40 mg tablet Take 1 Tab by mouth two (2) times a day.  Qty: 60 Tab, Refills: 3      predniSONE (DELTASONE) 20 mg tablet Tale 3 tabs daily for 3 days, then 2 tabs daily for 3 days, then one tab daily for three days by mouth  Qty: 18 Tab, Refills: 0         CONTINUE these medications which have NOT CHANGED    Details   cyclobenzaprine (FLEXERIL) 10 mg tablet Take 10 mg by mouth three (3) times daily as needed for Muscle Spasm(s).      apixaban (ELIQUIS) 5 mg tablet Take 1 Tab by mouth every twelve (12) hours.  Qty: 30 Tab, Refills: 0      sucralfate (CARAFATE) 100 mg/mL suspension Take 1 tsp by mouth two (2) times a day.      ascorbic acid, vitamin C, (VITAMIN C) 250 mg tablet Take 1 Tab by mouth two (2) times a day. Take with iron pill  Qty: 60 Tab, Refills: 3  oxyCODONE IR (OXY-IR) 15 mg immediate release tablet Take 1 Tab by mouth four (4) times daily. Max Daily Amount: 60 mg.  Qty: 10 Tab, Refills: 0      simvastatin (ZOCOR) 80 mg tablet Take 80 mg by mouth nightly.      lamoTRIgine (LAMICTAL) 100 mg tablet Take 1 Tab by mouth two (2) times a day.  Qty: 60 Tab, Refills: 0      Cholecalciferol, Vitamin D3, (VITAMIN D) 1,000 unit Cap Take 50,000 Units by mouth every seven (7) days.         STOP taking these medications       naproxen (NAPROSYN) 500 mg tablet Comments:   Reason for Stopping:               Operative Procedures:  none    Consultants:  none    Hospital Course:  [name] [name] is a 66 year old woman with past history of gartric bypass, fibromyalgia, DJD. Chronic back pain who presents from home after a syncopal event. She reports six such episodes over the previous week. She fell to the floor landing on her left side and CO pain in the left side of her chest that is worse with movement and with deep  inspiration. A chest Xray taken in the ED confirms a left sided rib fracture. He also had hypokalemia on routine labs done in the ED. A CT of the abd/pelvis with contrast reveals chronic appearing bilary dilation and evidence of her previous gastric bypass and cholecystectomy and was negative for acute changes or bowel obstruction. Constipation was evident. A CT of the head was done that is also unremarkable. An ECG showed NSR.     [name] [name] was admitted and monitored on telemetry. Potassium was replaced per standard routine. Repeat K and Hb levels have been normal. She had mild anemia on prsentation. She has not reported any melena or signs of acute blood loss. Her Hb is improved since admission without the need for transfusion. Her home medications have been reconciled and are continued without changes. She also CO phantom pain at the sight of previous foot surgery. Lyrica is added to her med list for her to try at home for her neuropathic and chronic pain. An ECHO has been done that is normal.     She is feeling generally better and DC to home with outpatient FU with her PCP and with her pain clinic for continued outpatient support and routine health maintenance as before.         Physical Exam on Discharge:  Visit Vitals   ??? BP 137/79 (BP 1 Location: Right arm, BP Patient Position: Sitting)   ??? Pulse 82   ??? Temp 98.4 ??F (36.9 ??C)   ??? Resp 18   ??? Ht 5\' 7"  (1.702 m)   ??? Wt 66.6 kg (146 lb 13.2 oz)   ??? SpO2 94%   ??? BMI 23 kg/m2         Constitutional: well developed, nourished, no distress and alert and oriented x 3   HENT: atraumatic, nose normal, normocephalic, left exterior ear normal, right external ear normal and oropharynx clear and moist   Eyes: conjunctiva normal, EOM normal and PERRL   Neck: ROM normal, supple, trachea normal and cervical adenopathy   Cardiovascular: heart sounds normal, intact distal pulses, normal rate and regular rhythm   Pulmonary/Chest Wall: breath sounds normal and effort normal    Abdominal: appearance normal, bowel sounds normal and soft  Genitourinary/Anorectal: deferred   Musculoskeletal: normal ROM   Neurological: awake, alert and oriented x 3 and gait normal   Skin: dry, intact and warm   Psych:  appropriate               Most Recent BMP and CBC:    Basic Metabolic Profile   Lab Results   Component Value Date/Time    Sodium 141 02/20/2017 05:40 AM    Sodium 147 (H) 02/18/2017 09:20 PM    Sodium 138 08/22/2016 05:38 PM    Potassium 4.0 02/20/2017 05:40 AM    Potassium 3.6 02/19/2017 09:02 AM    Potassium 3.8 02/19/2017 06:49 AM    Chloride 106 02/20/2017 05:40 AM    Chloride 114 (H) 02/18/2017 09:20 PM    Chloride 104 08/22/2016 05:38 PM    CO2 27 02/20/2017 05:40 AM    CO2 25 02/18/2017 09:20 PM    CO2 26 08/22/2016 05:38 PM    Anion gap 8 02/20/2017 05:40 AM    Anion gap 8 02/18/2017 09:20 PM    Anion gap 6 02/26/2015 08:20 PM    Glucose 130 (H) 02/20/2017 05:40 AM    Glucose 78 02/18/2017 09:20 PM    Glucose 90 08/22/2016 05:38 PM    BUN 4 (L) 02/20/2017 05:40 AM    BUN 10 02/18/2017 09:20 PM    BUN 6 (L) 08/22/2016 05:38 PM    Creatinine 0.6 02/20/2017 05:40 AM    Creatinine 0.6 02/18/2017 09:20 PM    Creatinine 0.6 08/22/2016 05:38 PM    BUN/Creatinine ratio 17 02/26/2015 08:20 PM    BUN/Creatinine ratio 9 (L) 04/28/2011 09:01 PM    GFR est AA >60.0 02/20/2017 05:40 AM    GFR est AA >60.0 02/18/2017 09:20 PM    GFR est AA >60.0 08/22/2016 05:38 PM    GFR est non-AA >60 02/20/2017 05:40 AM    GFR est non-AA >60 02/18/2017 09:20 PM    GFR est non-AA >60 08/22/2016 05:38 PM    Calcium 8.4 (L) 02/20/2017 05:40 AM    Calcium 8.3 (L) 02/18/2017 09:20 PM    Calcium 9.1 08/22/2016 05:38 PM           CBC w/Diff    Lab Results   Component Value Date/Time    WBC 6.4 02/20/2017 05:40 AM    WBC 5.6 02/18/2017 09:20 PM    WBC 7.8 08/22/2016 05:38 PM    HGB 8.4 (L) 02/20/2017 05:40 AM    HGB 8.1 (L) 02/19/2017 06:58 PM    HGB 7.6 (L) 02/19/2017 06:49 AM    HCT 28.4 (L) 02/20/2017 05:40 AM     HCT 26.9 (L) 02/19/2017 06:58 PM    HCT 25.1 (L) 02/19/2017 06:49 AM    PLATELET 304 02/20/2017 05:40 AM    PLATELET 336 02/18/2017 09:20 PM    PLATELET 340 08/22/2016 05:38 PM    MCV 74.7 (L) 02/20/2017 05:40 AM    MCV 73.8 (L) 02/18/2017 09:20 PM    MCV 82.4 08/22/2016 05:38 PM       Recent Labs      02/20/17   0540  02/19/17   1858  02/19/17   0649  02/18/17   2120   WBC  6.4   --    --   5.6   RBC  3.80   --    --   3.59*   HGB  8.4*  8.1*  7.6*  8.0*   HCT  28.4*  26.9*  25.1*  26.5*   PLT  304   --    --   336   GRANS  61.1   --    --   62.5   LYMPH  28.2   --    --   27.2*   MONOS  7.7   --    --   5.8   EOS  2.2   --    --   3.2   BASOS  0.6   --    --   0.9              Condition at discharge: Afebrile  Ambulating  Eating, Drinking, Voiding  Stable    Disposition:  Home with home health.     Home or Self Care with home health    PCP:  Lynnae SandhoffJodi Newcombe, MD

## 2017-02-20 NOTE — Progress Notes (Addendum)
02/20/2017 12:03 PM Discharge Plan:  [facility]  Discharge Date:     02/20/2017   [facility]  Confirmed start of care with the home health agency and spoke with:     Trinda Pascalnastasia  Transportation: Family      Patient admitted on 02/18/2017 from *homw** with   Chief Complaint   Patient presents with   ??? Fall   ??? Back Pain   ??? Flank Pain     left        The patient has been admitted to the hospital *1* times in the past 12 months.      Tentative dc plan:   [facility]      Anticipated Discharge Date:   today  PCP: Lynnae SandhoffJodi Newcombe, MD       Face sheet information, address, contact info and insurance verified **y*    Home Environment:    Lives at [facility]  [place] [id number]    [phone].     Prior to admission open services:     none    Extended Emergency Contact Information  Primary Emergency Contact: Kerby LessEdwards,Gariel Lettrel  Address: [address]           [place], [place] [id number] [place]  Home Phone: [phone]  Relation: Daughter        Therapy Recommendations: home PT          Case Management Assessment    ABUSE/NEGLECT SCREENING   Physical Abuse/Neglect: Denies   Sexual Abuse: Denies   Sexual Abuse: Denies   Other Abuse/Issues: Denies          PRIMARY DECISION MAKER   Primary Decision Maker Name: Maia PettiesGariel Derossett           Primary Decision Maker Relationship to Patient: Adult child                   CARE MANAGEMENT INTERVENTIONS   Readmission Interview Completed: Not Applicable   PCP Verified by CM: Yes   Last Visit to PCP:  (June 2018)       Mode of Transport at Discharge:  (dtr)       Transition of Care Consult (CM Consult): Home Health               Discharge Durable Medical Equipment: Yes (cane and walker in home)   Physical Therapy Consult: Yes   Occupational Therapy Consult: No   Speech Therapy Consult: No   Current Support Network: Relative's Home   Reason for Referral: DCP Rounds   History Provided By: Patient   Patient Orientation: Alert and Oriented   Cognition: Alert    Support System Response: Cooperative, Concerned (Daughters:  Maia PettiesGariel Boening [phone] and Taya [phone])   Previous Living Arrangement: Lives with Family Dependent   Home Accessibility: Steps (3 steps)   Prior Functional Level: Assistance with the following:, Shopping, Housework, Mobility   Current Functional Level: Assistance with the following:, Shopping, Housework, Mobility   Primary Language: English   Can patient return to prior living arrangement: Yes   Ability to make needs known:: Good   Family able to assist with home care needs:: Yes               Types of Needs Identified: ADLs/IADLs, Activity/Exercise, Functions Dependency Needs   Anticipated Discharge Needs: Home Health Services   Confirm Follow Up Transport: Family       Plan discussed with Pt/Family/Caregiver: Yes   Freedom of Choice  Offered: Yes      DISCHARGE LOCATION   Discharge Placement: Home with home health

## 2017-02-25 ENCOUNTER — Inpatient Hospital Stay
Admit: 2017-02-25 | Discharge: 2017-02-25 | Disposition: A | Discharge status: Home / Self Care | Payer: PRIVATE HEALTH INSURANCE | Attending: Emergency Medicine

## 2017-02-25 DIAGNOSIS — S2232XA Fracture of one rib, left side, initial encounter for closed fracture: Secondary | ICD-10-CM

## 2017-02-25 MED ORDER — MORPHINE 4 MG/ML SYRINGE
4 mg/mL | INTRAMUSCULAR | Status: DC
Start: 2017-02-25 — End: 2017-02-25

## 2017-02-25 MED ORDER — ONDANSETRON (PF) 4 MG/2 ML INJECTION
4 mg/2 mL | Freq: Once | INTRAMUSCULAR | Status: AC
Start: 2017-02-25 — End: 2017-02-25
  Administered 2017-02-25: 07:00:00 via INTRAVENOUS

## 2017-02-25 MED ORDER — MORPHINE 4 MG/ML SYRINGE
4 mg/mL | INTRAMUSCULAR | Status: AC
Start: 2017-02-25 — End: 2017-02-25
  Administered 2017-02-25: 07:00:00 via INTRAVENOUS

## 2017-02-25 MED ORDER — SODIUM CHLORIDE 0.9 % IJ SYRG
Freq: Once | INTRAMUSCULAR | Status: AC
Start: 2017-02-25 — End: 2017-02-25
  Administered 2017-02-25: 07:00:00 via INTRAVENOUS

## 2017-02-25 MED FILL — MORPHINE 4 MG/ML SYRINGE: 4 mg/mL | INTRAMUSCULAR | Qty: 1

## 2017-02-25 MED FILL — ONDANSETRON (PF) 4 MG/2 ML INJECTION: 4 mg/2 mL | INTRAMUSCULAR | Qty: 2

## 2017-02-25 MED FILL — MORPHINE 4 MG/ML SYRINGE: 4 mg/mL | INTRAMUSCULAR | Qty: 2

## 2017-02-25 NOTE — ED Provider Notes (Signed)
[facility] Care  Emergency Department Treatment Report    Patient: [name] [name] Age: 66 y.o. Sex: female    Date of Birth: 04/16/1951 Admit Date: 02/25/2017 PCP: Lynnae Sandhoff, MD   MRN: [id number]  CSN: [id number]  Attending: Wynelle Bourgeois, MD   Room: EX52/WU13 Time Dictated: 4:29 AM APP: Ahmed Prima, PA-C     Chief Complaint   Chief Complaint   Patient presents with   ??? Rib Pain       History of Present Illness   66 y.o. female complaining of severe 10/10 constant left lower lateral rib pain. Pain has been gradually worsening since discharged from the hospital on 02/20/17. Admitted for syncope and fall and diagnosed with left 9th rib fracture. Prescribed oxycodone and Flexeril. Can't recall when she last took pain medication or how often she's been taking it. Her daughter usually helps with her medications. No new injury. No cough, fever, or upper respiratory symptoms. Pain worsens with movement and when it is severe like this it makes her feel short of breath.    Review of Systems   Constitutional: No fever, chills  ENT: No URI symptoms  RESP: See above. No cough, wheeze  CV: No chest pain  GI: No nausea, vomiting, diarrhea, abdominal pain  GU: No dysuria, frequency, urgency  MSK: See above. Chronic back pain, no acute worsening  SKIN: No rashes    Past Medical/Surgical History     Past Medical History:   Diagnosis Date   ??? Acute hypercapnic respiratory failure (HCC) 02/05/2013    W/ Encephalopathy requiring BiPAP, Etiology Uncertain however Possible Narcotic Benzodiazipine Related?    ??? Adjustment disorder with mixed anxiety and depressed mood    ??? Aneurysm of internal carotid artery    ??? Arthritis of knee    ??? Asthma    ??? Autoimmune disease (HCC)    ??? CAD (coronary artery disease)    ??? Cervicalgia    ??? Chest pain 03/17/2014    Dobutamine Stress Echo: NORMAL WALL MOTION AND GLOBAL SYSTOLIC FUNCTION AT REST AND AFTER?? STRESS/EXERCISE WITH APPROPRIATE AUGMENTATION OF  FUNCTION IN ALL SEGMENTS. NO EVIDENCE OF INDUCIBLE ISCHEMIA AT ADEQUATE HEART RATE WITH DOBUTAMINE STRESS.??            ??? Chronic low back pain 10/22/2014    [facility] MRI Lumbar w/o contrast: Moderate facet arthropathy at L4-L5 and L5-S1. Mild/moderate foraminal stenosis at these levels. Disc bulge with annular tear at L4-L5.    ??? Chronic pain syndrome    ??? Coccydynia    ??? Diabetes mellitus     NIDDM   ??? DVT (deep venous thrombosis) (HCC) 05/19/2007    [facility] CTA: 1. Small nonocclusive pulmonary embolus in a subsegmental branch to the left lower lobe. Larger filling defect in a segmental branch to the right lower lobe, not as low in attenuation as is normally seen with a pulmonary embolus, however is seen in multiple planes and remains concerning for an additional nonocclusive pulmonary embolus.   ??? Fibromyalgia    ??? Gastric ulcer    ??? Gastritis 01/03/2015    [facility] Admission, EGD + H. Pylori/Gastritis   ??? Generalized osteoarthritis of multiple sites    ??? GI bleed 01/01/2015    [facility] Admit 5/2-01/07/2015   ??? H. pylori infection 01/03/2015    [facility] Admission, EGD + H. Pylori/Gastritis   ??? Hemiparesis, right (HCC)    ??? Hyperlipidemia    ??? Hypertension    ??? Irregular sleep-wake rhythm    ???  Knee joint pain    ??? Lumbar spondylosis     Orthopedic Spine Surgeon: Dr. Ree KidaJack L. Henrene HawkingSiegel    ??? Muscle spasm    ??? Normocytic anemia     Chronic Iron Deficiency Anemia   ??? Osteoporosis    ??? Pancreatitis    ??? Peripheral neuropathy    ??? Pulmonary embolism (HCC)     Multiple   ??? Seizures (HCC)    ??? Sensory ataxia    ??? SLE (systemic lupus erythematosus) (HCC)    ??? Stroke [facility](HCC)    ??? Subclinical hypothyroidism    ??? Vitamin D deficiency      Past Surgical History:   Procedure Laterality Date   ??? CARDIAC SURG PROCEDURE UNLIST      Cardiac Cath PCI w/ Stents   ??? HX CHOLECYSTECTOMY     ??? HX GI      Partial Gastrectomy    ??? HX GI  01/03/2015    [facility] EGD by Dr. Smitty CordsBruce D. Waldholtz: S/P B-2 Gastric Resection, Gastritis  in small remnant, Bx of Jejunum r/o Celiac Disease and of Stomach. + Gastritis + H. Pylori   ??? HX HYSTERECTOMY         Social History     Social History     Social History   ??? Marital status: DIVORCED     Spouse name: N/A   ??? Number of children: N/A   ??? Years of education: N/A     Social History Main Topics   ??? Smoking status: Former Smoker   ??? Smokeless tobacco: Never Used   ??? Alcohol use No   ??? Drug use: No   ??? Sexual activity: Not Currently     Partners: Male     Other Topics Concern   ??? None     Social History Narrative       Family History     Family History   Problem Relation Age of Onset   ??? Diabetes Maternal Grandmother        Current Medications     Prior to Admission Medications   Prescriptions Last Dose Informant Patient Reported? Taking?   Cholecalciferol, Vitamin D3, (VITAMIN D) 1,000 unit Cap   Yes No   Sig: Take 50,000 Units by mouth every seven (7) days.   amitriptyline (ELAVIL) 50 mg tablet   No No   Sig: Take 1 Tab by mouth nightly.   apixaban (ELIQUIS) 5 mg tablet   No No   Sig: Take 1 Tab by mouth every twelve (12) hours.   Patient taking differently: Take 2.5 mg by mouth every twelve (12) hours.   ascorbic acid, vitamin C, (VITAMIN C) 250 mg tablet   No No   Sig: Take 1 Tab by mouth two (2) times a day. Take with iron pill   cyclobenzaprine (FLEXERIL) 10 mg tablet   Yes No   Sig: Take 10 mg by mouth three (3) times daily as needed for Muscle Spasm(s).   lamoTRIgine (LAMICTAL) 100 mg tablet   No No   Sig: Take 1 Tab by mouth two (2) times a day.   methocarbamol (ROBAXIN) 750 mg tablet   No No   Sig: Take 2 Tabs by mouth four (4) times daily.   ondansetron hcl (ZOFRAN) 4 mg tablet   No No   Sig: Take 1 Tab by mouth every eight (8) hours as needed for Nausea.   oxyCODONE IR (OXY-IR) 15 mg immediate release tablet   No No  Sig: Take 1 Tab by mouth four (4) times daily. Max Daily Amount: 60 mg.   pantoprazole (PROTONIX) 40 mg tablet   No No   Sig: Take 1 Tab by mouth two (2) times a day.    predniSONE (DELTASONE) 20 mg tablet   No No   Sig: Tale 3 tabs daily for 3 days, then 2 tabs daily for 3 days, then one tab daily for three days by mouth   pregabalin (LYRICA) 25 mg capsule   No No   Sig: Take 1 Cap by mouth two (2) times a day. Max Daily Amount: 50 mg.   simvastatin (ZOCOR) 80 mg tablet   Yes No   Sig: Take 80 mg by mouth nightly.   sucralfate (CARAFATE) 100 mg/mL suspension   Yes No   Sig: Take 1 tsp by mouth two (2) times a day.      Facility-Administered Medications: None     Allergies     Allergies   Allergen Reactions   ??? Tape [Adhesive] Itching     Paper tape      ??? Aspirin Not Reported This Time     Because of hx ulcers per patient   ??? Opana [Oxymorphone] Rash and Itching   ??? Tylenol [Acetaminophen] Itching     Physical Exam   ED Triage Vitals   ED Encounter Vitals Group      BP 02/25/17 0248 160/94      Pulse (Heart Rate) 02/25/17 0248 93      Resp Rate 02/25/17 0248 20      Temp 02/25/17 0248 97.9 ??F (36.6 ??C)      Temp src --       O2 Sat (%) 02/25/17 0248 99 %      Weight 02/25/17 0255 140 lb      Height 02/25/17 0255 5\' 7"      Constitutional: Well developed, well nourished. Crying.   EYES: Conjuctivae clear, lids normal.  Pupils equal, symmetrical, and normally reactive.  ZOX:WRUE mucosa is pink, moist and without lesions.  RESP: Lungs clear bilaterally. No wheezing, rhonchi, crackles, or rales. No respiratory distress, tachypnea, or accessory muscle use.  CV: Heart: Regular, without murmurs, gallops, rubs, or thrills.  CHEST: Symmetric without masses or rash. Moderate tenderness to left lower lateral/posterior ribs, no crepitus. Rest of thorax nontender.  GI: Abdomen soft, nondistended, nontender to palpation.   MSK: Moves all 4 extremities spontaneously.   SKIN: Warm and dry. No rashes.   NEURO: Alert, oriented.     Impression and Management Plan   Patient with left-sided rib pain w/ known left 9th rib fx. I suspect pain  is 2/2 rib fx. She's not very knowledgeable about her medications and I don't think she's taking her pain medications as directed or even at all. Her vitals are stable and her lungs are clear and she does not endorse any cough or upper respiratory symptoms so doubt pneumonia or pneumothorax. I do not think any ancillary studies are necessary. Will treat symptomatically for pain.    Diagnostic Studies   None.     ED Course/Medical Decision Making     Medications   morphine 4 mg/mL injection (not administered)   sodium chloride (NS) flush 5-10 mL (10 mL IntraVENous Given 02/25/17 0317)   ondansetron (ZOFRAN) injection 4 mg (4 mg IntraVENous Given 02/25/17 0317)   morphine injection 6 mg (6 mg IntraVENous Given 02/25/17 0316)     Patient remained stable throughout ED course. She received  IV Morphine and Zofran. Afterward improved. On reexamination she was resting comfortably. Pain improved to 2/10. Patient reported that she was feeling better. Patient felt stable for discharge.    Patient instructed to advance activity as tolerated. Instructed to take Oxycodone and Flexeril as needed for pain. I discussed proper dosing of Oxycodone and Flexeril with the patient and also wrote this information down in her discharge instructions. Patient already has a walk-in appointment scheduled with her PCP today so instructed to follow up as scheduled. Instructed to return to ER immediately if worsens. She is happy and agreeable with plan.     Final Diagnosis       ICD-10-CM ICD-9-CM   1. Closed fracture of one rib of left side, initial encounter S22.32XA 807.01       Disposition   Stable condition.     Ahmed Prima, PA-C  February 25, 2017    The patient was fully discussed with attending Wynelle Bourgeois, MD who agrees with the above assessment and plan.    Nursing notes have been reviewed by the physician/ advanced practice Clinician.    Dragon medical dictation was used for portions of this report. Unintended  grammatical errors may occur.    My signature above authenticates this document and my orders, the final diagnosis (es), discharge prescription (s), and instructions in the Epic record. If you have any questions please contact [phone].  ??

## 2017-02-25 NOTE — ED Triage Notes (Signed)
Pt seen here and discharged here 4 days ago with rib fractures from a fall. Pt here now for pain control

## 2017-02-25 NOTE — Progress Notes (Signed)
short code r :     last admission-- 02/20/2017    disposition of last admission-- [facility]    this visit--Rib pain    discharged to home with PCP follow up

## 2017-02-25 NOTE — ED Notes (Signed)
4:53 AM  02/25/17     Discharge instructions given to [name] [name] (name) with verbalization of understanding. Patient accompanied by family.  Patient discharged with the following prescriptions none. Patient discharged to home (destination).      Stable at this time  [name]

## 2017-04-04 ENCOUNTER — Inpatient Hospital Stay
Admit: 2017-04-04 | Discharge: 2017-04-04 | Disposition: A | Discharge status: Home / Self Care | Payer: PRIVATE HEALTH INSURANCE | Attending: Emergency Medicine

## 2017-04-04 ENCOUNTER — Emergency Department: Admit: 2017-04-04 | Payer: PRIVATE HEALTH INSURANCE | Primary: Internal Medicine

## 2017-04-04 ENCOUNTER — Emergency Department: Payer: PRIVATE HEALTH INSURANCE | Primary: Internal Medicine

## 2017-04-04 DIAGNOSIS — M545 Low back pain: Secondary | ICD-10-CM

## 2017-04-04 LAB — POC CHEM8
BUN: 6 mg/dl — ABNORMAL LOW (ref 7–25)
CALCIUM,IONIZED: 4.5 mg/dL (ref 4.40–5.40)
CO2, TOTAL: 31 mmol/L (ref 21–32)
Chloride: 108 mEq/L — ABNORMAL HIGH (ref 98–107)
Creatinine: 0.7 mg/dl (ref 0.6–1.3)
Glucose: 100 mg/dL (ref 74–106)
HCT: 34 % — ABNORMAL LOW (ref 38–45)
HGB: 11.6 gm/dl — ABNORMAL LOW (ref 12.4–17.2)
Potassium: 6.4 mEq/L — CR (ref 3.5–4.9)
Sodium: 141 mEq/L (ref 136–145)

## 2017-04-04 LAB — METABOLIC PANEL, COMPREHENSIVE
ALT (SGPT): 20 U/L (ref 12–78)
AST (SGOT): 50 U/L — ABNORMAL HIGH (ref 15–37)
Albumin: 3.8 gm/dl (ref 3.4–5.0)
Alk. phosphatase: 103 U/L (ref 45–117)
Anion gap: 3 mmol/L — ABNORMAL LOW (ref 5–15)
BUN: 7 mg/dl (ref 7–25)
Bilirubin, total: 0.3 mg/dl (ref 0.2–1.0)
CO2: 29 mEq/L (ref 21–32)
Calcium: 8.7 mg/dl (ref 8.5–10.1)
Chloride: 111 mEq/L — ABNORMAL HIGH (ref 98–107)
Creatinine: 0.7 mg/dl (ref 0.6–1.3)
GFR est AA: >60
GFR est non-AA: >60
Glucose: 98 mg/dl (ref 74–106)
Potassium: 4.2 mEq/L (ref 3.5–5.1)
Protein, total: 7.3 gm/dl (ref 6.4–8.2)
Sodium: 142 mEq/L (ref 136–145)

## 2017-04-04 LAB — CK: CK: 117 U/L (ref 26–192)

## 2017-04-04 MED ORDER — MORPHINE 4 MG/ML SYRINGE
4 mg/mL | INTRAMUSCULAR | Status: AC
Start: 2017-04-04 — End: 2017-04-04
  Administered 2017-04-04: 15:00:00 via INTRAVENOUS

## 2017-04-04 MED ORDER — SODIUM CHLORIDE 0.9% BOLUS IV
0.9 % | INTRAVENOUS | Status: AC
Start: 2017-04-04 — End: 2017-04-04
  Administered 2017-04-04: 15:00:00 via INTRAVENOUS

## 2017-04-04 MED FILL — MORPHINE 4 MG/ML SYRINGE: 4 mg/mL | INTRAMUSCULAR | Qty: 1

## 2017-04-04 NOTE — ED Triage Notes (Signed)
Pt had a trip and fall yesterday. HX of vertigo, hit lower back and buttocks. C/O severe pain

## 2017-04-04 NOTE — ED Provider Notes (Signed)
[place]  Emergency Department Treatment Report    Patient: [name] [name] Age: 66 y.o. Sex: female    Date of Birth: December 30, 1950 Admit Date: 04/04/2017 PCP: Everitt Amber, MD   MRN: [id number]  CSN: [id number]  Attending:  Lennox Laity, MD   Room: H06/H06 Time Dictated: 10:36 AM APP:  Johnella Moloney       Chief Complaint   Chief Complaint   Patient presents with   ??? Fall   ??? Back Pain   ??? Tailbone Pain     History of Present Illness   66 y.o. female with PMHx of vertigo and peripheral neuropathy presents to the ED via EMS with complaints of 10/10 constant lower back pain and tailbone pain s/p fall yesterday. Patient states that she began having vertigo when she tripped and fell on her buttocks.  Tried to grab a nearby piece of furniture but she fell anyway.  The patient was laying on the floor for approximately 30 minutes before her family helped her get onto her couch which is where she has been since the fall. Patient denies head trauma but notes she is unsure if she had LOC. She also complains of neck pain and a posterior headache that began after her fall. She reports having radiating mild tingling and weakness to bilateral lower extremities. Denies having vision changes, chest pain, and abdominal pain, bowel or bladder dysfunction. 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     Review of Systems   Constitutional: No fever or chills  Eyes: No eye redness or drainage.  ENT: No epistaxis or runny nose.  Respiratory: No cough, dyspnea or wheezing.  Cardiovascular: No chest pain, tightness or palpitations  Gastrointestinal: No vomiting or abdominal pain. Positive for diarrhea.   Genitourinary: No dysuria, incontinence, retention, frequency, or urgency.  Musculoskeletal: Positive for lower back pain, tailbone pain, and neck pain.   Integumentary: No rashes.  Neurological: Positive for posterior headache and mild tingling/weakness of bilateral lower extremities. Unknown LOC.     Denies complaints in all other systems.    Past Medical/Surgical History     Past Medical History:   Diagnosis Date   ??? Acute hypercapnic respiratory failure ([place]) 02/05/2013    W/ Encephalopathy requiring BiPAP, Etiology Uncertain however Possible Narcotic Benzodiazipine Related?    ??? Adjustment disorder with mixed anxiety and depressed mood    ??? Aneurysm of internal carotid artery    ??? Arthritis of knee    ??? Asthma    ??? Autoimmune disease ([place])    ??? CAD (coronary artery disease)    ??? Cervicalgia    ??? Chest pain 03/17/2014    Dobutamine Stress Echo: NORMAL WALL MOTION AND GLOBAL SYSTOLIC FUNCTION AT REST AND AFTER?? STRESS/EXERCISE WITH APPROPRIATE AUGMENTATION OF FUNCTION IN ALL SEGMENTS. NO EVIDENCE OF INDUCIBLE ISCHEMIA AT ADEQUATE HEART RATE WITH DOBUTAMINE STRESS.??            ??? Chronic low back pain 10/22/2014    [facility] MRI Lumbar w/o contrast: Moderate facet arthropathy at L4-L5 and L5-S1. Mild/moderate foraminal stenosis at these levels. Disc bulge with annular tear at L4-L5.    ??? Chronic pain syndrome    ??? Coccydynia    ??? Diabetes mellitus     NIDDM   ??? DVT (deep venous thrombosis) ([place]) 05/19/2007    [facility] CTA: 1. Small nonocclusive pulmonary embolus in a subsegmental branch to the left lower lobe. Larger filling defect in a segmental branch  to the right lower lobe, not as low in attenuation as is normally seen with a pulmonary embolus, however is seen in multiple planes and remains concerning for an additional nonocclusive pulmonary embolus.   ??? Fibromyalgia    ??? Gastric ulcer    ??? Gastritis 01/03/2015    [place] Admission, EGD + H. Pylori/Gastritis   ??? Generalized osteoarthritis of multiple sites    ??? GI bleed 01/01/2015    [place] Admit 5/2-01/07/2015   ??? H. pylori infection 01/03/2015    [place] Admission, EGD + H. Pylori/Gastritis   ??? Hemiparesis, right ([place])    ??? Hyperlipidemia    ??? Hypertension    ??? Irregular sleep-wake rhythm    ??? Knee joint pain    ??? Lumbar spondylosis      Orthopedic Spine Surgeon: Dr. Barnabas Lister L. Sonny Masters    ??? Muscle spasm    ??? Normocytic anemia     Chronic Iron Deficiency Anemia   ??? Osteoporosis    ??? Pancreatitis    ??? Peripheral neuropathy    ??? Pulmonary embolism (HCC)     Multiple   ??? Seizures ([place])    ??? Sensory ataxia    ??? SLE (systemic lupus erythematosus) ([place])    ??? Stroke [facility])    ??? Subclinical hypothyroidism    ??? Vertigo    ??? Vitamin D deficiency      Past Surgical History:   Procedure Laterality Date   ??? CARDIAC SURG PROCEDURE UNLIST      Cardiac Cath PCI w/ Stents   ??? HX CHOLECYSTECTOMY     ??? HX GI      Partial Gastrectomy    ??? HX GI  01/03/2015    [facility] EGD by Dr. Darnell Level D. [place]dholtz: S/P B-2 Gastric Resection, Gastritis in small remnant, Bx of Jejunum r/o Celiac Disease and of Stomach. + Gastritis + H. Pylori   ??? HX HYSTERECTOMY         Social History     Social History     Social History   ??? Marital status: DIVORCED     Spouse name: N/A   ??? Number of children: N/A   ??? Years of education: N/A     Social History Main Topics   ??? Smoking status: Former Smoker   ??? Smokeless tobacco: Never Used   ??? Alcohol use No   ??? Drug use: No   ??? Sexual activity: Not Currently     Partners: Male     Other Topics Concern   ??? Not on file     Social History Narrative       Family History     Family History   Problem Relation Age of Onset   ??? Diabetes Maternal Grandmother        Home Medications     Prior to Admission Medications   Prescriptions Last Dose Informant Patient Reported? Taking?   Cholecalciferol, Vitamin D3, (VITAMIN D) 1,000 unit Cap   Yes No   Sig: Take 50,000 Units by mouth every seven (7) days.   amitriptyline (ELAVIL) 50 mg tablet   No No   Sig: Take 1 Tab by mouth nightly.   apixaban (ELIQUIS) 5 mg tablet   No No   Sig: Take 1 Tab by mouth every twelve (12) hours.   Patient taking differently: Take 2.5 mg by mouth every twelve (12) hours.   ascorbic acid, vitamin C, (VITAMIN C) 250 mg tablet   No No    Sig: Take  1 Tab by mouth two (2) times a day. Take with iron pill   cyclobenzaprine (FLEXERIL) 10 mg tablet   Yes No   Sig: Take 10 mg by mouth three (3) times daily as needed for Muscle Spasm(s).   lamoTRIgine (LAMICTAL) 100 mg tablet   No No   Sig: Take 1 Tab by mouth two (2) times a day.   methocarbamol (ROBAXIN) 750 mg tablet   No No   Sig: Take 2 Tabs by mouth four (4) times daily.   ondansetron hcl (ZOFRAN) 4 mg tablet   No No   Sig: Take 1 Tab by mouth every eight (8) hours as needed for Nausea.   oxyCODONE IR (OXY-IR) 15 mg immediate release tablet   No No   Sig: Take 1 Tab by mouth four (4) times daily. Max Daily Amount: 60 mg.   pantoprazole (PROTONIX) 40 mg tablet   No No   Sig: Take 1 Tab by mouth two (2) times a day.   predniSONE (DELTASONE) 20 mg tablet   No No   Sig: Tale 3 tabs daily for 3 days, then 2 tabs daily for 3 days, then one tab daily for three days by mouth   pregabalin (LYRICA) 25 mg capsule   No No   Sig: Take 1 Cap by mouth two (2) times a day. Max Daily Amount: 50 mg.   simvastatin (ZOCOR) 80 mg tablet   Yes No   Sig: Take 80 mg by mouth nightly.   sucralfate (CARAFATE) 100 mg/mL suspension   Yes No   Sig: Take 1 tsp by mouth two (2) times a day.      Facility-Administered Medications: None       Allergies     Allergies   Allergen Reactions   ??? Tape [Adhesive] Itching     Paper tape      ??? Aspirin Not Reported This Time     Because of hx ulcers per patient   ??? Opana [Oxymorphone] Rash and Itching   ??? Tylenol [Acetaminophen] Itching       Physical Exam     ED Triage Vitals   ED Encounter Vitals Group      BP 04/04/17 1044 134/87      Pulse (Heart Rate) 04/04/17 1044 88      Resp Rate 04/04/17 1044 20      Temp 04/04/17 1044 98.4 ??F (36.9 ??C)      Temp src --       O2 Sat (%) 04/04/17 1044 100 %      Weight 04/04/17 1038 143 lb      Height 04/04/17 1038 '5\' 3"'$      Constitutional: Patient appears well developed and well nourished.  Lying on exam table crying and writhing around.     HEENT: Conjunctiva clear.  PERRLA. Mucous membranes moist, non-erythematous. Surface of the pharynx, palate, and tongue are pink, moist and without lesions.  Tenderness palpation of the occiput but no bony step-offs or lumps palpated.  Neck: Symmetrical, no masses. Normal range of motion.  Respiratory: Lungs clear to auscultation, nonlabored respirations. No tachypnea or accessory muscle use.  Cardiovascular: Heart regular rate and rhythm without murmur rubs or gallops. Calves soft and non-tender.  No peripheral edema or significant variscosities.    Gastrointestinal:  Abdomen soft, nontender without complaint of pain to palpation.  Musculoskeletal: No deformities of the limbs.  Patient with tenderness along the C-spine without bony step-offs palpated and tenderness along the cervical paraspinal  musculature.  No thoracic spine tenderness and no bony step-offs palpated.  Exquisite tenderness to the lightest touch of the lumbosacral spine and down in the gluteal cleft.  I see no bruising on the back, buttocks or within the gluteal cleft.  No pain to palpation over the bilateral greater trochanters.  Integumentary: Warm and dry without rashes or lesions  Neurologic: Alert and moving all limbs spontaneously.  Patient with sensation that intact and equal in the bilateral lower extremities and 5 out of 5 strength in dorsiflexion and plantar flexion of the ankles against resistance.    Impression and Management Plan   66 year old female who had a fall due to episode of vertigo.  Vertigo is chronic for her.  Unsure whether not she hit her head but she does have posterior head pain, neck pain.  Has exquisitely tender low back and area of the gluteal cleft.  Patient's on Eliquis.  This happened last night.  I would expect some bruising on the buttocks or the back given the fall and the fact that she's on Eliquis.  She has pain to the lightest touch of these areas.  Patient is lying on the bed crying and writhing around.   Given this history, I feel we have to CT patient's head and neck as well as consider CT abdomen and pelvis with IV contrast to evaluate for retroperitoneal hemorrhage.  We'll be able to get a few of L-spine and sacrum.  We'll give morphine and check basic labs.  Diagnostic Studies       Lab:   Recent Results (from the past 24 hour(s))   CK    Collection Time: 04/04/17 11:56 AM   Result Value Ref Range    CK 117 26 - 355 U/L   METABOLIC PANEL, COMPREHENSIVE    Collection Time: 04/04/17 11:56 AM   Result Value Ref Range    Sodium 142 136 - 145 mEq/L    Potassium 4.2 3.5 - 5.1 mEq/L    Chloride 111 (H) 98 - 107 mEq/L    CO2 29 21 - 32 mEq/L    Glucose 98 74 - 106 mg/dl    BUN 7 7 - 25 mg/dl    Creatinine 0.7 0.6 - 1.3 mg/dl    GFR est AA >60.0      GFR est non-AA >60      Calcium 8.7 8.5 - 10.1 mg/dl    AST (SGOT) 50 (H) 15 - 37 U/L    ALT (SGPT) 20 12 - 78 U/L    Alk. phosphatase 103 45 - 117 U/L    Bilirubin, total 0.3 0.2 - 1.0 mg/dl    Protein, total 7.3 6.4 - 8.2 gm/dl    Albumin 3.8 3.4 - 5.0 gm/dl    Anion gap 3 (L) 5 - 15 mmol/L   POC CHEM8    Collection Time: 04/04/17 12:34 PM   Result Value Ref Range    Sodium 141 136 - 145 mEq/L    Potassium 6.4 (HH) 3.5 - 4.9 mEq/L    Chloride 108 (H) 98 - 107 mEq/L    CO2, TOTAL 31 21 - 32 mmol/L    Glucose 100 74 - 106 mg/dL    BUN 6 (L) 7 - 25 mg/dl    Creatinine 0.7 0.6 - 1.3 mg/dl    HCT 34 (L) 38 - 45 %    HGB 11.6 (L) 12.4 - 17.2 gm/dl    CALCIUM,IONIZED 4.50 4.40 - 5.40 mg/dL  Imaging:    Ct Head Wo Cont    Result Date: 04/04/2017  INDICATION: Head trauma, closed, mild, GCS >= 13, no risk factors, neuro exam normal.    COMPARISON: 02/19/2017 TECHNIQUE: The CT scan of the head was performed without contrast. Coronal and sagittal reconstructions were obtained. FINDINGS: The ventricular system shows normal dimensions and morphology. The white and gray matter is well differentiated. Mild unchanged white matter hypoattenuation. There is no intracranial bleed or  evidence of an acute infarct.  No identification of a fracture.  Normal sinuses and mastoids.        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 No acute findings.        Ct Spine Cerv Wo Cont    Result Date: 04/04/2017  INDICATION: Recent trauma, spine    EXAMINATION: CT scan of the cervical spine. Coronal and sagittal reconstruction imaging have been obtained. COMPARISON: 02/18/2017 FINDINGS: Vertebral bodies demonstrate normal height and alignment. Grade 1 anterolisthesis at C5-C6. No fracture of the cervical spine. Atlantoaxial joint and odontoid process are within normal limits. Mild degeneration of the cervical spine.     IMPRESSION: No fracture of the cervical spine. Grade 1 anterolisthesis at C5-C6 and mild degeneration.     Ct Abd Pelv W Cont    Result Date: 04/04/2017  EXAM: CT ABD PELV W CONT INDICATION: Pain, pelvis    TECHNIQUE: Axial CT scan of the abdomen and pelvis with   IV contrast    including coronal and sagittal reconstructions. COMPARISON: 02/18/2017 FINDINGS: Lower thorax: Mild right lower lobe fibrosis. ABDOMEN: Liver: Unremarkable. No mass. Gallbladder and bile ducts: Cholecystectomy. Chronically dilated bile ducts. Pancreas: Unremarkable. No ductal dilatation. No mass. Spleen: Unremarkable. No splenomegaly. Adrenals: Unremarkable. No mass. Kidneys and ureters: Unremarkable. No hydronephrosis. No solid mass. Appendix: No findings to suggest appendicitis. Stomach and bowel: Gastric bypass procedure. Rectosigmoid dilatation and constipation. Peritoneum: Unremarkable. No significant fluid collection. No free air. Lymph nodes: Unremarkable. No enlarged lymph nodes. Vasculature: Unremarkable. No aortic aneurysm. Bones: No acute fracture. Abdominal wall: Unremarkable. No evidence of a hernia. PELVIS: Bladder: Distended urinary bladder. Reproductive: Hysterectomy.     IMPRESSION: 1. Chronically markedly dilated bile ducts. Cholecystectomy.  2. Gastric bypass procedure. 3. Constipation. 4. Distended urinary bladder. 5. Hysterectomy.     ED Course     ED Course   Comment By Time   Patient asleep on exam table, one hour after given 4 mg of morphine.  Currently waiting for blood test results and CTs. Owens Loffler, PA-C 08/04 1229       Patient received the following medications during stay:  Medications   morphine injection 4 mg (4 mg IntraVENous Given 04/04/17 1129)   sodium chloride 0.9 % bolus infusion 1,000 mL (1,000 mL IntraVENous New Bag 04/04/17 1129)       Most recent vital signs:  Visit Vitals   ??? BP 134/87   ??? Pulse 88   ??? Temp 98.4 ??F (36.9 ??C)   ??? Resp 20   ??? Ht '5\' 3"'$  (1.6 m)   ??? Wt 64.9 kg (143 lb)   ??? SpO2 100%   ??? BMI 25.33 kg/m2       Patient was seen getting in and out of wheelchair to get the bathroom without difficulty.  Medical Decision Making     Approximately a CPK level was admitted to basic labs, all labs were normal.  CT of the abdomen and pelvis showed no acute bony abnormality but did show distended  bladder.  Given patient's history of low back problems, postvoid bladder scan was done, 93 ML's of urine was the residual.  Time workup unremarkable.  Patient safe for discharge.  I have reviewed patient's [place] PMP.  Patient gets regular oxycodone 15 mg tabs prescribed every month by Dr. Leron Croak, pain management.  Patient filled a prescription for 120 oxycodone on March 08, 2017.  I do not feel comfortable writing prescription opioid medication for patient.  She is follow-up with her primary care physician and pain management specialist.  She should still have some oxycodone at home.  Patient should however return to the ED for any new or worsening symptoms.    Final Diagnosis     1. Acute midline low back pain without sciatica    2. Coccydynia    3. Fall, initial encounter        Disposition   The patient was discharged home in stable condition will follow-up as outlined above.    Scribe Attestation      Maggie P Verlene Mayer acting as a Education administrator for and in the presence of Owens Loffler, PA-C      April 04, 2017 at 10:36 AM       Provider Attestation:      I personally performed the services described in the documentation, reviewed the documentation, as recorded by the scribe in my presence, and it accurately and completely records my words and actions. April 04, 2017 at 10:36 AM - Owens Loffler, PA-C    The patient was personally evaluated by myself and discussed with Lennox Laity, MD who agrees with the above assessment and plan.      Owens Loffler, PA-C  April 04, 2017    My signature above authenticates this document and my orders, the final ??  diagnosis (es), discharge prescription (s), and instructions in the Epic ??  record.  If you have any questions please contact [phone].  ??  Nursing notes have been reviewed by the physician/ advanced practice ??  Clinician.

## 2017-04-04 NOTE — ED Notes (Signed)
4:21 PM  04/04/17     Discharge instructions given to patient (name) with verbalization of understanding. Patient accompanied by granddaughter.  Patient discharged with the following prescriptions none. Patient discharged to home via medicaid cab (destination).      Joelene MillinKimberly M Bastemeyer

## 2017-04-04 NOTE — ED Notes (Signed)
Pt taken to the bathroom by RN Damweber. Pt states she missed the cup when voiding. No urine sample was obtained.

## 2017-04-10 MED ORDER — IOPAMIDOL 76 % IV SOLN
370 mg iodine /mL (76 %) | Freq: Once | INTRAVENOUS | Status: AC
Start: 2017-04-10 — End: 2017-04-04
  Administered 2017-04-04: 18:00:00 via INTRAVENOUS

## 2017-05-13 MED FILL — ONDANSETRON (PF) 4 MG/2 ML INJECTION: 4 mg/2 mL | INTRAMUSCULAR | Qty: 2

## 2017-05-13 MED FILL — MORPHINE 10 MG/ML INJ SOLUTION: 10 mg/ml | INTRAMUSCULAR | Qty: 5

## 2017-06-19 ENCOUNTER — Inpatient Hospital Stay
Admit: 2017-06-19 | Discharge: 2017-06-19 | Disposition: A | Discharge status: Home / Self Care | Payer: PRIVATE HEALTH INSURANCE | Attending: Emergency Medicine

## 2017-06-19 ENCOUNTER — Emergency Department: Admit: 2017-06-19 | Payer: PRIVATE HEALTH INSURANCE | Primary: Internal Medicine

## 2017-06-19 ENCOUNTER — Emergency Department

## 2017-06-19 DIAGNOSIS — M25521 Pain in right elbow: Secondary | ICD-10-CM

## 2017-06-19 MED ORDER — OXYCODONE 5 MG TAB
5 mg | ORAL | Status: AC
Start: 2017-06-19 — End: 2017-06-19
  Administered 2017-06-19: 17:00:00 via ORAL

## 2017-06-19 MED FILL — OXYCODONE 5 MG TAB: 5 mg | ORAL | Qty: 1

## 2017-06-19 NOTE — ED Provider Notes (Signed)
[facility]  Emergency Department Treatment Report    Patient: [name] [name] Age: 66 y.o. Sex: female    Date of Birth: November 07, 1950 Admit Date: 06/19/2017 PCP: Liliane Channel, MD   MRN: [id number]  CSN: [id number]  ATTENDING: Randell Patient, MD   Room: ER09/ER09 Time Dictated: 12:03 PM APP: Merian Capron, PA-C     Chief Complaint   Chief Complaint   Patient presents with   ??? Arm swelling       History of Present Illness   66 y.o. female with a history of CVA, DVT, PE on eliquis presenting for right arm pain since yesterday that is progressive and associated with some swelling. She cannot recall any inciting injury. Her pain is sharp, progressive, radiating from the shoulder down to the mid forearm, and is worse with movement.  She has no other complaints today, denies chest pain or shortness of breath. Reports that she is seen by pain management, has a prescription for oxycodone at home, took one this morning approximately 0800 without any relief.     Review of Systems   Constitutional:  No fever, chills, body aches  ENT: No sore throat or runny nose, changes in vision or hearing.  Respiratory: No cough, shortness of breath, or wheezing  Cardiovascular: No chest pain or palpitations  Gastrointestinal: No abdominal pain, nausea, vomiting, diarrhea, or constipation  Genitourinary: No dysuria or frequency.  Musculoskeletal: +right arm pain.  Integumentary: No rashes or lesions  Hematologic: No bleeding/bruising complaints  Neurological: No headache or dizziness.    Past Medical/Surgical History     Past Medical History:   Diagnosis Date   ??? Acute hypercapnic respiratory failure (HCC) 02/05/2013    W/ Encephalopathy requiring BiPAP, Etiology Uncertain however Possible Narcotic Benzodiazipine Related?    ??? Adjustment disorder with mixed anxiety and depressed mood    ??? Aneurysm of internal carotid artery    ??? Arthritis of knee    ??? Asthma    ??? Autoimmune disease (HCC)     ??? CAD (coronary artery disease)    ??? Cervicalgia    ??? Chest pain 03/17/2014    Dobutamine Stress Echo: NORMAL WALL MOTION AND GLOBAL SYSTOLIC FUNCTION AT REST AND AFTER?? STRESS/EXERCISE WITH APPROPRIATE AUGMENTATION OF FUNCTION IN ALL SEGMENTS. NO EVIDENCE OF INDUCIBLE ISCHEMIA AT ADEQUATE HEART RATE WITH DOBUTAMINE STRESS.??            ??? Chronic low back pain 10/22/2014    [facility] MRI Lumbar w/o contrast: Moderate facet arthropathy at L4-L5 and L5-S1. Mild/moderate foraminal stenosis at these levels. Disc bulge with annular tear at L4-L5.    ??? Chronic pain syndrome    ??? Coccydynia    ??? Diabetes mellitus     NIDDM   ??? DVT (deep venous thrombosis) (HCC) 05/19/2007    [facility] CTA: 1. Small nonocclusive pulmonary embolus in a subsegmental branch to the left lower lobe. Larger filling defect in a segmental branch to the right lower lobe, not as low in attenuation as is normally seen with a pulmonary embolus, however is seen in multiple planes and remains concerning for an additional nonocclusive pulmonary embolus.   ??? Fibromyalgia    ??? Gastric ulcer    ??? Gastritis 01/03/2015    [facility] Admission, EGD + H. Pylori/Gastritis   ??? Generalized osteoarthritis of multiple sites    ??? GI bleed 01/01/2015    [facility] Admit 5/2-01/07/2015   ??? H. pylori infection 01/03/2015    [facility] Admission, EGD +  H. Pylori/Gastritis   ??? Hemiparesis, right (HCC)    ??? Hyperlipidemia    ??? Hypertension    ??? Irregular sleep-wake rhythm    ??? Knee joint pain    ??? Lumbar spondylosis     Orthopedic Spine Surgeon: Dr. Ree Kida L. Henrene Hawking    ??? Muscle spasm    ??? Normocytic anemia     Chronic Iron Deficiency Anemia   ??? Osteoporosis    ??? Pancreatitis    ??? Peripheral neuropathy    ??? Pulmonary embolism (HCC)     Multiple   ??? Seizures (HCC)    ??? Sensory ataxia    ??? SLE (systemic lupus erythematosus) (HCC)    ??? Stroke [facility])    ??? Subclinical hypothyroidism    ??? Vertigo    ??? Vitamin D deficiency      Past Surgical History:   Procedure Laterality Date   ??? CARDIAC SURG PROCEDURE UNLIST       Cardiac Cath PCI w/ Stents   ??? HX CHOLECYSTECTOMY     ??? HX GI      Partial Gastrectomy    ??? HX GI  01/03/2015    [facility] EGD by Dr. Smitty Cords D. Waldholtz: S/P B-2 Gastric Resection, Gastritis in small remnant, Bx of Jejunum r/o Celiac Disease and of Stomach. + Gastritis + H. Pylori   ??? HX HYSTERECTOMY       Social History     Social History     Socioeconomic History   ??? Marital status: DIVORCED     Spouse name: Not on file   ??? Number of children: Not on file   ??? Years of education: Not on file   ??? Highest education level: Not on file   Social Needs   ??? Financial resource strain: Not on file   ??? Food insecurity - worry: Not on file   ??? Food insecurity - inability: Not on file   ??? Transportation needs - medical: Not on file   ??? Transportation needs - non-medical: Not on file   Occupational History   ??? Not on file   Tobacco Use   ??? Smoking status: Former Smoker   ??? Smokeless tobacco: Never Used   Substance and Sexual Activity   ??? Alcohol use: No   ??? Drug use: No   ??? Sexual activity: Not Currently     Partners: Male   Other Topics Concern   ??? Not on file   Social History Narrative   ??? Not on file     Family History     Family History   Problem Relation Age of Onset   ??? Diabetes Maternal Grandmother      Current Medications     Prior to Admission Medications   Prescriptions Last Dose Informant Patient Reported? Taking?   Cholecalciferol, Vitamin D3, (VITAMIN D) 1,000 unit Cap   Yes No   Sig: Take 50,000 Units by mouth every seven (7) days.   amitriptyline (ELAVIL) 50 mg tablet   No No   Sig: Take 1 Tab by mouth nightly.   apixaban (ELIQUIS) 5 mg tablet   No No   Sig: Take 1 Tab by mouth every twelve (12) hours.   Patient taking differently: Take 2.5 mg by mouth every twelve (12) hours.   ascorbic acid, vitamin C, (VITAMIN C) 250 mg tablet   No No   Sig: Take 1 Tab by mouth two (2) times a day. Take with iron pill   cyclobenzaprine (FLEXERIL) 10  mg tablet   Yes No    Sig: Take 10 mg by mouth three (3) times daily as needed for Muscle Spasm(s).   lamoTRIgine (LAMICTAL) 100 mg tablet   No No   Sig: Take 1 Tab by mouth two (2) times a day.   methocarbamol (ROBAXIN) 750 mg tablet   No No   Sig: Take 2 Tabs by mouth four (4) times daily.   ondansetron hcl (ZOFRAN) 4 mg tablet   No No   Sig: Take 1 Tab by mouth every eight (8) hours as needed for Nausea.   oxyCODONE IR (OXY-IR) 15 mg immediate release tablet   No No   Sig: Take 1 Tab by mouth four (4) times daily. Max Daily Amount: 60 mg.   pantoprazole (PROTONIX) 40 mg tablet   No No   Sig: Take 1 Tab by mouth two (2) times a day.   predniSONE (DELTASONE) 20 mg tablet   No No   Sig: Tale 3 tabs daily for 3 days, then 2 tabs daily for 3 days, then one tab daily for three days by mouth   pregabalin (LYRICA) 25 mg capsule   No No   Sig: Take 1 Cap by mouth two (2) times a day. Max Daily Amount: 50 mg.   simvastatin (ZOCOR) 80 mg tablet   Yes No   Sig: Take 80 mg by mouth nightly.   sucralfate (CARAFATE) 100 mg/mL suspension   Yes No   Sig: Take 1 tsp by mouth two (2) times a day.      Facility-Administered Medications: None       Allergies     Allergies   Allergen Reactions   ??? Tape [Adhesive] Itching     Paper tape      ??? Aspirin Not Reported This Time     Because of hx ulcers per patient   ??? Opana [Oxymorphone] Rash and Itching   ??? Tylenol [Acetaminophen] Itching       Physical Exam     ED Triage Vitals   ED Encounter Vitals Group      BP 06/19/17 1038 (!) 130/97      Pulse (Heart Rate) 06/19/17 1038 89      Resp Rate 06/19/17 1038 18      Temp 06/19/17 1038 98 ??F (36.7 ??C)      Temp src --       O2 Sat (%) 06/19/17 1038 100 %      Weight 06/19/17 1030 148 lb      Height 06/19/17 1030 5\' 7"      Constitutional: Patient appears well developed and well nourished. Appearance and behavior are age and situation appropriate.  HEENT: Conjunctiva clear.  PERRL. Mucous membranes moist,  non-erythematous. Surface of the pharynx, palate, and tongue are pink, moist and without lesions.  Neck: supple, non tender, symmetrical, no masses, JVD, or lymphadenopathy   Respiratory: lungs clear to auscultation, nonlabored respirations. No tachypnea or accessory muscle use.  Cardiovascular: heart regular rate and rhythm without murmur rubs or gallops.     Gastrointestinal:  Non-tender, non-distended, normoactive bowel sounds  Musculoskeletal: Right upper extremity without obvious swelling, diffusely tender with palpation over the humerus extending into the shoulder joint. Limited range of motion secondary to pain.  Pulses:  Distal pulses 2+ and equal bilaterally.   Integumentary: warm and dry, no jaundice, no rashes or lesions  Neurologic: alert and oriented, there is minimally decreased grip strength in the right hand, patient reports this is  residual from a stroke she suffered in the past.     Impression and Management Plan   66 year old female with a history of DVT and PE presenting with onset of right upper extremity pain without inciting injury 2 days ago.  On exam, she has normal vital signs, lungs are clear to auscultation bilaterally, 100% O2 saturation on room air.  The right upper extremity has no obvious swelling but is diffusely tender with palpation of the humerus extending into the shoulder joint down to the mid forearm with limited range of motion secondary to pain.  She has good distal pulses and brisk capillary refill.  There is minimally diminished grip strength in the right hand but the patient reports that this is residual from her stroke.  We'll obtain PVL of the right upper extremity and plain films of the humerus and elbow to rule out fracture.    Diagnostic Studies   Lab:   No results found for this or any previous visit (from the past 12 hour(s)).  Imaging:    Xr Humerus Rt    Result Date: 06/19/2017  Clinical history: Pain, injury EXAMINATION: 2 views right humerus  FINDINGS: Soft tissues are within normal limits. No fracture of the humerus.     IMPRESSION: No fracture right humerus.     Xr Elbow Rt Min 3 V    Result Date: 06/19/2017  Clinical history: Right elbow pain, injury EXAMINATION: 3 views right elbow FINDINGS: Posterior fat pad is not seen. No fracture of the distal humerus, proximal radius or ulna.     IMPRESSION: No fracture right elbow.     Duplex Upper Ext Venous Right    Result Date: 06/19/2017                                                               Study ID: [id number]                                                   [facility]                                                   [address] [place], [place] [address]                     Upper Extremity Venous Duplex Report Name: LEEBA, BARBE Date: 10          /19/2018 11:32 AM MRN: [id number]                        Patient Location: 2PCU^PC01^PC01 DOB: 17-Mar-1951  Age: 25 yrs Gender: Female                     Account #: [id number] Reason For Study: pain Ordering Physician: Vonna Drafts, Keondria Siever Performed By: Tod Persia Interpretation Summary Normal venous duplex examination of the right upper extremity. Spectral Doppler exam demonstrates normal venous flow in the contralateral internal jugular and proximal subclavian veins. _____________________________________________________________________________ Johnell Comings Limited uniliateral duplex performed of the right arm. ICD 10: M79.601. HISTORY/SYMPTOMS Cardiac stents. Cancer. Pulmonary embolism. Syncope. Two weeks ago trauma: fall x2. Chest pain. Arm pain x2 days. RIGHT ARM Spectral Doppler exam demonstrates normal venous flow and complete compression of the right jugular, subclavain, axillary and brachial veins. RIGHT SUPERFICIAL VEINS Spectral Doppler exam demonstrates normal venous flow and complete compression of the basilic and cephalic  veins of the upper arm. LEFT ARM Spectral Doppler exam demonstrates normal venous flow in the contralateral internal jugular and proximal subclavian veins.                         DR Aldona Bar, MD   06/19/2017 04:02 Electronically signed byPM         ED Course/Medical Decision Making   patient's PVL is negative for thrombus.  Her x-rays are negative for fracture.  There is no obvious inciting injury, no obvious bony defect, any extremity is neurovascularly intact.  Patient was very relieved when I discussed these findings with her.  I offered her a sling for comfort, we were able to medicate the patient at the 4 hour mark after her last dose of oxycodone.  Recommended to her to take her regularly scheduled pain medication as prescribed by pain management specialist.  She was instructed to follow up with her primary care doctor or return to the emergency department with any new or worsening symptoms including chest pain or shortness of breath.     Final Diagnosis       ICD-10-CM ICD-9-CM   1. Pain of right upper extremity M79.601 729.5       Disposition   Discharged home.     Izek Corvino, PA-C  June 19, 2017    The patient was personally evaluated by myself and Dr. Thana Ates who agrees with the above assessment and plan.    My signature above authenticates this document and my orders, the final diagnosis (es), discharge prescription (s), and instructions in the Epic record. If you have any questions please contact [phone].  ??  Nursing notes have been reviewed by the physician/ advanced practice clinician.

## 2017-06-19 NOTE — ED Triage Notes (Signed)
Patient complains of right arm swelling and pain that started yesterday, she cannot recall any injury to her arm.

## 2017-06-19 NOTE — ED Notes (Signed)
1:10 PM  06/19/17     Discharge instructions given to patient (name) with verbalization of understanding. Patient accompanied by daughter.  Patient discharged with the following prescriptions (none).  Patient discharged to home and taken off unit via w/c without incident   Teofilo PodMegan Kinlaw, RN

## 2017-06-19 NOTE — Progress Notes (Signed)
PVL Upper Extremity Venous Preliminary Results    Right:  No thrombus identified in the vessels imaged.    Left:  No thrombus noted in the IJV and proximal SCV.    Final MD report to follow  Exam Performed ZO:XWRUby:Cara Dzendzel, RVT, RVS

## 2017-10-01 ENCOUNTER — Emergency Department: Admit: 2017-10-01 | Payer: PRIVATE HEALTH INSURANCE | Primary: Internal Medicine

## 2017-10-01 ENCOUNTER — Inpatient Hospital Stay
Admit: 2017-10-01 | Discharge: 2017-10-01 | Disposition: A | Discharge status: Home / Self Care | Payer: PRIVATE HEALTH INSURANCE | Attending: Emergency Medicine

## 2017-10-01 DIAGNOSIS — S2242XA Multiple fractures of ribs, left side, initial encounter for closed fracture: Secondary | ICD-10-CM

## 2017-10-01 MED ORDER — OXYCODONE-ACETAMINOPHEN 5 MG-325 MG TAB
5-325 mg | ORAL | Status: AC
Start: 2017-10-01 — End: 2017-10-01
  Administered 2017-10-01: 19:00:00 via ORAL

## 2017-10-01 MED FILL — OXYCODONE-ACETAMINOPHEN 5 MG-325 MG TAB: 5-325 mg | ORAL | Qty: 2

## 2017-10-01 NOTE — ED Triage Notes (Signed)
Pt arrived via ems c/o left rib injury w/ possible fracture from slipping in shower last night and landing on left side.  No loc/syncope or other sx's of distress at this time

## 2017-10-01 NOTE — ED Provider Notes (Signed)
[facility]  Emergency Department Treatment Report    Patient: [name] [name] Age: 67 y.o. Sex: female    Date of Birth: Oct 12, 1950 Admit Date: 10/01/2017 PCP: Coralie Carpen, MD   MRN: [id number]  CSN: [id number]     Room: ER38/ER38 Time Dictated: 2:23 PM          Chief Complaint   Chief Complaint   Patient presents with   ??? Rib Injury       History of Present Illness   67 y.o. female slipped and fell last night, striking her left ribs, presents with complaints of left anterior and posterior rib pain as well as upper back pain, denies abdominal discomfort no head trauma or loss of consciousness, no new neck pain.    Review of Systems   Constitutional: No fever, chills, or weight loss  Eyes: No visual symptoms.    Respiratory: Pain with respirations  Cardiovascular: No [place] chest pain, pressure, palpitations, tightness or heaviness.  Gastrointestinal: No abdominal pain.  Genitourinary: No blood in urine  Musculoskeletal: Left rib pain  Integumentary: No abrasions or lacerations  Neurological: No headaches, head trauma or loss of consciousness  Denies complaints in all other systems.    Past Medical/Surgical History     Past Medical History:   Diagnosis Date   ??? Acute hypercapnic respiratory failure (HCC) 02/05/2013    W/ Encephalopathy requiring BiPAP, Etiology Uncertain however Possible Narcotic Benzodiazipine Related?    ??? Adjustment disorder with mixed anxiety and depressed mood    ??? Aneurysm of internal carotid artery    ??? Arthritis of knee    ??? Asthma    ??? Autoimmune disease (HCC)    ??? CAD (coronary artery disease)    ??? Cervicalgia    ??? Chest pain 03/17/2014    Dobutamine Stress Echo: NORMAL WALL MOTION AND GLOBAL SYSTOLIC FUNCTION AT REST AND AFTER?? STRESS/EXERCISE WITH APPROPRIATE AUGMENTATION OF FUNCTION IN ALL SEGMENTS. NO EVIDENCE OF INDUCIBLE ISCHEMIA AT ADEQUATE HEART RATE WITH DOBUTAMINE STRESS.??            ??? Chronic low back pain 10/22/2014     [facility] MRI Lumbar w/o contrast: Moderate facet arthropathy at L4-L5 and L5-S1. Mild/moderate foraminal stenosis at these levels. Disc bulge with annular tear at L4-L5.    ??? Chronic pain syndrome    ??? Coccydynia    ??? Diabetes mellitus     NIDDM   ??? DVT (deep venous thrombosis) (HCC) 05/19/2007    [facility] CTA: 1. Small nonocclusive pulmonary embolus in a subsegmental branch to the left lower lobe. Larger filling defect in a segmental branch to the right lower lobe, not as low in attenuation as is normally seen with a pulmonary embolus, however is seen in multiple planes and remains concerning for an additional nonocclusive pulmonary embolus.   ??? Fibromyalgia    ??? Gastric ulcer    ??? Gastritis 01/03/2015    [facility] Admission, EGD + H. Pylori/Gastritis   ??? Generalized osteoarthritis of multiple sites    ??? GI bleed 01/01/2015    [facility] Admit 5/2-01/07/2015   ??? H. pylori infection 01/03/2015    [facility] Admission, EGD + H. Pylori/Gastritis   ??? Hemiparesis, right (HCC)    ??? Hyperlipidemia    ??? Hypertension    ??? Irregular sleep-wake rhythm    ??? Knee joint pain    ??? Lumbar spondylosis     Orthopedic Spine Surgeon: Dr. Ree Kida L. Henrene Hawking    ??? Muscle spasm    ???  Normocytic anemia     Chronic Iron Deficiency Anemia   ??? Osteoporosis    ??? Pancreatitis    ??? Peripheral neuropathy    ??? Pulmonary embolism (HCC)     Multiple   ??? Seizures (HCC)    ??? Sensory ataxia    ??? SLE (systemic lupus erythematosus) (HCC)    ??? Stroke [facility])    ??? Subclinical hypothyroidism    ??? Vertigo    ??? Vitamin D deficiency      Past Surgical History:   Procedure Laterality Date   ??? CARDIAC SURG PROCEDURE UNLIST      Cardiac Cath PCI w/ Stents   ??? HX CHOLECYSTECTOMY     ??? HX GI      Partial Gastrectomy    ??? HX GI  01/03/2015    [facility] EGD by Dr. Smitty Cords D. Waldholtz: S/P B-2 Gastric Resection, Gastritis in small remnant, Bx of Jejunum r/o Celiac Disease and of Stomach. + Gastritis + H. Pylori   ??? HX HYSTERECTOMY         Social History     Social History     Socioeconomic History    ??? Marital status: DIVORCED     Spouse name: Not on file   ??? Number of children: Not on file   ??? Years of education: Not on file   ??? Highest education level: Not on file   Tobacco Use   ??? Smoking status: Former Smoker   ??? Smokeless tobacco: Never Used   Substance and Sexual Activity   ??? Alcohol use: No   ??? Drug use: No   ??? Sexual activity: Not Currently     Partners: Male       Family History     Family History   Problem Relation Age of Onset   ??? Diabetes Maternal Grandmother        Current Medications     Prior to Admission Medications   Prescriptions Last Dose Informant Patient Reported? Taking?   Cholecalciferol, Vitamin D3, (VITAMIN D) 1,000 unit Cap   Yes No   Sig: Take 50,000 Units by mouth every seven (7) days.   amitriptyline (ELAVIL) 50 mg tablet   No No   Sig: Take 1 Tab by mouth nightly.   apixaban (ELIQUIS) 5 mg tablet   No No   Sig: Take 1 Tab by mouth every twelve (12) hours.   Patient taking differently: Take 2.5 mg by mouth every twelve (12) hours.   ascorbic acid, vitamin C, (VITAMIN C) 250 mg tablet   No No   Sig: Take 1 Tab by mouth two (2) times a day. Take with iron pill   cyclobenzaprine (FLEXERIL) 10 mg tablet   Yes No   Sig: Take 10 mg by mouth three (3) times daily as needed for Muscle Spasm(s).   lamoTRIgine (LAMICTAL) 100 mg tablet   No No   Sig: Take 1 Tab by mouth two (2) times a day.   methocarbamol (ROBAXIN) 750 mg tablet   No No   Sig: Take 2 Tabs by mouth four (4) times daily.   ondansetron hcl (ZOFRAN) 4 mg tablet   No No   Sig: Take 1 Tab by mouth every eight (8) hours as needed for Nausea.   oxyCODONE IR (OXY-IR) 15 mg immediate release tablet   No No   Sig: Take 1 Tab by mouth four (4) times daily. Max Daily Amount: 60 mg.   pantoprazole (PROTONIX) 40 mg tablet  No No   Sig: Take 1 Tab by mouth two (2) times a day.   predniSONE (DELTASONE) 20 mg tablet   No No   Sig: Tale 3 tabs daily for 3 days, then 2 tabs daily for 3 days, then one tab daily for three days by mouth    pregabalin (LYRICA) 25 mg capsule   No No   Sig: Take 1 Cap by mouth two (2) times a day. Max Daily Amount: 50 mg.   simvastatin (ZOCOR) 80 mg tablet   Yes No   Sig: Take 80 mg by mouth nightly.   sucralfate (CARAFATE) 100 mg/mL suspension   Yes No   Sig: Take 1 tsp by mouth two (2) times a day.      Facility-Administered Medications: None       Allergies     Allergies   Allergen Reactions   ??? Tape [Adhesive] Itching     Paper tape      ??? Aspirin Not Reported This Time     Because of hx ulcers per patient   ??? Opana [Oxymorphone] Rash and Itching   ??? Tylenol [Acetaminophen] Itching       Physical Exam     ED Triage Vitals   ED Encounter Vitals Group      BP       Pulse       Resp       Temp       Temp src       SpO2       Weight       Height        Constitutional:Well-developed, awake, alert, appears uncomfortable  HEENT: anicteric sclera nasopharynx and oropharynx unremarkable. Normocephalic Atraumatic pupils PERRL extraocular muscles intact   Neck: supple no masses nontender  Respiratory: lungs clear to auscultation nonlabored respirations chest wall tender left anterolateral ribs, no palpable abnormalities or crepitus  Cardiovascular: heart regular rate and rhythm without murmur rubs or gallops   Gastrointestinal: abdomen soft nontender to palpation no masses, rebound or guarding.    Back tender thoracic spine and paraspinal musculature, lumbar spine nontender, no ecchymosis or swelling  Extremities: no edema full range of motion  Skin: warm and dry   Neurologic: alert and oriented, sensory and motor intact grossly nonfocal exam       Impression and Management Plan   Duration following a fall with left rib and upper back pain, rule out bony or pulmonary injury, abdomen is soft with no focal tenderness, will rule out thoracic spine injury as well.    Diagnostic Studies       Imaging:    Xr Spine Thorac 2 V    Result Date: 10/01/2017  Indication: Injury with pain 3 views thoracic spine show generalized  osteopenia with no fracture, subluxation, or other significant findings. Staples overlie upper abdomen.     IMPRESSION: No acute abnormality.     Xr Ribs Lt W Pa Cxr Min 3 V    Result Date: 10/01/2017  Indication: Left rib injury with pain 4 views chest and left ribs show normal heart size and clear lungs with no pneumothorax or effusion. Staples overlie left and right upper quadrants. Mildly displaced lateral left eighth and ninth rib fractures are seen with mild smoothing of eighth rib margins.     IMPRESSION: Left rib fractures as described, new from 02/18/2017 at the ninth rib and likely healing at the eighth rib.         ED  Course/Medical Decision Making    Patient observed in the emergency department, discussed the test results with the patient, will be instructed on the use of an incentive spirometer, instructed to continue her present medications, she is more comfortable after receiving Percocet in the emergency department.  Patient will not be discharged home with any additional prescriptions as she has medications at home.  Medications   oxyCODONE-acetaminophen (PERCOCET) 5-325 mg per tablet 2 Tab (2 Tabs Oral Given 10/01/17 1348)     Final Diagnosis   Left rib fractures    Disposition   Home    Konrad FelixLewis H Yancarlos Berthold, MD  October 01, 2017    My signature above authenticates this document and my orders, the final ??  diagnosis (es), discharge prescription (s), and instructions in the Epic ??  record.  If you have any questions please contact [phone].  ??  Nursing notes have been reviewed by the physician/ advanced practice ??  Clinician.    Dragon medical dictation software was used for portions of this report. Unintended voice recognition grammatical errors may occur.

## 2017-10-01 NOTE — ED Notes (Signed)
4:31 PM  10/01/17     Discharge instructions given to patient (name) with verbalization of understanding. Patient accompanied by daughter.  Patient discharged with the following prescriptions none. Patient discharged to home (destination).      Madaline BrilliantKristin N Moran, RN

## 2017-11-13 ENCOUNTER — Emergency Department: Admit: 2017-11-14 | Payer: PRIVATE HEALTH INSURANCE | Primary: Internal Medicine

## 2017-11-13 ENCOUNTER — Emergency Department: Admit: 2017-11-13 | Payer: PRIVATE HEALTH INSURANCE | Primary: Internal Medicine

## 2017-11-13 ENCOUNTER — Emergency Department: Payer: PRIVATE HEALTH INSURANCE | Primary: Internal Medicine

## 2017-11-13 ENCOUNTER — Inpatient Hospital Stay
Admit: 2017-11-13 | Discharge: 2017-11-14 | Disposition: A | Discharge status: Home Health | Payer: PRIVATE HEALTH INSURANCE | Attending: Emergency Medicine

## 2017-11-13 ENCOUNTER — Observation Stay

## 2017-11-13 DIAGNOSIS — I82402 Acute embolism and thrombosis of unspecified deep veins of left lower extremity: Secondary | ICD-10-CM

## 2017-11-13 LAB — METABOLIC PANEL, COMPREHENSIVE
ALT (SGPT): 15 U/L (ref 12–78)
AST (SGOT): 19 U/L (ref 15–37)
Albumin: 3.6 gm/dl (ref 3.4–5.0)
Alk. phosphatase: 102 U/L (ref 45–117)
Anion gap: 5 mmol/L (ref 5–15)
BUN: 15 mg/dl (ref 7–25)
Bilirubin, total: 0.2 mg/dl (ref 0.2–1.0)
CO2: 28 mEq/L (ref 21–32)
Calcium: 8.8 mg/dl (ref 8.5–10.1)
Chloride: 111 mEq/L — ABNORMAL HIGH (ref 98–107)
Creatinine: 0.9 mg/dl (ref 0.6–1.3)
GFR est AA: >60
GFR est non-AA: >60
Glucose: 118 mg/dl — ABNORMAL HIGH (ref 74–106)
Potassium: 3.7 mEq/L (ref 3.5–5.1)
Protein, total: 7.4 gm/dl (ref 6.4–8.2)
Sodium: 144 mEq/L (ref 136–145)

## 2017-11-13 LAB — POC CHEM8
BUN: 13 mg/dl (ref 7–25)
CALCIUM,IONIZED: 4.6 mg/dL (ref 4.40–5.40)
CO2, TOTAL: 26 mmol/L (ref 21–32)
Chloride: 106 mEq/L (ref 98–107)
Creatinine: 0.8 mg/dl (ref 0.6–1.3)
Glucose: 117 mg/dL — ABNORMAL HIGH (ref 74–106)
HCT: 30 % — ABNORMAL LOW (ref 38–45)
HGB: 10.2 gm/dl — ABNORMAL LOW (ref 12.4–17.2)
Potassium: 3.6 mEq/L (ref 3.5–4.9)
Sodium: 144 mEq/L (ref 136–145)

## 2017-11-13 LAB — LIPASE: Lipase: 100 U/L (ref 73–393)

## 2017-11-13 LAB — CBC WITH AUTOMATED DIFF
BASOPHILS: 0.5 % (ref 0–3)
EOSINOPHILS: 2.7 % (ref 0–5)
HCT: 28.2 % — ABNORMAL LOW (ref 37.0–50.0)
HGB: 8.5 gm/dl — ABNORMAL LOW (ref 13.0–17.2)
IMMATURE GRANULOCYTES: 0.2 % (ref 0.0–3.0)
LYMPHOCYTES: 31.6 % (ref 28–48)
MCH: 21.4 pg — ABNORMAL LOW (ref 25.4–34.6)
MCHC: 30.1 gm/dl (ref 30.0–36.0)
MCV: 71 fL — ABNORMAL LOW (ref 80.0–98.0)
MONOCYTES: 6.3 % (ref 1–13)
MPV: 9.7 fL (ref 6.0–10.0)
NEUTROPHILS: 58.7 % (ref 34–64)
NRBC: 0 (ref 0–0)
PLATELET: 334 10*3/uL (ref 140–450)
RBC: 3.97 M/uL (ref 3.60–5.20)
RDW-SD: 53.1 — ABNORMAL HIGH (ref 36.4–46.3)
WBC: 6 10*3/uL (ref 4.0–11.0)

## 2017-11-13 LAB — TROPONIN I: Troponin-I: <0.015 ng/ml (ref 0.000–0.045)

## 2017-11-13 MED ORDER — IOPAMIDOL 76 % IV SOLN
370 mg iodine /mL (76 %) | Freq: Once | INTRAVENOUS | Status: AC
Start: 2017-11-13 — End: 2017-11-13
  Administered 2017-11-13: 23:00:00 via INTRAVENOUS

## 2017-11-13 MED ORDER — MORPHINE 4 MG/ML SYRINGE
4 mg/mL | INTRAMUSCULAR | Status: AC
Start: 2017-11-13 — End: 2017-11-13
  Administered 2017-11-13: 22:00:00 via INTRAVENOUS

## 2017-11-13 MED ORDER — SODIUM CHLORIDE 0.9 % IJ SYRG
Freq: Once | INTRAMUSCULAR | Status: AC
Start: 2017-11-13 — End: 2017-11-13
  Administered 2017-11-13: 22:00:00 via INTRAVENOUS

## 2017-11-13 MED ORDER — ONDANSETRON (PF) 4 MG/2 ML INJECTION
4 mg/2 mL | Freq: Once | INTRAMUSCULAR | Status: AC
Start: 2017-11-13 — End: 2017-11-13
  Administered 2017-11-13: 22:00:00 via INTRAVENOUS

## 2017-11-13 MED ORDER — SODIUM CHLORIDE 0.9% BOLUS IV
0.9 % | INTRAVENOUS | Status: AC
Start: 2017-11-13 — End: 2017-11-13
  Administered 2017-11-13: 22:00:00 via INTRAVENOUS

## 2017-11-13 MED FILL — MORPHINE 4 MG/ML SYRINGE: 4 mg/mL | INTRAMUSCULAR | Qty: 1

## 2017-11-13 MED FILL — ONDANSETRON (PF) 4 MG/2 ML INJECTION: 4 mg/2 mL | INTRAMUSCULAR | Qty: 2

## 2017-11-13 MED FILL — ISOVUE-370  76 % INTRAVENOUS SOLUTION: 370 mg iodine /mL (76 %) | INTRAVENOUS | Qty: 80

## 2017-11-13 NOTE — ED Provider Notes (Signed)
[facility]  Emergency Department Treatment Report        Patient: [name] [name] Age: 67 y.o. Sex: female    Date of Birth: 1950-12-29 Admit Date: 11/13/2017 PCP: Gwendel Hanson, MD   MRN: [id number]  CSN: [id number]     Room: ER24/ER24 Time Dictated: 5:21 PM      Attending MD: Minna Merritts, MD  APC:  Maryanna Shape PA-C    Chief Complaint   Fall, shoulder and leg pain    History of Present Illness   67 y.o. female presents the ER for evaluation after a fall off the porch.  Patient states she was chasing after a small child who she thought was in a fall off the porch and she lost her balance and fell landing on her left side.  She had the back of her head, did not lose consciousness.  She has a mild occipital headache, neck pain.  She has pain in her low back, left leg.  Says her left abdomen hurts.  Denies any chest pain now but states 2 nights ago she had chest pain associated with vomiting, denies dyspnea.  Since then she is been having dizzy spells and feeling presyncopal, this happened multiple times today.  Family was trying to get her to come to the hospital, however she thought it would pass.  Says her urine has been dark.  Denies diarrhea or dysuria.    Review of Systems   Review of Systems   Constitutional: Negative for chills, fever and malaise/fatigue.   HENT: Negative for nosebleeds and sore throat.    Eyes: Negative for blurred vision, double vision and discharge.   Respiratory: Negative for cough and shortness of breath.    Cardiovascular: Positive for chest pain (2 nights ago, sharp and sternal, associated with vomiting, resolved now). Negative for palpitations and leg swelling.   Gastrointestinal: Negative for constipation, diarrhea, nausea and vomiting.   Genitourinary: Negative for dysuria, hematuria and urgency.   Musculoskeletal: Positive for back pain and joint pain (Left leg pain).   Skin: Negative for rash.    Neurological: Positive for dizziness and headaches. Negative for tingling, sensory change, focal weakness, seizures and loss of consciousness.   Psychiatric/Behavioral: The patient is nervous/anxious (Patient is tearful from pain).      Past Medical/Surgical History     Past Medical History:   Diagnosis Date   ??? Acute hypercapnic respiratory failure ([place]) 02/05/2013    W/ Encephalopathy requiring BiPAP, Etiology Uncertain however Possible Narcotic Benzodiazipine Related?    ??? Adjustment disorder with mixed anxiety and depressed mood    ??? Aneurysm of internal carotid artery    ??? Arthritis of knee    ??? Asthma    ??? Autoimmune disease ([place])    ??? CAD (coronary artery disease)    ??? Cervicalgia    ??? Chest pain 03/17/2014    Dobutamine Stress Echo: NORMAL WALL MOTION AND GLOBAL SYSTOLIC FUNCTION AT REST AND AFTER?? STRESS/EXERCISE WITH APPROPRIATE AUGMENTATION OF FUNCTION IN ALL SEGMENTS. NO EVIDENCE OF INDUCIBLE ISCHEMIA AT ADEQUATE HEART RATE WITH DOBUTAMINE STRESS.??            ??? Chronic low back pain 10/22/2014    [facility] MRI Lumbar w/o contrast: Moderate facet arthropathy at L4-L5 and L5-S1. Mild/moderate foraminal stenosis at these levels. Disc bulge with annular tear at L4-L5.    ??? Chronic pain syndrome    ??? Coccydynia    ??? Diabetes mellitus     NIDDM   ???  DVT (deep venous thrombosis) ([place]) 05/19/2007    [place] CTA: 1. Small nonocclusive pulmonary embolus in a subsegmental branch to the left lower lobe. Larger filling defect in a segmental branch to the right lower lobe, not as low in attenuation as is normally seen with a pulmonary embolus, however is seen in multiple planes and remains concerning for an additional nonocclusive pulmonary embolus.   ??? Fibromyalgia    ??? Gastric ulcer    ??? Gastritis 01/03/2015    [place] Admission, EGD + H. Pylori/Gastritis   ??? Generalized osteoarthritis of multiple sites    ??? GI bleed 01/01/2015    [place] Admit 5/2-01/07/2015   ??? H. pylori infection 01/03/2015     [place] Admission, EGD + H. Pylori/Gastritis   ??? Hemiparesis, right ([place])    ??? Hyperlipidemia    ??? Hypertension    ??? Irregular sleep-wake rhythm    ??? Knee joint pain    ??? Lumbar spondylosis     Orthopedic Spine Surgeon: Dr. Barnabas Lister L. Sonny Masters    ??? Muscle spasm    ??? Normocytic anemia     Chronic Iron Deficiency Anemia   ??? Osteoporosis    ??? Pancreatitis    ??? Peripheral neuropathy    ??? Pulmonary embolism (HCC)     Multiple   ??? Seizures ([place])    ??? Sensory ataxia    ??? SLE (systemic lupus erythematosus) ([place])    ??? Stroke [facility])    ??? Subclinical hypothyroidism    ??? Vertigo    ??? Vitamin D deficiency      Past Surgical History:   Procedure Laterality Date   ??? CARDIAC SURG PROCEDURE UNLIST      Cardiac Cath PCI w/ Stents   ??? HX CHOLECYSTECTOMY     ??? HX GI      Partial Gastrectomy    ??? HX GI  01/03/2015    [facility] EGD by Dr. Darnell Level D. [place]: S/P B-2 Gastric Resection, Gastritis in small remnant, Bx of Jejunum r/o Celiac Disease and of Stomach. + Gastritis + H. Pylori   ??? HX HYSTERECTOMY       Social History     Social History     Socioeconomic History   ??? Marital status: DIVORCED     Spouse name: Not on file   ??? Number of children: Not on file   ??? Years of education: Not on file   ??? Highest education level: Not on file   Social Needs   ??? Financial resource strain: Not on file   ??? Food insecurity - worry: Not on file   ??? Food insecurity - inability: Not on file   ??? Transportation needs - medical: Not on file   ??? Transportation needs - non-medical: Not on file   Occupational History   ??? Not on file   Tobacco Use   ??? Smoking status: Former Smoker   ??? Smokeless tobacco: Never Used   Substance and Sexual Activity   ??? Alcohol use: No   ??? Drug use: No   ??? Sexual activity: Not Currently     Partners: Male   Other Topics Concern   ??? Not on file   Social History Narrative   ??? Not on file     Family History     Family History   Problem Relation Age of Onset   ??? Diabetes Maternal Grandmother      Current Medications      Prior to Admission Medications   Prescriptions  Last Dose Informant Patient Reported? Taking?   Cholecalciferol, Vitamin D3, (VITAMIN D) 1,000 unit Cap   Yes No   Sig: Take 50,000 Units by mouth every seven (7) days.   amitriptyline (ELAVIL) 50 mg tablet   No No   Sig: Take 1 Tab by mouth nightly.   apixaban (ELIQUIS) 5 mg tablet   No No   Sig: Take 1 Tab by mouth every twelve (12) hours.   Patient taking differently: Take 2.5 mg by mouth every twelve (12) hours.   ascorbic acid, vitamin C, (VITAMIN C) 250 mg tablet   No No   Sig: Take 1 Tab by mouth two (2) times a day. Take with iron pill   cyclobenzaprine (FLEXERIL) 10 mg tablet   Yes No   Sig: Take 10 mg by mouth three (3) times daily as needed for Muscle Spasm(s).   lamoTRIgine (LAMICTAL) 100 mg tablet   No No   Sig: Take 1 Tab by mouth two (2) times a day.   methocarbamol (ROBAXIN) 750 mg tablet   No No   Sig: Take 2 Tabs by mouth four (4) times daily.   ondansetron hcl (ZOFRAN) 4 mg tablet   No No   Sig: Take 1 Tab by mouth every eight (8) hours as needed for Nausea.   oxyCODONE IR (OXY-IR) 15 mg immediate release tablet   No No   Sig: Take 1 Tab by mouth four (4) times daily. Max Daily Amount: 60 mg.   pantoprazole (PROTONIX) 40 mg tablet   No No   Sig: Take 1 Tab by mouth two (2) times a day.   predniSONE (DELTASONE) 20 mg tablet   No No   Sig: Tale 3 tabs daily for 3 days, then 2 tabs daily for 3 days, then one tab daily for three days by mouth   pregabalin (LYRICA) 25 mg capsule   No No   Sig: Take 1 Cap by mouth two (2) times a day. Max Daily Amount: 50 mg.   simvastatin (ZOCOR) 80 mg tablet   Yes No   Sig: Take 80 mg by mouth nightly.   sucralfate (CARAFATE) 100 mg/mL suspension   Yes No   Sig: Take 1 tsp by mouth two (2) times a day.      Facility-Administered Medications: None     Allergies     Allergies   Allergen Reactions   ??? Tape [Adhesive] Itching     Paper tape      ??? Aspirin Not Reported This Time     Because of hx ulcers per patient    ??? Opana [Oxymorphone] Rash and Itching   ??? Tylenol [Acetaminophen] Itching     Physical Exam     Visit Vitals  BP 147/83   Pulse 74   Temp 98.2 ??F (36.8 ??C)   Resp 12   Ht '5\' 7"'$  (1.702 m)   Wt 65.3 kg (144 lb)   SpO2 100%   BMI 22.55 kg/m??       Physical Exam   Constitutional: She is oriented to person, place, and time. She appears not dehydrated.  Non-toxic appearance. She does not have a sickly appearance.   HENT:   Head: Normocephalic and atraumatic.   Mouth/Throat: Oropharynx is clear and moist.   Eyes: Conjunctivae and EOM are normal. Pupils are equal, round, and reactive to light.   Neck: Normal range of motion. Neck supple.   Cardiovascular: Normal rate, regular rhythm and intact distal pulses.  No murmur heard.  Pulmonary/Chest: Effort normal and breath sounds normal. She has no wheezes. She exhibits no tenderness.   Abdominal: Soft. There is tenderness (LUQ/LLQ- no brusing or abrasions).   Musculoskeletal:   Patient planes of pain in her C-spine.  She is in a c-collar which I left in place.  Tenderness is kind of diffuse without focal step-off or deformity.  Nontender in the T-spine.  She complains of some pain in her low back which is also diffuse but is present around L2 and L3.    Patient complains of pain with movement of her left leg.  Again is not focal, any attempt to move any joint causes her pain.  There is no bruising or deformity noted.    Left upper extremity she complains of mild general soreness but has intact full range of motion without joint tenderness on palpation- no bruising or deformity.    Right upper extremity intact full range of motion without pain.    Right lower extremity intact full range of motion without pain.   Neurological: She is alert and oriented to person, place, and time. She has normal sensation and normal strength. She is not agitated and not disoriented. She displays no tremor, facial symmetry and normal speech. Coordination normal. GCS score is 15.    Skin: Skin is warm, dry and intact. No abrasion, no bruising and no laceration noted.   Psychiatric:   Patient is appears anxious, tearful from pain     Impression and Management Plan   Patient presenting after fall having pain in her neck back closed head injury on Eliquis, left lower leg.  She will need x-rays and CTs to rule out fracture or intercranial bleed.  Also with chest pain 2 nights ago associated with vomiting and intermittent dizziness and presyncope since.  Will need rule out of ACS, anemia, electrode insufficiency, arrhythmia.  Obtain labs, EKG, chest x-ray.    Diagnostic Studies   Lab:   Recent Results (from the past 12 hour(s))   CBC WITH AUTOMATED DIFF    Collection Time: 11/13/17  5:30 PM   Result Value Ref Range    WBC 6.0 4.0 - 11.0 1000/mm3    RBC 3.97 3.60 - 5.20 M/uL    HGB 8.5 (L) 13.0 - 17.2 gm/dl    HCT 28.2 (L) 37.0 - 50.0 %    MCV 71.0 (L) 80.0 - 98.0 fL    MCH 21.4 (L) 25.4 - 34.6 pg    MCHC 30.1 30.0 - 36.0 gm/dl    PLATELET 334 140 - 450 1000/mm3    MPV 9.7 6.0 - 10.0 fL    RDW-SD 53.1 (H) 36.4 - 46.3      NRBC 0 0 - 0      IMMATURE GRANULOCYTES 0.2 0.0 - 3.0 %    NEUTROPHILS 58.7 34 - 64 %    LYMPHOCYTES 31.6 28 - 48 %    MONOCYTES 6.3 1 - 13 %    EOSINOPHILS 2.7 0 - 5 %    BASOPHILS 0.5 0 - 3 %   METABOLIC PANEL, COMPREHENSIVE    Collection Time: 11/13/17  5:30 PM   Result Value Ref Range    Sodium 144 136 - 145 mEq/L    Potassium 3.7 3.5 - 5.1 mEq/L    Chloride 111 (H) 98 - 107 mEq/L    CO2 28 21 - 32 mEq/L    Glucose 118 (H) 74 - 106 mg/dl    BUN 15 7 -  25 mg/dl    Creatinine 0.9 0.6 - 1.3 mg/dl    GFR est AA >60.0      GFR est non-AA >60      Calcium 8.8 8.5 - 10.1 mg/dl    AST (SGOT) 19 15 - 37 U/L    ALT (SGPT) 15 12 - 78 U/L    Alk. phosphatase 102 45 - 117 U/L    Bilirubin, total 0.2 0.2 - 1.0 mg/dl    Protein, total 7.4 6.4 - 8.2 gm/dl    Albumin 3.6 3.4 - 5.0 gm/dl    Anion gap 5 5 - 15 mmol/L   TROPONIN I    Collection Time: 11/13/17  5:30 PM   Result Value Ref Range     Troponin-I <0.015 0.000 - 0.045 ng/ml   LIPASE    Collection Time: 11/13/17  5:30 PM   Result Value Ref Range    Lipase 100 73 - 393 U/L   POC CHEM8    Collection Time: 11/13/17  6:03 PM   Result Value Ref Range    Sodium 144 136 - 145 mEq/L    Potassium 3.6 3.5 - 4.9 mEq/L    Chloride 106 98 - 107 mEq/L    CO2, TOTAL 26 21 - 32 mmol/L    Glucose 117 (H) 74 - 106 mg/dL    BUN 13 7 - 25 mg/dl    Creatinine 0.8 0.6 - 1.3 mg/dl    HCT 30 (L) 38 - 45 %    HGB 10.2 (L) 12.4 - 17.2 gm/dl    CALCIUM,IONIZED 4.60 4.40 - 5.40 mg/dL   POC URINE MACROSCOPIC    Collection Time: 11/13/17  9:15 PM   Result Value Ref Range    Glucose Negative NEGATIVE,Negative mg/dl    Bilirubin Negative NEGATIVE,Negative      Ketone Negative NEGATIVE,Negative mg/dl    Specific gravity 1.015 1.005 - 1.030      Blood Negative NEGATIVE,Negative      pH (UA) 7.0 5 - 9      Protein Negative NEGATIVE,Negative mg/dl    Urobilinogen 0.2 0.0 - 1.0 EU/dl    Nitrites Negative NEGATIVE,Negative      Leukocyte Esterase Negative NEGATIVE,Negative      Color Yellow      Appearance Clear       Labs Reviewed   CBC WITH AUTOMATED DIFF - Abnormal; Notable for the following components:       Result Value    HGB 8.5 (*)     HCT 28.2 (*)     MCV 71.0 (*)     MCH 21.4 (*)     RDW-SD 53.1 (*)     All other components within normal limits   METABOLIC PANEL, COMPREHENSIVE - Abnormal; Notable for the following components:    Chloride 111 (*)     Glucose 118 (*)     All other components within normal limits   POC CHEM8 - Abnormal; Notable for the following components:    Glucose 117 (*)     HCT 30 (*)     HGB 10.2 (*)     All other components within normal limits   TROPONIN I   LIPASE   TROPONIN I   POC URINE MACROSCOPIC   CREATININE, POC       Imaging:    Xr Chest Sngl V    Result Date: 11/13/2017  EXAM:  Chest AP INDICATIONS: chest pain 2 days ago, fall today COMPARISON: 08/21/2016. FINDINGS: Hyperinflated lungs,  consistent with COPD. No  consolidation, pleural effusion, or pulmonary edema. No pneumothorax. Normal cardiomediastinal contours.     IMPRESSION: 1.  No acute cardiopulmonary process. 2.  COPD.     Xr Hip Lt W Or Wo Pelv 2-3 Vws    Result Date: 11/13/2017  INDICATION: injury; fall, left leg pain. TECHNIQUE: Frontal view of the pelvis and frog-leg lateral view of the left hip. COMPARISON: None. FINDINGS: Normal hip joint alignment. No fracture or avascular necrosis. Joint spaces are preserved. No significant soft tissue abnormality.     IMPRESSION: No acute abnormality of the left hip.     Xr Knee Lt Min 4 V    Result Date: 11/13/2017  INDICATION: fall, left leg pain;    TECHNIQUE: Frontal, lateral, oblique, and tunnel views of the left knee. COMPARISON: None. FINDINGS: Normal bone alignment. No fracture. No joint effusion. Joint spaces are preserved. No significant soft tissue abnormality.     IMPRESSION: No acute abnormality of the left knee.     Xr Ankle Lt Min 3 V    Result Date: 11/13/2017  INDICATION: injury; fall, left leg pain. TECHNIQUE: Frontal, lateral, and oblique views of the left ankle. COMPARISON: None. FINDINGS: Normal bone alignment. Chronic fracture deformity of the second metatarsal. No acute fracture. Joint spaces are preserved. No significant soft tissue abnormality.     IMPRESSION: No acute abnormality of the left ankle.     Ct Head Wo Cont    Result Date: 11/13/2017  INDICATION: CHI, headache on eliquis. [place]. COMPARISON: 04/04/2017. TECHNIQUE: Axial Head CT without IV contrast. Sagittal and coronal reconstructions were performed. DICOM format imaged data is available to non-affiliated external healthcare facilities or entities on a secure, media free, reciprocal searchable basis with patient authorization for 12 months following the date of the study. FINDINGS: Ventricles and sulci are normal in size and configuration for the patient's age. No extra axial collection. No acute intracranial  hemorrhage. No mass effect or edema. There are scattered foci of white matter hypoattenuation, which are nonspecific but most consistent with microangiopathy. No CT evidence of acute large territorial infarction. Mild mucosal thickening in the ethmoid air cells. Calvarium is unremarkable. Atherosclerotic vascular calcifications in the cavernous carotid arteries.     IMPRESSION: 1.  No acute intracranial hemorrhage or mass effect. 2.  Mild chronic age-related changes.      Ct Spine Cerv Wo Cont    Result Date: 11/13/2017  CT cervical spine without contrast Indication: Recent trauma, spine; fall, midline c-spine pain    Comparison: 04/04/2017. Technique: CT images of the cervical spine were obtained without intravenous contrast administration. Coronal and sagittal reformations were provided for further characterization. DICOM format imaged data is available to non-affiliated external healthcare facilities or entities on a secure, media free, reciprocal searchable basis with patient authorization for 12 months following the date of the study. Findings: Mild degenerative retrolisthesis at C3-4. Mild degenerative anterolisthesis at C4-5 and C5-6. Vertebral body heights are maintained.  The dens is intact. No evidence of fracture. No prevertebral soft tissue edema. Multilevel, multifactorial spondylosis is present throughout the cervical spine with no high-grade osseous narrowing of the spinal canal. Uncovertebral joint hypertrophy contributes to at least moderate narrowing of the right foramen at C4-5. Thyroid gland and partially imaged lung apices are unremarkable.     Impression: 1.  No acute fracture or malalignment of the cervical spine. 2.  Moderate multilevel cervical spondylosis.     Ct Abd Pelv W Cont  Result Date: 11/13/2017  INDICATION: Fall. Left-sided abdominal pain. COMPARISON: 04/04/2017. TECHNIQUE: CT of the abdomen and pelvis WITH intravenous contrast. Coronal  and sagittal reformatted images were produced. DICOM format imaged data is available to non-affiliated external healthcare facilities or entities on a secure, media free, reciprocal searchable basis with patient authorization for 12 months following the date of the study. CONTRAST: 80 mL Isovue-370. COMMENTS: LOWER THORAX: Dependent changes in the lungs. HEPATOBILIARY: No focal hepatic lesions. Cholecystectomy. Marked diffuse intrahepatic and extra hepatic biliary duct dilatation. The common bile duct measures up to 2.9 cm. This is not significantly changed since the prior study. No calcified biliary duct stones. SPLEEN: No splenomegaly. PANCREAS: No focal masses or ductal dilatation. ADRENALS: No adrenal nodules. KIDNEYS/URETERS: Subcentimeter hypoattenuating lesion on the upper pole of the right kidney, too small to characterize. No hydronephrosis, stones, or solid mass lesions. PELVIC ORGANS/BLADDER: Hysterectomy. No adnexal mass. The bladder is distended, but otherwise unremarkable. PERITONEUM / RETROPERITONEUM: No free air or fluid. LYMPH NODES: No enlarged mesenteric, retroperitoneal, or pelvic lymph nodes. VESSELS: Atherosclerotic vascular calcifications. 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 No obstruction. No wall thickening. Large amount of stool in the colon. BONES AND SOFT TISSUES: Mild multilevel degenerative changes of the spine. The soft tissues are unremarkable.     IMPRESSION: 1.  Marked biliary duct dilatation, unchanged. Cholecystectomy. 2.  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 No obstruction. 3.  Large amount of stool in the colon, suggesting constipation.     EKG Interpretation:  Dr. Sandi Mealy did not see any acute S-T segment or T-wave abnormalities that are consistent with acute ischemia or infarction.  NSR rate 85, QTc 418.    ED Course     Patient was maintained on cardiac, oxygen, and blood pressure monitoring without arrhythmia or event.     Patient Vitals for the past 12 hrs:   Temp Pulse Resp BP SpO2   11/13/17 2201 ??? 74 12 147/83 100 %   11/13/17 2132 ??? 78 13 (!) 155/97 100 %   11/13/17 2101 ??? 83 17 134/75 96 %   11/13/17 2032 ??? 83 14 155/88 99 %   11/13/17 1931 98.2 ??F (36.8 ??C) 83 13 134/74 96 %   11/13/17 1920 ??? 81 11 ??? 99 %   11/13/17 1918 ??? ??? ??? 138/76 98 %   11/13/17 1722 98.2 ??F (36.8 ??C) 87 25 (!) 150/100 100 %       ED Course as of Nov 13 2229   Fri Nov 13, 2017   1752 Discussed the patient's history, physical and plan with Dr. Sandi Mealy who is in agreement.    [EI]   [facility] records reviewed, patient had had a normal 2D echo last year, negative nuclear stress test in 2017.  [EI]   1803 WBC: 6.0 [EI]   1803 Chronic, stable HGB: (!) 8.5 [EI]   1803 HCT: (!) 28.2 [EI]   1811 Creatinine: 0.8 [EI]   1811 BUN: 13 [EI]   1811 Potassium: 3.6 [EI]   1811 Sodium: 144 [EI]   1834 Lipase: 100 [EI]   1834 Troponin-I: <0.015 [EI]   1834 Creatinine: 0.9 [EI]   1834 BUN: 15 [EI]   1834 Potassium: 3.7 [EI]   1834 ALT (SGPT): 15 [EI]   1834 AST: 19 [EI]   1834 Bilirubin, total: 0.2 [EI]   1926 BP: 138/76 [EI]   2017 IMPRESSION:   1. ??Marked biliary duct dilatation, unchanged. Cholecystectomy.  2. ??Postoperative changes from prior gastric  bypass. No obstruction.  3. ??Large amount of stool in the colon, suggesting constipation. CT ABD PELV W CONT [EI]   2017 Impression:    1. ??No acute fracture or malalignment of the cervical spine.  2. ??Moderate multilevel cervical spondylosis. CT SPINE CERV WO CONT [EI]   2018 IMPRESSION:  1. ??No acute intracranial hemorrhage or mass effect.  2. ??Mild chronic age-related changes. CT HEAD WO CONT [EI]   2035 Patient was evaluated by Dr. Dyke Maes who discussed test results the patient.  Given that she had some chest pain and intermittent presyncopal spells feel she needs to be admitted to the hospital.  Patient is now having some tingling in her left leg, increased pain.  Will repeat  morphine, check rectal tone and reflexes.  C-collar removed.  [EI]   2035 IMPRESSION:  No acute abnormality of the left knee. XR KNEE LT MIN 4 V [EI]   2036 IMPRESSION:   1. ??No acute cardiopulmonary process.  2. ??COPD. XR CHEST SNGL V [EI]   2036 Impression     IMPRESSION:  No acute abnormality of the left ankle.     XR ANKLE LT MIN 3 V [EI]   2036 IMPRESSION:  No acute abnormality of the left hip. XR HIP LT AP/LAT MIN 2 V [EI]   2108 Patient reassessed.  Discussed all imaging was unremarkable.  Pain improved after morphine but continues to have discomfort in the left leg.    Says she is having an intermittent tingling in the left leg in the last 4-5 minutes at a time.  She is not having it right now.  It does not seem positional.  She cannot tell if there is weakness with it.    Patient rectal exam done with normal sphincter tone, stool is brown guaiac negative.  Patient has normal reflexes.  She can wiggle her toes, leg is difficult to assess strength because she has pain.  [EI]   2133 Nitrites: Negative [EI]   2134 Nitrites: Negative [EI]   2134 Leukocyte Esterase: Negative [EI]   2134 Blood: Negative [EI]   2134 Page placed to [facility] to discuss intermittent presyncope as well as left leg tingling.  [EI]   2218 Discussed with Dr. Lynnette Caffey who asked me to get plain films of her LS Spine which he will follow.  He accepts on observation telemetry for neuro checks and troponin trend, telemetry monitoring.  [EI]      ED Course User Index  [EI] Jenelle Mages, PA-C       Medications   sodium chloride (NS) flush 5-10 mL (10 mL IntraVENous Given 11/13/17 1811)   sodium chloride 0.9 % bolus infusion 500 mL (0 mL IntraVENous IV Completed 11/13/17 1949)   morphine injection 4 mg (4 mg IntraVENous Given 11/13/17 1811)   ondansetron (ZOFRAN) injection 4 mg (4 mg IntraVENous Given 11/13/17 1811)   iopamidol (ISOVUE-370) 76 % injection 80 mL (80 mL IntraVENous Given 11/13/17 1901)    morphine injection 4 mg (4 mg IntraVENous Given 11/13/17 2029)       Medical Decision Making   Patient presenting for evaluation after a fall off of a porch landing on her left side.  Her plain film imaging and CT of her head C-spine and abdomen pelvis is unremarkable.  She continues to have pain although better after parenteral medications.  She is on Eliquis with head injury so she will be brought in for neuro checks.  She is having some intermittent paresthesias  in the left leg with normal reflexes and normal sphincter tone, continue neurochecks for this as well.  Plain films of her back of been added at Dr. Corena Pilgrim request at time of admission for him to follow.  Also discussed her chest pain last night with vomiting, dizziness today and presyncope.  She will have telemetry monitoring and troponin trend, initial workup here is been unremarkable and she has not been symptomatic in the ED.    Final Diagnosis       ICD-10-CM ICD-9-CM   1. CHI (closed head injury), initial encounter S09.90XA 959.01   2. On continuous oral anticoagulation Z79.01 V58.61   3. Acute low back pain, unspecified back pain laterality, with sciatica presence unspecified M54.5 724.2   4. Left leg paresthesias R20.2 782.0   5. Dizziness R42 780.4   6. Chest pain, unspecified type R07.9 786.50   7. Chronic anemia D64.9 285.9     Disposition   Observation tele    The patient was personally evaluated by myself and [facility], EMILY A, MD who agrees with the above assessment and plan.    Amadi Frady E. Gustavus Messing  November 13, 2017    My signature above authenticates this document and my orders, the final ??  diagnosis (es), discharge prescription (s), and instructions in the Epic ??  record.  If you have any questions please contact [phone].  ??  Nursing notes have been reviewed by the physician/ advanced practice ??  Clinician.    Dragon medical dictation software was used for portions of this report. Unintended voice recognition errors may occur.

## 2017-11-13 NOTE — ED Notes (Signed)
Pain meds given, pt lying flat in bed, C collar removed, family at bedside, call bell within reach, denies any needs at this time

## 2017-11-13 NOTE — ED Notes (Signed)
Pt given water, denies any needs at this time, call bell within reach, family at bedside

## 2017-11-13 NOTE — ED Notes (Signed)
Pt lying flat in bed with C collar in place, family at bedside, given call bell, denies any needs at this time, waiting for C spine to be cleared

## 2017-11-13 NOTE — Other (Signed)
TRANSFER - OUT REPORT:    [facility] on [name] CourseVeronica E [name]  being transferred to 6 west (unit) for routine progression of care       Report consisted of patient???s Situation, Background, Assessment and   Recommendations(SBAR).     Information from the following report(s) SBAR was reviewed with the receiving nurse.    Lines:   Peripheral IV 11/13/17 Upper  (Active)   Site Assessment Clean, dry, & intact 11/13/2017  6:35 PM   Phlebitis Assessment 0 11/13/2017  6:35 PM   Infiltration Assessment 0 11/13/2017  6:35 PM   Dressing Status Clean, dry, & intact;New;Occlusive 11/13/2017  6:35 PM   Dressing Type Transparent 11/13/2017  6:35 PM   Hub Color/Line Status Pink;Capped;Flushed;Patent 11/13/2017  6:35 PM   Action Taken Open ports on tubing capped 11/13/2017  6:35 PM   Alcohol Cap Used Yes 11/13/2017  6:35 PM        Opportunity for questions and clarification was provided.      Patient transported with:   [employer]

## 2017-11-13 NOTE — H&P (Signed)
History and Physical    DOS: November 13, 2017    Patient: [name] [name]               Sex: female          DOA: 11/13/2017       Date of Birth:  July 30, 1951      Age:  67 y.o.        LOS:  LOS: 0 days       MRN: [id number]       Impression/Plan       67 y.o. female with hx of HTN, HLD, CAD s/p MI, Asthma/ COPD, Osteoporosis, PE/DVT on Eliquis, SLE, DM, and Seizure d/o with the following:        1.  CHI s/p fall   2.  Cervical Strain   3.  Dizziness   4.  Headache   5.  Abdominal Muscle Strain   6.  Contusion L leg   7.  Low back pain   8.  Numbness and tingling L leg   9.  Chest pain rule out ACS  10. PE/ DVT on Eliquis  11. HTN  12. HLD  13. CAD s/p MI  14. Asthma/ COPD  45. Osteoporosis  16. SLE  17. DM   18. Seizure disorder      PLAN:     Admit to OBS, tele  O2 as needed.  GI and DVT prophylaxis.  Replace electrolytes as needed.    Neurological checks every 2 hours x2 then every 4 hours and as needed.  Fall precautions.  Trend troponin levels.  Consider 2D echocardiogram in a.m.  Monitor H&H.  Transfuse if needed.  Consider repeat CT head without contrast in a.m. if patient's symptoms of headache and dizziness persist.  Consider neurology consult in a.m.if patient's neurological complaints persist as well.  Symptomatic treatment for arthralgias and myalgias.  Gentle hydration with IV fluids and monitor to avoid fluid overload.  Monitor blood glucose levels.  Glucomander per protocol.  Continue home medications when possible.  Further recommendations based on test results, response to treatment, and clinical course.        Chief Complaint:     Chief Complaint   Patient presents with   ??? Fall   ??? Shoulder Pain   ??? Leg Pain       HPI:     [name] [name] is a 67 y.o. female with hx of HTN, HLD, CAD s/p MI, Asthma/ COPD, Osteoporosis, SLE, DM, and Seizure d/o with c/o having a headache and multiple body aches and pains status post fall off a porch.   Patient states that she was chasing a small child who she thought was going to fall off porch.  She then lost her balance and fell off the porch from a height of 2 feet and landed on her left side.  She did hit the back of her head but did not lose consciousness.  She has a mild residual occipital headache and neck pain.  She denies any numbness or weakness of her extremities.  She has pain in her low back and left leg residual with intermittent paresthesias to her left lower extremity.  He does complain of left lower abdominal discomfort.  She states that with she had an episode of chest pain 2 nights ago associated with nausea and vomiting.  She states this is since resolved.  She does admit to having dizzy spells and feeling like she was going to pass  out on several occasions earlier in the day.  However she states this was not related to her fall.  She states that her urine has been dark as well but denies any dysuria or UTI symptoms.  She did not have any diarrhea or blood in her stools.      Past Medical History:   Diagnosis Date   ??? Acute hypercapnic respiratory failure ([place]) 02/05/2013    W/ Encephalopathy requiring BiPAP, Etiology Uncertain however Possible Narcotic Benzodiazipine Related?    ??? Adjustment disorder with mixed anxiety and depressed mood    ??? Aneurysm of internal carotid artery    ??? Arthritis of knee    ??? Asthma    ??? Autoimmune disease ([place])    ??? CAD (coronary artery disease)    ??? Cervicalgia    ??? Chest pain 03/17/2014    Dobutamine Stress Echo: NORMAL WALL MOTION AND GLOBAL SYSTOLIC FUNCTION AT REST AND AFTER?? STRESS/EXERCISE WITH APPROPRIATE AUGMENTATION OF FUNCTION IN ALL SEGMENTS. NO EVIDENCE OF INDUCIBLE ISCHEMIA AT ADEQUATE HEART RATE WITH DOBUTAMINE STRESS.??            ??? Chronic low back pain 10/22/2014    [facility] MRI Lumbar w/o contrast: Moderate facet arthropathy at L4-L5 and L5-S1. Mild/moderate foraminal stenosis at these levels. Disc bulge with annular tear at L4-L5.     ??? Chronic pain syndrome    ??? Coccydynia    ??? Diabetes mellitus     NIDDM   ??? DVT (deep venous thrombosis) ([place]) 05/19/2007    [place] CTA: 1. Small nonocclusive pulmonary embolus in a subsegmental branch to the left lower lobe. Larger filling defect in a segmental branch to the right lower lobe, not as low in attenuation as is normally seen with a pulmonary embolus, however is seen in multiple planes and remains concerning for an additional nonocclusive pulmonary embolus.   ??? Fibromyalgia    ??? Gastric ulcer    ??? Gastritis 01/03/2015    [facility] Admission, EGD + H. Pylori/Gastritis   ??? Generalized osteoarthritis of multiple sites    ??? GI bleed 01/01/2015    [facility] Admit 5/2-01/07/2015   ??? H. pylori infection 01/03/2015    [facility] Admission, EGD + H. Pylori/Gastritis   ??? Hemiparesis, right ([place])    ??? Hyperlipidemia    ??? Hypertension    ??? Irregular sleep-wake rhythm    ??? Knee joint pain    ??? Lumbar spondylosis     Orthopedic Spine Surgeon: Dr. Barnabas Lister L. Sonny Masters    ??? Muscle spasm    ??? Normocytic anemia     Chronic Iron Deficiency Anemia   ??? Osteoporosis    ??? Pancreatitis    ??? Peripheral neuropathy    ??? Pulmonary embolism (HCC)     Multiple   ??? Seizures ([place])    ??? Sensory ataxia    ??? SLE (systemic lupus erythematosus) ([place])    ??? Stroke [facility])    ??? Subclinical hypothyroidism    ??? Vertigo    ??? Vitamin D deficiency        Past Surgical History:   Procedure Laterality Date   ??? CARDIAC SURG PROCEDURE UNLIST      Cardiac Cath PCI w/ Stents   ??? HX CHOLECYSTECTOMY     ??? HX GI      Partial Gastrectomy    ??? HX GI  01/03/2015    [facility] EGD by Dr. Darnell Level D. [place]dholtz: S/P B-2 Gastric Resection, Gastritis in small remnant, Bx of Jejunum r/o  Celiac Disease and of Stomach. + Gastritis + H. Pylori   ??? HX HYSTERECTOMY         Family History   Problem Relation Age of Onset   ??? Diabetes Maternal Grandmother        Social History     Socioeconomic History   ??? Marital status: DIVORCED     Spouse name: Not on file   ??? Number of children: Not on file    ??? Years of education: Not on file   ??? Highest education level: Not on file   Tobacco Use   ??? Smoking status: Former Smoker   ??? Smokeless tobacco: Never Used   Substance and Sexual Activity   ??? Alcohol use: No   ??? Drug use: No   ??? Sexual activity: Not Currently     Partners: Male       Prior to Admission medications    Medication Sig Start Date End Date Taking? Authorizing Provider   amitriptyline (ELAVIL) 50 mg tablet Take 1 Tab by mouth nightly. 02/20/17   Holladay, Lorra Hals, MD   methocarbamol (ROBAXIN) 750 mg tablet Take 2 Tabs by mouth four (4) times daily. 02/20/17   Holladay, Lorra Hals, MD   ondansetron hcl (ZOFRAN) 4 mg tablet Take 1 Tab by mouth every eight (8) hours as needed for Nausea. 02/20/17   Holladay, Lorra Hals, MD   pantoprazole (PROTONIX) 40 mg tablet Take 1 Tab by mouth two (2) times a day. 02/20/17   Holladay, Lorra Hals, MD   predniSONE (DELTASONE) 20 mg tablet Tale 3 tabs daily for 3 days, then 2 tabs daily for 3 days, then one tab daily for three days by mouth 02/20/17   Holladay, Lorra Hals, MD   pregabalin (LYRICA) 25 mg capsule Take 1 Cap by mouth two (2) times a day. Max Daily Amount: 50 mg. 02/20/17   Holladay, Lorra Hals, MD   cyclobenzaprine (FLEXERIL) 10 mg tablet Take 10 mg by mouth three (3) times daily as needed for Muscle Spasm(s).    Provider, Historical   apixaban (ELIQUIS) 5 mg tablet Take 1 Tab by mouth every twelve (12) hours.  Patient taking differently: Take 2.5 mg by mouth every twelve (12) hours. 08/22/16   Mehmood, Khalid, MD   sucralfate (CARAFATE) 100 mg/mL suspension Take 1 tsp by mouth two (2) times a day.    Provider, Historical   ascorbic acid, vitamin C, (VITAMIN C) 250 mg tablet Take 1 Tab by mouth two (2) times a day. Take with iron pill 12/29/15   Elon Spanner, MD   oxyCODONE IR (OXY-IR) 15 mg immediate release tablet Take 1 Tab by mouth four (4) times daily. Max Daily Amount: 60 mg. 07/20/15   Barkley Boards, DO    simvastatin (ZOCOR) 80 mg tablet Take 80 mg by mouth nightly.    Other, Phys, MD   lamoTRIgine (LAMICTAL) 100 mg tablet Take 1 Tab by mouth two (2) times a day. 01/07/15   Stanton Kidney, MD   Cholecalciferol, Vitamin D3, (VITAMIN D) 1,000 unit Cap Take 50,000 Units by mouth every seven (7) days.    Provider, Historical       Allergies   Allergen Reactions   ??? Tape [Adhesive] Itching     Paper tape      ??? Aspirin Not Reported This Time     Because of hx ulcers per patient   ??? Opana [Oxymorphone] Rash and Itching   ??? Tylenol [Acetaminophen] Itching  Review of Systems:    1) See above. 2) A 12 point review of systems has been obtained. ROS is otherwise noncontributory.      Physical Exam:      Visit Vitals  BP 147/83   Pulse 74   Temp 98.2 ??F (36.8 ??C)   Resp 12   Ht '5\' 7"'$  (1.702 m)   Wt 65.3 kg (144 lb)   SpO2 100%   BMI 22.55 kg/m??         Intake/Output Summary (Last 24 hours) at 11/13/2017 2225  Last data filed at 11/13/2017 1949  Gross per 24 hour   Intake 500 ml   Output ???   Net 500 ml       Physical Exam:    HEENT: NC/AT, PERRLA, EOMI, dry mucous membranes  Neck: No JVD no bruits, supple, nontender, no significant LAD  LYMPH: No supraclavicular or cervical or axillary nodes on both sides  Cardiovascular: Heart, RRR, no M, R, G  Lungs: decreased breath sounds at bases, coarse upper airway sounds, no wheezes or crackles  Abd: Soft, non tender, not distended, No guarding, No rigidity, BS normal  NEURO:  Non focal, normal strength. CN II - XII intact bilaterally   Extrm: diffuse discomfort to lower back and left leg, no leg edema   Skin: No rashes or lesions     Labs Reviewed:     Recent Results (from the past 24 hour(s))   CBC WITH AUTOMATED DIFF    Collection Time: 11/13/17  5:30 PM   Result Value Ref Range    WBC 6.0 4.0 - 11.0 1000/mm3    RBC 3.97 3.60 - 5.20 M/uL    HGB 8.5 (L) 13.0 - 17.2 gm/dl    HCT 28.2 (L) 37.0 - 50.0 %    MCV 71.0 (L) 80.0 - 98.0 fL    MCH 21.4 (L) 25.4 - 34.6 pg     MCHC 30.1 30.0 - 36.0 gm/dl    PLATELET 334 140 - 450 1000/mm3    MPV 9.7 6.0 - 10.0 fL    RDW-SD 53.1 (H) 36.4 - 46.3      NRBC 0 0 - 0      IMMATURE GRANULOCYTES 0.2 0.0 - 3.0 %    NEUTROPHILS 58.7 34 - 64 %    LYMPHOCYTES 31.6 28 - 48 %    MONOCYTES 6.3 1 - 13 %    EOSINOPHILS 2.7 0 - 5 %    BASOPHILS 0.5 0 - 3 %   METABOLIC PANEL, COMPREHENSIVE    Collection Time: 11/13/17  5:30 PM   Result Value Ref Range    Sodium 144 136 - 145 mEq/L    Potassium 3.7 3.5 - 5.1 mEq/L    Chloride 111 (H) 98 - 107 mEq/L    CO2 28 21 - 32 mEq/L    Glucose 118 (H) 74 - 106 mg/dl    BUN 15 7 - 25 mg/dl    Creatinine 0.9 0.6 - 1.3 mg/dl    GFR est AA >60.0      GFR est non-AA >60      Calcium 8.8 8.5 - 10.1 mg/dl    AST (SGOT) 19 15 - 37 U/L    ALT (SGPT) 15 12 - 78 U/L    Alk. phosphatase 102 45 - 117 U/L    Bilirubin, total 0.2 0.2 - 1.0 mg/dl    Protein, total 7.4 6.4 - 8.2 gm/dl    Albumin 3.6 3.4 -  5.0 gm/dl    Anion gap 5 5 - 15 mmol/L   TROPONIN I    Collection Time: 11/13/17  5:30 PM   Result Value Ref Range    Troponin-I <0.015 0.000 - 0.045 ng/ml   LIPASE    Collection Time: 11/13/17  5:30 PM   Result Value Ref Range    Lipase 100 73 - 393 U/L   POC CHEM8    Collection Time: 11/13/17  6:03 PM   Result Value Ref Range    Sodium 144 136 - 145 mEq/L    Potassium 3.6 3.5 - 4.9 mEq/L    Chloride 106 98 - 107 mEq/L    CO2, TOTAL 26 21 - 32 mmol/L    Glucose 117 (H) 74 - 106 mg/dL    BUN 13 7 - 25 mg/dl    Creatinine 0.8 0.6 - 1.3 mg/dl    HCT 30 (L) 38 - 45 %    HGB 10.2 (L) 12.4 - 17.2 gm/dl    CALCIUM,IONIZED 4.60 4.40 - 5.40 mg/dL   POC URINE MACROSCOPIC    Collection Time: 11/13/17  9:15 PM   Result Value Ref Range    Glucose Negative NEGATIVE,Negative mg/dl    Bilirubin Negative NEGATIVE,Negative      Ketone Negative NEGATIVE,Negative mg/dl    Specific gravity 1.015 1.005 - 1.030      Blood Negative NEGATIVE,Negative      pH (UA) 7.0 5 - 9      Protein Negative NEGATIVE,Negative mg/dl    Urobilinogen 0.2 0.0 - 1.0 EU/dl     Nitrites Negative NEGATIVE,Negative      Leukocyte Esterase Negative NEGATIVE,Negative      Color Yellow      Appearance Clear             Diagnostic:     Xr Chest Sngl V    Result Date: 11/13/2017  EXAM:  Chest AP INDICATIONS: chest pain 2 days ago, fall today COMPARISON: 08/21/2016. FINDINGS: Hyperinflated lungs, consistent with COPD. No consolidation, pleural effusion, or pulmonary edema. No pneumothorax. Normal cardiomediastinal contours.     IMPRESSION: 1.  No acute cardiopulmonary process. 2.  COPD.     Xr Hip Lt W Or Wo Pelv 2-3 Vws    Result Date: 11/13/2017  INDICATION: injury; fall, left leg pain. TECHNIQUE: Frontal view of the pelvis and frog-leg lateral view of the left hip. COMPARISON: None. FINDINGS: Normal hip joint alignment. No fracture or avascular necrosis. Joint spaces are preserved. No significant soft tissue abnormality.     IMPRESSION: No acute abnormality of the left hip.     Xr Knee Lt Min 4 V    Result Date: 11/13/2017  INDICATION: fall, left leg pain;    TECHNIQUE: Frontal, lateral, oblique, and tunnel views of the left knee. COMPARISON: None. FINDINGS: Normal bone alignment. No fracture. No joint effusion. Joint spaces are preserved. No significant soft tissue abnormality.     IMPRESSION: No acute abnormality of the left knee.     Xr Ankle Lt Min 3 V    Result Date: 11/13/2017  INDICATION: injury; fall, left leg pain. TECHNIQUE: Frontal, lateral, and oblique views of the left ankle. COMPARISON: None. FINDINGS: Normal bone alignment. Chronic fracture deformity of the second metatarsal. No acute fracture. Joint spaces are preserved. No significant soft tissue abnormality.     IMPRESSION: No acute abnormality of the left ankle.     Ct Head Wo Cont    Result Date: 11/13/2017  INDICATION: CHI,  headache on eliquis. Fell off [place]. COMPARISON: 04/04/2017. TECHNIQUE: Axial Head CT without IV contrast. Sagittal and coronal reconstructions were performed. DICOM format imaged data is  available to non-affiliated external healthcare facilities or entities on a secure, media free, reciprocal searchable basis with patient authorization for 12 months following the date of the study. FINDINGS: Ventricles and sulci are normal in size and configuration for the patient's age. No extra axial collection. No acute intracranial hemorrhage. No mass effect or edema. There are scattered foci of white matter hypoattenuation, which are nonspecific but most consistent with microangiopathy. No CT evidence of acute large territorial infarction. Mild mucosal thickening in the ethmoid air cells. Calvarium is unremarkable. Atherosclerotic vascular calcifications in the cavernous carotid arteries.     IMPRESSION: 1.  No acute intracranial hemorrhage or mass effect. 2.  Mild chronic age-related changes.      Ct Spine Cerv Wo Cont    Result Date: 11/13/2017  CT cervical spine without contrast Indication: Recent trauma, spine; fall, midline c-spine pain    Comparison: 04/04/2017. Technique: CT images of the cervical spine were obtained without intravenous contrast administration. Coronal and sagittal reformations were provided for further characterization. DICOM format imaged data is available to non-affiliated external healthcare facilities or entities on a secure, media free, reciprocal searchable basis with patient authorization for 12 months following the date of the study. Findings: Mild degenerative retrolisthesis at C3-4. Mild degenerative anterolisthesis at C4-5 and C5-6. Vertebral body heights are maintained.  The dens is intact. No evidence of fracture. No prevertebral soft tissue edema. Multilevel, multifactorial spondylosis is present throughout the cervical spine with no high-grade osseous narrowing of the spinal canal. Uncovertebral joint hypertrophy contributes to at least moderate narrowing of the right foramen at C4-5. Thyroid gland and partially imaged lung apices are unremarkable.      Impression: 1.  No acute fracture or malalignment of the cervical spine. 2.  Moderate multilevel cervical spondylosis.     Ct Abd Pelv W Cont    Result Date: 11/13/2017  INDICATION: Fall. Left-sided abdominal pain. COMPARISON: 04/04/2017. TECHNIQUE: CT of the abdomen and pelvis WITH intravenous contrast. Coronal and sagittal reformatted images were produced. DICOM format imaged data is available to non-affiliated external healthcare facilities or entities on a secure, media free, reciprocal searchable basis with patient authorization for 12 months following the date of the study. CONTRAST: 80 mL Isovue-370. COMMENTS: LOWER THORAX: Dependent changes in the lungs. HEPATOBILIARY: No focal hepatic lesions. Cholecystectomy. Marked diffuse intrahepatic and extra hepatic biliary duct dilatation. The common bile duct measures up to 2.9 cm. This is not significantly changed since the prior study. No calcified biliary duct stones. SPLEEN: No splenomegaly. PANCREAS: No focal masses or ductal dilatation. ADRENALS: No adrenal nodules. KIDNEYS/URETERS: Subcentimeter hypoattenuating lesion on the upper pole of the right kidney, too small to characterize. No hydronephrosis, stones, or solid mass lesions. PELVIC ORGANS/BLADDER: Hysterectomy. No adnexal mass. The bladder is distended, but otherwise unremarkable. PERITONEUM / RETROPERITONEUM: No free air or fluid. LYMPH NODES: No enlarged mesenteric, retroperitoneal, or pelvic lymph nodes. VESSELS: Atherosclerotic vascular calcifications. 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 No obstruction. No wall thickening. Large amount of stool in the colon. BONES AND SOFT TISSUES: Mild multilevel degenerative changes of the spine. The soft tissues are unremarkable.     IMPRESSION: 1.  Marked biliary duct dilatation, unchanged. Cholecystectomy. 2.  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 No obstruction. 3.  Large amount of stool in the colon, suggesting constipation.  Dragon medical dictation software was used for portions of this report.  Efforts have been made to edit the dictations, but occasionally words are mis-transcribed.    Knox Saliva, MD  11/13/2017  10:25 PM

## 2017-11-13 NOTE — H&P (Signed)
Chart reviewed.  Patient seen and examined on 11/13/2017.  Complete H&P to follow.

## 2017-11-13 NOTE — ED Notes (Signed)
Per medic 1, pt fell off of her porch which is approx 522ft tall. Now with left sided shoulder and leg pain. Pt has been dizzy for a few days now.

## 2017-11-13 NOTE — ED Notes (Signed)
Exam done with PA, pt cathed and urine tested, denies any needs at this time, family at bedside

## 2017-11-13 NOTE — Other (Signed)
TRANSFER - IN REPORT:    Verbal report received from [place]ait[place], RN (name) on [name] [name]  being received from ED (unit) for routine progression of care      Report consisted of patient???s Situation, Background, Assessment and   Recommendations(SBAR).     Information from the following report(s) SBAR was reviewed with the receiving nurse.    Opportunity for questions and clarification was provided.      Assessment completed upon patient???s arrival to unit and care assumed.

## 2017-11-14 LAB — METABOLIC PANEL, COMPREHENSIVE
ALT (SGPT): 14 U/L (ref 12–78)
AST (SGOT): 21 U/L (ref 15–37)
Albumin: 3.7 gm/dl (ref 3.4–5.0)
Alk. phosphatase: 103 U/L (ref 45–117)
Anion gap: 10 mmol/L (ref 5–15)
BUN: 10 mg/dl (ref 7–25)
Bilirubin, total: 0.3 mg/dl (ref 0.2–1.0)
CO2: 26 mEq/L (ref 21–32)
Calcium: 8.3 mg/dl — ABNORMAL LOW (ref 8.5–10.1)
Chloride: 108 mEq/L — ABNORMAL HIGH (ref 98–107)
Creatinine: 0.7 mg/dl (ref 0.6–1.3)
GFR est AA: >60
GFR est non-AA: >60
Glucose: 77 mg/dl (ref 74–106)
Potassium: 4.3 mEq/L (ref 3.5–5.1)
Protein, total: 6.9 gm/dl (ref 6.4–8.2)
Sodium: 143 mEq/L (ref 136–145)

## 2017-11-14 LAB — CBC WITH AUTOMATED DIFF
BASOPHILS: 0.7 % (ref 0–3)
EOSINOPHILS: 3.8 % (ref 0–5)
HCT: 28.6 % — ABNORMAL LOW (ref 37.0–50.0)
HGB: 8.4 gm/dl — ABNORMAL LOW (ref 13.0–17.2)
IMMATURE GRANULOCYTES: 0.2 % (ref 0.0–3.0)
LYMPHOCYTES: 27.5 % — ABNORMAL LOW (ref 28–48)
MCH: 21.2 pg — ABNORMAL LOW (ref 25.4–34.6)
MCHC: 29.4 gm/dl — ABNORMAL LOW (ref 30.0–36.0)
MCV: 72 fL — ABNORMAL LOW (ref 80.0–98.0)
MONOCYTES: 7.4 % (ref 1–13)
MPV: 10.2 fL — ABNORMAL HIGH (ref 6.0–10.0)
NEUTROPHILS: 60.4 % (ref 34–64)
NRBC: 0 (ref 0–0)
PLATELET: 311 10*3/uL (ref 140–450)
RBC: 3.97 M/uL (ref 3.60–5.20)
RDW-SD: 54.3 — ABNORMAL HIGH (ref 36.4–46.3)
WBC: 5.5 10*3/uL (ref 4.0–11.0)

## 2017-11-14 LAB — POC URINE MACROSCOPIC
Bilirubin: NEGATIVE
Blood: NEGATIVE
Glucose: NEGATIVE mg/dl
Ketone: NEGATIVE mg/dl
Leukocyte Esterase: NEGATIVE
Nitrites: NEGATIVE
Protein: NEGATIVE mg/dl
Specific gravity: 1.015 (ref 1.005–1.030)
Urobilinogen: 0.2 EU/dl (ref 0.0–1.0)
pH (UA): 7 (ref 5–9)

## 2017-11-14 LAB — EKG, 12 LEAD, INITIAL
Atrial Rate: 85 {beats}/min
Calculated P Axis: 67 degrees
Calculated R Axis: 39 degrees
Calculated T Axis: 53 degrees
Diagnosis: NORMAL
P-R Interval: 162 ms
Q-T Interval: 352 ms
QRS Duration: 92 ms
QTC Calculation (Bezet): 418 ms
Ventricular Rate: 85 {beats}/min

## 2017-11-14 LAB — TROPONIN I
Troponin-I: <0.015 ng/ml (ref 0.000–0.045)
Troponin-I: <0.015 ng/ml (ref 0.000–0.045)
Troponin-I: <0.015 ng/ml (ref 0.00–0.09)

## 2017-11-14 LAB — LIPID PANEL
CHOL/HDL Ratio: 2.2 Ratio (ref 0.0–4.4)
Cholesterol, total: 148 mg/dl (ref 140–199)
HDL Cholesterol: 67 mg/dl (ref 40–96)
LDL, calculated: 73 mg/dl (ref 0–130)
Triglyceride: 41 mg/dl (ref 29–150)

## 2017-11-14 LAB — PROTHROMBIN TIME + INR
INR: 1.1 (ref 0.1–1.1)
Prothrombin time: 12.2 seconds (ref 10.2–12.9)

## 2017-11-14 LAB — T4, FREE: Free T4: 0.95 ng/dl (ref 0.76–1.46)

## 2017-11-14 LAB — MAGNESIUM: Magnesium: 2.3 mg/dl (ref 1.6–2.6)

## 2017-11-14 LAB — TSH 3RD GENERATION: TSH: 3.35 u[IU]/mL (ref 0.358–3.740)

## 2017-11-14 LAB — EKG 12-LEAD
Atrial Rate: 85 {beats}/min
Diagnosis: NORMAL
P Axis: 67 degrees
P-R Interval: 162 ms
Q-T Interval: 352 ms
QRS Duration: 92 ms
QTc Calculation (Bazett): 418 ms
R Axis: 39 degrees
T Axis: 53 degrees
Ventricular Rate: 85 {beats}/min

## 2017-11-14 MED ORDER — CYCLOBENZAPRINE 10 MG TAB
10 mg | Freq: Three times a day (TID) | ORAL | Status: DC | PRN
Start: 2017-11-14 — End: 2017-11-16
  Administered 2017-11-14 – 2017-11-15 (×2): via ORAL

## 2017-11-14 MED ORDER — APIXABAN 2.5 MG TABLET
2.5 mg | Freq: Two times a day (BID) | ORAL | Status: DC
Start: 2017-11-14 — End: 2017-11-14
  Administered 2017-11-14: 14:00:00 via ORAL

## 2017-11-14 MED ORDER — OMEPRAZOLE 20 MG CAP, DELAYED RELEASE
20 mg | Freq: Every day | ORAL | Status: DC
Start: 2017-11-14 — End: 2017-11-15
  Administered 2017-11-14 – 2017-11-15 (×2): via ORAL

## 2017-11-14 MED ORDER — PANTOPRAZOLE 40 MG TAB, DELAYED RELEASE
40 mg | Freq: Two times a day (BID) | ORAL | Status: DC
Start: 2017-11-14 — End: 2017-11-15
  Administered 2017-11-14 – 2017-11-15 (×3): via ORAL

## 2017-11-14 MED ORDER — LAMOTRIGINE 25 MG TAB
25 mg | Freq: Two times a day (BID) | ORAL | Status: DC
Start: 2017-11-14 — End: 2017-11-16
  Administered 2017-11-14 – 2017-11-16 (×5): via ORAL

## 2017-11-14 MED ORDER — HEPARIN (PORCINE) 5,000 UNIT/ML IJ SOLN
5000 unit/mL | Freq: Two times a day (BID) | INTRAMUSCULAR | Status: DC
Start: 2017-11-14 — End: 2017-11-15
  Administered 2017-11-14 – 2017-11-15 (×2): via SUBCUTANEOUS

## 2017-11-14 MED ORDER — FLU VACCINE QV 2018-19 (36 MOS+)(PF) 60 MCG (15 MCG X 4)/0.5 ML IM SUSP
60 mcg (15 mcg x 4)/0.5 mL | INTRAMUSCULAR | Status: DC
Start: 2017-11-14 — End: 2017-11-16

## 2017-11-14 MED ORDER — NALOXONE 0.4 MG/ML INJECTION
0.4 mg/mL | INTRAMUSCULAR | Status: DC | PRN
Start: 2017-11-14 — End: 2017-11-16

## 2017-11-14 MED ORDER — AMITRIPTYLINE 25 MG TAB
25 mg | Freq: Every evening | ORAL | Status: DC
Start: 2017-11-14 — End: 2017-11-15
  Administered 2017-11-15: 01:00:00 via ORAL

## 2017-11-14 MED ORDER — SODIUM CHLORIDE 0.9 % IJ SYRG
Freq: Three times a day (TID) | INTRAMUSCULAR | Status: DC
Start: 2017-11-14 — End: 2017-11-16
  Administered 2017-11-14 – 2017-11-16 (×8): via INTRAVENOUS

## 2017-11-14 MED ORDER — PREGABALIN 25 MG CAP
25 mg | Freq: Two times a day (BID) | ORAL | Status: DC
Start: 2017-11-14 — End: 2017-11-15
  Administered 2017-11-14 – 2017-11-15 (×2): via ORAL

## 2017-11-14 MED ORDER — OXYCODONE 5 MG TAB
5 mg | ORAL | Status: DC | PRN
Start: 2017-11-14 — End: 2017-11-16
  Administered 2017-11-14 – 2017-11-16 (×6): via ORAL

## 2017-11-14 MED ORDER — SUCRALFATE 100 MG/ML ORAL SUSP
100 mg/mL | Freq: Two times a day (BID) | ORAL | Status: DC
Start: 2017-11-14 — End: 2017-11-14
  Administered 2017-11-14: 14:00:00 via ORAL

## 2017-11-14 MED ORDER — LIDOCAINE 5 % (700 MG/PATCH) ADHESIVE PATCH
5 % | CUTANEOUS | Status: DC
Start: 2017-11-14 — End: 2017-11-16

## 2017-11-14 MED ORDER — SIMVASTATIN 40 MG TAB
40 mg | Freq: Every evening | ORAL | Status: DC
Start: 2017-11-14 — End: 2017-11-16
  Administered 2017-11-15 – 2017-11-16 (×2): via ORAL

## 2017-11-14 MED ORDER — SODIUM CHLORIDE 0.9 % IJ SYRG
INTRAMUSCULAR | Status: DC | PRN
Start: 2017-11-14 — End: 2017-11-16

## 2017-11-14 MED ORDER — MORPHINE 4 MG/ML SYRINGE
4 mg/mL | INTRAMUSCULAR | Status: AC
Start: 2017-11-14 — End: 2017-11-13
  Administered 2017-11-14: via INTRAVENOUS

## 2017-11-14 MED ORDER — ASCORBIC ACID 500 MG TAB
500 mg | Freq: Two times a day (BID) | ORAL | Status: DC
Start: 2017-11-14 — End: 2017-11-16
  Administered 2017-11-14 – 2017-11-16 (×5): via ORAL

## 2017-11-14 MED ORDER — SODIUM CHLORIDE 0.9 % IV
INTRAVENOUS | Status: DC
Start: 2017-11-14 — End: 2017-11-15
  Administered 2017-11-14: 10:00:00 via INTRAVENOUS

## 2017-11-14 MED ORDER — GABAPENTIN 100 MG CAP
100 mg | Freq: Three times a day (TID) | ORAL | Status: DC
Start: 2017-11-14 — End: 2017-11-14

## 2017-11-14 MED FILL — CYCLOBENZAPRINE 10 MG TAB: 10 mg | ORAL | Qty: 1

## 2017-11-14 MED FILL — BD POSIFLUSH NORMAL SALINE 0.9 % INJECTION SYRINGE: INTRAMUSCULAR | Qty: 10

## 2017-11-14 MED FILL — MORPHINE 4 MG/ML SYRINGE: 4 mg/mL | INTRAMUSCULAR | Qty: 1

## 2017-11-14 MED FILL — LIDOCAINE 5 % (700 MG/PATCH) ADHESIVE PATCH: 5 % | CUTANEOUS | Qty: 2

## 2017-11-14 MED FILL — OXYCODONE 5 MG TAB: 5 mg | ORAL | Qty: 1

## 2017-11-14 MED FILL — SUCRALFATE 100 MG/ML ORAL SUSP: 100 mg/mL | ORAL | Qty: 10

## 2017-11-14 MED FILL — OMEPRAZOLE 20 MG CAP, DELAYED RELEASE: 20 mg | ORAL | Qty: 1

## 2017-11-14 MED FILL — HEPARIN (PORCINE) 5,000 UNIT/ML IJ SOLN: 5000 unit/mL | INTRAMUSCULAR | Qty: 1

## 2017-11-14 MED FILL — LYRICA 25 MG CAPSULE: 25 mg | ORAL | Qty: 1

## 2017-11-14 MED FILL — VITAMIN C 500 MG TABLET: 500 mg | ORAL | Qty: 1

## 2017-11-14 MED FILL — LAMOTRIGINE 25 MG TAB: 25 mg | ORAL | Qty: 4

## 2017-11-14 MED FILL — PANTOPRAZOLE 40 MG TAB, DELAYED RELEASE: 40 mg | ORAL | Qty: 1

## 2017-11-14 MED FILL — ELIQUIS 2.5 MG TABLET: 2.5 mg | ORAL | Qty: 1

## 2017-11-14 MED FILL — SODIUM CHLORIDE 0.9 % IV: INTRAVENOUS | Qty: 1000

## 2017-11-14 NOTE — Progress Notes (Signed)
Problem: Falls - Risk of  Goal: *Absence of Falls  Document Schmid Fall Risk and appropriate interventions in the flowsheet.  Outcome: Progressing Towards Goal  Fall Risk Interventions:  Mobility Interventions: Assess mobility with egress test, Bed/chair exit alarm, OT consult for ADLs, Patient to call before getting OOB, PT Consult for mobility concerns         Medication Interventions: Bed/chair exit alarm, Patient to call before getting OOB, Teach patient to arise slowly    Elimination Interventions: Bed/chair exit alarm, Call light in reach, Patient to call for help with toileting needs    History of Falls Interventions: Bed/chair exit alarm, Door open when patient unattended, Room close to nurse's station

## 2017-11-14 NOTE — Progress Notes (Signed)
Hospitalist Progress Note    Patient: [name] [name]               Sex: female          DOA: 11/13/2017       Date of Birth:  1950/10/15      Age:  67 y.o.        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  PLAN:   Monitor on tele.  Echocardiogram.  Carotid PVL.  Fall precautions.  Troponin negative X3   repeat CT head without contrast in a.m, resume eliquis tomorrow  Heparin for DVT ppx.  Patient has excruciating pain of the left lower extremity, left ankle, left hip x-ray, left knee x-ray negative for fracture.  Lumbar spine x-ray shows chronic changes.  Symptomatic treatment for arthralgias and myalgias. Added heating pad, continue Oxycodone, resume Lyrica.    Recommend to continue hospitalization.  Expected date of discharge: TBD  Plan for disposition - TBD       Subjective: back pain , left leg pain     [name] [name] is a 67 y.o. year old female who is being seen for  Fall. Leg pain.    Objective: resting in bed      Vital Signs:  Visit Vitals  BP 123/73 (BP 1 Location: Left arm, BP Patient Position: Supine)   Pulse 83   Temp 97.9 ??F (36.6 ??C)   Resp 20   Ht '5\' 7"'$  (1.702 m)   Wt 65.3 kg (144 lb)   SpO2 98%   Breastfeeding? No   BMI 22.55 kg/m??     Physical Exam:  GENERAL: Alert and oriented times 3.  HEAD: Head normocephalic, atraumatic   Eyes: Conjunctivae pink, anicteric  CARDIOVASCULAR: S1 and S2 heard, no murmur  RESPIRATORY: Effort normal. no crackles.   ABDOMEN: Soft, nontender, nondistended. Normal bowel present.   NEUROLOGIC: Alert and oriented times 3. Limited on LLE due to pain  SKIN: Warm and dry.  PSYCHIATRIC: Normal affect.    Intake and Output:  Last three shifts:  03/14 1901 - 03/16 0700  In: 527.5 [I.V.:527.5]  Out: -      Lab Results:  Recent Results (from the past 24 hour(s))   CBC WITH AUTOMATED DIFF    Collection Time: 11/13/17  5:30 PM   Result Value Ref Range    WBC 6.0 4.0 - 11.0 1000/mm3    RBC 3.97 3.60 - 5.20 M/uL    HGB 8.5 (L) 13.0 - 17.2 gm/dl    HCT 28.2 (L) 37.0 - 50.0 %    MCV 71.0 (L) 80.0 - 98.0 fL    MCH 21.4 (L) 25.4 - 34.6 pg    MCHC 30.1 30.0 - 36.0 gm/dl    PLATELET 334 140 - 450 1000/mm3    MPV 9.7 6.0 - 10.0 fL    RDW-SD 53.1 (H) 36.4 - 46.3      NRBC 0 0 - 0      IMMATURE GRANULOCYTES 0.2 0.0 - 3.0 %    NEUTROPHILS 58.7 34 - 64 %  LYMPHOCYTES 31.6 28 - 48 %    MONOCYTES 6.3 1 - 13 %    EOSINOPHILS 2.7 0 - 5 %    BASOPHILS 0.5 0 - 3 %   METABOLIC PANEL, COMPREHENSIVE    Collection Time: 11/13/17  5:30 PM   Result Value Ref Range    Sodium 144 136 - 145 mEq/L    Potassium 3.7 3.5 - 5.1 mEq/L    Chloride 111 (H) 98 - 107 mEq/L    CO2 28 21 - 32 mEq/L    Glucose 118 (H) 74 - 106 mg/dl    BUN 15 7 - 25 mg/dl    Creatinine 0.9 0.6 - 1.3 mg/dl    GFR est AA >60.0      GFR est non-AA >60      Calcium 8.8 8.5 - 10.1 mg/dl    AST (SGOT) 19 15 - 37 U/L    ALT (SGPT) 15 12 - 78 U/L    Alk. phosphatase 102 45 - 117 U/L    Bilirubin, total 0.2 0.2 - 1.0 mg/dl    Protein, total 7.4 6.4 - 8.2 gm/dl    Albumin 3.6 3.4 - 5.0 gm/dl    Anion gap 5 5 - 15 mmol/L   TROPONIN I    Collection Time: 11/13/17  5:30 PM   Result Value Ref Range    Troponin-I <0.015 0.000 - 0.045 ng/ml   LIPASE    Collection Time: 11/13/17  5:30 PM   Result Value Ref Range    Lipase 100 73 - 393 U/L   POC CHEM8    Collection Time: 11/13/17  6:03 PM   Result Value Ref Range    Sodium 144 136 - 145 mEq/L    Potassium 3.6 3.5 - 4.9 mEq/L    Chloride 106 98 - 107 mEq/L    CO2, TOTAL 26 21 - 32 mmol/L    Glucose 117 (H) 74 - 106 mg/dL    BUN 13 7 - 25 mg/dl    Creatinine 0.8 0.6 - 1.3 mg/dl    HCT 30 (L) 38 - 45 %    HGB 10.2 (L) 12.4 - 17.2 gm/dl    CALCIUM,IONIZED 4.60 4.40 - 5.40 mg/dL   EKG, 12 LEAD, INITIAL    Collection Time: 11/13/17  6:05 PM    Result Value Ref Range    Ventricular Rate 85 BPM    Atrial Rate 85 BPM    P-R Interval 162 ms    QRS Duration 92 ms    Q-T Interval 352 ms    QTC Calculation (Bezet) 418 ms    Calculated P Axis 67 degrees    Calculated R Axis 39 degrees    Calculated T Axis 53 degrees    Diagnosis       Normal sinus rhythm  Possible Left atrial enlargement  Nonspecific T wave abnormality  When compared with ECG of 18-Feb-2017 19:47,  No significant change was found  Confirmed by Earley Brooke, M.D., [place] ([id number]) on 11/14/2017 7:54:05 AM     POC URINE MACROSCOPIC    Collection Time: 11/13/17  9:15 PM   Result Value Ref Range    Glucose Negative NEGATIVE,Negative mg/dl    Bilirubin Negative NEGATIVE,Negative      Ketone Negative NEGATIVE,Negative mg/dl    Specific gravity 1.015 1.005 - 1.030      Blood Negative NEGATIVE,Negative      pH (UA) 7.0 5 - 9      Protein Negative NEGATIVE,Negative mg/dl  Urobilinogen 0.2 0.0 - 1.0 EU/dl    Nitrites Negative NEGATIVE,Negative      Leukocyte Esterase Negative NEGATIVE,Negative      Color Yellow      Appearance Clear     TROPONIN I    Collection Time: 11/14/17 12:28 AM   Result Value Ref Range    Troponin-I <0.015 0.000 - 0.045 ng/ml   TROPONIN I    Collection Time: 11/14/17  2:39 AM   Result Value Ref Range    Troponin-I <0.015 0.000 - 0.045 ng/ml   TROPONIN I    Collection Time: 11/14/17  6:37 AM   Result Value Ref Range    Troponin-I <0.015 0.00 - 8.09 ng/ml   METABOLIC PANEL, COMPREHENSIVE    Collection Time: 11/14/17  6:37 AM   Result Value Ref Range    Sodium 143 136 - 145 mEq/L    Potassium 4.3 3.5 - 5.1 mEq/L    Chloride 108 (H) 98 - 107 mEq/L    CO2 26 21 - 32 mEq/L    Glucose 77 74 - 106 mg/dl    BUN 10 7 - 25 mg/dl    Creatinine 0.7 0.6 - 1.3 mg/dl    GFR est AA >60.0      GFR est non-AA >60      Calcium 8.3 (L) 8.5 - 10.1 mg/dl    AST (SGOT) 21 15 - 37 U/L    ALT (SGPT) 14 12 - 78 U/L    Alk. phosphatase 103 45 - 117 U/L    Bilirubin, total 0.3 0.2 - 1.0 mg/dl     Protein, total 6.9 6.4 - 8.2 gm/dl    Albumin 3.7 3.4 - 5.0 gm/dl    Anion gap 10 5 - 15 mmol/L   LIPID PANEL    Collection Time: 11/14/17  6:37 AM   Result Value Ref Range    Cholesterol, total 148 140 - 199 mg/dl    HDL Cholesterol 67 40 - 96 mg/dl    Triglyceride 41 29 - 150 mg/dl    LDL, calculated 73 0 - 130 mg/dl    CHOL/HDL Ratio 2.2 0.0 - 4.4 Ratio   TSH 3RD GENERATION    Collection Time: 11/14/17  6:37 AM   Result Value Ref Range    TSH 3.350 0.358 - 3.740 uIU/mL   T4, FREE    Collection Time: 11/14/17  6:37 AM   Result Value Ref Range    Free T4 0.95 0.76 - 1.46 ng/dl   MAGNESIUM    Collection Time: 11/14/17  6:37 AM   Result Value Ref Range    Magnesium 2.3 1.6 - 2.6 mg/dl   CBC WITH AUTOMATED DIFF    Collection Time: 11/14/17  9:50 AM   Result Value Ref Range    WBC 5.5 4.0 - 11.0 1000/mm3    RBC 3.97 3.60 - 5.20 M/uL    HGB 8.4 (L) 13.0 - 17.2 gm/dl    HCT 28.6 (L) 37.0 - 50.0 %    MCV 72.0 (L) 80.0 - 98.0 fL    MCH 21.2 (L) 25.4 - 34.6 pg    MCHC 29.4 (L) 30.0 - 36.0 gm/dl    PLATELET 311 140 - 450 1000/mm3    MPV 10.2 (H) 6.0 - 10.0 fL    RDW-SD 54.3 (H) 36.4 - 46.3      NRBC 0 0 - 0      IMMATURE GRANULOCYTES 0.2 0.0 - 3.0 %    NEUTROPHILS 60.4 34 -  64 %    LYMPHOCYTES 27.5 (L) 28 - 48 %    MONOCYTES 7.4 1 - 13 %    EOSINOPHILS 3.8 0 - 5 %    BASOPHILS 0.7 0 - 3 %   PROTHROMBIN TIME + INR    Collection Time: 11/14/17  9:50 AM   Result Value Ref Range    Prothrombin time 12.2 10.2 - 12.9 seconds    INR 1.1 0.1 - 1.1         Images:  Xr Chest Sngl V    Result Date: 11/13/2017  EXAM:  Chest AP INDICATIONS: chest pain 2 days ago, fall today COMPARISON: 08/21/2016. FINDINGS: Hyperinflated lungs, consistent with COPD. No consolidation, pleural effusion, or pulmonary edema. No pneumothorax. Normal cardiomediastinal contours.     IMPRESSION: 1.  No acute cardiopulmonary process. 2.  COPD.     Xr Spine Lumb 2 Or 3 V    Result Date: 11/13/2017   INDICATION: injury; fell off porch. TECHNIQUE: Frontal and lateral views of the lumbar spine and a coned-down lateral view of the lumbosacral junction. COMPARISON: None. FINDINGS: Mild levoscoliosis. No spondylolisthesis. Vertebral body heights are maintained. No fracture. Multilevel facet arthropathy, worst at L4-5 and L5-S1. Mild disc height loss at L4-5.     IMPRESSION: 1.  No acute abnormality of the lumbar spine. 2.  Mild multilevel spondylosis, worst at L4-5.     Xr Hip Lt W Or Wo Pelv 2-3 Vws    Result Date: 11/13/2017  INDICATION: injury; fall, left leg pain. TECHNIQUE: Frontal view of the pelvis and frog-leg lateral view of the left hip. COMPARISON: None. FINDINGS: Normal hip joint alignment. No fracture or avascular necrosis. Joint spaces are preserved. No significant soft tissue abnormality.     IMPRESSION: No acute abnormality of the left hip.     Xr Knee Lt Min 4 V    Result Date: 11/13/2017  INDICATION: fall, left leg pain;    TECHNIQUE: Frontal, lateral, oblique, and tunnel views of the left knee. COMPARISON: None. FINDINGS: Normal bone alignment. No fracture. No joint effusion. Joint spaces are preserved. No significant soft tissue abnormality.     IMPRESSION: No acute abnormality of the left knee.     Xr Ankle Lt Min 3 V    Result Date: 11/13/2017  INDICATION: injury; fall, left leg pain. TECHNIQUE: Frontal, lateral, and oblique views of the left ankle. COMPARISON: None. FINDINGS: Normal bone alignment. Chronic fracture deformity of the second metatarsal. No acute fracture. Joint spaces are preserved. No significant soft tissue abnormality.     IMPRESSION: No acute abnormality of the left ankle.     Ct Head Wo Cont    Result Date: 11/13/2017  INDICATION: CHI, headache on eliquis. Fell off [place]. COMPARISON: 04/04/2017. TECHNIQUE: Axial Head CT without IV contrast. Sagittal and coronal reconstructions were performed. DICOM format imaged data is  available to non-affiliated external healthcare facilities or entities on a secure, media free, reciprocal searchable basis with patient authorization for 12 months following the date of the study. FINDINGS: Ventricles and sulci are normal in size and configuration for the patient's age. No extra axial collection. No acute intracranial hemorrhage. No mass effect or edema. There are scattered foci of white matter hypoattenuation, which are nonspecific but most consistent with microangiopathy. No CT evidence of acute large territorial infarction. Mild mucosal thickening in the ethmoid air cells. Calvarium is unremarkable. Atherosclerotic vascular calcifications in the cavernous carotid arteries.     IMPRESSION: 1.  No acute intracranial  hemorrhage or mass effect. 2.  Mild chronic age-related changes.      Ct Spine Cerv Wo Cont    Result Date: 11/13/2017  CT cervical spine without contrast Indication: Recent trauma, spine; fall, midline c-spine pain    Comparison: 04/04/2017. Technique: CT images of the cervical spine were obtained without intravenous contrast administration. Coronal and sagittal reformations were provided for further characterization. DICOM format imaged data is available to non-affiliated external healthcare facilities or entities on a secure, media free, reciprocal searchable basis with patient authorization for 12 months following the date of the study. Findings: Mild degenerative retrolisthesis at C3-4. Mild degenerative anterolisthesis at C4-5 and C5-6. Vertebral body heights are maintained.  The dens is intact. No evidence of fracture. No prevertebral soft tissue edema. Multilevel, multifactorial spondylosis is present throughout the cervical spine with no high-grade osseous narrowing of the spinal canal. Uncovertebral joint hypertrophy contributes to at least moderate narrowing of the right foramen at C4-5. Thyroid gland and partially imaged lung apices are unremarkable.      Impression: 1.  No acute fracture or malalignment of the cervical spine. 2.  Moderate multilevel cervical spondylosis.     Ct Abd Pelv W Cont    Result Date: 11/13/2017  INDICATION: Fall. Left-sided abdominal pain. COMPARISON: 04/04/2017. TECHNIQUE: CT of the abdomen and pelvis WITH intravenous contrast. Coronal and sagittal reformatted images were produced. DICOM format imaged data is available to non-affiliated external healthcare facilities or entities on a secure, media free, reciprocal searchable basis with patient authorization for 12 months following the date of the study. CONTRAST: 80 mL Isovue-370. COMMENTS: LOWER THORAX: Dependent changes in the lungs. HEPATOBILIARY: No focal hepatic lesions. Cholecystectomy. Marked diffuse intrahepatic and extra hepatic biliary duct dilatation. The common bile duct measures up to 2.9 cm. This is not significantly changed since the prior study. No calcified biliary duct stones. SPLEEN: No splenomegaly. PANCREAS: No focal masses or ductal dilatation. ADRENALS: No adrenal nodules. KIDNEYS/URETERS: Subcentimeter hypoattenuating lesion on the upper pole of the right kidney, too small to characterize. No hydronephrosis, stones, or solid mass lesions. PELVIC ORGANS/BLADDER: Hysterectomy. No adnexal mass. The bladder is distended, but otherwise unremarkable. PERITONEUM / RETROPERITONEUM: No free air or fluid. LYMPH NODES: No enlarged mesenteric, retroperitoneal, or pelvic lymph nodes. VESSELS: Atherosclerotic vascular calcifications. 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 No obstruction. No wall thickening. Large amount of stool in the colon. BONES AND SOFT TISSUES: Mild multilevel degenerative changes of the spine. The soft tissues are unremarkable.     IMPRESSION: 1.  Marked biliary duct dilatation, unchanged. Cholecystectomy. 2.  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 No obstruction. 3.  Large amount of stool in the colon, suggesting constipation.         Medications:  Current Facility-Administered Medications   Medication Dose Route Frequency   ??? ascorbic acid (vitamin C) (VITAMIN C) tablet 250 mg  250 mg Oral BID   ??? lamoTRIgine (LaMICtal) tablet 100 mg  100 mg Oral BID   ??? omeprazole (PRILOSEC) capsule 20 mg  20 mg Oral DAILY   ??? simvastatin (ZOCOR) tablet 80 mg  80 mg Oral QHS   ??? sodium chloride (NS) flush 5-10 mL  5-10 mL IntraVENous Q8H   ??? sodium chloride (NS) flush 5-10 mL  5-10 mL IntraVENous PRN   ??? naloxone (NARCAN) injection 0.1 mg  0.1 mg IntraVENous PRN   ??? 0.9% sodium chloride infusion  50 mL/hr IntraVENous CONTINUOUS   ??? oxyCODONE IR (ROXICODONE) tablet 5 mg  5  mg Oral Q4H PRN   ??? cyclobenzaprine (FLEXERIL) tablet 10 mg  10 mg Oral TID PRN   ??? heparin (porcine) injection 5,000 Units  5,000 Units SubCUTAneous Q12H   ??? amitriptyline (ELAVIL) tablet 50 mg  50 mg Oral QHS   ??? pantoprazole (PROTONIX) tablet 40 mg  40 mg Oral BID   ??? pregabalin (LYRICA) capsule 25 mg  25 mg Oral BID   ??? influenza vaccine 2018-19 (36 mos+)(PF) (FLUZONE QUAD) injection 0.5 mL  0.5 mL IntraMUSCular PRIOR TO DISCHARGE     [place], MD  November 14, 2017  11:24 AM

## 2017-11-14 NOTE — Progress Notes (Signed)
Problem: Falls - Risk of  Goal: *Absence of Falls  Document Schmid Fall Risk and appropriate interventions in the flowsheet.  Outcome: Progressing Towards Goal  Fall Risk Interventions:  Mobility Interventions: Bed/chair exit alarm, Patient to call before getting OOB         Medication Interventions: Bed/chair exit alarm, Patient to call before getting OOB, Teach patient to arise slowly         History of Falls Interventions: Bed/chair exit alarm, Door open when patient unattended, Room close to nurse's station, Investigate reason for fall

## 2017-11-14 NOTE — Other (Signed)
Bedside shift change report given to Jincy S James, RN (oncoming nurse) by Brianna, RN (offgoing nurse). Report included the following information SBAR.

## 2017-11-14 NOTE — Progress Notes (Signed)
PAGER ID: [phone]   MESSAGE: [phone] [name] [name], [name] [name]- Pt is having hand tremors and says that she normally takes flexeril 10mg  three times a day at home. Med not currently ordered. Also, can I order a heating pad for her? Thanks Bank of AmericaBrianna [phone]

## 2017-11-14 NOTE — Progress Notes (Addendum)
0100:  On admission, patient reporting necklace missing in the ED.  Necklace returned from ED at this time in blue emesis bag.  Item given to patient.     Patient requesting pain med, no prn. Paged hospitalist via [employer].    0622:  Repaged at this time.      [id number]:  Spoke with Dr. Langston MaskerMorris.  Ordered Roxicodone IR 5mg  PO q4 prn.  Will place orders.

## 2017-11-14 NOTE — Other (Signed)
Bedside and Verbal shift change report given to Trellis MomentBrianna Smart, RN  (oncoming nurse) by Maralyn SagoSarah, RN (offgoing nurse). Report included the following information SBAR, Kardex and Cardiac Rhythm NSR.

## 2017-11-15 ENCOUNTER — Observation Stay: Admit: 2017-11-15 | Payer: PRIVATE HEALTH INSURANCE | Primary: Internal Medicine

## 2017-11-15 ENCOUNTER — Observation Stay: Payer: PRIVATE HEALTH INSURANCE | Primary: Internal Medicine

## 2017-11-15 LAB — METABOLIC PANEL, BASIC
Anion gap: 5 mmol/L (ref 5–15)
BUN: 10 mg/dl (ref 7–25)
CO2: 29 mEq/L (ref 21–32)
Calcium: 8.9 mg/dl (ref 8.5–10.1)
Chloride: 108 mEq/L — ABNORMAL HIGH (ref 98–107)
Creatinine: 0.8 mg/dl (ref 0.6–1.3)
GFR est AA: >60
GFR est non-AA: >60
Glucose: 107 mg/dl — ABNORMAL HIGH (ref 74–106)
Potassium: 4.1 mEq/L (ref 3.5–5.1)
Sodium: 142 mEq/L (ref 136–145)

## 2017-11-15 LAB — CBC WITH AUTOMATED DIFF
BASOPHILS: 0.3 % (ref 0–3)
EOSINOPHILS: 1.6 % (ref 0–5)
HCT: 28.9 % — ABNORMAL LOW (ref 37.0–50.0)
HGB: 8.5 gm/dl — ABNORMAL LOW (ref 13.0–17.2)
IMMATURE GRANULOCYTES: 0.2 % (ref 0.0–3.0)
LYMPHOCYTES: 25 % — ABNORMAL LOW (ref 28–48)
MCH: 21.2 pg — ABNORMAL LOW (ref 25.4–34.6)
MCHC: 29.4 gm/dl — ABNORMAL LOW (ref 30.0–36.0)
MCV: 72.1 fL — ABNORMAL LOW (ref 80.0–98.0)
MONOCYTES: 4.7 % (ref 1–13)
MPV: 10 fL (ref 6.0–10.0)
NEUTROPHILS: 68.2 % — ABNORMAL HIGH (ref 34–64)
NRBC: 0 (ref 0–0)
PLATELET: 321 10*3/uL (ref 140–450)
RBC: 4.01 M/uL (ref 3.60–5.20)
RDW-SD: 54.4 — ABNORMAL HIGH (ref 36.4–46.3)
WBC: 6.4 10*3/uL (ref 4.0–11.0)

## 2017-11-15 LAB — D DIMER: D DIMER: 1.37 ug/mL (FEU) — ABNORMAL HIGH (ref 0.01–0.50)

## 2017-11-15 LAB — HEMOGLOBIN A1C W/O EAG: Hemoglobin A1c: 6.4 % — ABNORMAL HIGH (ref 4.2–6.3)

## 2017-11-15 LAB — D-DIMER, QUANTITATIVE: D-Dimer, Quant: 1.37 ug/mL (FEU) — ABNORMAL HIGH (ref 0.01–0.50)

## 2017-11-15 MED ORDER — SODIUM CHLORIDE 0.9 % INJECTION
40 mg | Freq: Two times a day (BID) | INTRAMUSCULAR | Status: DC
Start: 2017-11-15 — End: 2017-11-16
  Administered 2017-11-15 – 2017-11-16 (×3): via INTRAVENOUS

## 2017-11-15 MED ORDER — APIXABAN 5 MG TABLET
5 mg | Freq: Two times a day (BID) | ORAL | Status: DC
Start: 2017-11-15 — End: 2017-11-16
  Administered 2017-11-15 – 2017-11-16 (×2): via ORAL

## 2017-11-15 MED ORDER — ONDANSETRON (PF) 4 MG/2 ML INJECTION
4 mg/2 mL | INTRAMUSCULAR | Status: DC | PRN
Start: 2017-11-15 — End: 2017-11-16
  Administered 2017-11-15: 18:00:00 via INTRAVENOUS

## 2017-11-15 MED ORDER — ALUM-MAG HYDROXIDE-SIMETH 200 MG-200 MG-20 MG/5 ML ORAL SUSP
200-200-20 mg/5 mL | ORAL | Status: DC | PRN
Start: 2017-11-15 — End: 2017-11-16

## 2017-11-15 MED ORDER — APIXABAN 2.5 MG TABLET
2.5 mg | Freq: Two times a day (BID) | ORAL | Status: DC
Start: 2017-11-15 — End: 2017-11-15

## 2017-11-15 MED ORDER — IOPAMIDOL 76 % IV SOLN
370 mg iodine /mL (76 %) | Freq: Once | INTRAVENOUS | Status: AC
Start: 2017-11-15 — End: 2017-11-15
  Administered 2017-11-15: 21:00:00 via INTRAVENOUS

## 2017-11-15 MED FILL — VITAMIN C 500 MG TABLET: 500 mg | ORAL | Qty: 1

## 2017-11-15 MED FILL — BD POSIFLUSH NORMAL SALINE 0.9 % INJECTION SYRINGE: INTRAMUSCULAR | Qty: 10

## 2017-11-15 MED FILL — HEPARIN (PORCINE) 5,000 UNIT/ML IJ SOLN: 5000 unit/mL | INTRAMUSCULAR | Qty: 1

## 2017-11-15 MED FILL — OXYCODONE 5 MG TAB: 5 mg | ORAL | Qty: 1

## 2017-11-15 MED FILL — ISOVUE-370  76 % INTRAVENOUS SOLUTION: 370 mg iodine /mL (76 %) | INTRAVENOUS | Qty: 80

## 2017-11-15 MED FILL — AMITRIPTYLINE 25 MG TAB: 25 mg | ORAL | Qty: 2

## 2017-11-15 MED FILL — ONDANSETRON (PF) 4 MG/2 ML INJECTION: 4 mg/2 mL | INTRAMUSCULAR | Qty: 2

## 2017-11-15 MED FILL — PANTOPRAZOLE 40 MG TAB, DELAYED RELEASE: 40 mg | ORAL | Qty: 1

## 2017-11-15 MED FILL — PANTOPRAZOLE 40 MG IV SOLR: 40 mg | INTRAVENOUS | Qty: 40

## 2017-11-15 MED FILL — CYCLOBENZAPRINE 10 MG TAB: 10 mg | ORAL | Qty: 1

## 2017-11-15 MED FILL — LAMOTRIGINE 25 MG TAB: 25 mg | ORAL | Qty: 4

## 2017-11-15 MED FILL — SIMVASTATIN 40 MG TAB: 40 mg | ORAL | Qty: 2

## 2017-11-15 MED FILL — OMEPRAZOLE 20 MG CAP, DELAYED RELEASE: 20 mg | ORAL | Qty: 1

## 2017-11-15 MED FILL — ELIQUIS 5 MG TABLET: 5 mg | ORAL | Qty: 1

## 2017-11-15 MED FILL — LIDOCAINE 5 % (700 MG/PATCH) ADHESIVE PATCH: 5 % | CUTANEOUS | Qty: 2

## 2017-11-15 MED FILL — LYRICA 25 MG CAPSULE: 25 mg | ORAL | Qty: 1

## 2017-11-15 NOTE — Other (Signed)
Bedside and Verbal shift change report given to Arnette FeltsMatthew Sawyer, LPN (oncoming nurse) by Trellis MomentBrianna Smart, RN (offgoing nurse). Report included the following information SBAR and Procedure Summary.

## 2017-11-15 NOTE — Progress Notes (Addendum)
Paged MD with Stat PVL Left leg results:    [id number], Phylliss BobVeronica Snowberger, DX = Closed head injury s/p fall. Stat PVL left leg = positive clot in left calf. If any orders ext. 6600, Thanks, Kathyrn SheriffAl Flores, RN [address].    Dr. Wenda LowMatriano returned page, No new orders, given Eliquis 5mg  as ordered.

## 2017-11-15 NOTE — Progress Notes (Signed)
Hospitalist Progress Note    Patient: [name] [name]               Sex: female          DOA: 11/13/2017       Date of Birth:  09/30/1950      Age:  67 y.o.        LOS:  LOS: 0 days     PCP: [name] CarpenSnellings, [name], MD   Assessment and Plan:   CHI s/p fall, ??Cervical Strain   Dizziness, near syncope  ??Contusion L leg,??Low back pain- acute on chronic  Numbness and tingling L leg  PE/ DVT Hx-on Eliquis   HTN.   HLD.  CAD s/p MI.  Asthma/ COPD.  Osteoporosis   SLE  DM   Seizure disorder  ??  PLAN:   Echocardiogram. Carotid PVL pending.  Fall precautions.  Troponin negative X3 negative, no arrythmia   repeat CT head results pending  resume eliquis.  Improved pain control of left lower extremity, x-rays negative for fracture.  Continue physical therapy, pain control with oxycodone as needed.  Continue Flexeril as needed, will discontinue Lyrica as patient states she did not tolerate well in the past.   -Complaints abdominal discomfort possible gastritis, currently on PPI twice daily dose.   -Tolerating oral intake, discontinue IV fluids.     Recommend to continue hospitalization.  Expected date of discharge: tomorrow  Plan for disposition - home with home health.      Subjective: LLE pain better, abdominal discomfort, able to walk with help     [name] [name] is a 67 y.o. year old female who is being seen for  syncope.    Objective: resting in bed      Vital Signs:  Visit Vitals  BP 112/62 (BP 1 Location: Right arm, BP Patient Position: Supine)   Pulse (!) 114   Temp 99.6 ??F (37.6 ??C)   Resp 24   Ht 5\' 7"  (1.702 m)   Wt 70.9 kg (156 lb 4.9 oz)   SpO2 100%   Breastfeeding? No   BMI 24.48 kg/m??     Physical Exam:  GENERAL: drowsy but arousable  HEAD: Head normocephalic, atraumatic   Eyes: Conjunctivae pink, anicteric  CARDIOVASCULAR: S1 and S2 heard, no murmur  RESPIRATORY: Effort normal. no crackles. No rales, no ronchi  ABDOMEN: Soft, nontender, nondistended.    NEUROLOGIC: Alert and oriented, Able to move all 4 extremities.   PSYCHIATRIC: Normal affect.    Lab Results:  Recent Results (from the past 24 hour(s))   CBC WITH AUTOMATED DIFF    Collection Time: 11/14/17  9:50 AM   Result Value Ref Range    WBC 5.5 4.0 - 11.0 1000/mm3    RBC 3.97 3.60 - 5.20 M/uL    HGB 8.4 (L) 13.0 - 17.2 gm/dl    HCT 16.128.6 (L) 09.637.0 - 50.0 %    MCV 72.0 (L) 80.0 - 98.0 fL    MCH 21.2 (L) 25.4 - 34.6 pg    MCHC 29.4 (L) 30.0 - 36.0 gm/dl    PLATELET 045311 409140 - 811450 1000/mm3    MPV 10.2 (H) 6.0 - 10.0 fL    RDW-SD 54.3 (H) 36.4 - 46.3      NRBC 0 0 - 0      IMMATURE GRANULOCYTES 0.2 0.0 - 3.0 %    NEUTROPHILS 60.4 34 - 64 %    LYMPHOCYTES 27.5 (L) 28 - 48 %  MONOCYTES 7.4 1 - 13 %    EOSINOPHILS 3.8 0 - 5 %    BASOPHILS 0.7 0 - 3 %   PROTHROMBIN TIME + INR    Collection Time: 11/14/17  9:50 AM   Result Value Ref Range    Prothrombin time 12.2 10.2 - 12.9 seconds    INR 1.1 0.1 - 1.1     METABOLIC PANEL, BASIC    Collection Time: 11/15/17  2:20 AM   Result Value Ref Range    Sodium 142 136 - 145 mEq/L    Potassium 4.1 3.5 - 5.1 mEq/L    Chloride 108 (H) 98 - 107 mEq/L    CO2 29 21 - 32 mEq/L    Glucose 107 (H) 74 - 106 mg/dl    BUN 10 7 - 25 mg/dl    Creatinine 0.8 0.6 - 1.3 mg/dl    GFR est AA >78.2      GFR est non-AA >60      Calcium 8.9 8.5 - 10.1 mg/dl    Anion gap 5 5 - 15 mmol/L   CBC WITH AUTOMATED DIFF    Collection Time: 11/15/17  2:23 AM   Result Value Ref Range    WBC 6.4 4.0 - 11.0 1000/mm3    RBC 4.01 3.60 - 5.20 M/uL    HGB 8.5 (L) 13.0 - 17.2 gm/dl    HCT 95.6 (L) 21.3 - 50.0 %    MCV 72.1 (L) 80.0 - 98.0 fL    MCH 21.2 (L) 25.4 - 34.6 pg    MCHC 29.4 (L) 30.0 - 36.0 gm/dl    PLATELET 086 578 - 469 1000/mm3    MPV 10.0 6.0 - 10.0 fL    RDW-SD 54.4 (H) 36.4 - 46.3      NRBC 0 0 - 0      IMMATURE GRANULOCYTES 0.2 0.0 - 3.0 %    NEUTROPHILS 68.2 (H) 34 - 64 %    LYMPHOCYTES 25.0 (L) 28 - 48 %    MONOCYTES 4.7 1 - 13 %    EOSINOPHILS 1.6 0 - 5 %    BASOPHILS 0.3 0 - 3 %    HEMOGLOBIN A1C W/O EAG    Collection Time: 11/15/17  2:23 AM   Result Value Ref Range    Hemoglobin A1c 6.4 (H) 4.2 - 6.3 %      Medications:  Current Facility-Administered Medications   Medication Dose Route Frequency   ??? apixaban (ELIQUIS) tablet 5 mg  5 mg Oral BID   ??? ascorbic acid (vitamin C) (VITAMIN C) tablet 250 mg  250 mg Oral BID   ??? lamoTRIgine (LaMICtal) tablet 100 mg  100 mg Oral BID   ??? omeprazole (PRILOSEC) capsule 20 mg  20 mg Oral DAILY   ??? simvastatin (ZOCOR) tablet 80 mg  80 mg Oral QHS   ??? sodium chloride (NS) flush 5-10 mL  5-10 mL IntraVENous Q8H   ??? sodium chloride (NS) flush 5-10 mL  5-10 mL IntraVENous PRN   ??? naloxone (NARCAN) injection 0.1 mg  0.1 mg IntraVENous PRN   ??? oxyCODONE IR (ROXICODONE) tablet 5 mg  5 mg Oral Q4H PRN   ??? cyclobenzaprine (FLEXERIL) tablet 10 mg  10 mg Oral TID PRN   ??? amitriptyline (ELAVIL) tablet 50 mg  50 mg Oral QHS   ??? pantoprazole (PROTONIX) tablet 40 mg  40 mg Oral BID   ??? lidocaine (LIDODERM) 5 % patch 2 Patch  2 Patch TransDERmal  Q24H   ??? influenza vaccine 2018-19 (36 mos+)(PF) (FLUZONE QUAD) injection 0.5 mL  0.5 mL IntraMUSCular PRIOR TO DISCHARGE     Madaline Savage, MD  November 15, 2017  8:12 AM

## 2017-11-15 NOTE — Progress Notes (Signed)
PAGER ID: [phone]   MESSAGE: [phone] [name], [name] Pt has 5mg  of Eliquis ordered BID. She normally takes 2.5 mg BID at home. Thanks [employer] AmericaBrianna [phone]

## 2017-11-15 NOTE — Other (Signed)
Bedside and Verbal shift change report given to Albertson'sBrianna Smart, RN  (Cabin crewoncoming nurse) by Prudence DavidsonJincy, RN (offgoing nurse). Report included the following information SBAR, Kardex and Cardiac Rhythm SR-ST.

## 2017-11-15 NOTE — Progress Notes (Signed)
Problem: Falls - Risk of  Goal: *Absence of Falls  Document Schmid Fall Risk and appropriate interventions in the flowsheet.  Outcome: Progressing Towards Goal  Fall Risk Interventions:  Mobility Interventions: Assess mobility with egress test, Bed/chair exit alarm, Patient to call before getting OOB, OT consult for ADLs, PT Consult for mobility concerns         Medication Interventions: Bed/chair exit alarm, Patient to call before getting OOB, Teach patient to arise slowly    Elimination Interventions: Bed/chair exit alarm, Call light in reach, Patient to call for help with toileting needs    History of Falls Interventions: Bed/chair exit alarm, Door open when patient unattended, Room close to nurse's station

## 2017-11-15 NOTE — Progress Notes (Signed)
Problem: Falls - Risk of  Goal: *Absence of Falls  Document Schmid Fall Risk and appropriate interventions in the flowsheet.  Outcome: Progressing Towards Goal  Fall Risk Interventions:  Mobility Interventions: Patient to call before getting OOB         Medication Interventions: Patient to call before getting OOB, Bed/chair exit alarm, Teach patient to arise slowly    Elimination Interventions: Bed/chair exit alarm, Call light in reach, Patient to call for help with toileting needs    History of Falls Interventions: Bed/chair exit alarm

## 2017-11-15 NOTE — Progress Notes (Signed)
physical Therapy EVALUATION    Patient: [name] [name] (67 y.o. female)  Room: 6622/6622    Date: 11/15/2017  Start Time:  0824  End Time:  0842    Primary Diagnosis: Closed head injury [S09.90XA]  On continuous oral anticoagulation [Z79.01]  Cervical strain [S16.1XXA]  Contusion of left leg [S80.12XA]  Low back pain [M54.5]  Numbness and tingling of left leg [R20.0, R20.2]  Abdominal muscle strain [S39.011A]  Dizziness [R42]         Precautions: Falls and Poor Safety Awareness..  Weight bearing precautions: None    Orders reviewed, chart reviewed, and initial evaluation completed on [facility].    ASSESSMENT :  Based on the objective data described below, the patient presents with Decreased Strength  Decreased ADL/Functional Activities  Decreased Transfer Abilities  Decreased Ambulation Ability/Technique  Decreased Balance  Increased Pain  Decreased Activity Tolerance  Decreased Pacing Skills  Increased Fatigue  decreased situational and deficit awareness. pt deconditioned. pt quite lethargic/drowsy. pt unsafe to ambulate further than at EOB. + abdominal pain and some cervical pain persists. pt non-commital to PT at this time. pt took 5 steps at EOB with mod assist.     Patient will benefit from skilled intervention to address the above impairments.  Patient???s rehabilitation potential is considered to be Good        PLAN :  Planned Interventions:  Functional mobility training Nutritional therapist Therapeutic exercises Therapeutic activities Neuro muscular re-education AD training Patient/caregiver education    Frequency/Duration: Patient will be followed by physical therapy 3x / Week and 5x / Week to address goals.    Recommendations:  Physical Therapy and Occupational Therapy  Discharge Recommendations: SNF and TBD, pending progress  Further Equipment Recommendations for Discharge: Walker        SUBJECTIVE:   Patient: c/o pain at abdomen     OBJECTIVE DATA SUMMARY:    Present illness history:   Problem List  Date Reviewed: 09-14-16          Codes Class Noted    Low back pain ICD-10-CM: M54.5  ICD-9-CM: 724.2  11/13/2017        Dizziness ICD-10-CM: R42  ICD-9-CM: 780.4  11/13/2017        Cervical strain ICD-10-CM: S16.1XXA  ICD-9-CM: 847.0  11/13/2017        Closed head injury ICD-10-CM: S09.90XA  ICD-9-CM: 959.01  11/13/2017        Numbness and tingling of left leg ICD-10-CM: R20.0, R20.2  ICD-9-CM: 782.0  11/13/2017        Contusion of left leg ICD-10-CM: X09.60AV  ICD-9-CM: 924.5  11/13/2017        Abdominal muscle strain ICD-10-CM: S39.011A  ICD-9-CM: 848.8  11/13/2017        On continuous oral anticoagulation ICD-10-CM: Z79.01  ICD-9-CM: V58.61  11/13/2017        Syncope and collapse ICD-10-CM: R55  ICD-9-CM: 780.2  02/18/2017        Hypokalemia ICD-10-CM: E87.6  ICD-9-CM: 276.8  02/18/2017        Syncope ICD-10-CM: R55  ICD-9-CM: 780.2  02/18/2017        Left rib fracture ICD-10-CM: S22.32XA  ICD-9-CM: 807.00  02/18/2017        Pneumoperitoneum ICD-10-CM: W09.8  ICD-9-CM: 568.89  12/26/2015        Acute CVA (cerebrovascular accident) [facility]) ICD-10-CM: I63.9  ICD-9-CM: 434.91  07/17/2015        Chest pain ICD-10-CM: R07.9  ICD-9-CM:  786.50  05/19/2015        Gastrointestinal hemorrhage ICD-10-CM: K92.2  ICD-9-CM: 578.9  01/01/2015        GI bleed ICD-10-CM: K92.2  ICD-9-CM: 578.9  11/26/2014        Back pain, lumbosacral ICD-10-CM: M54.5  ICD-9-CM: 724.2, 724.6  09/11/2011        Arthritis of knee ICD-10-CM: M17.10  ICD-9-CM: 716.96  01/04/2011        Encounter for long-term (current) use of other medications ICD-10-CM: Z79.899  ICD-9-CM: V58.69  01/04/2011        Abdominal pain, generalized ICD-10-CM: R10.84  ICD-9-CM: 789.07  01/04/2011        Neuropathy in diabetes [facility]) ICD-10-CM: E11.40  ICD-9-CM: 250.60, 357.2  01/04/2011        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  ICD-9-CM: 781.3  01/04/2011        Lupus ICD-10-CM: L93.0  ICD-9-CM: 695.4  12/03/2010         Osteoarthrosis, unspecified whether generalized or localized, unspecified site ICD-10-CM: M19.90  ICD-9-CM: 715.90  12/03/2010        Neuralgia, neuritis, and radiculitis, unspecified ICD-10-CM: [id number]  ICD-9-CM: 729.2  12/03/2010        Myalgia and myositis, unspecified ICD-10-CM: IMO0001  ICD-9-CM: 729.1  12/03/2010        Pain in limb ICD-10-CM: M79.609  ICD-9-CM: 729.5  12/03/2010        Pain in joint, multiple sites ICD-10-CM: M25.50  ICD-9-CM: 719.49  12/03/2010        Type II or unspecified type diabetes mellitus without mention of complication, not stated as uncontrolled ICD-10-CM: E11.9  ICD-9-CM: 250.00  12/03/2010        Rib pain ICD-10-CM: R07.81  ICD-9-CM: 786.50  10/10/2010             Past Medical history:   Past Medical History:   Diagnosis Date   ??? Acute hypercapnic respiratory failure (HCC) 02/05/2013    W/ Encephalopathy requiring BiPAP, Etiology Uncertain however Possible Narcotic Benzodiazipine Related?    ??? Adjustment disorder with mixed anxiety and depressed mood    ??? Aneurysm of internal carotid artery    ??? Arthritis of knee    ??? Asthma    ??? Autoimmune disease (HCC)    ??? CAD (coronary artery disease)    ??? Cervicalgia    ??? Chest pain 03/17/2014    Dobutamine Stress Echo: NORMAL WALL MOTION AND GLOBAL SYSTOLIC FUNCTION AT REST AND AFTER?? STRESS/EXERCISE WITH APPROPRIATE AUGMENTATION OF FUNCTION IN ALL SEGMENTS. NO EVIDENCE OF INDUCIBLE ISCHEMIA AT ADEQUATE HEART RATE WITH DOBUTAMINE STRESS.??            ??? Chronic low back pain 10/22/2014    [facility] MRI Lumbar w/o contrast: Moderate facet arthropathy at L4-L5 and L5-S1. Mild/moderate foraminal stenosis at these levels. Disc bulge with annular tear at L4-L5.    ??? Chronic pain syndrome    ??? Coccydynia    ??? Diabetes mellitus     NIDDM   ??? DVT (deep venous thrombosis) (HCC) 05/19/2007    [facility] CTA: 1. Small nonocclusive pulmonary embolus in a subsegmental branch to the left lower lobe. Larger filling defect in a segmental branch  to the right lower lobe, not as low in attenuation as is normally seen with a pulmonary embolus, however is seen in multiple planes and remains concerning for an additional nonocclusive pulmonary embolus.   ??? Fibromyalgia    ??? Gastric ulcer    ??? Gastritis 01/03/2015  [facility] Admission, EGD + H. Pylori/Gastritis   ??? Generalized osteoarthritis of multiple sites    ??? GI bleed 01/01/2015    [facility] Admit 5/2-01/07/2015   ??? H. pylori infection 01/03/2015    [facility] Admission, EGD + H. Pylori/Gastritis   ??? Hemiparesis, right (HCC)    ??? Hyperlipidemia    ??? Hypertension    ??? Irregular sleep-wake rhythm    ??? Knee joint pain    ??? Lumbar spondylosis     Orthopedic Spine Surgeon: Dr. Ree Kida L. Henrene Hawking    ??? Muscle spasm    ??? Normocytic anemia     Chronic Iron Deficiency Anemia   ??? Osteoporosis    ??? Pancreatitis    ??? Peripheral neuropathy    ??? Pulmonary embolism (HCC)     Multiple   ??? Seizures (HCC)    ??? Sensory ataxia    ??? SLE (systemic lupus erythematosus) (HCC)    ??? Stroke [facility])    ??? Subclinical hypothyroidism    ??? Vertigo    ??? Vitamin D deficiency        Prior Level of Function/Home Situation:   Home environment: Lives with Family, 1 story, Entrance steps 2.  Prior level of function: unable to drive, but able to sit in car, ADLs with assist and Ambulates with device  Prior level of Activities of Daily Living: Does not perform, heavy housework, light housework, preparing full meals, preparing light meals/snack and grocery shopping all done by granddaughter living in home   Home equipment: rolling walker      Patient found: Bed, Bed alarm, Family present and heating pad at cervical region.    Pain Assessment before PT session: Not rated  Pain Location:  Abdomen, cervical region   Pain Assessment after PT session: Not rated  Pain Location:  same  []           Yes, patient had pain medications  []           No, Patient has not had pain medications  []           Nurse notified    COGNITIVE STATUS:      Mental Status: Oriented x3, eyes closed, eyes open, questions repeated and lethargic.  Communication: soft spoken and limited verbalization.  Attention span: fair(15-69min).  Follows commands: able to redirect and inconsistent.  General Cognition: slow processing and delayed responses.  Hearing: grossly intact.  Vision:  grossly intact.      EXTREMITIES ASSESSMENT:      Tone & Sensation:   Normal  0 =No increase in muscle tone  Proprioception:   Intact    Strength:    Pt had difficulty following/participating in MMT  Appears grossly 3+to 4/5     Range Of Motion:  wfl    Functional mobility and balance status:     Supine to sit -  min. assist  Sit to Supine -  contact guard and min. assist  Sit to Stand -  mod. assist and max. assist  Stand to Sit -  min. assist      Balance:   Static sitting balance: good  Dynamic sitting balance: good  Static standing balance: fair-  Dynamic standing balance:f air-  Single leg stance:   Ambulation/Gait Training:  unsteady, decreased step height/length and downward gaze Walker mod assist5 steps at [employer] Training:     steps    Therapeutic Exercises:    Patient received/participated in  minutes of treatment (therapeutic exercises/activities)  immediately following evaluation and/or educational instruction during/immediately following PT evaluation.    Lower Extremities:    Upper Extremities:    mobility: OOB    Balance Activities:   Activity Tolerance:   poor+ and complaint of fatigue after standing activity    Final Location:   bed, bed alarm, all needs close, agrees to call for assistance and family present    COMMUNICATION/EDUCATION:   Education: Patient, Benefit of activity while hospitalized, Call for assistance, Out of bed 2-3 times/day, Staff assistance with mobility, Changes positions frequently, Sit out of bed for 45-60 minutes or as tolerated, Safety, Functional mobility, Reinforce needed and Role of PT  Barriers to Learning/Limitations: yes;  other lethargy     Please refer to care plan and patient education section for further details.    Thank you for this referral.  Dannielle HuhJaneen Brady, PT

## 2017-11-15 NOTE — Progress Notes (Addendum)
PAGER ID: [phone]   MESSAGE: [id number] [name], [place]- Pt is c/o nausea and vomited twice. Can she have something ordered? Thanks Colin MuldersBrianna #[phone]    [id number]#[phone]    1411-     PAGER ID: [phone]   MESSAGE: [phone] Phylliss BobEdwards, Anslee- Pt D Dimer is 1.37. Thanks [employer] America[employer]rianna [phone]

## 2017-11-15 NOTE — Progress Notes (Signed)
Peripheral Vascular Lab Duplex : Bilateral Lower Extremity Venous Duplex     1. Acute occlusive deep vein thrombosis noted in the left soleal. Age indeterminate non-occlusive deep vein thrombosis noted in one of two left peroneal veins.   2. No evidence of deep vein thrombosis noted in the right lower extremity.     Sonographers Comments:   1. Preliminary findings given to [name] SavageAlvir Flores, [name] [name]    Final report to follow  [name] [name] RVT, RVS

## 2017-11-16 MED ORDER — GABAPENTIN 300 MG CAP
300 mg | Freq: Three times a day (TID) | ORAL | Status: DC
Start: 2017-11-16 — End: 2017-11-16

## 2017-11-16 MED ORDER — MECLIZINE 12.5 MG TAB
12.5 mg | Freq: Three times a day (TID) | ORAL | Status: DC
Start: 2017-11-16 — End: 2017-11-16
  Administered 2017-11-16: 17:00:00 via ORAL

## 2017-11-16 MED ORDER — APIXABAN 5 MG TABLET
5 mg | ORAL_TABLET | Freq: Two times a day (BID) | ORAL | 0 refills | Status: AC
Start: 2017-11-16 — End: 2017-11-17

## 2017-11-16 MED ORDER — MECLIZINE 12.5 MG TAB
12.5 mg | ORAL_TABLET | Freq: Three times a day (TID) | ORAL | 0 refills | Status: AC | PRN
Start: 2017-11-16 — End: 2017-11-30

## 2017-11-16 MED ORDER — APIXABAN 5 MG (74 TABS) TABLETS STARTER DOSE PACK
5 mg (74 tabs) | ORAL | 0 refills | Status: AC
Start: 2017-11-16 — End: ?

## 2017-11-16 MED ORDER — PANTOPRAZOLE 40 MG TAB, DELAYED RELEASE
40 mg | Freq: Every day | ORAL | Status: DC
Start: 2017-11-16 — End: 2017-11-16

## 2017-11-16 MED ORDER — GABAPENTIN 300 MG CAP
300 mg | ORAL_CAPSULE | Freq: Three times a day (TID) | ORAL | 0 refills | Status: AC
Start: 2017-11-16 — End: 2017-12-16

## 2017-11-16 MED ORDER — APIXABAN 5 MG TABLET
5 mg | Freq: Two times a day (BID) | ORAL | Status: DC
Start: 2017-11-16 — End: 2017-11-16
  Administered 2017-11-16: 11:00:00 via ORAL

## 2017-11-16 MED FILL — GABAPENTIN 300 MG CAP: 300 mg | ORAL | Qty: 1

## 2017-11-16 MED FILL — PANTOPRAZOLE 40 MG IV SOLR: 40 mg | INTRAVENOUS | Qty: 40

## 2017-11-16 MED FILL — LAMOTRIGINE 25 MG TAB: 25 mg | ORAL | Qty: 4

## 2017-11-16 MED FILL — ELIQUIS 5 MG TABLET: 5 mg | ORAL | Qty: 1

## 2017-11-16 MED FILL — BD POSIFLUSH NORMAL SALINE 0.9 % INJECTION SYRINGE: INTRAMUSCULAR | Qty: 10

## 2017-11-16 MED FILL — OXYCODONE 5 MG TAB: 5 mg | ORAL | Qty: 1

## 2017-11-16 MED FILL — MECLIZINE 12.5 MG TAB: 12.5 mg | ORAL | Qty: 1

## 2017-11-16 MED FILL — LIDOCAINE 5 % (700 MG/PATCH) ADHESIVE PATCH: 5 % | CUTANEOUS | Qty: 2

## 2017-11-16 MED FILL — SIMVASTATIN 40 MG TAB: 40 mg | ORAL | Qty: 2

## 2017-11-16 MED FILL — ELIQUIS 5 MG TABLET: 5 mg | ORAL | Qty: 2

## 2017-11-16 MED FILL — VITAMIN C 500 MG TABLET: 500 mg | ORAL | Qty: 1

## 2017-11-16 NOTE — Discharge Summary (Signed)
Discharge Summary by Madaline Savage, MD at 11/16/17 1355                Author: Madaline Savage, MD  Service: Hospitalist  Author Type: Physician       Filed: 11/17/17 1413  Date of Service: 11/16/17 1355  Status: Signed          Editor: Madaline Savage, MD (Physician)                                                                                                  DISCHARGE SUMMARY       Patient Name: [name] [name]   Medical Record Number: [id number]   Date of Birth: 1950-11-20   Discharge Provider: Madaline Savage, MD   Primary Care Provider: Coralie Carpen, MD   Admit date: 11/13/2017   Discharge date:  11/16/17   Discharge Disposition: Home   Code Status: Full Code      Follow-up appointments:         Follow-up Information               Follow up With  Specialties  Details  Why  Contact Info              Snellings, Balinda Quails, MD  [facility]  In 1 week    [address]   [address]   [place] [address]   [phone]                Follow-up recommendations:   Check CBC, BMp in 1 week    Continue home Physical therapy   Meclizine prescribed for vertigo.    Patient made aware of fall precautions and risk of life threatening  bleeding while on blood thinners including  Eliquis.      Discharge diagnosis:   Acute LLE DVT.   S/p fall.   Chronic vertigo.   Cervical Strain-CT head ruled out intracranial bleed.   ??Dizziness/ near syncope.   Chronic lower back pain.   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   ??HTN.   ??HLD.   CAD s/p MI.   Asthma/ COPD.   Osteoporosis   SLE.   Seizure disorder      Hospital course:   Patient was admitted with near syncopal event.  CT of the head and cervical spine negative for fracture, acute pathology.  Patient was monitored on telemetry floor.  Patient's troponin remained negative.  While in the hospital patient became tachycardic  and worsening pain of left lower extremity.  X-ray of lumbosacral spine, hip, knee, ankle negative for fracture.    Patient  developed acute sinus tachycardia and elevated d-dimer, CT of the chest was negative for pulmonary embolism, left lower extremity PVL study was positive for acute DVT.     Patient was on Eliquis but on low-dose.  Patient's follow-up CT head did not show any interval bleeding.     Patient was started on treatment dose Eliquis and should be on 5 mg maintenance dose after 1 week dose.   Patient's chronic back pain was managed with  pain medication, added gabapentin.   Patient's echocardiogram showed normal EF.  Carotid PVL negative for high-grade stenosis.  Patient did not have further syncopal events.     Patient stated chronic vertigo improved with meclizine but recently did not have meclizine to use.  Patient was given prescription of meclizine.  Continued walker, home health with physical therapy, fall precaution discussed in detail.   Patient was given additional prescription for Eliquis for 2 doses until she gets the rest of the prescription refilled at the outpatient pharmacy.                                                                 Discharge M edications:                     Current Discharge Medication List              START taking these medications          Details        meclizine (ANTIVERT) 12.5 mg tablet  Take 1 Tab by mouth three (3) times daily as needed for Dizziness for up to 14 days.   Qty: 30 Tab, Refills:  0               apixaban (ELIQUIS) 5 mg (74 tabs) starter pack  Take 10 mg (two 5 mg tablets) by mouth twice a day for 7 days    Followed by 5 mg (one 5 mg tablet) by mouth twice a day   Qty: 1 Dose Pack, Refills:  0                     CONTINUE these medications which have NOT CHANGED          Details        omeprazole (PRILOSEC) 20 mg capsule  Take 20 mg by mouth daily.               ondansetron hcl (ZOFRAN) 4 mg tablet  Take 1 Tab by mouth every eight (8) hours as needed for Nausea.   Qty: 15 Tab, Refills:  0               cyclobenzaprine (FLEXERIL) 10 mg tablet  Take 10 mg by mouth  three (3) times daily as needed for Muscle Spasm(s).               sucralfate (CARAFATE) 100 mg/mL suspension  Take 1 tsp by mouth two (2) times a day.               ascorbic acid, vitamin C, (VITAMIN C) 250 mg tablet  Take 1 Tab by mouth two (2) times a day. Take with iron pill   Qty: 60 Tab, Refills:  3               oxyCODONE IR (OXY-IR) 15 mg immediate release tablet  Take 1 Tab by mouth four (4) times daily. Max Daily Amount: 60 mg.   Qty: 10 Tab, Refills:  0               simvastatin (ZOCOR) 80 mg tablet  Take 80 mg by mouth nightly.  lamoTRIgine (LAMICTAL) 100 mg tablet  Take 1 Tab by mouth two (2) times a day.   Qty: 60 Tab, Refills:  0               Cholecalciferol, Vitamin D3, (VITAMIN D) 1,000 unit Cap  Take 50,000 Units by mouth every seven (7) days.                     STOP taking these medications                  apixaban (ELIQUIS) 5 mg tablet  Comments:    Reason for Stopping:                      methocarbamol (ROBAXIN) 750 mg tablet  Comments:    Reason for Stopping:                      pantoprazole (PROTONIX) 40 mg tablet  Comments:    Reason for Stopping:                      predniSONE (DELTASONE) 20 mg tablet  Comments:    Reason for Stopping:                      pregabalin (LYRICA) 25 mg capsule  Comments:    Reason for Stopping:                          Discharge Diet:             Diet: Cardiac Diet      History of presenting illness:    [name] [name] is a 67 y.o. female with hx of HTN, HLD, CAD s/p MI, Asthma/ COPD, Osteoporosis, SLE, DM, and Seizure d/o with c/o having a  headache and multiple body aches and pains status post fall off a porch.  Patient states that she was chasing a small child who she thought was going to fall off porch.  She then lost her balance and fell off the porch from a height of 2 feet and landed  on her left side.  She did hit the back of her head but did not lose consciousness.  She has a mild residual occipital headache and neck pain.  She  denies any numbness or weakness of her extremities.  She has pain in her low back and left leg residual  with intermittent paresthesias to her left lower extremity.  He does complain of left lower abdominal discomfort.  She states that with she had an episode of chest pain 2 nights ago associated with nausea and vomiting.  She states this is since resolved.   She does admit to having dizzy spells and feeling like she was going to pass out on several occasions earlier in the day.  However she states this was not related to her fall.  She states that her urine has been dark as well but denies any dysuria  or UTI symptoms.  She did not have any diarrhea or blood in her stools.      Physical exam:    Visit Vitals      BP  117/76 (BP 1 Location: Left arm, BP Patient Position: Supine)     Pulse  81     Temp  98 ??F (36.7 ??C)     Resp  16  Ht  5\' 7"  (1.702 m)     Wt  71.2 kg (156 lb 15.5 oz)     SpO2  100%     Breastfeeding?  No        BMI  24.58 kg/m??        General appearance: alert, cooperative, no distress, appears stated age   Head: Normocephalic,  atraumatic   Eyes: conjunctivae/corneas clear.    Lungs: clear to auscultation bilaterally   Heart: regular rate and rhythm, S1, S2 normal, no murmur   Abdomen: soft, non-tender. Bowel sounds normal.       Diagnostic Results:   Xr Chest Sngl V      Result Date: 11/13/2017   IMPRESSION: 1.  No acute cardiopulmonary process. 2.  COPD.       Xr Spine Lumb 2 Or 3 V      Result Date: 11/13/2017   IMPRESSION: 1.  No acute abnormality of the lumbar spine. 2.  Mild multilevel spondylosis, worst at L4-5.       Xr Hip Lt W Or Wo Pelv 2-3 Vws      Result Date: 11/13/2017   IMPRESSION: No acute abnormality of the left hip.       Xr Knee Lt Min 4 V      Result Date: 11/13/2017   IMPRESSION: No acute abnormality of the left knee.       Xr Ankle Lt Min 3 V      Result Date: 11/13/2017   IMPRESSION: No acute abnormality of the left ankle.       Ct Head Wo Cont      Result Date: 11/15/2017    Impression: Normal for age noncontrast head CT Comment: CT images of the head were obtained without intravenous contrast. This was compared with November 13, 2017 and April 14, 2017. No interval change. The brain parenchyma, ventricles and sulci are within  the limits of normal for age. No infarcts, hemorrhages, mass effect or extracerebral collections are seen. DICOM format imaged data is available to non-affiliated external healthcare facilities or entities on a secure, media free, reciprocally searchable  basis with patient authorization  for 12 months following the date of the study.       Ct Head Wo Cont      Result Date: 11/13/2017   IMPRESSION: 1.  No acute intracranial hemorrhage or mass effect. 2.  Mild chronic age-related changes.        Cta Chest W Or W Wo Cont      Result Date: 11/15/2017   IMPRESSION: No acute pulmonary embolism.       Ct Spine Cerv Wo Cont      Result Date: 11/13/2017   Impression: 1.  No acute fracture or malalignment of the cervical spine. 2.  Moderate multilevel cervical spondylosis.       Ct Abd Pelv W Cont      Result Date: 11/13/2017   IMPRESSION: 1.  Marked biliary duct dilatation, unchanged. Cholecystectomy. 2.  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 No obstruction. 3.  Large amount of stool in the colon, suggesting constipation.       Cta Chest W Or W Wo Cont      Result Date: 11/15/2017   INDICATION: syncope, tachycardia, elevated Ddimer    COMPARISON: 06/14/2015. TECHNIQUE: CT scan of the chest  WITH intravenous contrast, using PE protocol.   Coronal and sagittal reformations were obtained. Maximum intensity projection images were also  produced. DICOM format imaged data  is available to non-affiliated external healthcare facilities or entities on a secure, media free, reciprocal searchable basis with patient authorization for 12 months following the date of the study. CONTRAST: 80 mL  Isovue-[id number].      DISCUSSION: Vessels: No filling defects within the main, lobar, or  segmental pulmonary arteries. The thoracic aorta and main pulmonary artery are normal caliber. No evidence of thoracic aortic dissection. Lungs:  Bibasilar atelectasis.  No consolidation or mass. Airways:  The major airways are clear. Pleura:  No pleural effusion or pneumothorax. Heart and mediastinum:  The heart and the mediastinum are normal. Abdomen:  Cholecystectomy. Marked diffuse intrahepatic biliary duct dilatation,  as seen on prior abdominal studies. 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 Bones and soft tissues:  The thoracic skeleton is normal for age. The soft tissues are unremarkable.       IMPRESSION: No acute pulmonary embolism.       Duplex Lower Ext Venous Bilat      Result Date: 11/15/2017                                                                Study ID: [id number]                                       [facility]                                            [address] [place],                                        [place]  [address]                         Lower Extremity Venous Report Name: JAKYIAH, BRIONES Date: 03          /17/2019 07:42 PM MRN: [id number]                        Patient Location: [id number] DOB: 1951-07-05                    Age: 63 yrs Gender:  Female                     Account #: [id number] Reason For Study: Elevated D-dimer Ordering Physician: Madaline Savage Performed By: Allayne Butcher, RVT Interpretation Summary Acute occlusive deep vein thrombosis noted in the left soleal. Age indeterminate  non-occlusive deep vein thrombosis noted in one of two left peroneal veins. No evidence of deep vein thrombosis noted in the right lower extremity.  _____________________________________________________________________________ Johnell Comings Complete  bilateral venous duplex performed. ICD 10- R78.87. RIGHT LEG The right common femoral, femoral, popliteal, posterior tibial and peroneal veins were examined with duplex ultrasound. The deep veins were patent and compressible with no evidence of intraluminal  thrombus. Spontaneous, phasic venous flow with normal augmentation was noted throughout the right leg. LEFT LEG The left common femoral, femoral, popliteal, soleal, posterior tibial and peroneal veins were examined with duplex ultrasound. The left soleal  is non- compressible with hypoechoic occlusive thrombus. One of two peroneal veins is partially compressible with heterogenous non-occlusive thrombus. Diminished spontaneous flow noted in the peroneal vein. Absent flow and doppler signals noted in the  soleal vein. Findings consistent with acute and age indeterminate deep vein thrombus. SONOGRAPHERS COMMENTS Preliminary findings given to Celedonio Savage, RN. Electronically signed byDR Billie Ruddy, MD   11/15/2017 10:20 PM       Discharge condition:   improved   Discharge activity and restrictions:  PT/OT per Home Health      Snellings, Balinda Quails, MD,  thank you for allowing [place] to participate in the care of this patient.       Madaline Savage, MD   11/16/2017   1:55 PM

## 2017-11-16 NOTE — Discharge Summary (Signed)
DISCHARGE SUMMARY     Patient Name: [name] [name]  Medical Record Number: [id number]  Date of Birth: 1951/07/25  Discharge Provider: Madaline Savage, MD  Primary Care Provider: Coralie Carpen, MD  Admit date: 11/13/2017  Discharge date:  11/16/17  Discharge Disposition: Home  Code Status: Full Code    Follow-up appointments:     Follow-up Information     Follow up With Specialties Details Why Contact Info    Snellings, Balinda Quails, MD [facility] In 1 week  [address]  [address]  [place] [address]  [phone]          Follow-up recommendations:  Check CBC, BMp in 1 week   Continue home Physical therapy  Meclizine prescribed for vertigo.   Patient made aware of fall precautions and risk of life threatening  bleeding while on blood thinners including  Eliquis.    Discharge diagnosis:  Acute LLE DVT.  S/p fall.  Chronic vertigo.  Cervical Strain-CT head ruled out intracranial bleed.  ??Dizziness/ near syncope.  Chronic lower back pain.  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  ??HTN.  ??HLD.  CAD s/p MI.  Asthma/ COPD.  Osteoporosis  SLE.  Seizure disorder    Hospital course:  Patient was admitted with near syncopal event.  CT of the head and cervical spine negative for fracture, acute pathology.  Patient was monitored on telemetry floor.  Patient's troponin remained negative.  While in the hospital patient became tachycardic and worsening pain of left lower extremity.  X-ray of lumbosacral spine, hip, knee, ankle negative for fracture.   Patient developed acute sinus tachycardia and elevated d-dimer, CT of the chest was negative for pulmonary embolism, left lower extremity PVL study was positive for acute DVT.    Patient was on Eliquis but on low-dose.  Patient's follow-up CT head did not show any interval bleeding.    Patient was started on treatment dose Eliquis and should be on 5 mg maintenance dose after 1 week dose.   Patient's chronic back pain was managed with pain medication, added gabapentin.  Patient's echocardiogram showed normal EF.  Carotid PVL negative for high-grade stenosis.  Patient did not have further syncopal events.    Patient stated chronic vertigo improved with meclizine but recently did not have meclizine to use.  Patient was given prescription of meclizine.  Continued walker, home health with physical therapy, fall precaution discussed in detail.  Patient was given additional prescription for Eliquis for 2 doses until she gets the rest of the prescription refilled at the outpatient pharmacy.                                                               Discharge Medications:                 Current Discharge Medication List      START taking these medications    Details   meclizine (ANTIVERT) 12.5 mg tablet Take 1 Tab by mouth three (3) times daily as needed for Dizziness for up to 14 days.  Qty: 30 Tab, Refills: 0      apixaban (ELIQUIS) 5 mg (74 tabs) starter pack Take 10 mg (two 5 mg tablets) by mouth twice a day for 7 days  Followed by 5 mg (one 5 mg tablet) by mouth twice a day  Qty: 1 Dose Pack, Refills: 0         CONTINUE these medications which have NOT CHANGED    Details   omeprazole (PRILOSEC) 20 mg capsule Take 20 mg by mouth daily.      ondansetron hcl (ZOFRAN) 4 mg tablet Take 1 Tab by mouth every eight (8) hours as needed for Nausea.  Qty: 15 Tab, Refills: 0      cyclobenzaprine (FLEXERIL) 10 mg tablet Take 10 mg by mouth three (3) times daily as needed for Muscle Spasm(s).      sucralfate (CARAFATE) 100 mg/mL suspension Take 1 tsp by mouth two (2) times a day.      ascorbic acid, vitamin C, (VITAMIN C) 250 mg tablet Take 1 Tab by mouth two (2) times a day. Take with iron pill  Qty: 60 Tab, Refills: 3      oxyCODONE IR (OXY-IR) 15 mg immediate release tablet Take 1 Tab by mouth four (4) times daily. Max Daily Amount: 60 mg.  Qty: 10 Tab, Refills: 0       simvastatin (ZOCOR) 80 mg tablet Take 80 mg by mouth nightly.      lamoTRIgine (LAMICTAL) 100 mg tablet Take 1 Tab by mouth two (2) times a day.  Qty: 60 Tab, Refills: 0      Cholecalciferol, Vitamin D3, (VITAMIN D) 1,000 unit Cap Take 50,000 Units by mouth every seven (7) days.         STOP taking these medications       apixaban (ELIQUIS) 5 mg tablet Comments:   Reason for Stopping:         methocarbamol (ROBAXIN) 750 mg tablet Comments:   Reason for Stopping:         pantoprazole (PROTONIX) 40 mg tablet Comments:   Reason for Stopping:         predniSONE (DELTASONE) 20 mg tablet Comments:   Reason for Stopping:         pregabalin (LYRICA) 25 mg capsule Comments:   Reason for Stopping:             Discharge Diet:            Diet: Cardiac Diet    History of presenting illness:   [name] [name] is a 67 y.o. female with hx of HTN, HLD, CAD s/p MI, Asthma/ COPD, Osteoporosis, SLE, DM, and Seizure d/o with c/o having a headache and multiple body aches and pains status post fall off a porch.  Patient states that she was chasing a small child who she thought was going to fall off porch.  She then lost her balance and fell off the porch from a height of 2 feet and landed on her left side.  She did hit the back of her head but did not lose consciousness.  She has a mild residual occipital headache and neck pain.  She denies any numbness or weakness of her extremities.  She has pain in her low back and left leg residual with intermittent paresthesias to her left lower extremity.  He does complain of left lower abdominal discomfort.  She states that with she had an episode of chest pain 2 nights ago associated with nausea and vomiting.  She states this is since resolved.  She does admit to having dizzy spells and feeling like she was going to pass out on several occasions earlier in the day.  However  she states this was not related to her fall.  She states  that her urine has been dark as well but denies any dysuria or UTI symptoms.  She did not have any diarrhea or blood in her stools.    Physical exam:   Visit Vitals  BP 117/76 (BP 1 Location: Left arm, BP Patient Position: Supine)   Pulse 81   Temp 98 ??F (36.7 ??C)   Resp 16   Ht 5\' 7"  (1.702 m)   Wt 71.2 kg (156 lb 15.5 oz)   SpO2 100%   Breastfeeding? No   BMI 24.58 kg/m??     General appearance: alert, cooperative, no distress, appears stated age  Head: Normocephalic,  atraumatic  Eyes: conjunctivae/corneas clear.   Lungs: clear to auscultation bilaterally  Heart: regular rate and rhythm, S1, S2 normal, no murmur  Abdomen: soft, non-tender. Bowel sounds normal.     Diagnostic Results:  Xr Chest Sngl V    Result Date: 11/13/2017  IMPRESSION: 1.  No acute cardiopulmonary process. 2.  COPD.     Xr Spine Lumb 2 Or 3 V    Result Date: 11/13/2017  IMPRESSION: 1.  No acute abnormality of the lumbar spine. 2.  Mild multilevel spondylosis, worst at L4-5.     Xr Hip Lt W Or Wo Pelv 2-3 Vws    Result Date: 11/13/2017  IMPRESSION: No acute abnormality of the left hip.     Xr Knee Lt Min 4 V    Result Date: 11/13/2017  IMPRESSION: No acute abnormality of the left knee.     Xr Ankle Lt Min 3 V    Result Date: 11/13/2017  IMPRESSION: No acute abnormality of the left ankle.     Ct Head Wo Cont    Result Date: 11/15/2017  Impression: Normal for age noncontrast head CT Comment: CT images of the head were obtained without intravenous contrast. This was compared with November 13, 2017 and April 14, 2017. No interval change. The brain parenchyma, ventricles and sulci are within the limits of normal for age. No infarcts, hemorrhages, mass effect or extracerebral collections are seen. DICOM format imaged data is available to non-affiliated external healthcare facilities or entities on a secure, media free, reciprocally searchable basis with patient authorization  for 12 months following the date of the study.     Ct Head Wo Cont     Result Date: 11/13/2017  IMPRESSION: 1.  No acute intracranial hemorrhage or mass effect. 2.  Mild chronic age-related changes.      Cta Chest W Or W Wo Cont    Result Date: 11/15/2017  IMPRESSION: No acute pulmonary embolism.     Ct Spine Cerv Wo Cont    Result Date: 11/13/2017  Impression: 1.  No acute fracture or malalignment of the cervical spine. 2.  Moderate multilevel cervical spondylosis.     Ct Abd Pelv W Cont    Result Date: 11/13/2017  IMPRESSION: 1.  Marked biliary duct dilatation, unchanged. Cholecystectomy. 2.  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 No obstruction. 3.  Large amount of stool in the colon, suggesting constipation.     Cta Chest W Or W Wo Cont    Result Date: 11/15/2017  INDICATION: syncope, tachycardia, elevated Ddimer    COMPARISON: 06/14/2015. TECHNIQUE: CT scan of the chest  WITH intravenous contrast, using PE protocol.   Coronal and sagittal reformations were obtained. Maximum intensity projection images were also produced. DICOM format imaged data is available to non-affiliated external  healthcare facilities or entities on a secure, media free, reciprocal searchable basis with patient authorization for 12 months following the date of the study. CONTRAST: 80 mL Isovue-370.      DISCUSSION: Vessels: No filling defects within the main, lobar, or segmental pulmonary arteries. The thoracic aorta and main pulmonary artery are normal caliber. No evidence of thoracic aortic dissection. Lungs:  Bibasilar atelectasis. No consolidation or mass. Airways:  The major airways are clear. Pleura:  No pleural effusion or pneumothorax. Heart and mediastinum:  The heart and the mediastinum are normal. Abdomen:  Cholecystectomy. Marked diffuse intrahepatic biliary duct dilatation, as seen on prior abdominal studies. 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 Bones and soft tissues:  The thoracic skeleton is normal for age. The soft tissues are unremarkable.      IMPRESSION: No acute pulmonary embolism.     Duplex Lower Ext Venous Bilat    Result Date: 11/15/2017                                                               Study ID: [id number]                                       [facility]                                           [address] [place],                                        [place]                                          [id number]  Lower Extremity Venous Report Name: Damaris SchoonerDWARDS, Vernadine EStudy Date: 03          /17/2019 07:42 PM MRN: [id number]                        Patient Location: [id number] DOB: 10-Aug-1951                    Age: [age] yrs Gender: Female                     Account #: [id number] Reason For Study: Elevated D-dimer Ordering Physician: Madaline SavageSKANDARAJ, Zamirah Denny Performed By: Allayne Butcherixon, [name], RVT Interpretation Summary Acute occlusive deep vein thrombosis noted in the left soleal. Age indeterminate non-occlusive deep vein thrombosis noted in one of two left peroneal veins. No evidence of deep vein thrombosis noted in the right lower extremity. _____________________________________________________________________________ Johnell Comings_ QUALITY/PROCEDURE Complete bilateral venous duplex performed. ICD 10- R78.87. RIGHT LEG The right common femoral, femoral, popliteal, posterior tibial and peroneal veins were examined with duplex ultrasound. The deep veins were patent and compressible with no evidence of intraluminal thrombus. Spontaneous, phasic venous flow with normal augmentation was noted throughout the right leg. LEFT LEG The left common femoral, femoral, popliteal, soleal, posterior tibial and peroneal veins were examined with duplex ultrasound. The left soleal is non- compressible with hypoechoic  occlusive thrombus. One of two peroneal veins is partially compressible with heterogenous non-occlusive thrombus. Diminished spontaneous flow noted in the peroneal vein. Absent flow and doppler signals noted in the soleal vein. Findings consistent with acute and age indeterminate deep vein thrombus. SONOGRAPHERS COMMENTS Preliminary findings given to Celedonio SavageAlvir Flores, RN. Electronically signed byDR Billie RuddyFrederick Southern, MD   11/15/2017 10:20 PM     Discharge condition:  improved  Discharge activity and restrictions: PT/OT per Home Health    Snellings, Balinda QuailsJohn E, MD,  thank you for allowing us to participate in the care of this patient.     Madaline SavageKulasegaram Mia Winthrop, MD  11/16/2017  1:55 PM

## 2017-11-16 NOTE — Progress Notes (Signed)
Problem: Falls - Risk of  Goal: *Absence of Falls  Document Schmid Fall Risk and appropriate interventions in the flowsheet.  Fall Risk Interventions:  Mobility Interventions: Bed/chair exit alarm, Communicate number of staff needed for ambulation/transfer, Utilize walker, cane, or other assistive device         Medication Interventions: Bed/chair exit alarm, Patient to call before getting OOB, Teach patient to arise slowly    Elimination Interventions: Bed/chair exit alarm, Call light in reach, Patient to call for help with toileting needs, Toilet paper/wipes in reach, Toileting schedule/hourly rounds, Urinal in reach    History of Falls Interventions: Bed/chair exit alarm, Door open when patient unattended        Problem: Pressure Injury - Risk of  Goal: *Prevention of pressure injury  Document Braden Scale and appropriate interventions in the flowsheet.  Outcome: Progressing Towards Goal  Pressure Injury Interventions:  Sensory Interventions: Assess changes in LOC, Float heels, Keep linens dry and wrinkle-free, Turn and reposition approx. every two hours (pillows and wedges if needed)         Activity Interventions: Pressure redistribution bed/mattress(bed type)    Mobility Interventions: Turn and reposition approx. every two hours(pillow and wedges)    Nutrition Interventions: Document food/fluid/supplement intake    Friction and Shear Interventions: HOB 30 degrees or less, Feet elevated on foot rest, Lift sheet, Lift team/patient mobility team

## 2017-11-16 NOTE — Other (Signed)
Home Care Face to Face Encounter  Patient???s Name: [name] [name]    Date of Birth: 08/08/1951    Primary Diagnosis: Closed head injury, dizziness    Date of Face to Face:   11/16/17                                  Face to Face Encounter findings are related to primary reason for home care:   yes.     1. I certify that the patient needs intermittent care as follows: skilled nursing care:  skilled observation/assessment, patient education  physical therapy: strengthening, stretching/ROM, transfer training, gait/stair training, balance training and pt/caregiver education  occupational therapy:  ADL safety (ie. cooking, bathing, dressing), ROM and pt/caregiver education    2. I certify that this patient is homebound, that is: 1) patient requires the use of a walker device, special transportation, or assistance of another to leave the home; or 2) patient's condition makes leaving the home medically contraindicated; and 3) patient has a normal inability to leave the home and leaving the home requires considerable and taxing effort.  Patient may leave the home for infrequent and short duration for medical reasons, and occasional absences for non-medical reasons. Homebound status is due to the following functional limitations: Patient with strength deficits limiting the performance of all ADL's without caregiver assistance or the use of an assistive device.  Patient with poor safety awareness and is at risk for falls without assistance of another person and the use of an assistive device.  Patient with poor ambulation endurance limiting their safe ability to ascend/descend the required number of steps to leave the home.    3. I certify that this patient is under my care and that I, or a nurse practitioner or physician???s assistant, or clinical nurse specialist, or certified nurse midwife, working with me, had a Face-to-Face Encounter that meets the physician Face-to-Face Encounter requirements.  The  following are the clinical findings from the Face-to-Face encounter that support the need for skilled services and is a summary of the encounter: 11/16/17    See discharge summary      Paschal DoppClara C Sirma, RN  11/16/2017

## 2017-11-16 NOTE — Progress Notes (Signed)
Preliminary findings:  There is no evidence of hemodynamically significant stenosis in either carotid system.  Antegrade  Flow in both vertebral arteries.  Subclavian artery flow is normal bilaterally.  Final report to follow.    Carrie Stoffer RVT

## 2017-11-16 NOTE — Progress Notes (Signed)
2D echocardiogram was completed.

## 2017-11-16 NOTE — Progress Notes (Signed)
This SW received a call this evening from [facility] ext. [phone] to assist with Eliquis script the patient was to receive.  Evidently, the Outpatient Pharmacy was to give a 30 day supply, but it could not be located by staff after hours.  Consulted with Clelia SchaumannJessica RN Charge Nurse, Helmut MusterAlicia RN, Inpatient Pharmacy Tasia Catchingsraig, and Verita SchneidersMarsha Hogge CM Nurse Manager.  Dr. Estil DaftSkandaraj provided another script so, that we could supply a two doses until we can speak to the Outpatient Pharmacy in am.  Patient made aware of what arrangements are being made for her medications.

## 2017-11-16 NOTE — Progress Notes (Signed)
Patient admitted on 11/13/2017 from home with   Chief Complaint   Patient presents with   ??? Fall   ??? Shoulder Pain   ??? Leg Pain        The patient has been admitted to the hospital 1 times in the past 12 months.    Previous 4 Admission Dates Admission and Discharge Diagnosis Interventions Barriers Disposition                                   Tentative dc plan:    Home with home health and family assistance, and PCP follow up    Facility if plan    Anticipated Discharge Date:   3/18    PCP: [name] [name]     Specialists:         Face sheet information, address, contact info and insurance verified yes    Dialysis Unit/ chair time / access:    n/a    Pharmacy:     [facility]   Any issues getting medications no    DME at home and provider:    Has a rollator    O2 at home no    Home Environment:    Lives at [facility]  [place] [id number]    @HOMEPHONE @.     Prior to admission open services:     n/a    Home Health Agency-     n/a    Personal Care Agency-    n/a    Extended Emergency Contact Information  Primary Emergency Contact: [name] [name]  Address: [address]           [place], [place] [id number] [place]  Home Phone: [phone]  Relation: Daughter      Transportation:     Family will transport home    Therapy Recommendations:  OT :y/n     n  PT :y/n     n  SLP :y/n    n    RT Home O2 Evaluation :y/n     n    Wound Care: y/n     n    Change consult (formerly [facility]) :    n    Case Management Assessment    ABUSE/NEGLECT SCREENING   Physical Abuse/Neglect: Denies   Sexual Abuse: Denies   Sexual Abuse: Denies   Other Abuse/Issues: Denies          PRIMARY DECISION MAKER                                   CARE MANAGEMENT INTERVENTIONS   Readmission Interview Completed: Not Applicable   PCP Verified by CM: Yes           Mode of Transport at Discharge: Other (see comment)(Daughter [place])       Transition of Care Consult (CM Consult): Discharge Planning                    Physical Therapy Consult: Yes           Current Support Network: Own Home, Family Lives Nearby   Reason for Referral: DCP Rounds   History Provided By: Patient   Patient Orientation: Alert and Oriented, Person, Place, Situation, Self   Cognition: Alert   Support System Response: Concerned, Cooperative  Prior Functional Level: Assistance with the following:, Mobility, Shopping, Housework   Current Functional Level: Assistance with the following:, Shopping, Mobility, Housework   Primary Language: English   Can patient return to prior living arrangement: Yes   Ability to make needs known:: Good   Family able to assist with home care needs:: Yes                       Confirm Follow Up Transport: Family       Plan discussed with Pt/Family/Caregiver: Yes          DISCHARGE LOCATION   Discharge Placement: Home with home health

## 2017-11-16 NOTE — Progress Notes (Incomplete)
Pt discharged to home. Pt gave verbal understanding of discharge instructions. Pt aware of follow up instructions and scripts. Pt has taken all belonging on discharge. [facility]

## 2017-11-16 NOTE — Progress Notes (Signed)
I received an email from [employer] CorporationClara Sirma (CM) requesting a wheeled walker prior to d/c. Patient states at delivery she currently has a walker at home. I will void sales order and inform CM.

## 2017-11-16 NOTE — Progress Notes (Addendum)
This cm spoke with pharmacy and patient never picked up her eloquis.   Call to patient, went to vm. Left generic message , no identifiers on vm.   3/25  Tried again to call patient, still a unidentified vm .   Call to pcp office dr snellings, patient did not make follow up appointment  Call to daughter listed on face sheet, disconnected.   Call to [facility], found case manager ebony brown [phone]  Lm on cm vm.   Will update dr Estil Daftskandaraj

## 2017-11-16 NOTE — Other (Addendum)
----------  DocumentID: [id number]------------------------------------------------              [facility]                       Patient Education Report         Name: [name] [name], [name] [name]                  Date: 11/14/2017    MRN: [id number]                    Time: 12:16:59 AM         Patient ordered video: 'Patient Safety: Stay Safe While you are in the Hospital'    from [id number] via phone number: [phone] at 12:16:59 AM    Description: This program outlines some of the precautions patients can take to ensure a speedy recovery without extra complications. The video emphasizes the importance of communicating with the healthcare team.    ----------DocumentID: [id number]------------------------------------------------                       [facility]          Patient Education Report - Discharge Summary        Date: 11/16/2017   Time: 8:00:50 PM   Name: [name] [name], [name] [name]   MRN: [id number]      Account Number: [id number]      Education History:        Patient ordered video: 'Patient Safety: Stay Safe While you are in the Hospital' from [id number] on 11/14/2017 12:16:59 AM

## 2017-11-16 NOTE — Other (Signed)
Bedside and Verbal shift change report given to Helmut MusterAlicia RN (oncoming nurse) by Susy FrizzleMatt LPN, AL RN (offgoing nurse). Report included the following information SBAR, Kardex, Recent Results and Cardiac Rhythm NSR.

## 2017-11-16 NOTE — Progress Notes (Signed)
Discharge Plan:   Home with PCP follow up and family assistance    Discharge Date:     11/16/2017     [facility]     Home Health Needed:     PT/OT/SN    Home Health Agency:   [facility] email sent    TCC Referral:     n/a    Medication Assistance given     N       meds given (please list)     n/a    Change(MDC/SSDI) Referral/outcome    n/a     Transportation: Family will transport

## 2017-11-16 NOTE — Progress Notes (Addendum)
Pt c/o pain to back and side. Requested to be assisted in turning. Nurse turned pt and propped her up with pillows. Pt was offered oxycodone hcl but pt said she would wait at this time. Pt was told to call nurse if pain got worse.     1620: pt c/o worsened pain to her L flank and posterior head. Pt in tears laying on right side. MD made aware of current pain status. Oral pain medication administered. Will recheck pain level in 45 mins.    1717: pt states pain level has come down to a 7 from a 10. Pt no longer crying.

## 2018-03-03 ENCOUNTER — Emergency Department: Admit: 2018-03-03 | Payer: PRIVATE HEALTH INSURANCE | Primary: Internal Medicine

## 2018-03-03 ENCOUNTER — Inpatient Hospital Stay
Admit: 2018-03-03 | Discharge: 2018-03-03 | Disposition: A | Discharge status: Home / Self Care | Payer: PRIVATE HEALTH INSURANCE | Attending: Emergency Medicine

## 2018-03-03 DIAGNOSIS — R079 Chest pain, unspecified: Secondary | ICD-10-CM

## 2018-03-03 LAB — EKG, 12 LEAD, INITIAL
Atrial Rate: 70 {beats}/min
Calculated P Axis: 87 degrees
Calculated R Axis: 68 degrees
Calculated T Axis: 62 degrees
Diagnosis: NORMAL
P-R Interval: 164 ms
Q-T Interval: 376 ms
QRS Duration: 86 ms
QTC Calculation (Bezet): 406 ms
Ventricular Rate: 70 {beats}/min

## 2018-03-03 LAB — LIPASE
Lipase: 104 U/L (ref 73–393)
Lipase: 104 U/L (ref 73–393)

## 2018-03-03 LAB — CBC WITH AUTOMATED DIFF
BASOPHILS: 1 % (ref 0–3)
EOSINOPHILS: 3.9 % (ref 0–5)
HCT: 30.6 % — ABNORMAL LOW (ref 37.0–50.0)
HGB: 8.8 gm/dl — ABNORMAL LOW (ref 13.0–17.2)
IMMATURE GRANULOCYTES: 0.2 % (ref 0.0–3.0)
LYMPHOCYTES: 32 % (ref 28–48)
MCH: 20.7 pg — ABNORMAL LOW (ref 25.4–34.6)
MCHC: 28.8 gm/dl — ABNORMAL LOW (ref 30.0–36.0)
MCV: 72 fL — ABNORMAL LOW (ref 80.0–98.0)
MONOCYTES: 7.8 % (ref 1–13)
MPV: 9.9 fL (ref 6.0–10.0)
NEUTROPHILS: 55.1 % (ref 34–64)
NRBC: 0 (ref 0–0)
PLATELET: 264 10*3/uL (ref 140–450)
RBC: 4.25 M/uL (ref 3.60–5.20)
RDW-SD: 51.1 — ABNORMAL HIGH (ref 36.4–46.3)
WBC: 6.2 10*3/uL (ref 4.0–11.0)

## 2018-03-03 LAB — METABOLIC PANEL, COMPREHENSIVE
ALT (SGPT): 16 U/L (ref 12–78)
AST (SGOT): 24 U/L (ref 15–37)
Albumin: 3.6 gm/dl (ref 3.4–5.0)
Alk. phosphatase: 98 U/L (ref 45–117)
Anion gap: 5 mmol/L (ref 5–15)
BUN: 12 mg/dl (ref 7–25)
Bilirubin, total: 0.5 mg/dl (ref 0.2–1.0)
CO2: 27 mEq/L (ref 21–32)
Calcium: 8.7 mg/dl (ref 8.5–10.1)
Chloride: 109 mEq/L — ABNORMAL HIGH (ref 98–107)
Creatinine: 0.8 mg/dl (ref 0.6–1.3)
GFR est AA: >60
GFR est non-AA: >60
Glucose: 87 mg/dl (ref 74–106)
Potassium: 4.6 mEq/L (ref 3.5–5.1)
Protein, total: 7.5 gm/dl (ref 6.4–8.2)
Sodium: 140 mEq/L (ref 136–145)

## 2018-03-03 LAB — TROPONIN I: Troponin-I: <0.015 ng/ml (ref 0.000–0.045)

## 2018-03-03 LAB — CBC WITH AUTO DIFFERENTIAL
Basophils %: 1 % (ref 0–3)
Eosinophils %: 3.9 % (ref 0–5)
Hematocrit: 30.6 % — ABNORMAL LOW (ref 37.0–50.0)
Hemoglobin: 8.8 gm/dl — ABNORMAL LOW (ref 13.0–17.2)
Immature Granulocytes: 0.2 % (ref 0.0–3.0)
Lymphocytes %: 32 % (ref 28–48)
MCH: 20.7 pg — ABNORMAL LOW (ref 25.4–34.6)
MCHC: 28.8 gm/dl — ABNORMAL LOW (ref 30.0–36.0)
MCV: 72 fL — ABNORMAL LOW (ref 80.0–98.0)
MPV: 9.9 fL (ref 6.0–10.0)
Monocytes %: 7.8 % (ref 1–13)
Neutrophils %: 55.1 % (ref 34–64)
Nucleated RBCs: 0 (ref 0–0)
Platelets: 264 10*3/uL (ref 140–450)
RBC: 4.25 M/uL (ref 3.60–5.20)
RDW-SD: 51.1 — ABNORMAL HIGH (ref 36.4–46.3)
WBC: 6.2 10*3/uL (ref 4.0–11.0)

## 2018-03-03 LAB — COMPREHENSIVE METABOLIC PANEL
ALT: 16 U/L (ref 12–78)
AST: 24 U/L (ref 15–37)
Albumin: 3.6 gm/dl (ref 3.4–5.0)
Alkaline Phosphatase: 98 U/L (ref 45–117)
Anion Gap: 5 mmol/L (ref 5–15)
BUN: 12 mg/dl (ref 7–25)
CO2: 27 mEq/L (ref 21–32)
Calcium: 8.7 mg/dl (ref 8.5–10.1)
Chloride: 109 mEq/L — ABNORMAL HIGH (ref 98–107)
Creatinine: 0.8 mg/dl (ref 0.6–1.3)
EGFR IF NonAfrican American: >60
GFR African American: >60
Glucose: 87 mg/dl (ref 74–106)
Potassium: 4.6 mEq/L (ref 3.5–5.1)
Sodium: 140 mEq/L (ref 136–145)
Total Bilirubin: 0.5 mg/dl (ref 0.2–1.0)
Total Protein: 7.5 gm/dl (ref 6.4–8.2)

## 2018-03-03 LAB — EKG 12-LEAD
Atrial Rate: 70 {beats}/min
Diagnosis: NORMAL
P Axis: 87 degrees
P-R Interval: 164 ms
Q-T Interval: 376 ms
QRS Duration: 86 ms
QTc Calculation (Bazett): 406 ms
R Axis: 68 degrees
T Axis: 62 degrees
Ventricular Rate: 70 {beats}/min

## 2018-03-03 LAB — TROPONIN: Troponin I: <0.015 ng/ml (ref 0.000–0.045)

## 2018-03-03 MED ORDER — HALOPERIDOL LACTATE 5 MG/ML IJ SOLN
5 mg/mL | Freq: Once | INTRAMUSCULAR | Status: AC
Start: 2018-03-03 — End: 2018-03-03
  Administered 2018-03-03: 15:00:00 via INTRAVENOUS

## 2018-03-03 MED ORDER — DIPHENHYDRAMINE HCL 50 MG/ML IJ SOLN
50 mg/mL | Freq: Once | INTRAMUSCULAR | Status: AC
Start: 2018-03-03 — End: 2018-03-03
  Administered 2018-03-03: 15:00:00 via INTRAVENOUS

## 2018-03-03 MED ORDER — SODIUM CHLORIDE 0.9% BOLUS IV
0.9 % | Freq: Once | INTRAVENOUS | Status: AC
Start: 2018-03-03 — End: 2018-03-03
  Administered 2018-03-03: 15:00:00 via INTRAVENOUS

## 2018-03-03 MED ORDER — DEXAMETHASONE SODIUM PHOSPHATE 10 MG/ML IJ SOLN
10 mg/mL | INTRAMUSCULAR | Status: AC
Start: 2018-03-03 — End: 2018-03-03
  Administered 2018-03-03: 15:00:00 via INTRAVENOUS

## 2018-03-03 MED FILL — HALOPERIDOL LACTATE 5 MG/ML IJ SOLN: 5 mg/mL | INTRAMUSCULAR | Qty: 1

## 2018-03-03 MED FILL — DIPHENHYDRAMINE HCL 50 MG/ML IJ SOLN: 50 mg/mL | INTRAMUSCULAR | Qty: 1

## 2018-03-03 MED FILL — DEXAMETHASONE SODIUM PHOSPHATE 10 MG/ML IJ SOLN: 10 mg/mL | INTRAMUSCULAR | Qty: 1

## 2018-03-03 NOTE — ED Notes (Signed)
Patient presents to ED for evaluation of possible lupus flare with generalized pain, worse in abdomen. Pt arrives to triage unable to get out of vehicle due to pain. Pt was assisted by staff into wheelchair.

## 2018-03-03 NOTE — ED Provider Notes (Signed)
ED Provider Notes by Everardo All, PA at 03/03/18 1046                Author: Everardo All, PA  Service: EMERGENCY  Author Type: Physician Assistant       Filed: 03/03/18 1339  Date of Service: 03/03/18 1046  Status: Attested           Editor: Everardo All, PA (Physician Assistant)  Cosigner: Rolena Infante, MD at 03/03/18 1651          Attestation signed by Rolena Infante, MD at 03/03/18 1651          I, Rolena Infante, MD , have personally seen  and examined this patient; I have fully participated in the care of this patient with the advanced practice provider.  I have reviewed and agree with all pertinent clinical information including history, physical exam, labs, radiographic studies and the  plan.  I have also reviewed and agree with the medications, allergies and past medical history sections for this patient.  Patient feels much better after meds, tolerating food without difficulty.  I am comfortable with outpatient follow-up.      Rolena Infante, MD   March 03, 2018                                       [facility] Care   Emergency Department Treatment Report          Patient: [name] [name]  Age: 67 y.o.  Sex: female          Date of Birth: 09-Apr-1951  Admit Date: 03/03/2018  PCP: Coralie Carpen, MD         MRN: [id number]   CSN: [id number]            Room: ER21/ER21  Time Dictated: 10:46 AM          Attending Physician: Rolena Infante, MD    Physician Assistant: Asencion Islam      Chief Complaint      Chief Complaint       Patient presents with        ?  Abdominal Pain        ?  Lupus             History of Present Illness     67 y.o. female  with history of lupus, fibromyalgia, chronic pain syndrome, CAD status post 3 stents, DM, HTN who presents to the ED with multiple complaints including 10 out of 10 chest, abdominal, back pain that started at 2 AM this morning.  She states it feels like  someone is stabbing her chest.  Her abdominal pain is  mainly in the epigastric region and she states it is her pancreatitis.  She had nausea and dry heaving this morning.  She is also had dysuria.  She tried taking her oxycodone at 0845 with no relief.   Denies fevers, chills, diarrhea, hematuria, vaginal bleeding or discharge.      Patient states that she has been having chest pain for the past week and a half.  Her doctor started her on propranolol because of this pain.      She states this feels like a lupus flare.  She states that she just takes pain medicine for her lupus.  She cannot recall her last flareup.  Review of Systems     Constitutional: No fever or chills   Eyes: No visual symptoms   ENT: No sore throat, runny nose, or other URI symptoms   Respiratory: No cough or shortness or breath   Cardiovascular: As noted above   Gastrointestinal: As noted above   Genitourinary: As noted above   Musculoskeletal: No swelling   Integumentary: No rashes   Neurological: No headaches   Psychiatric: No HI or SI     Past Medical/Surgical History          Past Medical History:        Diagnosis  Date         ?  Acute hypercapnic respiratory failure (HCC)  02/05/2013          W/ Encephalopathy requiring BiPAP, Etiology Uncertain however Possible Narcotic Benzodiazipine Related?          ?  Adjustment disorder with mixed anxiety and depressed mood       ?  Aneurysm of internal carotid artery       ?  Arthritis of knee       ?  Asthma       ?  Autoimmune disease (HCC)       ?  CAD (coronary artery disease)       ?  Cervicalgia       ?  Chest pain  03/17/2014          Dobutamine Stress Echo: NORMAL WALL MOTION AND GLOBAL SYSTOLIC FUNCTION AT REST AND AFTER?? STRESS/EXERCISE WITH APPROPRIATE AUGMENTATION OF FUNCTION  IN ALL SEGMENTS. NO EVIDENCE OF INDUCIBLE ISCHEMIA AT ADEQUATE HEART RATE WITH DOBUTAMINE STRESS.??                  ?  Chronic low back pain  10/22/2014          [facility] MRI Lumbar w/o contrast: Moderate facet arthropathy at L4-L5 and L5-S1. Mild/moderate  foraminal stenosis at these levels. Disc bulge with annular  tear at L4-L5.          ?  Chronic pain syndrome       ?  Coccydynia       ?  Diabetes mellitus            NIDDM         ?  DVT (deep venous thrombosis) (HCC)  05/19/2007          [facility] CTA: 1. Small nonocclusive pulmonary embolus in a subsegmental branch to the left lower lobe. Larger filling defect in a segmental branch to  the right lower lobe, not as low in attenuation as is normally seen with a pulmonary embolus, however is seen in multiple planes and remains concerning for an additional nonocclusive pulmonary embolus.         ?  Fibromyalgia       ?  Gastric ulcer       ?  Gastritis  01/03/2015          [facility] Admission, EGD + H. Pylori/Gastritis         ?  Generalized osteoarthritis of multiple sites       ?  GI bleed  01/01/2015          [facility] Admit 5/2-01/07/2015         ?  H. pylori infection  01/03/2015          [facility] Admission, EGD + H. Pylori/Gastritis         ?  Hemiparesis, right (HCC)       ?  Hyperlipidemia       ?  Hypertension       ?  Irregular sleep-wake rhythm       ?  Knee joint pain       ?  Lumbar spondylosis            Orthopedic Spine Surgeon: Dr. Ree KidaJack L. Henrene HawkingSiegel          ?  Muscle spasm       ?  Normocytic anemia            Chronic Iron Deficiency Anemia         ?  Osteoporosis       ?  Pancreatitis       ?  Peripheral neuropathy       ?  Pulmonary embolism (HCC)            Multiple         ?  Seizures (HCC)       ?  Sensory ataxia       ?  SLE (systemic lupus erythematosus) (HCC)       ?  Stroke [facility](HCC)       ?  Subclinical hypothyroidism       ?  Vertigo           ?  Vitamin D deficiency            Past Surgical History:         Procedure  Laterality  Date          ?  CARDIAC SURG PROCEDURE UNLIST              Cardiac Cath PCI w/ Stents          ?  HX CHOLECYSTECTOMY         ?  HX GI              Partial Gastrectomy           ?  HX GI    01/03/2015          [facility] EGD by Dr. Smitty CordsBruce D. Waldholtz: S/P B-2 Gastric Resection, Gastritis in  small remnant, Bx of Jejunum r/o Celiac Disease and of Stomach. + Gastritis  + H. Pylori          ?  HX HYSTERECTOMY                 Social History          Social History          Socioeconomic History         ?  Marital status:  DIVORCED              Spouse name:  Not on file         ?  Number of children:  Not on file     ?  Years of education:  Not on file     ?  Highest education level:  Not on file       Tobacco Use         ?  Smoking status:  Former Smoker     ?  Smokeless tobacco:  Never Used       Substance and Sexual Activity         ?  Alcohol use:  No     ?  Drug use:  No     ?  Sexual activity:  Not Currently              Partners:  Male             Family History          Family History         Problem  Relation  Age of Onset          ?  Diabetes  Maternal Grandmother       ?  Heart Disease  Maternal Grandmother            ?  Heart Disease  Mother               Home Medications          Prior to Admission Medications     Prescriptions  Last Dose  Informant  Patient Reported?  Taking?      Cholecalciferol, Vitamin D3, (VITAMIN D) 1,000 unit Cap      Yes  No      Sig: Take 50,000 Units by mouth every seven (7) days.      apixaban (ELIQUIS) 5 mg (74 tabs) starter pack      No  No      Sig: Take 10 mg (two 5 mg tablets) by mouth twice a day for 7 days    Followed by 5 mg (one 5 mg tablet) by mouth twice a day      ascorbic acid, vitamin C, (VITAMIN C) 250 mg tablet      No  No      Sig: Take 1 Tab by mouth two (2) times a day. Take with iron pill      cyclobenzaprine (FLEXERIL) 10 mg tablet      Yes  No      Sig: Take 10 mg by mouth three (3) times daily as needed for Muscle Spasm(s).      lamoTRIgine (LAMICTAL) 100 mg tablet      No  No      Sig: Take 1 Tab by mouth two (2) times a day.      omeprazole (PRILOSEC) 20 mg capsule      Yes  No      Sig: Take 20 mg by mouth daily.      ondansetron hcl (ZOFRAN) 4 mg tablet      No  No      Sig: Take 1 Tab by mouth every eight (8) hours as needed for Nausea.       oxyCODONE IR (OXY-IR) 15 mg immediate release tablet      No  No      Sig: Take 1 Tab by mouth four (4) times daily. Max Daily Amount: 60 mg.      simvastatin (ZOCOR) 80 mg tablet      Yes  No      Sig: Take 80 mg by mouth nightly.      sucralfate (CARAFATE) 100 mg/mL suspension      Yes  No      Sig: Take 1 tsp by mouth two (2) times a day.               Facility-Administered Medications: None             Allergies          Allergies        Allergen  Reactions         ?  Tape [Adhesive]  Itching  Paper tape             ?  Aspirin  Not Reported This Time             Because of hx ulcers per patient         ?  Opana [Oxymorphone]  Rash and Itching         ?  Tylenol [Acetaminophen]  Itching             Physical Exam        Visit Vitals      BP  123/81 (BP 1 Location: Right arm, BP Patient Position: At rest)     Pulse  66     Temp  97.9 ??F (36.6 ??C)     Resp  18     Ht  5\' 7"  (1.702 m)     Wt  69.4 kg (153 lb)     SpO2  95%        BMI  23.96 kg/m??        General appearance: Well developed, well nourished  female  writhing in bed and tearful.  Family at bedside   Eyes: Conjunctivae clear, non-icteric   ENT: Mouth/throat: surfaces  of the pharynx, palate, and tongue are pink, moist, and without lesions   Respiratory: Clear to auscultation bilaterally   Cardiovascular: Regular, rate and rhythm   GI: Abdomen soft, nondistended, diffusely tender with palpation.  No rebound or guarding.   Musculoskeletal: Patient able to move all four extremities. No peripheral edema   Skin: Warm and dry without rashes   Neurologic: Alert, oriented. Answers questions appropriately        Impression and Management Plan     This is a 67 y.o.  female with history of lupus, fibromyalgia, chronic pain syndrome, CAD status post 3 stents, DM, HTN who presents to the ED with several complaints including chest, abdominal, back pain.  Will obtain  appropriate labs, chest x-ray, and treat her symptomatically.        Diagnostic Studies      Lab:      Labs Reviewed       CBC WITH AUTOMATED DIFF - Abnormal; Notable for the following components:            Result  Value            HGB  8.8 (*)         HCT  30.6 (*)         MCV  72.0 (*)         MCH  20.7 (*)         MCHC  28.8 (*)         RDW-SD  51.1 (*)            All other components within normal limits       METABOLIC PANEL, COMPREHENSIVE - Abnormal; Notable for the following components:            Chloride  109 (*)            All other components within normal limits       TROPONIN I       LIPASE           EKG: As read by Dr. Jama Flavors, shows normal sinus rhythm, rate 70, intervals within normal limits, no evidence of acute ischemia      Imaging:     Xr Chest Pa Lat  Result Date: 03/03/2018   EXAM: Frontal and lateral views of the chest. INDICATIONS: Chest pain COMPARISON: Chest CT dated 11/15/2017       Impression: A 1 cm right upper lobe nodule. This may be better characterized with dedicated nonemergent/outpatient CT. Lungs, otherwise clear. Cardiomediastinal silhouette is unchanged in size and contour.         ED Course     Patient remained stable in the ER.  Discussed results with patient.  CBC with no leukocytosis and chronic anemia.  CMP and lipase unremarkable.  EKG and initial troponin  unremarkable for ACS.  Low suspicion that her pain is cardiac in nature.  This is chronic.  Chest x-ray results as noted above.  Patient with a benign abdominal exam.  Did not feel she required any further work-up.        Patient received symptomatic treatment.  She was completely pain-free.  She was tolerating p.o. intake prior to discharge.  Instructed her to follow-up with Dr. Liz Beach.  She can continue on her home medications including her pain medication. Return  to the ER if condition worsens or new symptoms develop. She agreed to the plan and all of her questions were answered        Medications       diphenhydrAMINE (BENADRYL) injection 25 mg (25 mg IntraVENous Given 03/03/18 1122)     haloperidol  lactate (HALDOL) injection 2.5 mg (2.5 mg IntraVENous Given 03/03/18 1121)     sodium chloride 0.9 % bolus infusion 1,000 mL (0 mL IntraVENous IV Completed 03/03/18 1327)       dexamethasone (DECADRON) injection 10 mg (10 mg IntraVENous Given 03/03/18 1122)          Final Diagnosis                 ICD-10-CM  ICD-9-CM          1.  Chronic chest pain  R07.9  786.50           G89.29  338.29          2.  Abdominal pain, generalized  R10.84  789.07     3.  Chronic bilateral thoracic back pain  M54.6  724.1           G89.29  338.29          Disposition     Patient discharged home in stable condition with the instructions stated above.             The patient was personally evaluated by myself and MANOLIO, Ferdinand Lango, MD who agrees with the above assessment and plan         Asencion Islam, PA-C   March 03, 2018      [facility] medical dictation software was used for portions of this report. Unintended errors may occur.       My signature above authenticates this document and my orders, the final ??   diagnosis (es), discharge prescription (s), and instructions in the Epic ??   record.   If you have any questions please contact [phone].   ??   Nursing notes have been reviewed by the physician/ advanced practice ??   Clinician.

## 2018-03-03 NOTE — ED Notes (Signed)
1:28 PM  03/03/18     Discharge instructions given to patient (name) with verbalization of understanding. Patient accompanied by family.  Patient discharged with the following prescriptions none. Patient discharged to home (destination).      Ocie Doyne, RN

## 2018-03-03 NOTE — ED Provider Notes (Signed)
[facility]  Emergency Department Treatment Report    Patient: [name] CourseVeronica E Amore Age: 67 y.o. Sex: female    Date of Birth: 07/04/1951 Admit Date: 03/03/2018 PCP: [name], [name], MD   MRN: [id number]  CSN: [id number]     Room: ER21/ER21 Time Dictated: 10:46 AM      Attending Physician: Rolena InfanteMANOLIO, RICHARD L, MD   Physician Assistant: Asencion IslamAnnalyn Mosetta Ferdinand    Chief Complaint   Chief Complaint   Patient presents with   ??? Abdominal Pain   ??? Lupus       History of Present Illness   67 y.o. female with history of lupus, fibromyalgia, chronic pain syndrome, CAD status post 3 stents, DM, HTN who presents to the ED with multiple complaints including 10 out of 10 chest, abdominal, back pain that started at 2 AM this morning.  She states it feels like someone is stabbing her chest.  Her abdominal pain is mainly in the epigastric region and she states it is her pancreatitis.  She had nausea and dry heaving this morning.  She is also had dysuria.  She tried taking her oxycodone at 0845 with no relief.  Denies fevers, chills, diarrhea, hematuria, vaginal bleeding or discharge.    Patient states that she has been having chest pain for the past week and a half.  Her doctor started her on propranolol because of this pain.    She states this feels like a lupus flare.  She states that she just takes pain medicine for her lupus.  She cannot recall her last flareup.    Review of Systems   Constitutional: No fever or chills  Eyes: No visual symptoms  ENT: No sore throat, runny nose, or other URI symptoms  Respiratory: No cough or shortness or breath  Cardiovascular: As noted above  Gastrointestinal: As noted above  Genitourinary: As noted above  Musculoskeletal: No swelling  Integumentary: No rashes  Neurological: No headaches  Psychiatric: No HI or SI  Past Medical/Surgical History     Past Medical History:   Diagnosis Date   ??? Acute hypercapnic respiratory failure (HCC) 02/05/2013     W/ Encephalopathy requiring BiPAP, Etiology Uncertain however Possible Narcotic Benzodiazipine Related?    ??? Adjustment disorder with mixed anxiety and depressed mood    ??? Aneurysm of internal carotid artery    ??? Arthritis of knee    ??? Asthma    ??? Autoimmune disease (HCC)    ??? CAD (coronary artery disease)    ??? Cervicalgia    ??? Chest pain 03/17/2014    Dobutamine Stress Echo: NORMAL WALL MOTION AND GLOBAL SYSTOLIC FUNCTION AT REST AND AFTER?? STRESS/EXERCISE WITH APPROPRIATE AUGMENTATION OF FUNCTION IN ALL SEGMENTS. NO EVIDENCE OF INDUCIBLE ISCHEMIA AT ADEQUATE HEART RATE WITH DOBUTAMINE STRESS.??            ??? Chronic low back pain 10/22/2014    [facility]: Moderate facet arthropathy at L4-L5 and L5-S1. Mild/moderate foraminal stenosis at these levels. Disc bulge with annular tear at L4-L5.    ??? Chronic pain syndrome    ??? Coccydynia    ??? Diabetes mellitus     NIDDM   ??? DVT (deep venous thrombosis) (HCC) 05/19/2007    [facility] CTA: 1. Small nonocclusive pulmonary embolus in a subsegmental branch to the left lower lobe. Larger filling defect in a segmental branch to the right lower lobe, not as low in attenuation as is normally seen with a pulmonary embolus, however  is seen in multiple planes and remains concerning for an additional nonocclusive pulmonary embolus.   ??? Fibromyalgia    ??? Gastric ulcer    ??? Gastritis 01/03/2015    [facility] Admission, EGD + H. Pylori/Gastritis   ??? Generalized osteoarthritis of multiple sites    ??? GI bleed 01/01/2015    [facility] Admit 5/2-01/07/2015   ??? H. pylori infection 01/03/2015    [facility] Admission, EGD + H. Pylori/Gastritis   ??? Hemiparesis, right (HCC)    ??? Hyperlipidemia    ??? Hypertension    ??? Irregular sleep-wake rhythm    ??? Knee joint pain    ??? Lumbar spondylosis     Orthopedic Spine Surgeon: Dr. Ree Kida L. Henrene Hawking    ??? Muscle spasm    ??? Normocytic anemia     Chronic Iron Deficiency Anemia   ??? Osteoporosis    ??? Pancreatitis    ??? Peripheral neuropathy    ??? Pulmonary embolism (HCC)      Multiple   ??? Seizures (HCC)    ??? Sensory ataxia    ??? SLE (systemic lupus erythematosus) (HCC)    ??? Stroke [facility])    ??? Subclinical hypothyroidism    ??? Vertigo    ??? Vitamin D deficiency      Past Surgical History:   Procedure Laterality Date   ??? CARDIAC SURG PROCEDURE UNLIST      Cardiac Cath PCI w/ Stents   ??? HX CHOLECYSTECTOMY     ??? HX GI      Partial Gastrectomy    ??? HX GI  01/03/2015    [facility] EGD by Dr. Smitty Cords D. Waldholtz: S/P B-2 Gastric Resection, Gastritis in small remnant, Bx of Jejunum r/o Celiac Disease and of Stomach. + Gastritis + H. Pylori   ??? HX HYSTERECTOMY         Social History     Social History     Socioeconomic History   ??? Marital status: DIVORCED     Spouse name: Not on file   ??? Number of children: Not on file   ??? Years of education: Not on file   ??? Highest education level: Not on file   Tobacco Use   ??? Smoking status: Former Smoker   ??? Smokeless tobacco: Never Used   Substance and Sexual Activity   ??? Alcohol use: No   ??? Drug use: No   ??? Sexual activity: Not Currently     Partners: Male       Family History     Family History   Problem Relation Age of Onset   ??? Diabetes Maternal Grandmother    ??? Heart Disease Maternal Grandmother    ??? Heart Disease Mother        Home Medications     Prior to Admission Medications   Prescriptions Last Dose Informant Patient Reported? Taking?   Cholecalciferol, Vitamin D3, (VITAMIN D) 1,000 unit Cap   Yes No   Sig: Take 50,000 Units by mouth every seven (7) days.   apixaban (ELIQUIS) 5 mg (74 tabs) starter pack   No No   Sig: Take 10 mg (two 5 mg tablets) by mouth twice a day for 7 days   Followed by 5 mg (one 5 mg tablet) by mouth twice a day   ascorbic acid, vitamin C, (VITAMIN C) 250 mg tablet   No No   Sig: Take 1 Tab by mouth two (2) times a day. Take with iron pill   cyclobenzaprine (FLEXERIL) 10  mg tablet   Yes No   Sig: Take 10 mg by mouth three (3) times daily as needed for Muscle Spasm(s).   lamoTRIgine (LAMICTAL) 100 mg tablet   No No    Sig: Take 1 Tab by mouth two (2) times a day.   omeprazole (PRILOSEC) 20 mg capsule   Yes No   Sig: Take 20 mg by mouth daily.   ondansetron hcl (ZOFRAN) 4 mg tablet   No No   Sig: Take 1 Tab by mouth every eight (8) hours as needed for Nausea.   oxyCODONE IR (OXY-IR) 15 mg immediate release tablet   No No   Sig: Take 1 Tab by mouth four (4) times daily. Max Daily Amount: 60 mg.   simvastatin (ZOCOR) 80 mg tablet   Yes No   Sig: Take 80 mg by mouth nightly.   sucralfate (CARAFATE) 100 mg/mL suspension   Yes No   Sig: Take 1 tsp by mouth two (2) times a day.      Facility-Administered Medications: None       Allergies     Allergies   Allergen Reactions   ??? Tape [Adhesive] Itching     Paper tape      ??? Aspirin Not Reported This Time     Because of hx ulcers per patient   ??? Opana [Oxymorphone] Rash and Itching   ??? Tylenol [Acetaminophen] Itching       Physical Exam     Visit Vitals  BP 123/81 (BP 1 Location: Right arm, BP Patient Position: At rest)   Pulse 66   Temp 97.9 ??F (36.6 ??C)   Resp 18   Ht 5\' 7"  (1.702 m)   Wt 69.4 kg (153 lb)   SpO2 95%   BMI 23.96 kg/m??     General appearance: Well developed, well nourished  female writhing in bed and tearful.  Family at bedside  Eyes: Conjunctivae clear, non-icteric  ENT: Mouth/throat: surfaces of the pharynx, palate, and tongue are pink, moist, and without lesions  Respiratory: Clear to auscultation bilaterally  Cardiovascular: Regular, rate and rhythm  GI: Abdomen soft, nondistended, diffusely tender with palpation.  No rebound or guarding.  Musculoskeletal: Patient able to move all four extremities. No peripheral edema  Skin: Warm and dry without rashes  Neurologic: Alert, oriented. Answers questions appropriately    Impression and Management Plan   This is a 67 y.o. female with history of lupus, fibromyalgia, chronic pain syndrome, CAD status post 3 stents, DM, HTN who presents to the ED with several complaints including chest, abdominal, back pain.  Will obtain  appropriate labs, chest x-ray, and treat her symptomatically.    Diagnostic Studies   Lab:   Labs Reviewed   CBC WITH AUTOMATED DIFF - Abnormal; Notable for the following components:       Result Value    HGB 8.8 (*)     HCT 30.6 (*)     MCV 72.0 (*)     MCH 20.7 (*)     MCHC 28.8 (*)     RDW-SD 51.1 (*)     All other components within normal limits   METABOLIC PANEL, COMPREHENSIVE - Abnormal; Notable for the following components:    Chloride 109 (*)     All other components within normal limits   TROPONIN I   LIPASE       EKG: As read by Dr. Jama Flavors, shows normal sinus rhythm, rate 70, intervals within normal limits, no evidence of  acute ischemia    Imaging:    Xr Chest Pa Lat    Result Date: 03/03/2018  EXAM: Frontal and lateral views of the chest. INDICATIONS: Chest pain COMPARISON: Chest CT dated 11/15/2017     Impression: A 1 cm right upper lobe nodule. This may be better characterized with dedicated nonemergent/outpatient CT. Lungs, otherwise clear. Cardiomediastinal silhouette is unchanged in size and contour.     ED [name]   Patient remained stable in the ER.  Discussed results with patient.  CBC with no leukocytosis and chronic anemia.  CMP and lipase unremarkable.  EKG and initial troponin unremarkable for ACS.  Low suspicion that her pain is cardiac in nature.  This is chronic.  Chest x-ray results as noted above.  Patient with a benign abdominal exam.  Did not feel she required any further work-up.      Patient received symptomatic treatment.  She was completely pain-free.  She was tolerating p.o. intake prior to discharge.  Instructed her to follow-up with Dr. Liz Beach.  She can continue on her home medications including her pain medication. Return to the ER if condition worsens or new symptoms develop. She agreed to the plan and all of her questions were answered    Medications   diphenhydrAMINE (BENADRYL) injection 25 mg (25 mg IntraVENous Given 03/03/18 1122)    haloperidol lactate (HALDOL) injection 2.5 mg (2.5 mg IntraVENous Given 03/03/18 1121)   sodium chloride 0.9 % bolus infusion 1,000 mL (0 mL IntraVENous IV Completed 03/03/18 1327)   dexamethasone (DECADRON) injection 10 mg (10 mg IntraVENous Given 03/03/18 1122)     Final Diagnosis       ICD-10-CM ICD-9-CM   1. Chronic chest pain R07.9 786.50    G89.29 338.29   2. Abdominal pain, generalized R10.84 789.07   3. Chronic bilateral thoracic back pain M54.6 724.1    G89.29 338.29     Disposition   Patient discharged home in stable condition with the instructions stated above.       The patient was personally evaluated by myself and MANOLIO, Ferdinand Lango, MD who agrees with the above assessment and plan      Asencion Islam, PA-C  March 03, 2018    [facility] medical dictation software was used for portions of this report. Unintended errors may occur.     My signature above authenticates this document and my orders, the final ??  diagnosis (es), discharge prescription (s), and instructions in the Epic ??  record.  If you have any questions please contact [phone].  ??  Nursing notes have been reviewed by the physician/ advanced practice ??  Clinician.

## 2018-03-03 NOTE — ED Triage Notes (Signed)
Patient presents to ED for evaluation of possible lupus flare with generalized pain, worse in abdomen. Pt arrives to triage unable to get out of vehicle due to pain. Pt was assisted by staff into wheelchair.

## 2018-03-03 NOTE — ED Notes (Signed)
1:28 PM  03/03/18     Discharge instructions given to patient (name) with verbalization of understanding. Patient accompanied by family.  Patient discharged with the following prescriptions none. Patient discharged to home (destination).      Brooke Englund, RN

## 2018-03-09 ENCOUNTER — Emergency Department: Admit: 2018-03-10 | Payer: PRIVATE HEALTH INSURANCE | Primary: Internal Medicine

## 2018-03-09 DIAGNOSIS — R1032 Left lower quadrant pain: Secondary | ICD-10-CM

## 2018-03-09 NOTE — ED Notes (Signed)
Now pt states [facility] into [facility] cart today [facility] took pain meds fell asleep and now woke up with pain

## 2018-03-09 NOTE — ED Provider Notes (Signed)
ED Provider Notes by Dorise Hiss, PA at 03/09/18 2254                Author: Dorise Hiss, PA  Service: Emergency Medicine  Author Type: Physician Assistant       Filed: 03/10/18 0107  Date of Service: 03/09/18 2254  Status: Attested           Editor: Dorise Hiss, PA (Physician Assistant)  Cosigner: Gwenyth Allegra, MD at 03/10/18 [phone]          Attestation signed by Gwenyth Allegra, MD at 03/10/18 [phone]          I interviewed and examined the patient. I discussed the patient with the advanced practice provider, Farrel Demark, and I agree with the evaluation and plan as  documented here.      On my exam the patient has some tenderness to palpation in her left groin.  No palpable hematoma.  I reviewed the x-rays myself.  I see no acute bony process.  I think this is most likely contusion.  I did warn the patient that since she is on Eliquis  that should be more likely to form a large bruise.  I think she is safe for discharge and I agree with the documentation as detailed below.                                       [facility] Care   Emergency Department Treatment Report                Patient: [name] [name]  Age: 67 y.o.  Sex: female          Date of Birth: July 19, 1951  Admit Date: 03/09/2018  PCP: Coralie Carpen, MD     MRN: [id number]   CSN: [id number]   Attending: Gwenyth Allegra, MD         Room: 113/EO13  Time Dictated: 10:54 PM  [id number]:[id number]           Chief Complaint    Left groin pain        History of Present Illness     67 y.o. female  complaining of pain left groin which started today.  She states that she was pushing a shopping cart and it would not move correctly and the cart went one way and she went the other and struck her left groin on a shelf at a store.  She has some pain  in left groin which began radiating to the left upper thigh a few hours after.  It is worse with ambulation.  No associated vomiting.  He rates the pain as an 8 on a scale of 1-10.  She took her  dual chronic pain medication prior to coming to the ED with  some improvement.  Patient states she was concerned about making sure that she did not have a blood clot.        Review of Systems     Review of Systems    Constitutional: Negative for fever.    HENT: Negative for congestion.     Eyes: Negative for discharge and redness.    Respiratory: Negative for wheezing.     Cardiovascular: Negative for leg swelling.    Gastrointestinal: Negative for vomiting.         Pain left groin    Genitourinary:  No hematuria    Musculoskeletal:         History of chronic back pain    Skin:         No bruising to the groin    Neurological: Negative for loss of consciousness.            Past Medical/Surgical History          Past Medical History:        Diagnosis  Date         ?  Acute hypercapnic respiratory failure (HCC)  02/05/2013          W/ Encephalopathy requiring BiPAP, Etiology Uncertain however Possible Narcotic Benzodiazipine Related?          ?  Adjustment disorder with mixed anxiety and depressed mood       ?  Aneurysm of internal carotid artery       ?  Arthritis of knee       ?  Asthma       ?  Autoimmune disease (HCC)       ?  CAD (coronary artery disease)       ?  Cervicalgia       ?  Chest pain  03/17/2014          Dobutamine Stress Echo: NORMAL WALL MOTION AND GLOBAL SYSTOLIC FUNCTION AT REST AND AFTER?? STRESS/EXERCISE WITH APPROPRIATE AUGMENTATION OF FUNCTION  IN ALL SEGMENTS. NO EVIDENCE OF INDUCIBLE ISCHEMIA AT ADEQUATE HEART RATE WITH DOBUTAMINE STRESS.??                  ?  Chronic low back pain  10/22/2014          [facility] MRI Lumbar w/o contrast: Moderate facet arthropathy at L4-L5 and L5-S1. Mild/moderate foraminal stenosis at these levels. Disc bulge with annular  tear at L4-L5.          ?  Chronic pain syndrome       ?  Coccydynia       ?  Diabetes mellitus            NIDDM         ?  DVT (deep venous thrombosis) (HCC)  05/19/2007          [facility] CTA: 1. Small nonocclusive pulmonary embolus in a  subsegmental branch to the left lower lobe. Larger filling defect in a segmental branch to  the right lower lobe, not as low in attenuation as is normally seen with a pulmonary embolus, however is seen in multiple planes and remains concerning for an additional nonocclusive pulmonary embolus.         ?  Fibromyalgia       ?  Gastric ulcer       ?  Gastritis  01/03/2015          [facility] Admission, EGD + H. Pylori/Gastritis         ?  Generalized osteoarthritis of multiple sites       ?  GI bleed  01/01/2015          [facility] Admit 5/2-01/07/2015         ?  H. pylori infection  01/03/2015          [facility] Admission, EGD + H. Pylori/Gastritis         ?  Hemiparesis, right (HCC)       ?  Hyperlipidemia       ?  Hypertension       ?  Irregular sleep-wake rhythm       ?  Knee joint pain       ?  Lumbar spondylosis            Orthopedic Spine Surgeon: Dr. Ree Kida L. Henrene Hawking          ?  Muscle spasm       ?  Normocytic anemia            Chronic Iron Deficiency Anemia         ?  Osteoporosis       ?  Pancreatitis       ?  Peripheral neuropathy       ?  Pulmonary embolism (HCC)            Multiple         ?  Seizures (HCC)       ?  Sensory ataxia       ?  SLE (systemic lupus erythematosus) (HCC)       ?  Stroke [facility])       ?  Subclinical hypothyroidism       ?  Vertigo           ?  Vitamin D deficiency            Past Surgical History:         Procedure  Laterality  Date          ?  CARDIAC SURG PROCEDURE UNLIST              Cardiac Cath PCI w/ Stents          ?  HX CHOLECYSTECTOMY         ?  HX GI              Partial Gastrectomy           ?  HX GI    01/03/2015          [facility] EGD by Dr. Smitty Cords D. Waldholtz: S/P B-2 Gastric Resection, Gastritis in small remnant, Bx of Jejunum r/o Celiac Disease and of Stomach. + Gastritis  + H. Pylori          ?  HX HYSTERECTOMY                 Social History          Social History          Socioeconomic History         ?  Marital status:  DIVORCED              Spouse name:  Not on file         ?  Number of  children:  Not on file     ?  Years of education:  Not on file     ?  Highest education level:  Not on file       Occupational History        ?  Not on file       Social Needs         ?  Financial resource strain:  Not on file        ?  Food insecurity:              Worry:  Not on file         Inability:  Not on file        ?  Transportation needs:  Medical:  Not on file         Non-medical:  Not on file       Tobacco Use         ?  Smoking status:  Former Smoker     ?  Smokeless tobacco:  Never Used       Substance and Sexual Activity         ?  Alcohol use:  No     ?  Drug use:  No     ?  Sexual activity:  Not Currently              Partners:  Male       Lifestyle        ?  Physical activity:              Days per week:  Not on file         Minutes per session:  Not on file         ?  Stress:  Not on file       Relationships        ?  Social connections:              Talks on phone:  Not on file         Gets together:  Not on file         Attends religious service:  Not on file         Active member of club or organization:  Not on file         Attends meetings of clubs or organizations:  Not on file         Relationship status:  Not on file        ?  Intimate partner violence:              Fear of current or ex partner:  Not on file         Emotionally abused:  Not on file         Physically abused:  Not on file         Forced sexual activity:  Not on file        Other Topics  Concern        ?  Not on file       Social History Narrative        ?  Not on file             Family History          Family History         Problem  Relation  Age of Onset          ?  Diabetes  Maternal Grandmother       ?  Heart Disease  Maternal Grandmother            ?  Heart Disease  Mother               Current Medications          Prior to Admission Medications     Prescriptions  Last Dose  Informant  Patient Reported?  Taking?      Cholecalciferol, Vitamin D3, (VITAMIN D) 1,000 unit Cap      Yes  No      Sig: Take  50,000 Units by mouth every Sunday.      apixaban (ELIQUIS) 5 mg (74 tabs) starter pack  No  No      Sig: Take 10 mg (two 5 mg tablets) by mouth twice a day for 7 days    Followed by 5 mg (one 5 mg tablet) by mouth twice a day      ascorbic acid, vitamin C, (VITAMIN C) 250 mg tablet      No  No      Sig: Take 1 Tab by mouth two (2) times a day. Take with iron pill      calcium carbonate (CALTREX) 600 mg calcium (1,500 mg) tablet      Yes  No      Sig: Take 600 mg by mouth daily.      cyclobenzaprine (FLEXERIL) 10 mg tablet      Yes  No      Sig: Take 10 mg by mouth three (3) times daily as needed for Muscle Spasm(s).      lamoTRIgine (LAMICTAL) 100 mg tablet      No  No      Sig: Take 1 Tab by mouth two (2) times a day.      omeprazole (PRILOSEC) 20 mg capsule      Yes  No      Sig: Take 20 mg by mouth daily.      ondansetron hcl (ZOFRAN) 4 mg tablet      No  No      Sig: Take 1 Tab by mouth every eight (8) hours as needed for Nausea.      oxyCODONE IR (OXY-IR) 15 mg immediate release tablet      No  No      Sig: Take 1 Tab by mouth four (4) times daily. Max Daily Amount: 60 mg.      propranolol (INDERAL) 40 mg tablet      Yes  No      Sig: Take 40 mg by mouth two (2) times a day.      simvastatin (ZOCOR) 80 mg tablet      Yes  No      Sig: Take 80 mg by mouth nightly.      sucralfate (CARAFATE) 100 mg/mL suspension      Yes  No      Sig: Take 1 tsp by mouth two (2) times a day.               Facility-Administered Medications: None             Allergies          Allergies        Allergen  Reactions         ?  Tape [Adhesive]  Itching             Paper tape             ?  Aspirin  Not Reported This Time             Because of hx ulcers per patient         ?  Opana [Oxymorphone]  Rash and Itching         ?  Tylenol [Acetaminophen]  Itching             Physical Exam          ED Triage Vitals     ED Encounter Vitals Group            BP  03/09/18 2048  136/73        Pulse (  Heart Rate)  03/09/18 2045  84        Resp  Rate  03/09/18 2045  18        Temp  03/09/18 2045  98 ??F (36.7 ??C)        Temp src  --          O2 Sat (%)  03/09/18 2045  99 %        Weight  03/09/18 2045  152 lb            Height  --          Physical Exam    Constitutional: She is oriented to person, place, and time.   General appearance: Patient appears well-developed and well-nourished.     HENT:    Head: Normocephalic and atraumatic.    Mouth/Throat: Oropharynx is clear and moist.    Eyes: Conjunctivae are normal.    Neck: Neck supple.    Cardiovascular: Normal rate, regular rhythm and normal heart sounds.    No murmur heard.   Pulmonary/Chest: Effort normal. No respiratory distress. She has no wheezes.    Abdominal: Soft.   No abdominal tenderness of bruising.  Some tenderness noted left inguinal region with no hematoma noted.   Musculoskeletal:  She exhibits no edema.   Neurological: She is alert and oriented to person, place, and time.    Skin: Skin is warm and dry. No rash noted.    Vitals reviewed.            Impression and Management Plan     Patient has some left inguinal tenderness with history of trauma we will obtain plain film to evaluate for any bony abnormality.        Diagnostic Studies     X-ray pelvis negative per ED attending     ED Course     I offered to obtain labs and patient rather not have labs drawn and as I have a low clinical suspicion for any if he cannot bleeding do not feel that it is needed.   Medications - No data to display         Medical Decision Making     Hematoma present.  Patient compliant with Eliquis.  Low likelihood of her injury having caused an acute DVT.        Final Diagnosis                 ICD-10-CM  ICD-9-CM          1.  Left groin pain  R10.32  789.09             Disposition     No findings of hematoma or signs worrisome for infection or acute DVT   Dicharge home      Current Discharge Medication List                  The patient was personally evaluated by myself and Dr. Gwenyth Allegra, MD who agrees with  the above assessment and plan.      Farrel Demark, PA-C   March 10, 2018      My signature above authenticates this document and my orders, the final ??   diagnosis (es), discharge prescription (s), and instructions in the Epic ??   record.   If you have any questions please contact [phone].   ??   Nursing notes have been reviewed by the physician/ advanced practice ??   Clinician.  Dragon medical dictation software was used for portions of this report. Unintended voice recognition errors may occur.

## 2018-03-09 NOTE — ED Provider Notes (Signed)
[facility] Care  Emergency Department Treatment Report        Patient: [name] [name] Age: 67 y.o. Sex: female    Date of Birth: 06/03/1951 Admit Date: 03/09/2018 PCP: Coralie Carpen, MD   MRN: [id number]  CSN: [id number]  Attending: Gwenyth Allegra, MD   Room: 113/EO13 Time Dictated: 10:54 PM [id number]       Chief Complaint   Left groin pain    History of Present Illness   67 y.o. female complaining of pain left groin which started today.  She states that she was pushing a shopping cart and it would not move correctly and the cart went one way and she went the other and struck her left groin on a shelf at a store.  She has some pain in left groin which began radiating to the left upper thigh a few hours after.  It is worse with ambulation.  No associated vomiting.  He rates the pain as an 8 on a scale of 1-10.  She took her dual chronic pain medication prior to coming to the ED with some improvement.  Patient states she was concerned about making sure that she did not have a blood clot.    Review of Systems   Review of Systems   Constitutional: Negative for fever.   HENT: Negative for congestion.    Eyes: Negative for discharge and redness.   Respiratory: Negative for wheezing.    Cardiovascular: Negative for leg swelling.   Gastrointestinal: Negative for vomiting.        Pain left groin   Genitourinary:        No hematuria   Musculoskeletal:        History of chronic back pain   Skin:        No bruising to the groin   Neurological: Negative for loss of consciousness.       Past Medical/Surgical History     Past Medical History:   Diagnosis Date   ??? Acute hypercapnic respiratory failure (HCC) 02/05/2013    W/ Encephalopathy requiring BiPAP, Etiology Uncertain however Possible Narcotic Benzodiazipine Related?    ??? Adjustment disorder with mixed anxiety and depressed mood    ??? Aneurysm of internal carotid artery    ??? Arthritis of knee    ??? Asthma    ??? Autoimmune disease (HCC)     ??? CAD (coronary artery disease)    ??? Cervicalgia    ??? Chest pain 03/17/2014    Dobutamine Stress Echo: NORMAL WALL MOTION AND GLOBAL SYSTOLIC FUNCTION AT REST AND AFTER?? STRESS/EXERCISE WITH APPROPRIATE AUGMENTATION OF FUNCTION IN ALL SEGMENTS. NO EVIDENCE OF INDUCIBLE ISCHEMIA AT ADEQUATE HEART RATE WITH DOBUTAMINE STRESS.??            ??? Chronic low back pain 10/22/2014    [facility] MRI Lumbar w/o contrast: Moderate facet arthropathy at L4-L5 and L5-S1. Mild/moderate foraminal stenosis at these levels. Disc bulge with annular tear at L4-L5.    ??? Chronic pain syndrome    ??? Coccydynia    ??? Diabetes mellitus     NIDDM   ??? DVT (deep venous thrombosis) (HCC) 05/19/2007    [facility] CTA: 1. Small nonocclusive pulmonary embolus in a subsegmental branch to the left lower lobe. Larger filling defect in a segmental branch to the right lower lobe, not as low in attenuation as is normally seen with a pulmonary embolus, however is seen in multiple planes and remains concerning for an additional nonocclusive pulmonary embolus.   ???  Fibromyalgia    ??? Gastric ulcer    ??? Gastritis 01/03/2015    [facility] Admission, EGD + H. Pylori/Gastritis   ??? Generalized osteoarthritis of multiple sites    ??? GI bleed 01/01/2015    [facility] Admit 5/2-01/07/2015   ??? H. pylori infection 01/03/2015    [facility] Admission, EGD + H. Pylori/Gastritis   ??? Hemiparesis, right (HCC)    ??? Hyperlipidemia    ??? Hypertension    ??? Irregular sleep-wake rhythm    ??? Knee joint pain    ??? Lumbar spondylosis     Orthopedic Spine Surgeon: Dr. Ree Kida L. Henrene Hawking    ??? Muscle spasm    ??? Normocytic anemia     Chronic Iron Deficiency Anemia   ??? Osteoporosis    ??? Pancreatitis    ??? Peripheral neuropathy    ??? Pulmonary embolism (HCC)     Multiple   ??? Seizures (HCC)    ??? Sensory ataxia    ??? SLE (systemic lupus erythematosus) (HCC)    ??? Stroke [facility])    ??? Subclinical hypothyroidism    ??? Vertigo    ??? Vitamin D deficiency      Past Surgical History:   Procedure Laterality Date   ??? CARDIAC SURG PROCEDURE UNLIST       Cardiac Cath PCI w/ Stents   ??? HX CHOLECYSTECTOMY     ??? HX GI      Partial Gastrectomy    ??? HX GI  01/03/2015    [facility] EGD by Dr. Smitty Cords D. Waldholtz: S/P B-2 Gastric Resection, Gastritis in small remnant, Bx of Jejunum r/o Celiac Disease and of Stomach. + Gastritis + H. Pylori   ??? HX HYSTERECTOMY         Social History     Social History     Socioeconomic History   ??? Marital status: DIVORCED     Spouse name: Not on file   ??? Number of children: Not on file   ??? Years of education: Not on file   ??? Highest education level: Not on file   Occupational History   ??? Not on file   Social Needs   ??? Financial resource strain: Not on file   ??? Food insecurity:     Worry: Not on file     Inability: Not on file   ??? Transportation needs:     Medical: Not on file     Non-medical: Not on file   Tobacco Use   ??? Smoking status: Former Smoker   ??? Smokeless tobacco: Never Used   Substance and Sexual Activity   ??? Alcohol use: No   ??? Drug use: No   ??? Sexual activity: Not Currently     Partners: Male   Lifestyle   ??? Physical activity:     Days per week: Not on file     Minutes per session: Not on file   ??? Stress: Not on file   Relationships   ??? Social connections:     Talks on phone: Not on file     Gets together: Not on file     Attends religious service: Not on file     Active member of club or organization: Not on file     Attends meetings of clubs or organizations: Not on file     Relationship status: Not on file   ??? Intimate partner violence:     Fear of current or ex partner: Not on file  Emotionally abused: Not on file     Physically abused: Not on file     Forced sexual activity: Not on file   Other Topics Concern   ??? Not on file   Social History Narrative   ??? Not on file       Family History     Family History   Problem Relation Age of Onset   ??? Diabetes Maternal Grandmother    ??? Heart Disease Maternal Grandmother    ??? Heart Disease Mother        Current Medications     Prior to Admission Medications    Prescriptions Last Dose Informant Patient Reported? Taking?   Cholecalciferol, Vitamin D3, (VITAMIN D) 1,000 unit Cap   Yes No   Sig: Take 50,000 Units by mouth every Sunday.   apixaban (ELIQUIS) 5 mg (74 tabs) starter pack   No No   Sig: Take 10 mg (two 5 mg tablets) by mouth twice a day for 7 days   Followed by 5 mg (one 5 mg tablet) by mouth twice a day   ascorbic acid, vitamin C, (VITAMIN C) 250 mg tablet   No No   Sig: Take 1 Tab by mouth two (2) times a day. Take with iron pill   calcium carbonate (CALTREX) 600 mg calcium (1,500 mg) tablet   Yes No   Sig: Take 600 mg by mouth daily.   cyclobenzaprine (FLEXERIL) 10 mg tablet   Yes No   Sig: Take 10 mg by mouth three (3) times daily as needed for Muscle Spasm(s).   lamoTRIgine (LAMICTAL) 100 mg tablet   No No   Sig: Take 1 Tab by mouth two (2) times a day.   omeprazole (PRILOSEC) 20 mg capsule   Yes No   Sig: Take 20 mg by mouth daily.   ondansetron hcl (ZOFRAN) 4 mg tablet   No No   Sig: Take 1 Tab by mouth every eight (8) hours as needed for Nausea.   oxyCODONE IR (OXY-IR) 15 mg immediate release tablet   No No   Sig: Take 1 Tab by mouth four (4) times daily. Max Daily Amount: 60 mg.   propranolol (INDERAL) 40 mg tablet   Yes No   Sig: Take 40 mg by mouth two (2) times a day.   simvastatin (ZOCOR) 80 mg tablet   Yes No   Sig: Take 80 mg by mouth nightly.   sucralfate (CARAFATE) 100 mg/mL suspension   Yes No   Sig: Take 1 tsp by mouth two (2) times a day.      Facility-Administered Medications: None       Allergies     Allergies   Allergen Reactions   ??? Tape [Adhesive] Itching     Paper tape      ??? Aspirin Not Reported This Time     Because of hx ulcers per patient   ??? Opana [Oxymorphone] Rash and Itching   ??? Tylenol [Acetaminophen] Itching       Physical Exam     ED Triage Vitals   ED Encounter Vitals Group      BP 03/09/18 2048 136/73      Pulse (Heart Rate) 03/09/18 2045 84      Resp Rate 03/09/18 2045 18      Temp 03/09/18 2045 98 ??F (36.7 ??C)       Temp src --       O2 Sat (%) 03/09/18 2045 99 %  Weight 03/09/18 2045 152 lb      Height --      Physical Exam   Constitutional: She is oriented to person, place, and time.   General appearance: Patient appears well-developed and well-nourished.   HENT:   Head: Normocephalic and atraumatic.   Mouth/Throat: Oropharynx is clear and moist.   Eyes: Conjunctivae are normal.   Neck: Neck supple.   Cardiovascular: Normal rate, regular rhythm and normal heart sounds.   No murmur heard.  Pulmonary/Chest: Effort normal. No respiratory distress. She has no wheezes.   Abdominal: Soft.   No abdominal tenderness of bruising.  Some tenderness noted left inguinal region with no hematoma noted.   Musculoskeletal: She exhibits no edema.   Neurological: She is alert and oriented to person, place, and time.   Skin: Skin is warm and dry. No rash noted.   Vitals reviewed.       Impression and Management Plan   Patient has some left inguinal tenderness with history of trauma we will obtain plain film to evaluate for any bony abnormality.    Diagnostic Studies   X-ray pelvis negative per ED attending  ED Course   I offered to obtain labs and patient rather not have labs drawn and as I have a low clinical suspicion for any if he cannot bleeding do not feel that it is needed.  Medications - No data to display     Medical Decision Making   Hematoma present.  Patient compliant with Eliquis.  Low likelihood of her injury having caused an acute DVT.    Final Diagnosis       ICD-10-CM ICD-9-CM   1. Left groin pain R10.32 789.09       Disposition   No findings of hematoma or signs worrisome for infection or acute DVT  Dicharge home   Current Discharge Medication List          The patient was personally evaluated by myself and Dr. Gwenyth AllegraNELSON, TIMOTHY S, MD who agrees with the above assessment and plan.    Farrel Demarkiana Jahid Weida, PA-C  March 10, 2018    My signature above authenticates this document and my orders, the final ??   diagnosis (es), discharge prescription (s), and instructions in the Epic ??  record.  If you have any questions please contact [phone].  ??  Nursing notes have been reviewed by the physician/ advanced practice ??  Clinician.    Dragon medical dictation software was used for portions of this report. Unintended voice recognition errors may occur.

## 2018-03-09 NOTE — ED Notes (Signed)
Now pt states she fell into a shopping cart today and hurt her groin area at noonish she went home took pain meds fell asleep and now woke up with pain

## 2018-03-10 ENCOUNTER — Inpatient Hospital Stay
Admit: 2018-03-10 | Discharge: 2018-03-10 | Disposition: A | Discharge status: Home / Self Care | Payer: PRIVATE HEALTH INSURANCE | Attending: Emergency Medicine

## 2018-09-06 DIAGNOSIS — S90111A Contusion of right great toe without damage to nail, initial encounter: Secondary | ICD-10-CM

## 2018-09-06 NOTE — ED Notes (Signed)
Patient states that she was moving into her new house and tripped and fell on the brick steps out side. She hit her left hand on the brick and has swelling and pain to the hand and fingers. She is on Eliquis.

## 2018-09-06 NOTE — ED Notes (Signed)
Patient states that she was moving into her new house and tripped and fell on the brick steps out side. She hit her left hand on the brick and has swelling and pain to the hand and fingers. She is on Eliquis.

## 2018-09-07 ENCOUNTER — Inpatient Hospital Stay
Admit: 2018-09-07 | Discharge: 2018-09-07 | Disposition: A | Discharge status: Home / Self Care | Payer: PRIVATE HEALTH INSURANCE | Attending: Emergency Medicine

## 2018-09-07 ENCOUNTER — Emergency Department: Admit: 2018-09-07 | Payer: PRIVATE HEALTH INSURANCE | Primary: Internal Medicine

## 2018-09-07 MED ORDER — OXYCODONE 5 MG TAB
5 mg | Freq: Once | ORAL | Status: AC
Start: 2018-09-07 — End: 2018-09-07
  Administered 2018-09-07: 05:00:00 via ORAL

## 2018-09-07 MED FILL — OXYCODONE 5 MG TAB: 5 mg | ORAL | Qty: 3

## 2018-09-07 NOTE — ED Notes (Signed)
2:01 AM  09/07/18     Discharge instructions given to pt with verbalization of understanding. Patient accompanied by family members.  Patient discharged with no prescriptions. Patient discharged to home.      Ta-Hesha Tawanna Hopkins-Collins

## 2018-09-07 NOTE — ED Notes (Signed)
Bedside shift report performed with Mike, RN

## 2018-09-07 NOTE — ED Notes (Signed)
Pt asleep in [place] bed and in no apparent distress. Family member at bedside. No further needs at this time.

## 2018-09-07 NOTE — ED Provider Notes (Signed)
ED Provider Notes by Fara Chute, PA-C at 09/07/18 0019                Author: Fara Chute, PA-C  Service: EMERGENCY  Author Type: Physician Assistant       Filed: 09/07/18 0152  Date of Service: 09/07/18 0019  Status: Attested           Editor: Arvin Collard (Physician Assistant)  Cosigner: Wynelle Bourgeois, MD at 09/07/18 0530          Attestation signed by Wynelle Bourgeois, MD at 09/07/18 0530          Addendum by Dr. Sherlon Handing:  I have personally seen and examined this patient; I have fully participated in the care of this patient with the advanced practice  provider.  I have reviewed and agree with all pertinent clinical information including history, physical exam, labs, radiographic studies and the plan.                                 [facility]   Emergency Department Treatment Report          Patient: [name] [name]  Age: 68 y.o.  Sex: female          Date of Birth: 31-Jul-1951  Admit Date: 09/06/2018  PCP: Coralie Carpen, MD     MRN: [id number]   CSN: [id number]   Attn: Wynelle Bourgeois, MD         Room: H02/H02  Time Dictated: 12:19 AM  APP: Maurice Small. Williams Che, PA-C           Chief Complaint    Fall hand and foot pain     History of Present Illness     68 y.o. female  who presents stating she moved into a new house tripped this evening around 20 100-20 130 going up the steps not sure exactly how she landed but injured her left hand and her right foot primarily her big toe.  She did not hit her head there was no loss  of consciousness she denies any neck back hip chest or abdominal pain.  She does note that she is on Eliquis and so she is always very careful.  She is right-hand dominant.  She is not taking anything for pain at home she presents for evaluation at this  time.  Rates her pain a 10 on a 0-to-10 scale.        Review of Systems     Review of Systems    HENT:         No bleeding ear nose or mouth    Respiratory: Negative for shortness of breath.      Cardiovascular: Negative for chest pain.    Gastrointestinal: Negative for abdominal pain, nausea and vomiting.    Musculoskeletal: Negative for back pain and neck pain.         Left hand and right foot no ankle knee hip back or neck pain    Skin:         No abrasions or laceration    Neurological: Negative for dizziness, sensory change, focal weakness, loss of consciousness and headaches.          No Head injury    All other systems reviewed and are negative.           Past Medical/Surgical History  Past Medical History:        Diagnosis  Date         ?  Acute hypercapnic respiratory failure (HCC)  02/05/2013          W/ Encephalopathy requiring BiPAP, Etiology Uncertain however Possible Narcotic Benzodiazipine Related?          ?  Adjustment disorder with mixed anxiety and depressed mood       ?  Aneurysm of internal carotid artery       ?  Arthritis of knee       ?  Asthma       ?  Autoimmune disease (HCC)       ?  CAD (coronary artery disease)       ?  Cervicalgia       ?  Chest pain  03/17/2014          Dobutamine Stress Echo: NORMAL WALL MOTION AND GLOBAL SYSTOLIC FUNCTION AT REST AND AFTER?? STRESS/EXERCISE WITH APPROPRIATE AUGMENTATION OF FUNCTION  IN ALL SEGMENTS. NO EVIDENCE OF INDUCIBLE ISCHEMIA AT ADEQUATE HEART RATE WITH DOBUTAMINE STRESS.??                  ?  Chronic low back pain  10/22/2014          [facility] MRI Lumbar w/o contrast: Moderate facet arthropathy at L4-L5 and L5-S1. Mild/moderate foraminal stenosis at these levels. Disc bulge with annular  tear at L4-L5.          ?  Chronic pain syndrome       ?  Coccydynia       ?  Diabetes mellitus            NIDDM         ?  DVT (deep venous thrombosis) (HCC)  05/19/2007          [facility] CTA: 1. Small nonocclusive pulmonary embolus in a subsegmental branch to the left lower lobe. Larger filling defect in a segmental branch to  the right lower lobe, not as low in attenuation as is normally seen with a pulmonary embolus, however is seen in multiple  planes and remains concerning for an additional nonocclusive pulmonary embolus.         ?  Fibromyalgia       ?  Gastric ulcer       ?  Gastritis  01/03/2015          [facility] Admission, EGD + H. Pylori/Gastritis         ?  Generalized osteoarthritis of multiple sites       ?  GI bleed  01/01/2015          [facility] Admit 5/2-01/07/2015         ?  H. pylori infection  01/03/2015          [facility] Admission, EGD + H. Pylori/Gastritis         ?  Hemiparesis, right (HCC)       ?  Hyperlipidemia       ?  Hypertension       ?  Irregular sleep-wake rhythm       ?  Knee joint pain       ?  Lumbar spondylosis            Orthopedic Spine Surgeon: Dr. Ree Kida L. Henrene Hawking          ?  Muscle spasm       ?  Normocytic anemia  Chronic Iron Deficiency Anemia         ?  Osteoporosis       ?  Pancreatitis       ?  Peripheral neuropathy       ?  Pulmonary embolism (HCC)            Multiple         ?  Seizures (HCC)       ?  Sensory ataxia       ?  SLE (systemic lupus erythematosus) (HCC)       ?  Stroke [facility])       ?  Subclinical hypothyroidism       ?  Vertigo           ?  Vitamin D deficiency            Past Surgical History:         Procedure  Laterality  Date          ?  CARDIAC SURG PROCEDURE UNLIST              Cardiac Cath PCI w/ Stents          ?  HX CHOLECYSTECTOMY         ?  HX GI              Partial Gastrectomy           ?  HX GI    01/03/2015          [facility] EGD by Dr. Smitty Cords D. Waldholtz: S/P B-2 Gastric Resection, Gastritis in small remnant, Bx of Jejunum r/o Celiac Disease and of Stomach. + Gastritis  + H. Pylori          ?  HX HYSTERECTOMY                 Social History          Social History          Socioeconomic History         ?  Marital status:  DIVORCED              Spouse name:  Not on file         ?  Number of children:  Not on file     ?  Years of education:  Not on file     ?  Highest education level:  Not on file       Tobacco Use         ?  Smoking status:  Former Smoker     ?  Smokeless tobacco:  Never Used        Substance and Sexual Activity         ?  Alcohol use:  No     ?  Drug use:  No     ?  Sexual activity:  Not Currently              Partners:  Male             Family History          Family History         Problem  Relation  Age of Onset          ?  Diabetes  Maternal Grandmother       ?  Heart Disease  Maternal Grandmother            ?  Heart  Disease  Mother               Current Medications          Current Outpatient Medications          Medication  Sig  Dispense  Refill           ?  calcium carbonate (CALTREX) 600 mg calcium (1,500 mg) tablet  Take 600 mg by mouth daily.         ?  apixaban (ELIQUIS) 5 mg (74 tabs) starter pack  Take 10 mg (two 5 mg tablets) by mouth twice a day for 7 days    Followed by 5 mg (one 5 mg tablet) by mouth twice a day  1 Dose Pack  0     ?  omeprazole (PRILOSEC) 20 mg capsule  Take 20 mg by mouth daily.         ?  ondansetron hcl (ZOFRAN) 4 mg tablet  Take 1 Tab by mouth every eight (8) hours as needed for Nausea.  15 Tab  0           ?  cyclobenzaprine (FLEXERIL) 10 mg tablet  Take 10 mg by mouth three (3) times daily as needed for Muscle Spasm(s).               ?  sucralfate (CARAFATE) 100 mg/mL suspension  Take 1 tsp by mouth two (2) times a day.         ?  ascorbic acid, vitamin C, (VITAMIN C) 250 mg tablet  Take 1 Tab by mouth two (2) times a day. Take with iron pill  60 Tab  3     ?  oxyCODONE IR (OXY-IR) 15 mg immediate release tablet  Take 1 Tab by mouth four (4) times daily. Max Daily Amount: 60 mg.  10 Tab  0     ?  simvastatin (ZOCOR) 80 mg tablet  Take 80 mg by mouth nightly.         ?  lamoTRIgine (LAMICTAL) 100 mg tablet  Take 1 Tab by mouth two (2) times a day.  60 Tab  0           ?  Cholecalciferol, Vitamin D3, (VITAMIN D) 1,000 unit Cap  Take 50,000 Units by mouth every Sunday.                 Allergies          Allergies        Allergen  Reactions         ?  Tape [Adhesive]  Itching             Paper tape             ?  Aspirin  Not Reported This Time              Because of hx ulcers per patient         ?  Opana [Oxymorphone]  Rash and Itching         ?  Tylenol [Acetaminophen]  Itching             Physical Exam        Visit Vitals      BP  157/89 (BP 1 Location: Right arm, BP Patient Position: Sitting)     Pulse  86     Temp  98 ??F (36.7 ??C)     Resp  22  Ht  5\' 7"  (1.702 m)     Wt  64.9 kg (143 lb)     SpO2  100%        BMI  22.40 kg/m??        Physical Exam   Vitals signs reviewed.   HENT :       Mouth/Throat:      Mouth: Mucous membranes are moist.   Eyes :       Conjunctiva/sclera: Conjunctivae normal.   Neck :       Musculoskeletal: Normal range of motion and neck supple. No muscular tenderness.    Cardiovascular:       Rate and Rhythm: Normal rate and regular rhythm.   Pulmonary :       Effort: Pulmonary effort is normal.      Breath sounds: Normal breath sounds.   Chest:       Chest wall: No tenderness.   Abdominal :      Palpations: Abdomen is soft.      Tenderness: There is no tenderness.     Musculoskeletal:      Comments: Left upper extremity shows a large amount of soft tissue swelling and ecchymosis over the dorsal second  MCP where the skin is intact generally tender over this area there is no scaphoid tender the thumb is nontender and she has good pedal pulses good cap refill the wrist forearm elbow upper arm or shoulder are all intact and nontender.  Right upper extremities  atraumatic warm dry well-perfused and nontender.  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      Findings: Bruising present.   Neurological:       General: No focal  deficit present.      Mental Status: She is alert and oriented to person, place, and time.    Psychiatric:         Mood and Affect: Mood normal.         Behavior: Behavior normal.               Impression and Management Plan     68 year old female presents after mechanical fall with injury to her left hand and right foot x-rays will be obtained evaluate for possible fracture.  She did not hit her head there  was no loss of consciousness she has no headache she has full recollection of the fall.  She states she usually takes oxycodone for pain she cannot take Tylenol.  I will go ahead and order an oxycodone IR here.I have also ordered an ice pack        Diagnostic Studies     Lab:    No results found for this or any previous visit (from the past 12 hour(s)).      Imaging:        X-ray left hand shows no definite fracture dislocation as read by Dr. Bebe ShaggyJon Romash J. Kysa Calais, PA-C   X-ray right foot shows no definite fracture dislocation old amputation seen as reviewed by Dr. Juanito DoomJon Romash  and J Sinia Antosh PA-C   No results found.   Procedures        Medical Decision Making/ED Course     Findings discussed.  We will place in a blue aluminum splint for comfort on the left hand ice elevate PMP was reviewed she had 120 oxycodone IR's filled on 09/02/2018 she can continue  with those.  Again she is adamant she did not her head she has no headache she has a normal neuro exam discussed follow-up this week with her doctor for recheck.  May need to follow-up with orthopedics if symptoms persist.  Advised that x-rays to be officially  by radiology later today     Medications       oxyCODONE IR (ROXICODONE) tablet 15 mg (15 mg Oral Given 09/07/18 0023)             Final Diagnosis                 ICD-10-CM  ICD-9-CM          1.  Contusion of left hand, initial encounter  S60.222A  923.20     2.  Contusion of right great toe without damage to nail, initial encounter  S90.111A  924.3          3.  Traumatic hematoma of left hand, initial  encounter  S60.222A  923.20             Disposition     Patient was discharged home in stable condition to follow-up as above. Patient was examined and evaluated by myself and ROMASH, JONATHAN A, MD who agrees with the above assessment  and plan.             Mikal Plane, PA-C   September 07, 2018      My signature above authenticates this document and my orders, the final     diagnosis (es), discharge prescription (s), and instructions in the Epic     record.   If you have any questions please contact [phone].   Dragon medical dictation software was used for portions of this report. Unintended voice recognition errors may occur.

## 2018-09-07 NOTE — ED Provider Notes (Signed)
[facility]  Emergency Department Treatment Report    Patient: [name] [name] Age: 68 y.o. Sex: female    Date of Birth: September 16, 1950 Admit Date: 09/06/2018 PCP: Coralie Carpen, MD   MRN: [id number]  CSN: [id number]  Attn: Wynelle Bourgeois, MD   Room: H02/H02 Time Dictated: 12:19 AM APP: Maurice Small. Williams Che, PA-C       Chief Complaint   Fall hand and foot pain  History of Present Illness   68 y.o. female who presents stating she moved into a new house tripped this evening around 20 100-20 130 going up the steps not sure exactly how she landed but injured her left hand and her right foot primarily her big toe.  She did not hit her head there was no loss of consciousness she denies any neck back hip chest or abdominal pain.  She does note that she is on Eliquis and so she is always very careful.  She is right-hand dominant.  She is not taking anything for pain at home she presents for evaluation at this time.  Rates her pain a 10 on a 0-to-10 scale.    Review of Systems   Review of Systems   HENT:        No bleeding ear nose or mouth   Respiratory: Negative for shortness of breath.    Cardiovascular: Negative for chest pain.   Gastrointestinal: Negative for abdominal pain, nausea and vomiting.   Musculoskeletal: Negative for back pain and neck pain.        Left hand and right foot no ankle knee hip back or neck pain   Skin:        No abrasions or laceration   Neurological: Negative for dizziness, sensory change, focal weakness, loss of consciousness and headaches.         No Head injury   All other systems reviewed and are negative.      Past Medical/Surgical History     Past Medical History:   Diagnosis Date   ??? Acute hypercapnic respiratory failure (HCC) 02/05/2013    W/ Encephalopathy requiring BiPAP, Etiology Uncertain however Possible Narcotic Benzodiazipine Related?    ??? Adjustment disorder with mixed anxiety and depressed mood    ??? Aneurysm of internal carotid artery    ??? Arthritis of knee     ??? Asthma    ??? Autoimmune disease (HCC)    ??? CAD (coronary artery disease)    ??? Cervicalgia    ??? Chest pain 03/17/2014    Dobutamine Stress Echo: NORMAL WALL MOTION AND GLOBAL SYSTOLIC FUNCTION AT REST AND AFTER?? STRESS/EXERCISE WITH APPROPRIATE AUGMENTATION OF FUNCTION IN ALL SEGMENTS. NO EVIDENCE OF INDUCIBLE ISCHEMIA AT ADEQUATE HEART RATE WITH DOBUTAMINE STRESS.??            ??? Chronic low back pain 10/22/2014    [facility] MRI Lumbar w/o contrast: Moderate facet arthropathy at L4-L5 and L5-S1. Mild/moderate foraminal stenosis at these levels. Disc bulge with annular tear at L4-L5.    ??? Chronic pain syndrome    ??? Coccydynia    ??? Diabetes mellitus     NIDDM   ??? DVT (deep venous thrombosis) (HCC) 05/19/2007    [facility] CTA: 1. Small nonocclusive pulmonary embolus in a subsegmental branch to the left lower lobe. Larger filling defect in a segmental branch to the right lower lobe, not as low in attenuation as is normally seen with a pulmonary embolus, however is seen in multiple planes and remains concerning  for an additional nonocclusive pulmonary embolus.   ??? Fibromyalgia    ??? Gastric ulcer    ??? Gastritis 01/03/2015    [facility] Admission, EGD + H. Pylori/Gastritis   ??? Generalized osteoarthritis of multiple sites    ??? GI bleed 01/01/2015    [facility] Admit 5/2-01/07/2015   ??? H. pylori infection 01/03/2015    [facility] Admission, EGD + H. Pylori/Gastritis   ??? Hemiparesis, right (HCC)    ??? Hyperlipidemia    ??? Hypertension    ??? Irregular sleep-wake rhythm    ??? Knee joint pain    ??? Lumbar spondylosis     Orthopedic Spine Surgeon: Dr. Ree KidaJack L. Henrene HawkingSiegel    ??? Muscle spasm    ??? Normocytic anemia     Chronic Iron Deficiency Anemia   ??? Osteoporosis    ??? Pancreatitis    ??? Peripheral neuropathy    ??? Pulmonary embolism (HCC)     Multiple   ??? Seizures (HCC)    ??? Sensory ataxia    ??? SLE (systemic lupus erythematosus) (HCC)    ??? Stroke [facility](HCC)    ??? Subclinical hypothyroidism    ??? Vertigo    ??? Vitamin D deficiency      Past Surgical History:    Procedure Laterality Date   ??? CARDIAC SURG PROCEDURE UNLIST      Cardiac Cath PCI w/ Stents   ??? HX CHOLECYSTECTOMY     ??? HX GI      Partial Gastrectomy    ??? HX GI  01/03/2015    [facility] EGD by Dr. Smitty CordsBruce D. Waldholtz: S/P B-2 Gastric Resection, Gastritis in small remnant, Bx of Jejunum r/o Celiac Disease and of Stomach. + Gastritis + H. Pylori   ??? HX HYSTERECTOMY         Social History     Social History     Socioeconomic History   ??? Marital status: DIVORCED     Spouse name: Not on file   ??? Number of children: Not on file   ??? Years of education: Not on file   ??? Highest education level: Not on file   Tobacco Use   ??? Smoking status: Former Smoker   ??? Smokeless tobacco: Never Used   Substance and Sexual Activity   ??? Alcohol use: No   ??? Drug use: No   ??? Sexual activity: Not Currently     Partners: Male       Family History     Family History   Problem Relation Age of Onset   ??? Diabetes Maternal Grandmother    ??? Heart Disease Maternal Grandmother    ??? Heart Disease Mother        Current Medications     Current Outpatient Medications   Medication Sig Dispense Refill   ??? calcium carbonate (CALTREX) 600 mg calcium (1,500 mg) tablet Take 600 mg by mouth daily.     ??? apixaban (ELIQUIS) 5 mg (74 tabs) starter pack Take 10 mg (two 5 mg tablets) by mouth twice a day for 7 days   Followed by 5 mg (one 5 mg tablet) by mouth twice a day 1 Dose Pack 0   ??? omeprazole (PRILOSEC) 20 mg capsule Take 20 mg by mouth daily.     ??? ondansetron hcl (ZOFRAN) 4 mg tablet Take 1 Tab by mouth every eight (8) hours as needed for Nausea. 15 Tab 0   ??? cyclobenzaprine (FLEXERIL) 10 mg tablet Take 10 mg by mouth three (3) times  daily as needed for Muscle Spasm(s).     ??? sucralfate (CARAFATE) 100 mg/mL suspension Take 1 tsp by mouth two (2) times a day.     ??? ascorbic acid, vitamin C, (VITAMIN C) 250 mg tablet Take 1 Tab by mouth two (2) times a day. Take with iron pill 60 Tab 3   ??? oxyCODONE IR (OXY-IR) 15 mg immediate release tablet Take 1 Tab by mouth  four (4) times daily. Max Daily Amount: 60 mg. 10 Tab 0   ??? simvastatin (ZOCOR) 80 mg tablet Take 80 mg by mouth nightly.     ??? lamoTRIgine (LAMICTAL) 100 mg tablet Take 1 Tab by mouth two (2) times a day. 60 Tab 0   ??? Cholecalciferol, Vitamin D3, (VITAMIN D) 1,000 unit Cap Take 50,000 Units by mouth every Sunday.         Allergies     Allergies   Allergen Reactions   ??? Tape [Adhesive] Itching     Paper tape      ??? Aspirin Not Reported This Time     Because of hx ulcers per patient   ??? Opana [Oxymorphone] Rash and Itching   ??? Tylenol [Acetaminophen] Itching       Physical Exam     Visit Vitals  BP 157/89 (BP 1 Location: Right arm, BP Patient Position: Sitting)   Pulse 86   Temp 98 ??F (36.7 ??C)   Resp 22   Ht 5\' 7"  (1.702 m)   Wt 64.9 kg (143 lb)   SpO2 100%   BMI 22.40 kg/m??     Physical Exam  Vitals signs reviewed.   HENT:      Mouth/Throat:      Mouth: Mucous membranes are moist.   Eyes:      Conjunctiva/sclera: Conjunctivae normal.   Neck:      Musculoskeletal: Normal range of motion and neck supple. No muscular tenderness.   Cardiovascular:      Rate and Rhythm: Normal rate and regular rhythm.   Pulmonary:      Effort: Pulmonary effort is normal.      Breath sounds: Normal breath sounds.   Chest:      Chest wall: No tenderness.   Abdominal:      Palpations: Abdomen is soft.      Tenderness: There is no tenderness.   Musculoskeletal:      Comments: Left upper extremity shows a large amount of soft tissue swelling and ecchymosis over the dorsal second MCP where the skin is intact generally tender over this area there is no scaphoid tender the thumb is nontender and she has good pedal pulses good cap refill the wrist forearm elbow upper arm or shoulder are all intact and nontender.  Right upper extremities atraumatic warm dry well-perfused and nontender.  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      Findings: Bruising present.   Neurological:      General: No focal deficit present.      Mental Status: She is alert and oriented to person,  place, and time.   Psychiatric:         Mood and Affect: Mood normal.         Behavior: Behavior normal.         Impression and Management Plan   68 year old female presents after mechanical fall with injury to her left hand and right foot x-rays will be obtained evaluate for possible fracture.  She did not hit her head there was no loss of consciousness she has no headache she has full recollection of the fall.  She states she usually takes oxycodone for pain she cannot take Tylenol.  I will go ahead and order an oxycodone IR here.I have also ordered an ice pack    Diagnostic Studies   Lab:   No results found for this or any previous visit (from the past 12 hour(s)).    Imaging:      X-ray left hand shows no definite fracture dislocation as read by Dr. Bebe ShaggyJon Romash J. Lolly Glaus, PA-C  X-ray right foot shows no definite fracture dislocation old amputation seen as reviewed by Dr. Juanito DoomJon Romash and Margaretha SeedsJ Cashe Gatt PA-C  No results found.  Procedures    Medical Decision Making/ED Course   Findings discussed.  We will place in a blue aluminum splint for comfort on the left hand ice elevate PMP was reviewed she had 120 oxycodone IR's filled on 09/02/2018 she can continue with those.  Again she is adamant she did not her head she has no headache she has a normal neuro exam discussed follow-up this week with her doctor for recheck.  May need to follow-up with orthopedics if symptoms persist.  Advised that x-rays to be officially by radiology later today  Medications    oxyCODONE IR (ROXICODONE) tablet 15 mg (15 mg Oral Given 09/07/18 0023)       Final Diagnosis       ICD-10-CM ICD-9-CM   1. Contusion of left hand, initial encounter S60.222A 923.20   2. Contusion of right great toe without damage to nail, initial encounter S90.111A 924.3   3. Traumatic hematoma of left hand, initial encounter S60.222A 923.20       Disposition   Patient was discharged home in stable condition to follow-up as above. Patient was examined and evaluated by myself and ROMASH, JONATHAN A, MD who agrees with the above assessment and plan.         Mikal PlaneJulia Charlesetta Milliron, PA-C  September 07, 2018    My signature above authenticates this document and my orders, the final    diagnosis (es), discharge prescription (s), and instructions in the Epic    record.  If you have any questions please contact [phone].  Dragon medical dictation software was used for portions of this report. Unintended voice recognition errors may occur.

## 2018-09-07 NOTE — ED Notes (Signed)
Pt asleep in Hall bed and in no apparent distress. Family member at bedside. No further needs at this time.

## 2018-09-07 NOTE — ED Notes (Signed)
2:01 AM  09/07/18     Discharge instructions given to pt with verbalization of understanding. Patient accompanied by family members.  Patient discharged with no prescriptions. Patient discharged to home.      [name] [name]

## 2018-09-08 ENCOUNTER — Inpatient Hospital Stay
Admit: 2018-09-08 | Discharge: 2018-09-08 | Disposition: A | Discharge status: Home / Self Care | Payer: PRIVATE HEALTH INSURANCE | Attending: Emergency Medicine

## 2018-09-08 DIAGNOSIS — S60222A Contusion of left hand, initial encounter: Secondary | ICD-10-CM

## 2018-09-08 NOTE — ED Notes (Signed)
Patient complains of left hand pain and today she noticed her fingers were purplish in color and cool, in triage fingers are warm to touch and no discoloration.

## 2018-09-08 NOTE — ED Notes (Signed)
The mid-level provider and I discussed the patient's history, physical exam, differential diagnosis, and ancillary information.  I personally saw and examined the patient. I have reviewed and agree with the midlevel findings, including all diagnostic interpretations, and plans as written. I was present during the key portions of separately billed procedures.      Portions of this chart were created with Dragon medical speech to text program.   Unrecognized errors may be present.    Patient status post fall, left hand contusion and ecchymosis extending to the fingers, no concern for missed fracture, likely related to her anticoagulant usage, recommended compression, elevation, follow-up with PCP for further management.

## 2018-09-08 NOTE — ED Provider Notes (Signed)
ED Provider Notes by Hilma Favors, PA at 09/08/18 1634                Author: Hilma Favors, PA  Service: Emergency Medicine  Author Type: Physician Assistant       Filed: 09/08/18 2201  Date of Service: 09/08/18 1634  Status: Attested           Editor: Makynli Stills, Estevan Ryder, PA (Physician Assistant)  Cosigner: Despina Hick, MD at 09/08/18 2256          Attestation signed by Despina Hick, MD at 09/08/18 2256          The mid-level provider and I discussed the patient's history, physical exam, differential diagnosis, and ancillary information.  I personally saw and examined  the patient. I have reviewed and agree with the midlevel findings, including all diagnostic interpretations, and plans as written. I was present during the key portions of separately billed procedures.      Additional documentation present in free text note.          Despina Hick, MD                                    [facility]   Emergency Department Treatment Report          Patient: [name] [name]  Age: 68 y.o.  Sex: female          Date of Birth: 05/17/1951  Admit Date: 09/08/2018  PCP: Coralie Carpen, MD     MRN: [id number]   CSN: [id number]   Attending: Despina Hick, MD         Room: [phone]  Time Dictated: 4:34 PM  APP:  Lavone Nian, PA-C        Chief Complaint      Chief Complaint       Patient presents with        ?  Hand Pain               History of Present Illness     68 y.o. female  presents emergency department with left hand pain after injury 2 days ago.  Patient states that she was seen after a fall and evaluated at [facility] 2 nights ago with x-rays that were negative of her left hand.  Patient was discharged home with splint and  states that she continues to have pain.  She states that she had purplish discoloration and numbness tingling in her fingers earlier today but seemed to improve.  She continues to have swelling and is concerned.  She reports that she has been  elevating  her hand.  He denies recurrent falls or new injury.  She denies other symptoms.  Patient is on Eliquis.        Review of Systems     Constitutional: No fever   Respiratory: No shortness of breath     Cardiovascular: No chest pain    Gastrointestinal:   No nausea, vomiting    Musculoskeletal: + swelling.   Integumentary: No lacerations or abrasions   Neurological:  No extremity weakness+paresthesias.         Past Medical/Surgical History          Past Medical History:        Diagnosis  Date         ?  Acute hypercapnic respiratory failure (HCC)  02/05/2013          W/ Encephalopathy requiring BiPAP, Etiology Uncertain however Possible Narcotic Benzodiazipine Related?          ?  Adjustment disorder with mixed anxiety and depressed mood       ?  Aneurysm of internal carotid artery       ?  Arthritis of knee       ?  Asthma       ?  Autoimmune disease (HCC)       ?  CAD (coronary artery disease)       ?  Cervicalgia       ?  Chest pain  03/17/2014          Dobutamine Stress Echo: NORMAL WALL MOTION AND GLOBAL SYSTOLIC FUNCTION AT REST AND AFTER?? STRESS/EXERCISE WITH APPROPRIATE AUGMENTATION OF FUNCTION  IN ALL SEGMENTS. NO EVIDENCE OF INDUCIBLE ISCHEMIA AT ADEQUATE HEART RATE WITH DOBUTAMINE STRESS.??                  ?  Chronic low back pain  10/22/2014          [facility] MRI Lumbar w/o contrast: Moderate facet arthropathy at L4-L5 and L5-S1. Mild/moderate foraminal stenosis at these levels. Disc bulge with annular  tear at L4-L5.          ?  Chronic pain syndrome       ?  Coccydynia       ?  Diabetes mellitus            NIDDM         ?  DVT (deep venous thrombosis) (HCC)  05/19/2007          [facility] CTA: 1. Small nonocclusive pulmonary embolus in a subsegmental branch to the left lower lobe. Larger filling defect in a segmental branch to  the right lower lobe, not as low in attenuation as is normally seen with a pulmonary embolus, however is seen in multiple planes and remains concerning for an additional  nonocclusive pulmonary embolus.         ?  Fibromyalgia       ?  Gastric ulcer       ?  Gastritis  01/03/2015          [facility] Admission, EGD + H. Pylori/Gastritis         ?  Generalized osteoarthritis of multiple sites       ?  GI bleed  01/01/2015          [facility] Admit 5/2-01/07/2015         ?  H. pylori infection  01/03/2015          [facility] Admission, EGD + H. Pylori/Gastritis         ?  Hemiparesis, right (HCC)       ?  Hyperlipidemia       ?  Hypertension       ?  Irregular sleep-wake rhythm       ?  Knee joint pain       ?  Lumbar spondylosis            Orthopedic Spine Surgeon: Dr. Ree Kida L. Henrene Hawking          ?  Muscle spasm       ?  Normocytic anemia            Chronic Iron Deficiency Anemia         ?  Osteoporosis       ?  Pancreatitis       ?  Peripheral neuropathy       ?  Pulmonary embolism (HCC)            Multiple         ?  Seizures (HCC)       ?  Sensory ataxia       ?  SLE (systemic lupus erythematosus) (HCC)       ?  Stroke [facility])       ?  Subclinical hypothyroidism       ?  Vertigo           ?  Vitamin D deficiency            Past Surgical History:         Procedure  Laterality  Date          ?  CARDIAC SURG PROCEDURE UNLIST              Cardiac Cath PCI w/ Stents          ?  HX CHOLECYSTECTOMY         ?  HX GI              Partial Gastrectomy           ?  HX GI    01/03/2015          [facility] EGD by Dr. Smitty Cords D. Waldholtz: S/P B-2 Gastric Resection, Gastritis in small remnant, Bx of Jejunum r/o Celiac Disease and of Stomach. + Gastritis  + H. Pylori          ?  HX HYSTERECTOMY              Social History          Social History          Socioeconomic History         ?  Marital status:  DIVORCED              Spouse name:  Not on file         ?  Number of children:  Not on file     ?  Years of education:  Not on file     ?  Highest education level:  Not on file       Tobacco Use         ?  Smoking status:  Former Smoker     ?  Smokeless tobacco:  Never Used       Substance and Sexual Activity         ?  Alcohol use:   No     ?  Drug use:  No     ?  Sexual activity:  Not Currently              Partners:  Male           Family History          Family History         Problem  Relation  Age of Onset          ?  Diabetes  Maternal Grandmother       ?  Heart Disease  Maternal Grandmother            ?  Heart Disease  Mother             Home Medications          Prior to  Admission Medications     Prescriptions  Last Dose  Informant  Patient Reported?  Taking?      Cholecalciferol, Vitamin D3, (VITAMIN D) 1,000 unit Cap      Yes  No      Sig: Take 50,000 Units by mouth every Sunday.      apixaban (ELIQUIS) 5 mg (74 tabs) starter pack      No  No      Sig: Take 10 mg (two 5 mg tablets) by mouth twice a day for 7 days    Followed by 5 mg (one 5 mg tablet) by mouth twice a day      ascorbic acid, vitamin C, (VITAMIN C) 250 mg tablet      No  No      Sig: Take 1 Tab by mouth two (2) times a day. Take with iron pill      calcium carbonate (CALTREX) 600 mg calcium (1,500 mg) tablet      Yes  No      Sig: Take 600 mg by mouth daily.      cyclobenzaprine (FLEXERIL) 10 mg tablet      Yes  No      Sig: Take 10 mg by mouth three (3) times daily as needed for Muscle Spasm(s).      lamoTRIgine (LAMICTAL) 100 mg tablet      No  No      Sig: Take 1 Tab by mouth two (2) times a day.      omeprazole (PRILOSEC) 20 mg capsule      Yes  No      Sig: Take 20 mg by mouth daily.      ondansetron hcl (ZOFRAN) 4 mg tablet      No  No      Sig: Take 1 Tab by mouth every eight (8) hours as needed for Nausea.      oxyCODONE IR (OXY-IR) 15 mg immediate release tablet      No  No      Sig: Take 1 Tab by mouth four (4) times daily. Max Daily Amount: 60 mg.      simvastatin (ZOCOR) 80 mg tablet      Yes  No      Sig: Take 80 mg by mouth nightly.      sucralfate (CARAFATE) 100 mg/mL suspension      Yes  No      Sig: Take 1 tsp by mouth two (2) times a day.               Facility-Administered Medications: None           Allergies          Allergies        Allergen   Reactions         ?  Tape [Adhesive]  Itching             Paper tape             ?  Aspirin  Not Reported This Time             Because of hx ulcers per patient         ?  Opana [Oxymorphone]  Rash and Itching         ?  Tylenol [Acetaminophen]  Itching             Physical Exam          ED Triage  Vitals [09/08/18 1405]     Enc Vitals Group           BP  124/71        Pulse (Heart Rate)  95        Resp Rate  18        Temp  98.1 ??F (36.7 ??C)        Temp src          O2 Sat (%)  99 %        Weight          Height          Head Circumference          Peak Flow          Pain Score          Pain Loc          Pain Edu?             Excl. in GC?          General: Patient appears well developed and well nourished.     HEENT: Conjunctiva clear.    Neck: Supple, symmetrical.     Respiratory: Lungs clear to auscultation, nonlabored respirations.     Cardiovascular: Heart regular rate and rhythm without murmur, rubs or gallops.    Musculoskeletal: Left hand with edema and ecchymosis with erythema of the second and third MCP with mild edema into the third proximal phalanx.   Patient able to flex and extend all digits but not fully.  Cap refill intact with distal sensation grossly intact.  No wrist tenderness to palpation or pain with range of motion testing.   Integumentary: Warm and dry     Neurologic: Alert and oriented, moving all extremities.        Impression and Management Plan     Upon review of medical records, patient had x-rays performed 2 days ago were found to be negative for fracture.  She is neurovascularly intact.     Diagnostic Studies     Lab:    No results found for this or any previous visit (from the past 12 hour(s)).      Imaging:     No results found.               Final [99]  09/07/2018  00:31  09/07/2018  00:40     Result Information          Status: Final result (Exam End: 09/07/2018 00:40)  Provider Status: Reviewed     Study Result         Indication: Injury with pain   ??   3 views left hand show no bony  abnormality. Extensive soft tissue swelling   overlies the region of the third MCP joint.   ??   IMPRESSION   IMPRESSION: Soft tissue swelling as described in region of third MCP joint. No   destructive bony lesion or fracture.   ??               ED Course/ Medical Decision Making     X-rays are -2 days ago without new injury there is no need to re-x-ray.  Patient is on Eliquis which is likely contributing to her swelling and pain.  Recommendations  for ice and elevation and Tylenol for pain control.  Patient is comfortable with this plan and discharged home.   Medications - No data to display  Discharge Medication List as of 09/08/2018  4:43 PM                    Final Diagnosis                 ICD-10-CM  ICD-9-CM          1.  Contusion of left hand, initial encounter  S60.222A  923.20             Disposition     Discharge to home         The patient was personally evaluated by myself and Despina Hick, MD who agrees with the above assessment and plan      Lavone Nian, PA-C   September 08, 2018      My signature above authenticates this document and my orders, the final ??   diagnosis (es), discharge prescription (s), and instructions in the Epic ??   record.   If you have any questions please contact [phone].   ??   Nursing notes have been reviewed by the physician/ advanced practice ??   Clinician.

## 2018-09-08 NOTE — ED Notes (Signed)
acewrap and brace reapplied.  Patient given instructions and voiced understanding. famiy at bedside

## 2018-09-08 NOTE — ED Notes (Signed)
The mid-level provider and I discussed the patient's history, physical exam, differential diagnosis, and ancillary information.  I personally saw and examined the patient. I have reviewed and agree with the midlevel findings, including all diagnostic interpretations, and plans as written. I was present during the key portions of separately billed procedures.      Portions of this chart were created with Dragon medical speech to text program.   Unrecognized errors may be present.    Patient status post fall, left hand contusion and ecchymosis extending to the fingers, no concern for missed fracture, likely related to her anticoagulant usage, recommended compression, elevation, follow-up with PCP for further management.

## 2018-09-08 NOTE — ED Triage Notes (Signed)
Patient complains of left hand pain and today she noticed her fingers were purplish in color and cool, in triage fingers are warm to touch and no discoloration.

## 2018-09-08 NOTE — ED Notes (Signed)
acewrap and brace reapplied.  Patient given instructions and voiced understanding. famiy at bedside

## 2018-09-08 NOTE — ED Provider Notes (Signed)
[facility]  Emergency Department Treatment Report    Patient: [name] [name] Age: 68 y.o. Sex: female    Date of Birth: 07/26/1951 Admit Date: 09/08/2018 PCP: [name], [name], MD   MRN: [id number]  CSN: [id number]  Attending: Despina HickSHARKEY, CRAIG M, MD   Room: [phone] Time Dictated: 4:34 PM APP:  Lavone NianBrittany Berton Butrick, PA-C     Chief Complaint   Chief Complaint   Patient presents with   ??? Hand Pain         History of Present Illness   68 y.o. female presents emergency department with left hand pain after injury 2 days ago.  Patient states that she was seen after a fall and [facility] ago with x-rays that were negative of her left hand.  Patient was discharged home with splint and states that she continues to have pain.  She states that she had purplish discoloration and numbness tingling in her fingers earlier today but seemed to improve.  She continues to have swelling and is concerned.  She reports that she has been elevating her hand.  He denies recurrent falls or new injury.  She denies other symptoms.  Patient is on Eliquis.    Review of Systems   Constitutional: No fever  Respiratory: No shortness of breath    Cardiovascular: No chest pain   Gastrointestinal:   No nausea, vomiting   Musculoskeletal: + swelling.  Integumentary: No lacerations or abrasions  Neurological:  No extremity weakness+paresthesias.     Past Medical/Surgical History     Past Medical History:   Diagnosis Date   ??? Acute hypercapnic respiratory failure (HCC) 02/05/2013    W/ Encephalopathy requiring BiPAP, Etiology Uncertain however Possible Narcotic Benzodiazipine Related?    ??? Adjustment disorder with mixed anxiety and depressed mood    ??? Aneurysm of internal carotid artery    ??? Arthritis of knee    ??? Asthma    ??? Autoimmune disease (HCC)    ??? CAD (coronary artery disease)    ??? Cervicalgia    ??? Chest pain 03/17/2014    Dobutamine Stress Echo: NORMAL WALL MOTION AND GLOBAL SYSTOLIC FUNCTION  AT REST AND AFTER?? STRESS/EXERCISE WITH APPROPRIATE AUGMENTATION OF FUNCTION IN ALL SEGMENTS. NO EVIDENCE OF INDUCIBLE ISCHEMIA AT ADEQUATE HEART RATE WITH DOBUTAMINE STRESS.??            ??? Chronic low back pain 10/22/2014    [facility] w[facility]o [facility]: Moderate facet arthropathy at L4-L5 and L5-S1. Mild/moderate foraminal stenosis at these levels. Disc bulge with annular tear at L4-L5.    ??? Chronic pain syndrome    ??? Coccydynia    ??? Diabetes mellitus     NIDDM   ??? DVT (deep venous thrombosis) (HCC) 05/19/2007    [facility] CTA: 1. Small nonocclusive pulmonary embolus in a subsegmental branch to the left lower lobe. Larger filling defect in a segmental branch to the right lower lobe, not as low in attenuation as is normally seen with a pulmonary embolus, however is seen in multiple planes and remains concerning for an additional nonocclusive pulmonary embolus.   ??? Fibromyalgia    ??? Gastric ulcer    ??? Gastritis 01/03/2015    [facility] Admission, EGD + H. Pylori/Gastritis   ??? Generalized osteoarthritis of multiple sites    ??? GI bleed 01/01/2015    [facility] Admit 5/2-01/07/2015   ??? H. pylori infection 01/03/2015    [facility] Admission, EGD + H. Pylori/Gastritis   ??? Hemiparesis,  right [facility])    ??? Hyperlipidemia    ??? Hypertension    ??? Irregular sleep-wake rhythm    ??? Knee joint pain    ??? Lumbar spondylosis     Orthopedic Spine Surgeon: Dr. Ree Kida L. Henrene Hawking    ??? Muscle spasm    ??? Normocytic anemia     Chronic Iron Deficiency Anemia   ??? Osteoporosis    ??? Pancreatitis    ??? Peripheral neuropathy    ??? Pulmonary embolism (HCC)     Multiple   ??? Seizures (HCC)    ??? Sensory ataxia    ??? SLE (systemic lupus erythematosus) (HCC)    ??? Stroke [facility])    ??? Subclinical hypothyroidism    ??? Vertigo    ??? Vitamin D deficiency      Past Surgical History:   Procedure Laterality Date   ??? CARDIAC SURG PROCEDURE UNLIST      Cardiac Cath PCI w/ Stents   ??? HX CHOLECYSTECTOMY     ??? HX GI      Partial Gastrectomy    ??? HX GI  01/03/2015     [facility] EGD by Dr. Smitty Cords D. Waldholtz: S/P B-2 Gastric Resection, Gastritis in small remnant, Bx of Jejunum r/o Celiac Disease and of Stomach. + Gastritis + H. Pylori   ??? HX HYSTERECTOMY       Social History     Social History     Socioeconomic History   ??? Marital status: DIVORCED     Spouse name: Not on file   ??? Number of children: Not on file   ??? Years of education: Not on file   ??? Highest education level: Not on file   Tobacco Use   ??? Smoking status: Former Smoker   ??? Smokeless tobacco: Never Used   Substance and Sexual Activity   ??? Alcohol use: No   ??? Drug use: No   ??? Sexual activity: Not Currently     Partners: Male      Family History     Family History   Problem Relation Age of Onset   ??? Diabetes Maternal Grandmother    ??? Heart Disease Maternal Grandmother    ??? Heart Disease Mother       Home Medications     Prior to Admission Medications   Prescriptions Last Dose Informant Patient Reported? Taking?   Cholecalciferol, Vitamin D3, (VITAMIN D) 1,000 unit Cap   Yes No   Sig: Take 50,000 Units by mouth every Sunday.   apixaban (ELIQUIS) 5 mg (74 tabs) starter pack   No No   Sig: Take 10 mg (two 5 mg tablets) by mouth twice a day for 7 days   Followed by 5 mg (one 5 mg tablet) by mouth twice a day   ascorbic acid, vitamin C, (VITAMIN C) 250 mg tablet   No No   Sig: Take 1 Tab by mouth two (2) times a day. Take with iron pill   calcium carbonate (CALTREX) 600 mg calcium (1,500 mg) tablet   Yes No   Sig: Take 600 mg by mouth daily.   cyclobenzaprine (FLEXERIL) 10 mg tablet   Yes No   Sig: Take 10 mg by mouth three (3) times daily as needed for Muscle Spasm(s).   lamoTRIgine (LAMICTAL) 100 mg tablet   No No   Sig: Take 1 Tab by mouth two (2) times a day.   omeprazole (PRILOSEC) 20 mg capsule   Yes No   Sig: Take  20 mg by mouth daily.   ondansetron hcl (ZOFRAN) 4 mg tablet   No No   Sig: Take 1 Tab by mouth every eight (8) hours as needed for Nausea.   oxyCODONE IR (OXY-IR) 15 mg immediate release tablet   No No    Sig: Take 1 Tab by mouth four (4) times daily. Max Daily Amount: 60 mg.   simvastatin (ZOCOR) 80 mg tablet   Yes No   Sig: Take 80 mg by mouth nightly.   sucralfate (CARAFATE) 100 mg/mL suspension   Yes No   Sig: Take 1 tsp by mouth two (2) times a day.      Facility-Administered Medications: None      Allergies     Allergies   Allergen Reactions   ??? Tape [Adhesive] Itching     Paper tape      ??? Aspirin Not Reported This Time     Because of hx ulcers per patient   ??? Opana [Oxymorphone] Rash and Itching   ??? Tylenol [Acetaminophen] Itching       Physical Exam     ED Triage Vitals [09/08/18 1405]   Enc Vitals Group      BP 124/71      Pulse (Heart Rate) 95      Resp Rate 18      Temp 98.1 ??F (36.7 ??C)      Temp src       O2 Sat (%) 99 %      Weight       Height       Head Circumference       Peak Flow       Pain Score       Pain Loc       Pain Edu?       Excl. in GC?      General: Patient appears well developed and well nourished.    HEENT: Conjunctiva clear.   Neck: Supple, symmetrical.    Respiratory: Lungs clear to auscultation, nonlabored respirations.    Cardiovascular: Heart regular rate and rhythm without murmur, rubs or gallops.   Musculoskeletal: Left hand with edema and ecchymosis with erythema of the second and third MCP with mild edema into the third proximal phalanx.  Patient able to flex and extend all digits but not fully.  Cap refill intact with distal sensation grossly intact.  No wrist tenderness to palpation or pain with range of motion testing.  Integumentary: Warm and dry    Neurologic: Alert and oriented, moving all extremities.     Impression and Management Plan   Upon review of medical records, patient had x-rays performed 2 days ago were found to be negative for fracture.  She is neurovascularly intact.  Diagnostic Studies   Lab:   No results found for this or any previous visit (from the past 12 hour(s)).    Imaging:    No results found.       Final [99] 09/07/2018 00:31 09/07/2018 00:40    Result Information     Status: Final result (Exam End: 09/07/2018 00:40) Provider Status: Reviewed   Study Result     Indication: Injury with pain  ??  3 views left hand show no bony abnormality. Extensive soft tissue swelling  overlies the region of the third MCP joint.  ??  IMPRESSION  IMPRESSION: Soft tissue swelling as described in region of third MCP joint. No  destructive bony lesion or fracture.  ??  ED Course/ Medical Decision Making   X-rays are -2 days ago without new injury there is no need to re-x-ray.  Patient is on Eliquis which is likely contributing to her swelling and pain.  Recommendations for ice and elevation and Tylenol for pain control.  Patient is comfortable with this plan and discharged home.  Medications - No data to display    Discharge Medication List as of 09/08/2018  4:43 PM          Final Diagnosis       ICD-10-CM ICD-9-CM   1. Contusion of left hand, initial encounter S60.222A 923.20       Disposition   Discharge to home      The patient was personally evaluated by myself and Despina Hick, MD who agrees with the above assessment and plan    Lavone Nian, PA-C  September 08, 2018    My signature above authenticates this document and my orders, the final ??  diagnosis (es), discharge prescription (s), and instructions in the Epic ??  record.  If you have any questions please contact [phone].  ??  Nursing notes have been reviewed by the physician/ advanced practice ??  Clinician.

## 2018-11-09 ENCOUNTER — Emergency Department: Admit: 2018-11-09 | Payer: PRIVATE HEALTH INSURANCE | Primary: Internal Medicine

## 2018-11-09 ENCOUNTER — Inpatient Hospital Stay
Admit: 2018-11-09 | Discharge: 2018-11-09 | Disposition: A | Discharge status: Home / Self Care | Payer: PRIVATE HEALTH INSURANCE | Attending: Emergency Medicine

## 2018-11-09 DIAGNOSIS — S2232XA Fracture of one rib, left side, initial encounter for closed fracture: Secondary | ICD-10-CM

## 2018-11-09 LAB — POC URINE MACROSCOPIC
Bilirubin, Urine: NEGATIVE
Bilirubin: NEGATIVE
Blood, Urine: NEGATIVE
Blood: NEGATIVE
Glucose, Ur: NEGATIVE mg/dl
Glucose: NEGATIVE mg/dl
Ketone: NEGATIVE mg/dl
Ketones, Urine: NEGATIVE mg/dl
Nitrite, Urine: NEGATIVE
Nitrites: NEGATIVE
Protein, UA: NEGATIVE mg/dl
Protein: NEGATIVE mg/dl
Specific Gravity, UA: 1.02 (ref 1.005–1.030)
Specific gravity: 1.02 (ref 1.005–1.030)
Urobilinogen, UA, POCT: 0.2 EU/dl (ref 0.0–1.0)
Urobilinogen: 0.2 EU/dl (ref 0.0–1.0)
pH (UA): 6.5 (ref 5–9)
pH, UA: 6.5 (ref 5–9)

## 2018-11-09 LAB — METABOLIC PANEL, BASIC
Anion gap: 4 mmol/L — ABNORMAL LOW (ref 5–15)
BUN: 11 mg/dl (ref 7–25)
CO2: 28 mEq/L (ref 21–32)
Calcium: 9.2 mg/dl (ref 8.5–10.1)
Chloride: 107 mEq/L (ref 98–107)
Creatinine: 0.7 mg/dl (ref 0.6–1.3)
GFR est AA: >60
GFR est non-AA: >60
Glucose: 75 mg/dl (ref 74–106)
Potassium: 3.8 mEq/L (ref 3.5–5.1)
Sodium: 139 mEq/L (ref 136–145)

## 2018-11-09 LAB — CBC WITH AUTOMATED DIFF
BASOPHILS: 0.4 % (ref 0–3)
EOSINOPHILS: 2.7 % (ref 0–5)
HCT: 33 % — ABNORMAL LOW (ref 37.0–50.0)
HGB: 9.4 gm/dl — ABNORMAL LOW (ref 13.0–17.2)
IMMATURE GRANULOCYTES: 0.1 % (ref 0.0–3.0)
LYMPHOCYTES: 20.5 % — ABNORMAL LOW (ref 28–48)
MCH: 21.3 pg — ABNORMAL LOW (ref 25.4–34.6)
MCHC: 28.5 gm/dl — ABNORMAL LOW (ref 30.0–36.0)
MCV: 74.7 fL — ABNORMAL LOW (ref 80.0–98.0)
MONOCYTES: 4 % (ref 1–13)
MPV: 10.2 fL — ABNORMAL HIGH (ref 6.0–10.0)
NEUTROPHILS: 72.3 % — ABNORMAL HIGH (ref 34–64)
NRBC: 0 (ref 0–0)
PLATELET: 289 10*3/uL (ref 140–450)
RBC: 4.42 M/uL (ref 3.60–5.20)
RDW-SD: 57.2 — ABNORMAL HIGH (ref 36.4–46.3)
WBC: 7.3 10*3/uL (ref 4.0–11.0)

## 2018-11-09 LAB — POC URINE MICROSCOPIC

## 2018-11-09 LAB — CBC WITH AUTO DIFFERENTIAL
Basophils %: 0.4 % (ref 0–3)
Eosinophils %: 2.7 % (ref 0–5)
Hematocrit: 33 % — ABNORMAL LOW (ref 37.0–50.0)
Hemoglobin: 9.4 gm/dl — ABNORMAL LOW (ref 13.0–17.2)
Immature Granulocytes: 0.1 % (ref 0.0–3.0)
Lymphocytes %: 20.5 % — ABNORMAL LOW (ref 28–48)
MCH: 21.3 pg — ABNORMAL LOW (ref 25.4–34.6)
MCHC: 28.5 gm/dl — ABNORMAL LOW (ref 30.0–36.0)
MCV: 74.7 fL — ABNORMAL LOW (ref 80.0–98.0)
MPV: 10.2 fL — ABNORMAL HIGH (ref 6.0–10.0)
Monocytes %: 4 % (ref 1–13)
Neutrophils %: 72.3 % — ABNORMAL HIGH (ref 34–64)
Nucleated RBCs: 0 (ref 0–0)
Platelets: 289 10*3/uL (ref 140–450)
RBC: 4.42 M/uL (ref 3.60–5.20)
RDW-SD: 57.2 — ABNORMAL HIGH (ref 36.4–46.3)
WBC: 7.3 10*3/uL (ref 4.0–11.0)

## 2018-11-09 LAB — BASIC METABOLIC PANEL
Anion Gap: 4 mmol/L — ABNORMAL LOW (ref 5–15)
BUN: 11 mg/dl (ref 7–25)
CO2: 28 mEq/L (ref 21–32)
Calcium: 9.2 mg/dl (ref 8.5–10.1)
Chloride: 107 mEq/L (ref 98–107)
Creatinine: 0.7 mg/dl (ref 0.6–1.3)
EGFR IF NonAfrican American: >60
GFR African American: >60
Glucose: 75 mg/dl (ref 74–106)
Potassium: 3.8 mEq/L (ref 3.5–5.1)
Sodium: 139 mEq/L (ref 136–145)

## 2018-11-09 MED ORDER — METHOCARBAMOL 500 MG TAB
500 mg | ORAL | Status: AC
Start: 2018-11-09 — End: 2018-11-09
  Administered 2018-11-09: 17:00:00 via ORAL

## 2018-11-09 MED ORDER — MORPHINE 4 MG/ML SYRINGE
4 mg/mL | INTRAMUSCULAR | Status: AC
Start: 2018-11-09 — End: 2018-11-09
  Administered 2018-11-09: 17:00:00 via INTRAVENOUS

## 2018-11-09 MED ORDER — SODIUM CHLORIDE 0.9 % IJ SYRG
Freq: Once | INTRAMUSCULAR | Status: AC
Start: 2018-11-09 — End: 2018-11-09
  Administered 2018-11-09: 18:00:00 via INTRAVENOUS

## 2018-11-09 MED ORDER — ONDANSETRON (PF) 4 MG/2 ML INJECTION
4 mg/2 mL | Freq: Once | INTRAMUSCULAR | Status: AC
Start: 2018-11-09 — End: 2018-11-09
  Administered 2018-11-09: 17:00:00 via INTRAVENOUS

## 2018-11-09 MED ORDER — SODIUM CHLORIDE 0.9% BOLUS IV
0.9 % | INTRAVENOUS | Status: AC
Start: 2018-11-09 — End: 2018-11-09
  Administered 2018-11-09: 18:00:00 via INTRAVENOUS

## 2018-11-09 MED FILL — METHOCARBAMOL 500 MG TAB: 500 mg | ORAL | Qty: 2

## 2018-11-09 MED FILL — MORPHINE 4 MG/ML SYRINGE: 4 mg/mL | INTRAMUSCULAR | Qty: 1

## 2018-11-09 MED FILL — ONDANSETRON (PF) 4 MG/2 ML INJECTION: 4 mg/2 mL | INTRAMUSCULAR | Qty: 2

## 2018-11-09 NOTE — ED Notes (Signed)
Pt given incentive spirometer

## 2018-11-09 NOTE — ED Notes (Signed)
Pt requesting pain meds at this time for right rib pain.  No attending MD posted to chart yet.  Informed pt of the process.  Pt understands

## 2018-11-09 NOTE — ED Notes (Signed)
Pt taken off bedpan and sheets have been changed

## 2018-11-09 NOTE — ED Notes (Signed)
Pt given bedpan and helped onto bed pan.

## 2018-11-09 NOTE — ED Notes (Signed)
Went over discharge instructions with patient.  Patient given opportunity to ask questions.  Patient verbalized understanding with all instructions.

## 2018-11-09 NOTE — ED Notes (Signed)
Per medic pt c/o L rib pain since yesterday, pt stated she had a fall today was trying to get OOB lost balance and fell  Bruise on R hand noted Hx Lupus ,DM

## 2018-11-09 NOTE — ED Notes (Signed)
Pt given 3 blankets as per pts request

## 2018-11-09 NOTE — ED Provider Notes (Signed)
ED Provider Notes by Everardo All, PA at 11/09/18 1230                Author: Everardo All, PA  Service: EMERGENCY  Author Type: Physician Assistant       Filed: 11/09/18 1519  Date of Service: 11/09/18 1230  Status: Attested           Editor: Everardo All, PA (Physician Assistant)  Cosigner: Erling Conte, MD at 11/09/18 1543          Attestation signed by Erling Conte, MD at 11/09/18 1543          IErling Conte, M.D., interviewed and examined the patient. I discussed with the mid-level provider and agree with the evaluation and plans as documented  here.      Mechanical fall yesterday.  Left chest wall pain and has 10th rib fracture.  Patient with chronic pain secondary to lupus and fibromyalgia followed by pain management and on chronic opioid pain medications.  Symptomatic management as outpatient and follow-up  with primary care.                                 [facility]   Emergency Department Treatment Report          Patient: [name] [name]  Age: 68 y.o.  Sex: female          Date of Birth: 1950-09-12  Admit Date: 11/09/2018  PCP: Coralie Carpen, MD         MRN: [id number]   CSN: [id number]            Room: ER41/ER41  Time Dictated: 12:30 PM          Attending Physician: Erling Conte, MD   Physician Assistant: Asencion Islam      Chief Complaint      Chief Complaint       Patient presents with        ?  Rib Pain             History of Present Illness     68 y.o. female  with history of lupus, fibromyalgia, chronic pain who presents to the ED via medic from home complaining of 10 out of 10 left lower rib pain that yesterday and worse this morning.  She was trying to get up to go to the bathroom when she fell.  Patient  has a bruise on her right hand.  Denies hitting her head or losing consciousness.  She is already taking her Flexeril and OxyContin with no relief.  She is unable to take Tylenol due to an allergy.      Patient also complaining of nausea,  vomiting, diarrhea that started last yesterday.  She had 2 episodes of nonbloody emesis and 2 episodes of nonbloody diarrhea.  Denies fevers or chills, abdominal pain.        Review of Systems     Constitutional: No fever or chills   Eyes: No visual symptoms   ENT: No sore throat, runny nose, or other URI symptoms   Respiratory: No cough or shortness of breath   Cardiovascular: No chest pain   Gastrointestinal: as noted above   Genitourinary: No dysuria or hematuria   Musculoskeletal: No swelling   Integumentary: No rashes   Neurological: No headaches   Psychiatric: No HI or SI  Past Medical/Surgical History          Past Medical History:        Diagnosis  Date         ?  Acute hypercapnic respiratory failure (HCC)  02/05/2013          W/ Encephalopathy requiring BiPAP, Etiology Uncertain however Possible Narcotic Benzodiazipine Related?          ?  Adjustment disorder with mixed anxiety and depressed mood       ?  Aneurysm of internal carotid artery       ?  Arthritis of knee       ?  Asthma       ?  Autoimmune disease (HCC)       ?  CAD (coronary artery disease)       ?  Cervicalgia       ?  Chest pain  03/17/2014          Dobutamine Stress Echo: NORMAL WALL MOTION AND GLOBAL SYSTOLIC FUNCTION AT REST AND AFTER?? STRESS/EXERCISE WITH APPROPRIATE AUGMENTATION OF FUNCTION  IN ALL SEGMENTS. NO EVIDENCE OF INDUCIBLE ISCHEMIA AT ADEQUATE HEART RATE WITH DOBUTAMINE STRESS.??                  ?  Chronic low back pain  10/22/2014          [facility] MRI Lumbar w/o contrast: Moderate facet arthropathy at L4-L5 and L5-S1. Mild/moderate foraminal stenosis at these levels. Disc bulge with annular  tear at L4-L5.          ?  Chronic pain syndrome       ?  Coccydynia       ?  Diabetes mellitus            NIDDM         ?  DVT (deep venous thrombosis) (HCC)  05/19/2007          [facility] CTA: 1. Small nonocclusive pulmonary embolus in a subsegmental branch to the left lower lobe. Larger filling defect in a segmental branch to  the right  lower lobe, not as low in attenuation as is normally seen with a pulmonary embolus, however is seen in multiple planes and remains concerning for an additional nonocclusive pulmonary embolus.         ?  Fibromyalgia       ?  Gastric ulcer       ?  Gastritis  01/03/2015          [facility] Admission, EGD + H. Pylori/Gastritis         ?  Generalized osteoarthritis of multiple sites       ?  GI bleed  01/01/2015          [facility] Admit 5/2-01/07/2015         ?  H. pylori infection  01/03/2015          [facility] Admission, EGD + H. Pylori/Gastritis         ?  Hemiparesis, right (HCC)       ?  Hyperlipidemia       ?  Hypertension       ?  Irregular sleep-wake rhythm       ?  Knee joint pain       ?  Lumbar spondylosis            Orthopedic Spine Surgeon: Dr. Ree Kida L. Henrene Hawking          ?  Muscle spasm       ?  Normocytic anemia            Chronic Iron Deficiency Anemia         ?  Osteoporosis       ?  Pancreatitis       ?  Peripheral neuropathy       ?  Pulmonary embolism (HCC)            Multiple         ?  Seizures (HCC)       ?  Sensory ataxia       ?  SLE (systemic lupus erythematosus) (HCC)       ?  Stroke [facility])       ?  Subclinical hypothyroidism       ?  Vertigo           ?  Vitamin D deficiency            Past Surgical History:         Procedure  Laterality  Date          ?  CARDIAC SURG PROCEDURE UNLIST              Cardiac Cath PCI w/ Stents          ?  HX CHOLECYSTECTOMY         ?  HX GI              Partial Gastrectomy           ?  HX GI    01/03/2015          [facility] EGD by Dr. Smitty Cords D. Waldholtz: S/P B-2 Gastric Resection, Gastritis in small remnant, Bx of Jejunum r/o Celiac Disease and of Stomach. + Gastritis  + H. Pylori          ?  HX HYSTERECTOMY                 Social History          Social History          Socioeconomic History         ?  Marital status:  DIVORCED              Spouse name:  Not on file         ?  Number of children:  Not on file     ?  Years of education:  Not on file     ?  Highest education level:  Not on  file       Tobacco Use         ?  Smoking status:  Former Smoker     ?  Smokeless tobacco:  Never Used       Substance and Sexual Activity         ?  Alcohol use:  No     ?  Drug use:  No     ?  Sexual activity:  Not Currently              Partners:  Male             Family History          Family History         Problem  Relation  Age of Onset          ?  Diabetes  Maternal Grandmother       ?  Heart Disease  Maternal Grandmother            ?  Heart Disease  Mother               Home Medications          Prior to Admission Medications     Prescriptions  Last Dose  Informant  Patient Reported?  Taking?      Cholecalciferol, Vitamin D3, (VITAMIN D) 1,000 unit Cap      Yes  No      Sig: Take 50,000 Units by mouth every Sunday.      apixaban (ELIQUIS) 5 mg (74 tabs) starter pack      No  No      Sig: Take 10 mg (two 5 mg tablets) by mouth twice a day for 7 days    Followed by 5 mg (one 5 mg tablet) by mouth twice a day      ascorbic acid, vitamin C, (VITAMIN C) 250 mg tablet      No  No      Sig: Take 1 Tab by mouth two (2) times a day. Take with iron pill      calcium carbonate (CALTREX) 600 mg calcium (1,500 mg) tablet      Yes  No      Sig: Take 600 mg by mouth daily.      cyclobenzaprine (FLEXERIL) 10 mg tablet      Yes  No      Sig: Take 10 mg by mouth three (3) times daily as needed for Muscle Spasm(s).      lamoTRIgine (LAMICTAL) 100 mg tablet      No  No      Sig: Take 1 Tab by mouth two (2) times a day.      omeprazole (PRILOSEC) 20 mg capsule      Yes  No      Sig: Take 20 mg by mouth daily.      ondansetron hcl (ZOFRAN) 4 mg tablet      No  No      Sig: Take 1 Tab by mouth every eight (8) hours as needed for Nausea.      oxyCODONE IR (OXY-IR) 15 mg immediate release tablet      No  No      Sig: Take 1 Tab by mouth four (4) times daily. Max Daily Amount: 60 mg.      simvastatin (ZOCOR) 80 mg tablet      Yes  No      Sig: Take 80 mg by mouth nightly.      sucralfate (CARAFATE) 100 mg/mL suspension      Yes  No       Sig: Take 1 tsp by mouth two (2) times a day.               Facility-Administered Medications: None             Allergies          Allergies        Allergen  Reactions         ?  Tape [Adhesive]  Itching             Paper tape             ?  Aspirin  Not Reported This Time             Because of hx ulcers per patient         ?  Opana [Oxymorphone]  Rash and Itching         ?  Tylenol [Acetaminophen]  Itching             Physical Exam        Visit Vitals      BP  142/74 (BP 1 Location: Left arm, BP Patient Position: Sitting)     Pulse  83     Temp  98 ??F (36.7 ??C)     Resp  20     Ht   (1.702 m)     Wt  64.9 kg (143 lb)     SpO2  100%        BMI  22.40 kg/m??        General appearance: Well developed, well nourished female  lying in bed.  Family at bedside   Eyes: Conjunctivae clear, non-icteric   ENT: Mouth/throat: surfaces  of the pharynx, palate, and tongue are pink, moist, and without lesions   Respiratory: Clear to auscultation bilaterally, unlabored breathing, speaking full sentences   Cardiovascular: Regular rate and rhythm   GI: Abdomen is soft, non-tender   Musculoskeletal: Patient able to move all four extremities. No peripheral edema or posterior calf tenderness bilaterally. TTP of the left lower  chest wall, flank, and left parathoracic region, no crepitus or ecchymosis. RUE: FROM, NVI distally, no focal bony tenderness of the shoulder, elbow or wrist, diffuse tenderness on dorsum of hand with overlying ecchymosis and mild edema   Skin: Warm and dry without rashes   Neurologic: Alert, oriented. Answers questions appropriately        Impression and Management Plan     This is a 68 y.o.  female with history of lupus, fibromyalgia, chronic pain who presents to the ED with multiple complaints including left lower rib pain, right hand pain, nausea and vomiting.  Will obtain appropriate  labs, plain films to rule out acute fracture and treat her symptomatically        Diagnostic Studies     Lab:       Labs Reviewed       CBC WITH AUTOMATED DIFF - Abnormal; Notable for the following components:            Result  Value            HGB  9.4 (*)         HCT  33.0 (*)         MCV  74.7 (*)         MCH  21.3 (*)         MCHC  28.5 (*)         MPV  10.2 (*)         RDW-SD  57.2 (*)         NEUTROPHILS  72.3 (*)         LYMPHOCYTES  20.5 (*)            All other components within normal limits       METABOLIC PANEL, BASIC - Abnormal; Notable for the following components:            Anion gap  4 (*)            All other components within normal limits       POC URINE MACROSCOPIC - Abnormal; Notable for the following components:            Leukocyte Esterase  Small (*)  All other components within normal limits       POC URINE MICROSCOPIC        Imaging:     Xr Hand Rt Min 3 V      Result Date: 11/09/2018   Indication: Pain post fall 3 views right hand show narrowing of the radiocarpal joint and first carpometacarpal joint with osteophytes at their margins. No fracture or dislocation.       IMPRESSION: Degenerative joint disease. No acute abnormality.       Xr Ribs Lt W Pa Cxr Min 3 V      Result Date: 11/09/2018   Indication: Left rib pain post injury 7 views of the chest and left ribs show normal heart size and clear lungs with no pneumothorax. Staples throughout upper abdomen. Minimally displaced lateral fracture left 10th rib.       IMPRESSION: Minimally displaced acute left 10th rib fracture. Postsurgical changes upper abdomen bilaterally.         ED Course     Patient remained stable while in the ER.  Discussed results with the patient.  CBC with no leukocytosis and mild chronic anemia.  BMP unremarkable.  Urinalysis appears  contaminated.  X-ray results as noted above.  Patient received symptomatic treatment.  Patient provided an incentive spirometer.  Patient already on OxyContin and Flexeril at home.  Patient unable to take NSAIDs because she is on anticoagulants.  Instructed  patient to follow-up with Dr.  Liz BeachSnellings.  Continue to take pain medication and muscle relaxants at home.  Use parameters discussed. Return to the ER if condition worsens or new symptoms develop.  Patient agreed to the plan and all of her questions were  answered     Medications       sodium chloride (NS) flush 5-10 mL (10 mL IntraVENous Given 11/09/18 1330)     methocarbamoL (ROBAXIN) tablet 1,000 mg (1,000 mg Oral Given 11/09/18 1248)     sodium chloride 0.9 % bolus infusion 1,000 mL (1,000 mL IntraVENous New Bag 11/09/18 1340)     ondansetron (ZOFRAN) injection 4 mg (4 mg IntraVENous Given 11/09/18 1328)       morphine injection 4 mg (4 mg IntraVENous Given 11/09/18 1328)          Final Diagnosis                 ICD-10-CM  ICD-9-CM          1.  Closed fracture of one rib of left side, initial encounter  S22.32XA  807.01     2.  Fall, initial encounter  W19.XXXA  E888.9     3.  Contusion of right hand, initial encounter  S60.221A  923.20          4.  Non-intractable vomiting with nausea, unspecified vomiting type  R11.2  787.01          Disposition     Patient discharged home in stable condition with the instructions stated above.          The patient was personally evaluated by myself and HUGHES, MARK C, MD who agrees with the above assessment and plan         Asencion IslamAnnalyn Tobey Schmelzle, PA-C   November 09, 2018         [facility] Center[facility]agon medical dictation software was used for portions of this report. Unintended errors may occur.          My signature above authenticates this document and my orders, the final ??  diagnosis (es), discharge prescription (s), and instructions in the Epic ??   record.   If you have any questions please contact [phone].   ??   Nursing notes have been reviewed by the physician/ advanced practice ??   Clinician.

## 2018-11-09 NOTE — ED Provider Notes (Signed)
[facility]  Emergency Department Treatment Report    Patient: [name] [name] Age: 68 y.o. Sex: female    Date of Birth: 05-29-1951 Admit Date: 11/09/2018 PCP: Coralie Carpen, MD   MRN: [id number]  CSN: [id number]     Room: ER41/ER41 Time Dictated: 12:30 PM      Attending Physician: Erling Conte, MD  Physician Assistant: Asencion Islam    Chief Complaint   Chief Complaint   Patient presents with   ??? Rib Pain       History of Present Illness   68 y.o. female with history of lupus, fibromyalgia, chronic pain who presents to the ED via medic from home complaining of 10 out of 10 left lower rib pain that yesterday and worse this morning.  She was trying to get up to go to the bathroom when she fell.  Patient has a bruise on her right hand.  Denies hitting her head or losing consciousness.  She is already taking her Flexeril and OxyContin with no relief.  She is unable to take Tylenol due to an allergy.    Patient also complaining of nausea, vomiting, diarrhea that started last yesterday.  She had 2 episodes of nonbloody emesis and 2 episodes of nonbloody diarrhea.  Denies fevers or chills, abdominal pain.    Review of Systems   Constitutional: No fever or chills  Eyes: No visual symptoms  ENT: No sore throat, runny nose, or other URI symptoms  Respiratory: No cough or shortness of breath  Cardiovascular: No chest pain  Gastrointestinal: as noted above  Genitourinary: No dysuria or hematuria  Musculoskeletal: No swelling  Integumentary: No rashes  Neurological: No headaches  Psychiatric: No HI or SI    Past Medical/Surgical History     Past Medical History:   Diagnosis Date   ??? Acute hypercapnic respiratory failure (HCC) 02/05/2013    W/ Encephalopathy requiring BiPAP, Etiology Uncertain however Possible Narcotic Benzodiazipine Related?    ??? Adjustment disorder with mixed anxiety and depressed mood    ??? Aneurysm of internal carotid artery    ??? Arthritis of knee    ??? Asthma     ??? Autoimmune disease (HCC)    ??? CAD (coronary artery disease)    ??? Cervicalgia    ??? Chest pain 03/17/2014    Dobutamine Stress Echo: NORMAL WALL MOTION AND GLOBAL SYSTOLIC FUNCTION AT REST AND AFTER?? STRESS/EXERCISE WITH APPROPRIATE AUGMENTATION OF FUNCTION IN ALL SEGMENTS. NO EVIDENCE OF INDUCIBLE ISCHEMIA AT ADEQUATE HEART RATE WITH DOBUTAMINE STRESS.??            ??? Chronic low back pain 10/22/2014    [facility] MRI Lumbar w/o contrast: Moderate facet arthropathy at L4-L5 and L5-S1. Mild/moderate foraminal stenosis at these levels. Disc bulge with annular tear at L4-L5.    ??? Chronic pain syndrome    ??? Coccydynia    ??? Diabetes mellitus     NIDDM   ??? DVT (deep venous thrombosis) (HCC) 05/19/2007    [facility] CTA: 1. Small nonocclusive pulmonary embolus in a subsegmental branch to the left lower lobe. Larger filling defect in a segmental branch to the right lower lobe, not as low in attenuation as is normally seen with a pulmonary embolus, however is seen in multiple planes and remains concerning for an additional nonocclusive pulmonary embolus.   ??? Fibromyalgia    ??? Gastric ulcer    ??? Gastritis 01/03/2015    [facility] Admission, EGD + H. Pylori/Gastritis   ??? Generalized  osteoarthritis of multiple sites    ??? GI bleed 01/01/2015    [facility] Admit 5/2-01/07/2015   ??? H. pylori infection 01/03/2015    [facility] Admission, EGD + H. Pylori/Gastritis   ??? Hemiparesis, right (HCC)    ??? Hyperlipidemia    ??? Hypertension    ??? Irregular sleep-wake rhythm    ??? Knee joint pain    ??? Lumbar spondylosis     Orthopedic Spine Surgeon: Dr. Ree Kida L. Henrene Hawking    ??? Muscle spasm    ??? Normocytic anemia     Chronic Iron Deficiency Anemia   ??? Osteoporosis    ??? Pancreatitis    ??? Peripheral neuropathy    ??? Pulmonary embolism (HCC)     Multiple   ??? Seizures (HCC)    ??? Sensory ataxia    ??? SLE (systemic lupus erythematosus) (HCC)    ??? Stroke [facility])    ??? Subclinical hypothyroidism    ??? Vertigo    ??? Vitamin D deficiency      Past Surgical History:   Procedure Laterality Date    ??? CARDIAC SURG PROCEDURE UNLIST      Cardiac Cath PCI w/ Stents   ??? HX CHOLECYSTECTOMY     ??? HX GI      Partial Gastrectomy    ??? HX GI  01/03/2015    [facility] EGD by Dr. Smitty Cords D. Waldholtz: S/P B-2 Gastric Resection, Gastritis in small remnant, Bx of Jejunum r/o Celiac Disease and of Stomach. + Gastritis + H. Pylori   ??? HX HYSTERECTOMY         Social History     Social History     Socioeconomic History   ??? Marital status: DIVORCED     Spouse name: Not on file   ??? Number of children: Not on file   ??? Years of education: Not on file   ??? Highest education level: Not on file   Tobacco Use   ??? Smoking status: Former Smoker   ??? Smokeless tobacco: Never Used   Substance and Sexual Activity   ??? Alcohol use: No   ??? Drug use: No   ??? Sexual activity: Not Currently     Partners: Male       Family History     Family History   Problem Relation Age of Onset   ??? Diabetes Maternal Grandmother    ??? Heart Disease Maternal Grandmother    ??? Heart Disease Mother        Home Medications     Prior to Admission Medications   Prescriptions Last Dose Informant Patient Reported? Taking?   Cholecalciferol, Vitamin D3, (VITAMIN D) 1,000 unit Cap   Yes No   Sig: Take 50,000 Units by mouth every Sunday.   apixaban (ELIQUIS) 5 mg (74 tabs) starter pack   No No   Sig: Take 10 mg (two 5 mg tablets) by mouth twice a day for 7 days   Followed by 5 mg (one 5 mg tablet) by mouth twice a day   ascorbic acid, vitamin C, (VITAMIN C) 250 mg tablet   No No   Sig: Take 1 Tab by mouth two (2) times a day. Take with iron pill   calcium carbonate (CALTREX) 600 mg calcium (1,500 mg) tablet   Yes No   Sig: Take 600 mg by mouth daily.   cyclobenzaprine (FLEXERIL) 10 mg tablet   Yes No   Sig: Take 10 mg by mouth three (3) times daily as needed for  Muscle Spasm(s).   lamoTRIgine (LAMICTAL) 100 mg tablet   No No   Sig: Take 1 Tab by mouth two (2) times a day.   omeprazole (PRILOSEC) 20 mg capsule   Yes No   Sig: Take 20 mg by mouth daily.    ondansetron hcl (ZOFRAN) 4 mg tablet   No No   Sig: Take 1 Tab by mouth every eight (8) hours as needed for Nausea.   oxyCODONE IR (OXY-IR) 15 mg immediate release tablet   No No   Sig: Take 1 Tab by mouth four (4) times daily. Max Daily Amount: 60 mg.   simvastatin (ZOCOR) 80 mg tablet   Yes No   Sig: Take 80 mg by mouth nightly.   sucralfate (CARAFATE) 100 mg/mL suspension   Yes No   Sig: Take 1 tsp by mouth two (2) times a day.      Facility-Administered Medications: None       Allergies     Allergies   Allergen Reactions   ??? Tape [Adhesive] Itching     Paper tape      ??? Aspirin Not Reported This Time     Because of hx ulcers per patient   ??? Opana [Oxymorphone] Rash and Itching   ??? Tylenol [Acetaminophen] Itching       Physical Exam     Visit Vitals  BP 142/74 (BP 1 Location: Left arm, BP Patient Position: Sitting)   Pulse 83   Temp 98 ??F (36.7 ??C)   Resp 20   Ht  (1.702 m)   Wt 64.9 kg (143 lb)   SpO2 100%   BMI 22.40 kg/m??     General appearance: Well developed, well nourished female lying in bed.  Family at bedside  Eyes: Conjunctivae clear, non-icteric  ENT: Mouth/throat: surfaces of the pharynx, palate, and tongue are pink, moist, and without lesions  Respiratory: Clear to auscultation bilaterally, unlabored breathing, speaking full sentences  Cardiovascular: Regular rate and rhythm  GI: Abdomen is soft, non-tender  Musculoskeletal: Patient able to move all four extremities. No peripheral edema or posterior calf tenderness bilaterally. TTP of the left lower chest wall, flank, and left parathoracic region, no crepitus or ecchymosis. RUE: FROM, NVI distally, no focal bony tenderness of the shoulder, elbow or wrist, diffuse tenderness on dorsum of hand with overlying ecchymosis and mild edema  Skin: Warm and dry without rashes  Neurologic: Alert, oriented. Answers questions appropriately    Impression and Management Plan   This is a 68 y.o. female with history of lupus, fibromyalgia, chronic pain  who presents to the ED with multiple complaints including left lower rib pain, right hand pain, nausea and vomiting.  Will obtain appropriate labs, plain films to rule out acute fracture and treat her symptomatically    Diagnostic Studies   Lab:   Labs Reviewed   CBC WITH AUTOMATED DIFF - Abnormal; Notable for the following components:       Result Value    HGB 9.4 (*)     HCT 33.0 (*)     MCV 74.7 (*)     MCH 21.3 (*)     MCHC 28.5 (*)     MPV 10.2 (*)     RDW-SD 57.2 (*)     NEUTROPHILS 72.3 (*)     LYMPHOCYTES 20.5 (*)     All other components within normal limits   METABOLIC PANEL, BASIC - Abnormal; Notable for the following components:    Anion gap  4 (*)     All other components within normal limits   POC URINE MACROSCOPIC - Abnormal; Notable for the following components:    Leukocyte Esterase Small (*)     All other components within normal limits   POC URINE MICROSCOPIC     Imaging:    Xr Hand Rt Min 3 V    Result Date: 11/09/2018  Indication: Pain post fall 3 views right hand show narrowing of the radiocarpal joint and first carpometacarpal joint with osteophytes at their margins. No fracture or dislocation.     IMPRESSION: Degenerative joint disease. No acute abnormality.     Xr Ribs Lt W Pa Cxr Min 3 V    Result Date: 11/09/2018  Indication: Left rib pain post injury 7 views of the chest and left ribs show normal heart size and clear lungs with no pneumothorax. Staples throughout upper abdomen. Minimally displaced lateral fracture left 10th rib.     IMPRESSION: Minimally displaced acute left 10th rib fracture. Postsurgical changes upper abdomen bilaterally.     ED Course   Patient remained stable while in the ER.  Discussed results with the patient.  CBC with no leukocytosis and mild chronic anemia.  BMP unremarkable.  Urinalysis appears contaminated.  X-ray results as noted above.  Patient received symptomatic treatment.  Patient provided an  incentive spirometer.  Patient already on OxyContin and Flexeril at home.  Patient unable to take NSAIDs because she is on anticoagulants.  Instructed patient to follow-up with Dr. Liz Beach.  Continue to take pain medication and muscle relaxants at home.  Use parameters discussed. Return to the ER if condition worsens or new symptoms develop.  Patient agreed to the plan and all of her questions were answered  Medications   sodium chloride (NS) flush 5-10 mL (10 mL IntraVENous Given 11/09/18 1330)   methocarbamoL (ROBAXIN) tablet 1,000 mg (1,000 mg Oral Given 11/09/18 1248)   sodium chloride 0.9 % bolus infusion 1,000 mL (1,000 mL IntraVENous New Bag 11/09/18 1340)   ondansetron (ZOFRAN) injection 4 mg (4 mg IntraVENous Given 11/09/18 1328)   morphine injection 4 mg (4 mg IntraVENous Given 11/09/18 1328)     Final Diagnosis       ICD-10-CM ICD-9-CM   1. Closed fracture of one rib of left side, initial encounter S22.32XA 807.01   2. Fall, initial encounter W19.XXXA E888.9   3. Contusion of right hand, initial encounter S60.221A 923.20   4. Non-intractable vomiting with nausea, unspecified vomiting type R11.2 787.01     Disposition   Patient discharged home in stable condition with the instructions stated above.       The patient was personally evaluated by myself and HUGHES, MARK C, MD who agrees with the above assessment and plan      Asencion Islam, PA-C  November 09, 2018      [facility] medical dictation software was used for portions of this report. Unintended errors may occur.       My signature above authenticates this document and my orders, the final ??  diagnosis (es), discharge prescription (s), and instructions in the Epic ??  record.  If you have any questions please contact [phone].  ??  Nursing notes have been reviewed by the physician/ advanced practice ??  Clinician.

## 2018-11-09 NOTE — ED Notes (Signed)
Pt taken off bedpan and sheets have been changed

## 2018-11-09 NOTE — ED Notes (Signed)
Pt requesting pain meds at this time for right rib pain.  No attending MD posted to chart yet.  Informed pt of the process.  Pt understands

## 2018-11-09 NOTE — ED Triage Notes (Addendum)
Per medic pt c/o L rib pain since yesterday, pt stated she had a fall today was trying to get OOB lost balance and fell  Bruise on R hand noted Hx Lupus ,DM

## 2018-11-09 NOTE — ED Notes (Signed)
Pt given bedpan and helped onto bed pan.

## 2018-11-09 NOTE — ED Notes (Signed)
Pt given 3 blankets as per pts request

## 2019-02-06 ENCOUNTER — Emergency Department: Admit: 2019-02-06 | Payer: PRIVATE HEALTH INSURANCE | Primary: Internal Medicine

## 2019-02-06 ENCOUNTER — Inpatient Hospital Stay
Admit: 2019-02-06 | Discharge: 2019-02-07 | Disposition: A | Discharge status: Home / Self Care | Payer: PRIVATE HEALTH INSURANCE | Attending: Emergency Medicine

## 2019-02-06 DIAGNOSIS — R072 Precordial pain: Secondary | ICD-10-CM

## 2019-02-06 LAB — TROPONIN I: Troponin-I: <0.015 ng/ml (ref 0.000–0.045)

## 2019-02-06 LAB — METABOLIC PANEL, COMPREHENSIVE
ALT (SGPT): 27 U/L (ref 12–78)
AST (SGOT): 35 U/L (ref 15–37)
Albumin: 3.7 gm/dl (ref 3.4–5.0)
Alk. phosphatase: 100 U/L (ref 45–117)
Anion gap: 4 mmol/L — ABNORMAL LOW (ref 5–15)
BUN: 8 mg/dl (ref 7–25)
Bilirubin, total: 0.3 mg/dl (ref 0.2–1.0)
CO2: 26 mEq/L (ref 21–32)
Calcium: 8.9 mg/dl (ref 8.5–10.1)
Chloride: 108 mEq/L — ABNORMAL HIGH (ref 98–107)
Creatinine: 0.9 mg/dl (ref 0.6–1.3)
GFR est AA: >60
GFR est non-AA: >60
Glucose: 72 mg/dl — ABNORMAL LOW (ref 74–106)
Potassium: 4.6 mEq/L (ref 3.5–5.1)
Protein, total: 8.2 gm/dl (ref 6.4–8.2)
Sodium: 138 mEq/L (ref 136–145)

## 2019-02-06 LAB — CBC WITH AUTOMATED DIFF
BASOPHILS: 1.2 % (ref 0–3)
EOSINOPHILS: 3.2 % (ref 0–5)
HCT: 32.3 % — ABNORMAL LOW (ref 37.0–50.0)
HGB: 9.3 gm/dl — ABNORMAL LOW (ref 13.0–17.2)
IMMATURE GRANULOCYTES: 0.1 % (ref 0.0–3.0)
LYMPHOCYTES: 26.2 % — ABNORMAL LOW (ref 28–48)
MCH: 21.8 pg — ABNORMAL LOW (ref 25.4–34.6)
MCHC: 28.8 gm/dl — ABNORMAL LOW (ref 30.0–36.0)
MCV: 75.8 fL — ABNORMAL LOW (ref 80.0–98.0)
MONOCYTES: 7.1 % (ref 1–13)
MPV: 11.6 fL — ABNORMAL HIGH (ref 6.0–10.0)
NEUTROPHILS: 62.2 % (ref 34–64)
NRBC: 0 (ref 0–0)
PLATELET: 321 10*3/uL (ref 140–450)
RBC: 4.26 M/uL (ref 3.60–5.20)
RDW-SD: 52.2 — ABNORMAL HIGH (ref 36.4–46.3)
WBC: 6.8 10*3/uL (ref 4.0–11.0)

## 2019-02-06 LAB — T4, FREE
FREE T4, FT4T: 1.06 ng/dl (ref 0.76–1.46)
Free T4: 1.06 ng/dl (ref 0.76–1.46)

## 2019-02-06 LAB — TSH 3RD GENERATION
TSH: 2.23 u[IU]/mL (ref 0.358–3.740)
TSH: 2.23 u[IU]/mL (ref 0.358–3.740)

## 2019-02-06 LAB — LACTIC ACID
LACTIC ACID: 1.6 mmol/L (ref 0.4–2.0)
Lactic Acid: 1.6 mmol/L (ref 0.4–2.0)

## 2019-02-06 LAB — NT-PRO BNP: NT pro-BNP: 42 pg/ml (ref 0.0–125.0)

## 2019-02-06 LAB — COMPREHENSIVE METABOLIC PANEL
ALT: 27 U/L (ref 12–78)
AST: 35 U/L (ref 15–37)
Albumin: 3.7 gm/dl (ref 3.4–5.0)
Alkaline Phosphatase: 100 U/L (ref 45–117)
Anion Gap: 4 mmol/L — ABNORMAL LOW (ref 5–15)
BUN: 8 mg/dl (ref 7–25)
CO2: 26 mEq/L (ref 21–32)
Calcium: 8.9 mg/dl (ref 8.5–10.1)
Chloride: 108 mEq/L — ABNORMAL HIGH (ref 98–107)
Creatinine: 0.9 mg/dl (ref 0.6–1.3)
EGFR IF NonAfrican American: >60
GFR African American: >60
Glucose: 72 mg/dl — ABNORMAL LOW (ref 74–106)
Potassium: 4.6 mEq/L (ref 3.5–5.1)
Sodium: 138 mEq/L (ref 136–145)
Total Bilirubin: 0.3 mg/dl (ref 0.2–1.0)
Total Protein: 8.2 gm/dl (ref 6.4–8.2)

## 2019-02-06 LAB — CBC WITH AUTO DIFFERENTIAL
Basophils %: 1.2 % (ref 0–3)
Eosinophils %: 3.2 % (ref 0–5)
Hematocrit: 32.3 % — ABNORMAL LOW (ref 37.0–50.0)
Hemoglobin: 9.3 gm/dl — ABNORMAL LOW (ref 13.0–17.2)
Immature Granulocytes: 0.1 % (ref 0.0–3.0)
Lymphocytes %: 26.2 % — ABNORMAL LOW (ref 28–48)
MCH: 21.8 pg — ABNORMAL LOW (ref 25.4–34.6)
MCHC: 28.8 gm/dl — ABNORMAL LOW (ref 30.0–36.0)
MCV: 75.8 fL — ABNORMAL LOW (ref 80.0–98.0)
MPV: 11.6 fL — ABNORMAL HIGH (ref 6.0–10.0)
Monocytes %: 7.1 % (ref 1–13)
Neutrophils %: 62.2 % (ref 34–64)
Nucleated RBCs: 0 (ref 0–0)
Platelets: 321 10*3/uL (ref 140–450)
RBC: 4.26 M/uL (ref 3.60–5.20)
RDW-SD: 52.2 — ABNORMAL HIGH (ref 36.4–46.3)
WBC: 6.8 10*3/uL (ref 4.0–11.0)

## 2019-02-06 LAB — PROBNP, N-TERMINAL: BNP: 42 pg/ml (ref 0.0–125.0)

## 2019-02-06 LAB — TROPONIN: Troponin I: <0.015 ng/ml (ref 0.000–0.045)

## 2019-02-06 MED ORDER — SODIUM CHLORIDE 0.9 % IJ SYRG
Freq: Once | INTRAMUSCULAR | Status: AC
Start: 2019-02-06 — End: 2019-02-07

## 2019-02-06 MED ORDER — SODIUM CHLORIDE 0.9% BOLUS IV
0.9 % | INTRAVENOUS | Status: AC
Start: 2019-02-06 — End: 2019-02-06
  Administered 2019-02-06: 23:00:00 via INTRAVENOUS

## 2019-02-06 NOTE — ED Notes (Signed)
TRANSFER - OUT REPORT:    Verbal report given to Angelique Blonder, Charity fundraiser (name) on [name] [name]  being transferred to [address] (unit) for routine progression of care       Report consisted of patient's Situation, Background, Assessment and   Recommendations(SBAR).     Information from the following report(s) SBAR was reviewed with the receiving nurse.    Lines:   Peripheral IV 02/06/19 Right Wrist (Active)        Opportunity for questions and clarification was provided.      Patient transported with:   Registered Nurse

## 2019-02-06 NOTE — H&P (Signed)
H&P by Knox Saliva, MD at 02/06/19 2130                Author: Knox Saliva, MD  Service: Hospitalist  Author Type: Physician       Filed: 02/07/19 0607  Date of Service: 02/06/19 2130  Status: Signed          Editor: Knox Saliva, MD (Physician)                       History and Physical      DOS: February 06, 2019      Patient: [name] [name]               Sex: female          DOA: 02/06/2019         Date of Birth:  29-Mar-1951      Age:  68 y.o.        LOS:  LOS: 0 days         MRN: [id number]            Impression/Plan           68 y.o. female with hx of HTN, HLD, CAD, CVA, Asthma, and DM with the following:           1.  Unstable Angina, suspected     2.  Precordial Chest Pain     3.  Near Syncope    4.  CAD s/p Stent    5.  HTN, poor control     6.  HLD    7.  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  Anemia    9.  DM II    10. Hx CVA   11. Asthma/COPD    12. Seizure Disorder       PLAN:       Admit to OBS, tele   O2 as needed.  GI and DVT prophylaxis.   Replace electrolytes as needed.     Serial cardiac enzymes.  Check 2D echo.   Monitor blood pressure.  Add PRN antihypertensive medications if blood pressure is not adequately controlled on home medications.   Monitor blood glucose.  Glucomander monitoring per protocol.   Continue Eliquis for anticoagulation.   Gentle hydration with IV fluids and monitor to avoid fluid overload.   Continue home medications when possible.   Further recommendations based on test results, response to treatment, and clinical course.           Chief Complaint:          Chief Complaint       Patient presents with        ?  Chest Pain        ?  Fatigue             HPI:        [name] [name] is a 68 y.o.  female with hx of HTN, HLD, CAD, CVA, Asthma, and DM who presents to the ED via EMS transport with complaints of having intermittent chest pain  and heaviness with associated shortness of breath.  Patient had an initial episode which occurred on 02/04/2019.  Her symptoms lasted  about 30 minutes and improved with rest.  Patient describes her chest pain is a pressure sensation.  The patient experienced  another episode while shopping at [facility] when the chest pain returned while she was checking out from  shopping.  She again felt left-sided chest pain with associated shortness of breath and generalized weakness.  She felt as if she was about to pass  out, so she sat down briefly.  She experienced cramping in her legs as well.  EMS was called for transport.  She has had intermittent chills but denies fever.  No nausea, vomiting, abdominal pain, or diarrhea.  Patient states the pain was improving by  time she arrived in the ED.           Past Medical History:        Diagnosis  Date         ?  Acute hypercapnic respiratory failure (HCC)  02/05/2013          W/ Encephalopathy requiring BiPAP, Etiology Uncertain however Possible Narcotic Benzodiazipine Related?          ?  Adjustment disorder with mixed anxiety and depressed mood       ?  Aneurysm of internal carotid artery       ?  Arthritis of knee       ?  Asthma       ?  Autoimmune disease ([place])       ?  CAD (coronary artery disease)       ?  Cervicalgia       ?  Chest pain  03/17/2014          Dobutamine Stress Echo: NORMAL WALL MOTION AND GLOBAL SYSTOLIC FUNCTION AT REST AND AFTER?? STRESS/EXERCISE WITH APPROPRIATE AUGMENTATION OF FUNCTION  IN ALL SEGMENTS. NO EVIDENCE OF INDUCIBLE ISCHEMIA AT ADEQUATE HEART RATE WITH DOBUTAMINE STRESS.??                  ?  Chronic low back pain  10/22/2014          [facility] MRI Lumbar w/o contrast: Moderate facet arthropathy at L4-L5 and L5-S1. Mild/moderate foraminal stenosis at these levels. Disc bulge with annular  tear at L4-L5.          ?  Chronic pain syndrome       ?  Coccydynia       ?  Diabetes mellitus            NIDDM         ?  DVT (deep venous thrombosis) ([place])  05/19/2007          [facility] CTA: 1. Small nonocclusive pulmonary embolus in a subsegmental branch to the left lower lobe. Larger filling  defect in a segmental branch to  the right lower lobe, not as low in attenuation as is normally seen with a pulmonary embolus, however is seen in multiple planes and remains concerning for an additional nonocclusive pulmonary embolus.         ?  Fibromyalgia       ?  Gastric ulcer       ?  Gastritis  01/03/2015          [place] Admission, EGD + H. Pylori/Gastritis         ?  Generalized osteoarthritis of multiple sites       ?  GI bleed  01/01/2015          [place] Admit 5/2-01/07/2015         ?  H. pylori infection  01/03/2015          [place] Admission, EGD + H. Pylori/Gastritis         ?  Hemiparesis, right ([place])       ?  Hyperlipidemia       ?  Hypertension       ?  Irregular sleep-wake rhythm       ?  Knee joint pain       ?  Lumbar spondylosis            Orthopedic Spine Surgeon: Dr. Barnabas Lister L. Sonny Masters          ?  Muscle spasm       ?  Normocytic anemia            Chronic Iron Deficiency Anemia         ?  Osteoporosis       ?  Pancreatitis       ?  Peripheral neuropathy       ?  Pulmonary embolism ([place])            Multiple         ?  Seizures ([place])       ?  Sensory ataxia       ?  SLE (systemic lupus erythematosus) ([place])       ?  Stroke [facility])       ?  Subclinical hypothyroidism       ?  Vertigo           ?  Vitamin D deficiency               Past Surgical History:         Procedure  Laterality  Date          ?  CARDIAC SURG PROCEDURE UNLIST              Cardiac Cath PCI w/ Stents          ?  HX CHOLECYSTECTOMY         ?  HX GI              Partial Gastrectomy           ?  HX GI    01/03/2015          [facility] EGD by Dr. Darnell Level D. Waldholtz: S/P B-2 Gastric Resection, Gastritis in small remnant, Bx of Jejunum r/o Celiac Disease and of Stomach. + Gastritis  + H. Pylori          ?  HX HYSTERECTOMY                 Family History         Problem  Relation  Age of Onset          ?  Diabetes  Maternal Grandmother       ?  Heart Disease  Maternal Grandmother            ?  Heart Disease  Mother               Social History           Socioeconomic History         ?  Marital status:  DIVORCED              Spouse name:  Not on file         ?  Number of children:  Not on file     ?  Years of education:  Not on file     ?  Highest education level:  Not on file       Tobacco Use         ?  Smoking status:  Former Smoker     ?  Smokeless tobacco:  Never Used       Substance and Sexual Activity         ?  Alcohol use:  No     ?  Drug use:  No     ?  Sexual activity:  Not Currently              Partners:  Male             Prior to Admission medications             Medication  Sig  Start Date  End Date  Taking?  Authorizing Provider            calcium carbonate (CALTREX) 600 mg calcium (1,500 mg) tablet  Take 600 mg by mouth daily.        Other, Phys, MD     apixaban (ELIQUIS) 5 mg (74 tabs) starter pack  Take 10 mg (two 5 mg tablets) by mouth twice a day for 7 days    Followed by 5 mg (one 5 mg tablet) by mouth twice a day  11/16/17      Ed[place] House, MD     omeprazole (PRILOSEC) 20 mg capsule  Take 20 mg by mouth daily.        Provider, Historical     ondansetron hcl (ZOFRAN) 4 mg tablet  Take 1 Tab by mouth every eight (8) hours as needed for Nausea.  02/20/17      Holladay, Lorra Hals, MD     cyclobenzaprine (FLEXERIL) 10 mg tablet  Take 10 mg by mouth three (3) times daily as needed for Muscle Spasm(s).        Provider, Historical     sucralfate (CARAFATE) 100 mg/mL suspension  Take 1 tsp by mouth two (2) times a day.        Provider, Historical     ascorbic acid, vitamin C, (VITAMIN C) 250 mg tablet  Take 1 Tab by mouth two (2) times a day. Take with iron pill  12/29/15      Elon Spanner, MD     oxyCODONE IR (OXY-IR) 15 mg immediate release tablet  Take 1 Tab by mouth four (4) times daily. Max Daily Amount: 60 mg.  07/20/15      Barkley Boards, DO     simvastatin (ZOCOR) 80 mg tablet  Take 80 mg by mouth nightly.        Other, Phys, MD     lamoTRIgine (LAMICTAL) 100 mg tablet  Take 1 Tab by mouth two (2) times a day.  01/07/15      Stanton Kidney, MD            Cholecalciferol, Vitamin D3, (VITAMIN D) 1,000 unit Cap  Take 50,000 Units by mouth every Sunday.        Provider, Historical             Allergies        Allergen  Reactions         ?  Tape [Adhesive]  Itching             Paper tape             ?  Aspirin  Not Reported This Time             Because of hx ulcers per patient         ?  Opana [Oxymorphone]  Rash and Itching         ?  Tylenol [Acetaminophen]  Itching           Review of Systems:      1) See above. 2) A 12 point review of systems has been obtained. ROS is otherwise noncontributory.           Physical Exam:         Visit Vitals      BP  120/77     Pulse  76     Temp  98.5 ??F (36.9 ??C)     Resp  16     Ht  '5\' 7"'  (1.702 m)     Wt  68 kg (150 lb)     SpO2  97%        BMI  23.49 kg/m??              Intake/Output Summary (Last 24 hours) at 02/06/2019 2130   Last data filed at 02/06/2019 2102     Gross per 24 hour        Intake  1000 ml        Output  --        Net  1000 ml           Physical Exam:      HEENT: NC/AT, PERRLA, EOMI, dry mucous membranes   Neck: No JVD no bruits, supple, nontender, no significant LAD   LYMPH: No supraclavicular or cervical or axillary nodes on both sides   Cardiovascular: Heart, RRR, no M, R, G   Lungs: Clear to auscultation, coarse upper airway sounds, no wheezes or crackles   Abd: Soft, non tender, not distended, No guarding, No rigidity, BS normal   NEURO:  Non focal, normal strength. CN II - XII intact bilaterally    Extrm: no leg edema    Skin: No rashes or lesions         Labs Reviewed:          Recent Results (from the past 24 hour(s))     CBC WITH AUTOMATED DIFF          Collection Time: 02/06/19  7:05 PM         Result  Value  Ref Range            WBC  6.8  4.0 - 11.0 1000/mm3       RBC  4.26  3.60 - 5.20 M/uL       HGB  9.3 (L)  13.0 - 17.2 gm/dl       HCT  32.3 (L)  37.0 - 50.0 %       MCV  75.8 (L)  80.0 - 98.0 fL       MCH  21.8 (L)  25.4 - 34.6 pg       MCHC  28.8 (L)  30.0 - 36.0 gm/dl       PLATELET   321  140 - 450 1000/mm3       MPV  11.6 (H)  6.0 - 10.0 fL       RDW-SD  52.2 (H)  36.4 - 46.3         NRBC  0  0 - 0         IMMATURE GRANULOCYTES  0.1  0.0 - 3.0 %       NEUTROPHILS  62.2  34 - 64 %       LYMPHOCYTES  26.2 (L)  28 - 48 %       MONOCYTES  7.1  1 - 13 %       EOSINOPHILS  3.2  0 - 5 %       BASOPHILS  1.2  0 - 3 %       METABOLIC PANEL, COMPREHENSIVE          Collection Time: 02/06/19  7:05 PM         Result  Value  Ref Range            Sodium  138  136 - 145 mEq/L       Potassium  4.6  3.5 - 5.1 mEq/L       Chloride  108 (H)  98 - 107 mEq/L       CO2  26  21 - 32 mEq/L       Glucose  72 (L)  74 - 106 mg/dl       BUN  8  7 - 25 mg/dl       Creatinine  0.9  0.6 - 1.3 mg/dl       GFR est AA  >60.0          GFR est non-AA  >60          Calcium  8.9  8.5 - 10.1 mg/dl       AST (SGOT)  35  15 - 37 U/L       ALT (SGPT)  27  12 - 78 U/L       Alk. phosphatase  100  45 - 117 U/L       Bilirubin, total  0.3  0.2 - 1.0 mg/dl       Protein, total  8.2  6.4 - 8.2 gm/dl       Albumin  3.7  3.4 - 5.0 gm/dl       Anion gap  4 (L)  5 - 15 mmol/L       LACTIC ACID          Collection Time: 02/06/19  7:05 PM         Result  Value  Ref Range            Lactic Acid  1.6  0.4 - 2.0 mmol/L       TROPONIN I          Collection Time: 02/06/19  7:05 PM         Result  Value  Ref Range            Troponin-I  <0.015  0.000 - 0.045 ng/ml       NT-PRO BNP          Collection Time: 02/06/19  7:05 PM         Result  Value  Ref Range            NT pro-BNP  42.0  0.0 - 125.0 pg/ml       T4, FREE          Collection Time: 02/06/19  7:05 PM         Result  Value  Ref Range            Free T4  1.06  0.76 - 1.46 ng/dl       TSH 3RD GENERATION          Collection Time: 02/06/19  7:05 PM         Result  Value  Ref Range            TSH  2.230  0.358 - 3.740 uIU/mL                   Diagnostic:        Xr Chest Sngl V      Result Date: 02/06/2019   EXAM:  Chest AP INDICATIONS: chest pain COMPARISON: 03/03/2018. FINDINGS: No consolidation,  pleural effusion, or pulmonary edema. No pneumothorax. Normal cardiomediastinal contours.       IMPRESSION: No acute cardiopulmonary process.       Dragon medical dictation software was used for portions of this report.  Efforts have been made to edit the dictations, but occasionally words are mis-transcribed.      Knox Saliva, MD   02/06/2019   9:30 PM

## 2019-02-06 NOTE — ED Provider Notes (Signed)
[facility]  Emergency Department Treatment Report    Patient: [name] [name] Age: 68 y.o. Sex: female    Date of Birth: 10/08/1950 Admit Date: 02/06/2019 PCP: [name], [name], MD   MRN: [id number]  CSN: [id number]     Room: ER11/ER11 Time Dictated: 10:50 PM      **I hereby certify this patient for admission based upon medical necessity as ??  noted below:    Chief Complaint   **Chest pain shortness of breath and weak all over*  History of Present Illness   68 y.o. female who had an episode Friday while she was cleaning when she felt some heaviness in her chest and became short of breath.  Lasted half an hour and got better with rest.  Sensation was a pressure sensation in her left chest which she had not had before.  She is felt still off yesterday and stating that all day and finally felt better this morning and decided to go shopping at [employer].  While she was there she developed this chest pain returning and she is ready to check out.  Is again on left side with shortness of breath and she felt weak all over.  She thought she was going to pass out at the counter.  She got so ill appearing that the paramedics were called to the scene.  She said she is never had heart problems that she is aware of like this.  Vital signs were stable upon EMS transport.  Pain is started to ease by time she arrived here.    Review of Systems   *Constitutional: No fever, chills, or weight loss  Eyes: No visual symptoms.  ENT: No sore throat, runny nose or ear pain.  Respiratory: No cough, dyspnea or wheezing.  Cardiovascular: *No palpitations**  Gastrointestinal: No vomiting, diarrhea or abdominal pain.  Genitourinary: No dysuria, frequency, or urgency.  Musculoskeletal: No specific muscle or joint pain or swelling.  Integumentary: No rashes.  Neurological: No headaches, sensory or motor symptoms.  Denies complaints in all other systems.  **    Past Medical/Surgical History     Past Medical History:   Diagnosis  Date   ??? Acute hypercapnic respiratory failure (HCC) 02/05/2013    W/ Encephalopathy requiring BiPAP, Etiology Uncertain however Possible Narcotic Benzodiazipine Related?    ??? Adjustment disorder with mixed anxiety and depressed mood    ??? Aneurysm of internal carotid artery    ??? Arthritis of knee    ??? Asthma    ??? Autoimmune disease (HCC)    ??? CAD (coronary artery disease)    ??? Cervicalgia    ??? Chest pain 03/17/2014    Dobutamine Stress Echo: NORMAL WALL MOTION AND GLOBAL SYSTOLIC FUNCTION AT REST AND AFTER?? STRESS/EXERCISE WITH APPROPRIATE AUGMENTATION OF FUNCTION IN ALL SEGMENTS. NO EVIDENCE OF INDUCIBLE ISCHEMIA AT ADEQUATE HEART RATE WITH DOBUTAMINE STRESS.??            ??? Chronic low back pain 10/22/2014    [name]: Moderate facet arthropathy at L4-L5 and L5-S1. Mild/moderate foraminal stenosis at these levels. Disc bulge with annular tear at L4-L5.    ??? Chronic pain syndrome    ??? Coccydynia    ??? Diabetes mellitus     NIDDM   ??? DVT (deep venous thrombosis) (HCC) 05/19/2007    [facility] CTA: 1. Small nonocclusive pulmonary embolus in a subsegmental branch to the left lower lobe. Larger filling defect in a segmental branch to the right  lower lobe, not as low in attenuation as is normally seen with a pulmonary embolus, however is seen in multiple planes and remains concerning for an additional nonocclusive pulmonary embolus.   ??? Fibromyalgia    ??? Gastric ulcer    ??? Gastritis 01/03/2015    [place] Admission, EGD + H. Pylori/Gastritis   ??? Generalized osteoarthritis of multiple sites    ??? GI bleed 01/01/2015    [place] Admit 5/2-01/07/2015   ??? H. pylori infection 01/03/2015    [place] Admission, EGD + H. Pylori/Gastritis   ??? Hemiparesis, right ([place])    ??? Hyperlipidemia    ??? Hypertension    ??? Irregular sleep-wake rhythm    ??? Knee joint pain    ??? Lumbar spondylosis     Orthopedic Spine Surgeon: Dr. Barnabas Lister L. Sonny Masters    ??? Muscle spasm    ??? Normocytic anemia     Chronic Iron Deficiency Anemia   ??? Osteoporosis    ???  Pancreatitis    ??? Peripheral neuropathy    ??? Pulmonary embolism (HCC)     Multiple   ??? Seizures ([place])    ??? Sensory ataxia    ??? SLE (systemic lupus erythematosus) ([place])    ??? Stroke [facility])    ??? Subclinical hypothyroidism    ??? Vertigo    ??? Vitamin D deficiency      Past Surgical History:   Procedure Laterality Date   ??? CARDIAC SURG PROCEDURE UNLIST      Cardiac Cath PCI w/ Stents   ??? HX CHOLECYSTECTOMY     ??? HX GI      Partial Gastrectomy    ??? HX GI  01/03/2015    [facility] EGD by Dr. Darnell Level D. Waldholtz: S/P B-2 Gastric Resection, Gastritis in small remnant, Bx of Jejunum r/o Celiac Disease and of Stomach. + Gastritis + H. Pylori   ??? HX HYSTERECTOMY       **  Social History     Social History     Socioeconomic History   ??? Marital status: DIVORCED     Spouse name: Not on file   ??? Number of children: Not on file   ??? Years of education: Not on file   ??? Highest education level: Not on file   Tobacco Use   ??? Smoking status: Former Smoker   ??? Smokeless tobacco: Never Used   Substance and Sexual Activity   ??? Alcohol use: No   ??? Drug use: No   ??? Sexual activity: Not Currently     Partners: Male       Family History     Family History   Problem Relation Age of Onset   ??? Diabetes Maternal Grandmother    ??? Heart Disease Maternal Grandmother    ??? Heart Disease Mother        Current Medications     Prior to Admission Medications   Prescriptions Last Dose Informant Patient Reported? Taking?   Cholecalciferol, Vitamin D3, (VITAMIN D) 1,000 unit Cap   Yes No   Sig: Take 50,000 Units by mouth every Sunday.   apixaban (ELIQUIS) 5 mg (74 tabs) starter pack   No No   Sig: Take 10 mg (two 5 mg tablets) by mouth twice a day for 7 days   Followed by 5 mg (one 5 mg tablet) by mouth twice a day   ascorbic acid, vitamin C, (VITAMIN C) 250 mg tablet   No No   Sig: Take 1 Tab by mouth  two (2) times a day. Take with iron pill   calcium carbonate (CALTREX) 600 mg calcium (1,500 mg) tablet   Yes No   Sig: Take 600 mg by mouth daily.   cyclobenzaprine  (FLEXERIL) 10 mg tablet   Yes No   Sig: Take 10 mg by mouth three (3) times daily as needed for Muscle Spasm(s).   lamoTRIgine (LAMICTAL) 100 mg tablet   No No   Sig: Take 1 Tab by mouth two (2) times a day.   omeprazole (PRILOSEC) 20 mg capsule   Yes No   Sig: Take 20 mg by mouth daily.   ondansetron hcl (ZOFRAN) 4 mg tablet   No No   Sig: Take 1 Tab by mouth every eight (8) hours as needed for Nausea.   oxyCODONE IR (OXY-IR) 15 mg immediate release tablet   No No   Sig: Take 1 Tab by mouth four (4) times daily. Max Daily Amount: 60 mg.   simvastatin (ZOCOR) 80 mg tablet   Yes No   Sig: Take 80 mg by mouth nightly.   sucralfate (CARAFATE) 100 mg/mL suspension   Yes No   Sig: Take 1 tsp by mouth two (2) times a day.      Facility-Administered Medications: None       Allergies     Allergies   Allergen Reactions   ??? Tape [Adhesive] Itching     Paper tape      ??? Aspirin Not Reported This Time     Because of hx ulcers per patient   ??? Opana [Oxymorphone] Rash and Itching   ??? Tylenol [Acetaminophen] Itching       Physical Exam     ED Triage Vitals [02/06/19 1758]   Enc Vitals Group      BP 124/76      Pulse (Heart Rate) 77      Resp Rate 15      Temp 98.5 ??F (36.9 ??C)      Temp src       O2 Sat (%) 100 %      Weight 150 lb      Height 5\' 7"       Head Circumference       Peak Flow       Pain Score       Pain Loc       Pain Edu?       Excl. in GC?      Constitutional: Patient appears well developed and well nourished. Marland Kitchen. Appearance and behavior are age and situation appropriate.  HEENT: Conjunctiva clear.  PERRLA. Mucous membranes moist, non-erythematous. Surface of the pharynx, palate, and tongue are pink, moist and without lesions. Tympanic membranes clear, skull atraumatic  Neck: supple, non tender, symmetrical, no masses or JVD.   Respiratory: lungs clear to auscultation, nonlabored respirations. No tachypnea or accessory muscle use.  Cardiovascular: heart regular rate and rhythm without murmur rubs or gallops.   Calves  soft and non-tender. Distal pulses 2+ and equal bilaterally.  No peripheral edema .    Gastrointestinal:  Abdomen soft, nontender without complaint of pain to palpation.  No guarding or rebound.  Normal active bowel sounds.  Musculoskeletal: Nail beds pink with prompt capillary refill.  Full range of motion of all joints, no bony tenderness in arms, legs and spine.   Integumentary: warm and dry without rashes or lesions  Neurologic: Sensation intact, motor strength equal and symmetric.  No facial asymmetry or dysarthria.  Psychiatric: Alert,  oriented x3, appropriate mood and affect            Impression and Management Plan   *Arrives here with 2 episode of chest pain with history of coronary artery disease with stenting in the past.*Will be evaluate for myocardial ischemia or infarction.  Will be treated symptomatically if symptoms return.*    Diagnostic Studies   Lab:   Labs Reviewed   CBC WITH AUTOMATED DIFF - Abnormal; Notable for the following components:       Result Value    HGB 9.3 (*)     HCT 32.3 (*)     MCV 75.8 (*)     MCH 21.8 (*)     MCHC 28.8 (*)     MPV 11.6 (*)     RDW-SD 52.2 (*)     LYMPHOCYTES 26.2 (*)     All other components within normal limits   METABOLIC PANEL, COMPREHENSIVE - Abnormal; Notable for the following components:    Chloride 108 (*)     Glucose 72 (*)     Anion gap 4 (*)     All other components within normal limits   LACTIC ACID   TROPONIN I   NT-PRO BNP   T4, FREE   TSH 3RD GENERATION   TROPONIN I       Imaging:    Xr Chest Sngl V    Result Date: 02/06/2019  EXAM:  Chest AP INDICATIONS: chest pain COMPARISON: 03/03/2018. FINDINGS: No consolidation, pleural effusion, or pulmonary edema. No pneumothorax. Normal cardiomediastinal contours.     IMPRESSION: No acute cardiopulmonary process.       EKG: **Sinus rhythm rate of 85 no acute ischemic changes seen per Dr. Arvella MerlesKisa*    ED Course   **Patient remained comfortable during her stay here.  Monitor in sinus rhythm.  Chest pain improved.   Nitrates given.  She was not given any aspirin she is already on Eliquis.  Mated to Dr. Lattie CornsMorse overnight*    ER Medications Given:  Medications   sodium chloride (NS) flush 5-10 mL (has no administration in time range)   nitroglycerin (NITROBID) 2 % ointment 1 Inch (has no administration in time range)   sodium chloride 0.9 % bolus infusion 500 mL (0 mL IntraVENous IV Completed 02/06/19 2102)   oxyCODONE IR (ROXICODONE) tablet 15 mg (15 mg Oral Given 02/06/19 2134)       Medical Decision Making   **Concerning episode of chest pain which seem to be anginal in nature.  Light of her coronary history with stenting she is felt to be high risk to go home and placed on the hospitalist overnight for cardiac evaluation*  Final Diagnosis       ICD-10-CM ICD-9-CM   1. Coronary artery disease involving native coronary artery of native heart with unstable angina pectoris (HCC) I25.110 414.01     411.1   2. Precordial chest pain R07.2 786.51   3. Near syncope R55 780.2   4. History of pulmonary embolism Z86.711 V12.55       Disposition   *tele**    Smitty CordsErik H Almarosa Bohac, MD  February 06, 2019    My signature above authenticates this document and my orders, the final ??  diagnosis (es), discharge prescription (s), and instructions in the Epic ??  record.  If you have any questions please contact [phone].  ??  Nursing notes have been reviewed by the physician/ advanced practice ??  Clinician.  Dragon medical dictation software was used  for portions of this report. Unintended voice recognition errors may occur.

## 2019-02-06 NOTE — ED Notes (Signed)
Pt given two blankets

## 2019-02-06 NOTE — H&P (Signed)
Chart reviewed.  Patient seen and examined on 02/06/2019.  Complete H&P to follow.

## 2019-02-06 NOTE — ED Notes (Signed)
Bedside and Verbal shift change report given to Victoria (oncoming nurse) by Eran (offgoing nurse). Report included the following information SBAR, ED Summary and Procedure Summary.

## 2019-02-06 NOTE — ED Notes (Signed)
Pt brought in by EMS from [place].  Reports shopping and feeling tired, weak, and having chest pain.  When EMS arrived pt was sitting in daughter's car in parking lot.  Pt still reporting tiredness, but chest pain and weakness had resolved.  Reports having similar episodes in the past when exerting herself.  Glucose 226, BP 1446/76, RR 16, HR 92

## 2019-02-06 NOTE — ED Provider Notes (Signed)
[facility]  Emergency Department Treatment Report    Patient: [name] CourseVeronica E Manlove Age: 68 y.o. Sex: female    Date of Birth: 01/04/1951 Admit Date: 02/06/2019 PCP: [name], [name], MD   MRN: [id number]  CSN: [id number]     Room: ER11/ER11 Time Dictated: 10:50 PM      **I hereby certify this patient for admission based upon medical necessity as ??  noted below:    Chief Complaint   **Chest pain shortness of breath and weak all over*  History of Present Illness   68 y.o. female who had an episode Friday while she was cleaning when she felt some heaviness in her chest and became short of breath.  Lasted half an hour and got better with rest.  Sensation was a pressure sensation in her left chest which she had not had before.  She is felt still off yesterday and stating that all day and finally felt better this morning and [place] [employer].  While she was there she developed this chest pain returning and she is ready to check out.  Is again on left side with shortness of breath and she felt weak all over.  She thought she was going to pass out at the counter.  She got so ill appearing that the paramedics were called to the scene.  She said she is never had heart problems that she is aware of like this.  Vital signs were stable upon EMS transport.  Pain is started to ease by time she arrived here.    Review of Systems   *Constitutional: No fever, chills, or weight loss  Eyes: No visual symptoms.  ENT: No sore throat, runny nose or ear pain.  Respiratory: No cough, dyspnea or wheezing.  Cardiovascular: *No palpitations**  Gastrointestinal: No vomiting, diarrhea or abdominal pain.  Genitourinary: No dysuria, frequency, or urgency.  Musculoskeletal: No specific muscle or joint pain or swelling.  Integumentary: No rashes.  Neurological: No headaches, sensory or motor symptoms.  Denies complaints in all other systems.  **    Past Medical/Surgical History     Past Medical History:    Diagnosis Date   ??? Acute hypercapnic respiratory failure (HCC) 02/05/2013    W/ Encephalopathy requiring BiPAP, Etiology Uncertain however Possible Narcotic Benzodiazipine Related?    ??? Adjustment disorder with mixed anxiety and depressed mood    ??? Aneurysm of internal carotid artery    ??? Arthritis of knee    ??? Asthma    ??? Autoimmune disease (HCC)    ??? CAD (coronary artery disease)    ??? Cervicalgia    ??? Chest pain 03/17/2014    Dobutamine Stress Echo: NORMAL WALL MOTION AND GLOBAL SYSTOLIC FUNCTION AT REST AND AFTER?? STRESS/EXERCISE WITH APPROPRIATE AUGMENTATION OF FUNCTION IN ALL SEGMENTS. NO EVIDENCE OF INDUCIBLE ISCHEMIA AT ADEQUATE HEART RATE WITH DOBUTAMINE STRESS.??            ??? Chronic low back pain 10/22/2014    [facility] Lumbar w/o contrast: Moderate facet arthropathy at L4-L5 and L5-S1. Mild/moderate foraminal stenosis at these levels. Disc bulge with annular tear at L4-L5.    ??? Chronic pain syndrome    ??? Coccydynia    ??? Diabetes mellitus     NIDDM   ??? DVT (deep venous thrombosis) (HCC) 05/19/2007    [facility] CTA: 1. Small nonocclusive pulmonary embolus in a subsegmental branch to the left lower lobe. Larger filling defect in a segmental branch to the right  lower lobe, not as low in attenuation as is normally seen with a pulmonary embolus, however is seen in multiple planes and remains concerning for an additional nonocclusive pulmonary embolus.   ??? Fibromyalgia    ??? Gastric ulcer    ??? Gastritis 01/03/2015    [facility] Admission, EGD + H. Pylori/Gastritis   ??? Generalized osteoarthritis of multiple sites    ??? GI bleed 01/01/2015    [facility] Admit 5/2-01/07/2015   ??? H. pylori infection 01/03/2015    [facility] Admission, EGD + H. Pylori/Gastritis   ??? Hemiparesis, right (HCC)    ??? Hyperlipidemia    ??? Hypertension    ??? Irregular sleep-wake rhythm    ??? Knee joint pain    ??? Lumbar spondylosis     Orthopedic Spine Surgeon: Dr. Ree KidaJack L. Henrene HawkingSiegel    ??? Muscle spasm    ??? Normocytic anemia     Chronic Iron Deficiency Anemia   ??? Osteoporosis     ??? Pancreatitis    ??? Peripheral neuropathy    ??? Pulmonary embolism (HCC)     Multiple   ??? Seizures (HCC)    ??? Sensory ataxia    ??? SLE (systemic lupus erythematosus) (HCC)    ??? Stroke [facility](HCC)    ??? Subclinical hypothyroidism    ??? Vertigo    ??? Vitamin D deficiency      Past Surgical History:   Procedure Laterality Date   ??? CARDIAC SURG PROCEDURE UNLIST      Cardiac Cath PCI w/ Stents   ??? HX CHOLECYSTECTOMY     ??? HX GI      Partial Gastrectomy    ??? HX GI  01/03/2015    [facility] EGD by Dr. Smitty CordsBruce D. Waldholtz: S/P B-2 Gastric Resection, Gastritis in small remnant, Bx of Jejunum r/o Celiac Disease and of Stomach. + Gastritis + H. Pylori   ??? HX HYSTERECTOMY       **  Social History     Social History     Socioeconomic History   ??? Marital status: DIVORCED     Spouse name: Not on file   ??? Number of children: Not on file   ??? Years of education: Not on file   ??? Highest education level: Not on file   Tobacco Use   ??? Smoking status: Former Smoker   ??? Smokeless tobacco: Never Used   Substance and Sexual Activity   ??? Alcohol use: No   ??? Drug use: No   ??? Sexual activity: Not Currently     Partners: Male       Family History     Family History   Problem Relation Age of Onset   ??? Diabetes Maternal Grandmother    ??? Heart Disease Maternal Grandmother    ??? Heart Disease Mother        Current Medications     Prior to Admission Medications   Prescriptions Last Dose Informant Patient Reported? Taking?   Cholecalciferol, Vitamin D3, (VITAMIN D) 1,000 unit Cap   Yes No   Sig: Take 50,000 Units by mouth every Sunday.   apixaban (ELIQUIS) 5 mg (74 tabs) starter pack   No No   Sig: Take 10 mg (two 5 mg tablets) by mouth twice a day for 7 days   Followed by 5 mg (one 5 mg tablet) by mouth twice a day   ascorbic acid, vitamin C, (VITAMIN C) 250 mg tablet   No No   Sig: Take 1 Tab by mouth  two (2) times a day. Take with iron pill   calcium carbonate (CALTREX) 600 mg calcium (1,500 mg) tablet   Yes No   Sig: Take 600 mg by mouth daily.    cyclobenzaprine (FLEXERIL) 10 mg tablet   Yes No   Sig: Take 10 mg by mouth three (3) times daily as needed for Muscle Spasm(s).   lamoTRIgine (LAMICTAL) 100 mg tablet   No No   Sig: Take 1 Tab by mouth two (2) times a day.   omeprazole (PRILOSEC) 20 mg capsule   Yes No   Sig: Take 20 mg by mouth daily.   ondansetron hcl (ZOFRAN) 4 mg tablet   No No   Sig: Take 1 Tab by mouth every eight (8) hours as needed for Nausea.   oxyCODONE IR (OXY-IR) 15 mg immediate release tablet   No No   Sig: Take 1 Tab by mouth four (4) times daily. Max Daily Amount: 60 mg.   simvastatin (ZOCOR) 80 mg tablet   Yes No   Sig: Take 80 mg by mouth nightly.   sucralfate (CARAFATE) 100 mg/mL suspension   Yes No   Sig: Take 1 tsp by mouth two (2) times a day.      Facility-Administered Medications: None       Allergies     Allergies   Allergen Reactions   ??? Tape [Adhesive] Itching     Paper tape      ??? Aspirin Not Reported This Time     Because of hx ulcers per patient   ??? Opana [Oxymorphone] Rash and Itching   ??? Tylenol [Acetaminophen] Itching       Physical Exam     ED Triage Vitals [02/06/19 1758]   Enc Vitals Group      BP 124/76      Pulse (Heart Rate) 77      Resp Rate 15      Temp 98.5 ??F (36.9 ??C)      Temp src       O2 Sat (%) 100 %      Weight 150 lb      Height 5\' 7"       Head Circumference       Peak Flow       Pain Score       Pain Loc       Pain Edu?       Excl. in [place]?      Constitutional: Patient appears well developed and well nourished. Marland Kitchen Appearance and behavior are age and situation appropriate.  HEENT: Conjunctiva clear.  PERRLA. Mucous membranes moist, non-erythematous. Surface of the pharynx, palate, and tongue are pink, moist and without lesions. Tympanic membranes clear, skull atraumatic  Neck: supple, non tender, symmetrical, no masses or JVD.   Respiratory: lungs clear to auscultation, nonlabored respirations. No tachypnea or accessory muscle use.  Cardiovascular: heart regular rate and rhythm without murmur rubs or  gallops.   Calves soft and non-tender. Distal pulses 2+ and equal bilaterally.  No peripheral edema .    Gastrointestinal:  Abdomen soft, nontender without complaint of pain to palpation.  No guarding or rebound.  Normal active bowel sounds.  Musculoskeletal: Nail beds pink with prompt capillary refill.  Full range of motion of all joints, no bony tenderness in arms, legs and spine.   Integumentary: warm and dry without rashes or lesions  Neurologic: Sensation intact, motor strength equal and symmetric.  No facial asymmetry or dysarthria.  Psychiatric: Alert,  oriented x3, appropriate mood and affect            Impression and Management Plan   *Arrives here with 2 episode of chest pain with history of coronary artery disease with stenting in the past.*Will be evaluate for myocardial ischemia or infarction.  Will be treated symptomatically if symptoms return.*    Diagnostic Studies   Lab:   Labs Reviewed   CBC WITH AUTOMATED DIFF - Abnormal; Notable for the following components:       Result Value    HGB 9.3 (*)     HCT 32.3 (*)     MCV 75.8 (*)     MCH 21.8 (*)     MCHC 28.8 (*)     MPV 11.6 (*)     RDW-SD 52.2 (*)     LYMPHOCYTES 26.2 (*)     All other components within normal limits   METABOLIC PANEL, COMPREHENSIVE - Abnormal; Notable for the following components:    Chloride 108 (*)     Glucose 72 (*)     Anion gap 4 (*)     All other components within normal limits   LACTIC ACID   TROPONIN I   NT-PRO BNP   T4, FREE   TSH 3RD GENERATION   TROPONIN I       Imaging:    Xr Chest Sngl V    Result Date: 02/06/2019  EXAM:  Chest AP INDICATIONS: chest pain COMPARISON: 03/03/2018. FINDINGS: No consolidation, pleural effusion, or pulmonary edema. No pneumothorax. Normal cardiomediastinal contours.     IMPRESSION: No acute cardiopulmonary process.       EKG: **Sinus rhythm rate of 85 no acute ischemic changes seen per Dr. Arvella MerlesKisa*    ED [name]   **Patient remained comfortable during her stay here.  Monitor in sinus  rhythm.  Chest pain improved.  Nitrates given.  She was not given any aspirin she is already on Eliquis.  Mated to Dr. Lattie CornsMorse overnight*    ER Medications Given:  Medications   sodium chloride (NS) flush 5-10 mL (has no administration in time range)   nitroglycerin (NITROBID) 2 % ointment 1 Inch (has no administration in time range)   sodium chloride 0.9 % bolus infusion 500 mL (0 mL IntraVENous IV Completed 02/06/19 2102)   oxyCODONE IR (ROXICODONE) tablet 15 mg (15 mg Oral Given 02/06/19 2134)       Medical Decision Making   **Concerning episode of chest pain which seem to be anginal in nature.  Light of her coronary history with stenting she is felt to be high risk to go home and placed on the hospitalist overnight for cardiac evaluation*  Final Diagnosis       ICD-10-CM ICD-9-CM   1. Coronary artery disease involving native coronary artery of native heart with unstable angina pectoris (HCC) I25.110 414.01     411.1   2. Precordial chest pain R07.2 786.51   3. Near syncope R55 780.2   4. History of pulmonary embolism Z86.711 V12.55       Disposition   *tele**    Smitty CordsErik H Jerryl Holzhauer, MD  February 06, 2019    My signature above authenticates this document and my orders, the final ??  diagnosis (es), discharge prescription (s), and instructions in the Epic ??  record.  If you have any questions please contact [phone].  ??  Nursing notes have been reviewed by the physician/ advanced practice ??  Clinician.  Dragon medical dictation software was used  for portions of this report. Unintended voice recognition errors may occur.

## 2019-02-06 NOTE — ED Notes (Signed)
TRANSFER - OUT REPORT:    Verbal report given to Denise, RN (name) on Hazelynn E Dobos  being transferred to [address] (unit) for routine progression of care       Report consisted of patient???s Situation, Background, Assessment and   Recommendations(SBAR).     Information from the following report(s) SBAR was reviewed with the receiving nurse.    Lines:   Peripheral IV 02/06/19 Right Wrist (Active)        Opportunity for questions and clarification was provided.      Patient transported with:   Registered Nurse

## 2019-02-06 NOTE — H&P (Signed)
Chart reviewed.  Patient seen and examined on 02/06/2019.  Complete H&P to follow.

## 2019-02-06 NOTE — ED Triage Notes (Signed)
Pt brought in by EMS from [employer].  Reports shopping and feeling tired, weak, and having chest pain.  When EMS arrived pt was sitting in daughter's car in parking lot.  Pt still reporting tiredness, but chest pain and weakness had resolved.  Reports having similar episodes in the past when exerting herself.  Glucose 226, BP 1446/76, RR 16, HR 92

## 2019-02-06 NOTE — H&P (Signed)
History and Physical    DOS: February 06, 2019    Patient: [name] [name]               Sex: female          DOA: 02/06/2019       Date of Birth:  03/17/1951      Age:  68 y.o.        LOS:  LOS: 0 days       MRN: [id number]       Impression/Plan       68 y.o. female with hx of HTN, HLD, CAD, CVA, Asthma, and DM with the following:        1.  Unstable Angina, suspected    2.  Precordial Chest Pain    3.  Near Syncope   4.  CAD s/p Stent   5.  HTN, poor control    6.  HLD   7.  Hx DVT/ PE on Eliquis    8.  Anemia   9.  DM II   10. Hx CVA  11. Asthma/COPD   12. Seizure Disorder     PLAN:     Admit to OBS, tele  O2 as needed.  GI and DVT prophylaxis.  Replace electrolytes as needed.    Serial cardiac enzymes.  Check 2D echo.  Monitor blood pressure.  Add PRN antihypertensive medications if blood pressure is not adequately controlled on home medications.  Monitor blood glucose.  Glucomander monitoring per protocol.  Continue Eliquis for anticoagulation.  Gentle hydration with IV fluids and monitor to avoid fluid overload.  Continue home medications when possible.  Further recommendations based on test results, response to treatment, and clinical course.      Chief Complaint:     Chief Complaint   Patient presents with   ??? Chest Pain   ??? Fatigue       HPI:     [name] [name] is a 68 y.o. female with hx of HTN, HLD, CAD, CVA, Asthma, and DM who presents to the ED via EMS transport with complaints of having intermittent chest pain and heaviness with associated shortness of breath.  Patient had an initial episode which occurred on 02/04/2019.  Her symptoms lasted about 30 minutes and improved with rest.  Patient describes her chest pain is a pressure sensation.  The patient experienced another episode while shopping at [facility] when the chest pain returned while she was checking out from shopping.  She again felt left-sided chest pain with associated shortness of breath and generalized weakness.  She  felt as if she was about to pass out, so she sat down briefly.  She experienced cramping in her legs as well.  EMS was called for transport.  She has had intermittent chills but denies fever.  No nausea, vomiting, abdominal pain, or diarrhea.  Patient states the pain was improving by time she arrived in the ED.      Past Medical History:   Diagnosis Date   ??? Acute hypercapnic respiratory failure ([employer]) 02/05/2013    W/ Encephalopathy requiring BiPAP, Etiology Uncertain however Possible Narcotic Benzodiazipine Related?    ??? Adjustment disorder with mixed anxiety and depressed mood    ??? Aneurysm of internal carotid artery    ??? Arthritis of knee    ??? Asthma    ??? Autoimmune disease ([place])    ??? CAD (coronary artery disease)    ??? Cervicalgia    ??? Chest pain 03/17/2014  Dobutamine Stress Echo: NORMAL WALL MOTION AND GLOBAL SYSTOLIC FUNCTION AT REST AND AFTER?? STRESS/EXERCISE WITH APPROPRIATE AUGMENTATION OF FUNCTION IN ALL SEGMENTS. NO EVIDENCE OF INDUCIBLE ISCHEMIA AT ADEQUATE HEART RATE WITH DOBUTAMINE STRESS.??            ??? Chronic low back pain 10/22/2014    [facility] MRI Lumbar w/o contrast: Moderate facet arthropathy at L4-L5 and L5-S1. Mild/moderate foraminal stenosis at these levels. Disc bulge with annular tear at L4-L5.    ??? Chronic pain syndrome    ??? Coccydynia    ??? Diabetes mellitus     NIDDM   ??? DVT (deep venous thrombosis) ([place]) 05/19/2007    [place] CTA: 1. Small nonocclusive pulmonary embolus in a subsegmental branch to the left lower lobe. Larger filling defect in a segmental branch to the right lower lobe, not as low in attenuation as is normally seen with a pulmonary embolus, however is seen in multiple planes and remains concerning for an additional nonocclusive pulmonary embolus.   ??? Fibromyalgia    ??? Gastric ulcer    ??? Gastritis 01/03/2015    [place] Admission, EGD + H. Pylori/Gastritis   ??? Generalized osteoarthritis of multiple sites    ??? GI bleed 01/01/2015    [place] Admit 5/2-01/07/2015    ??? H. pylori infection 01/03/2015    [place] Admission, EGD + H. Pylori/Gastritis   ??? Hemiparesis, right ([place])    ??? Hyperlipidemia    ??? Hypertension    ??? Irregular sleep-wake rhythm    ??? Knee joint pain    ??? Lumbar spondylosis     Orthopedic Spine Surgeon: Dr. Barnabas Lister L. Sonny Masters    ??? Muscle spasm    ??? Normocytic anemia     Chronic Iron Deficiency Anemia   ??? Osteoporosis    ??? Pancreatitis    ??? Peripheral neuropathy    ??? Pulmonary embolism (HCC)     Multiple   ??? Seizures ([place])    ??? Sensory ataxia    ??? SLE (systemic lupus erythematosus) ([place])    ??? Stroke [facility])    ??? Subclinical hypothyroidism    ??? Vertigo    ??? Vitamin D deficiency        Past Surgical History:   Procedure Laterality Date   ??? CARDIAC SURG PROCEDURE UNLIST      Cardiac Cath PCI w/ Stents   ??? HX CHOLECYSTECTOMY     ??? HX GI      Partial Gastrectomy    ??? HX GI  01/03/2015    [facility] EGD by Dr. Darnell Level D. Waldholtz: S/P B-2 Gastric Resection, Gastritis in small remnant, Bx of Jejunum r/o Celiac Disease and of Stomach. + Gastritis + H. Pylori   ??? HX HYSTERECTOMY         Family History   Problem Relation Age of Onset   ??? Diabetes Maternal Grandmother    ??? Heart Disease Maternal Grandmother    ??? Heart Disease Mother        Social History     Socioeconomic History   ??? Marital status: DIVORCED     Spouse name: Not on file   ??? Number of children: Not on file   ??? Years of education: Not on file   ??? Highest education level: Not on file   Tobacco Use   ??? Smoking status: Former Smoker   ??? Smokeless tobacco: Never Used   Substance and Sexual Activity   ??? Alcohol use: No   ??? Drug use: No   ???  Sexual activity: Not Currently     Partners: Male       Prior to Admission medications    Medication Sig Start Date End Date Taking? Authorizing Provider   calcium carbonate (CALTREX) 600 mg calcium (1,500 mg) tablet Take 600 mg by mouth daily.    Other, Phys, MD   apixaban (ELIQUIS) 5 mg (74 tabs) starter pack Take 10 mg (two 5 mg tablets) by mouth twice a day for 7 days    Followed by 5 mg (one 5 mg tablet) by mouth twice a day 11/16/17   [place] House, MD   omeprazole (PRILOSEC) 20 mg capsule Take 20 mg by mouth daily.    Provider, Historical   ondansetron hcl (ZOFRAN) 4 mg tablet Take 1 Tab by mouth every eight (8) hours as needed for Nausea. 02/20/17   Holladay, Lorra Hals, MD   cyclobenzaprine (FLEXERIL) 10 mg tablet Take 10 mg by mouth three (3) times daily as needed for Muscle Spasm(s).    Provider, Historical   sucralfate (CARAFATE) 100 mg/mL suspension Take 1 tsp by mouth two (2) times a day.    Provider, Historical   ascorbic acid, vitamin C, (VITAMIN C) 250 mg tablet Take 1 Tab by mouth two (2) times a day. Take with iron pill 12/29/15   Elon Spanner, MD   oxyCODONE IR (OXY-IR) 15 mg immediate release tablet Take 1 Tab by mouth four (4) times daily. Max Daily Amount: 60 mg. 07/20/15   Barkley Boards, DO   simvastatin (ZOCOR) 80 mg tablet Take 80 mg by mouth nightly.    Other, Phys, MD   lamoTRIgine (LAMICTAL) 100 mg tablet Take 1 Tab by mouth two (2) times a day. 01/07/15   Stanton Kidney, MD   Cholecalciferol, Vitamin D3, (VITAMIN D) 1,000 unit Cap Take 50,000 Units by mouth every Sunday.    Provider, Historical       Allergies   Allergen Reactions   ??? Tape [Adhesive] Itching     Paper tape      ??? Aspirin Not Reported This Time     Because of hx ulcers per patient   ??? Opana [Oxymorphone] Rash and Itching   ??? Tylenol [Acetaminophen] Itching       Review of Systems:    1) See above. 2) A 12 point review of systems has been obtained. ROS is otherwise noncontributory.      Physical Exam:      Visit Vitals  BP 120/77   Pulse 76   Temp 98.5 ??F (36.9 ??C)   Resp 16   Ht '5\' 7"'$  (1.702 m)   Wt 68 kg (150 lb)   SpO2 97%   BMI 23.49 kg/m??         Intake/Output Summary (Last 24 hours) at 02/06/2019 2130  Last data filed at 02/06/2019 2102  Gross per 24 hour   Intake 1000 ml   Output ???   Net 1000 ml       Physical Exam:    HEENT: NC/AT, PERRLA, EOMI, dry mucous membranes   Neck: No JVD no bruits, supple, nontender, no significant LAD  LYMPH: No supraclavicular or cervical or axillary nodes on both sides  Cardiovascular: Heart, RRR, no M, R, G  Lungs: Clear to auscultation, coarse upper airway sounds, no wheezes or crackles  Abd: Soft, non tender, not distended, No guarding, No rigidity, BS normal  NEURO:  Non focal, normal strength. CN II - XII intact bilaterally   Extrm: no leg  edema   Skin: No rashes or lesions     Labs Reviewed:     Recent Results (from the past 24 hour(s))   CBC WITH AUTOMATED DIFF    Collection Time: 02/06/19  7:05 PM   Result Value Ref Range    WBC 6.8 4.0 - 11.0 1000/mm3    RBC 4.26 3.60 - 5.20 M/uL    HGB 9.3 (L) 13.0 - 17.2 gm/dl    HCT 32.3 (L) 37.0 - 50.0 %    MCV 75.8 (L) 80.0 - 98.0 fL    MCH 21.8 (L) 25.4 - 34.6 pg    MCHC 28.8 (L) 30.0 - 36.0 gm/dl    PLATELET 321 140 - 450 1000/mm3    MPV 11.6 (H) 6.0 - 10.0 fL    RDW-SD 52.2 (H) 36.4 - 46.3      NRBC 0 0 - 0      IMMATURE GRANULOCYTES 0.1 0.0 - 3.0 %    NEUTROPHILS 62.2 34 - 64 %    LYMPHOCYTES 26.2 (L) 28 - 48 %    MONOCYTES 7.1 1 - 13 %    EOSINOPHILS 3.2 0 - 5 %    BASOPHILS 1.2 0 - 3 %   METABOLIC PANEL, COMPREHENSIVE    Collection Time: 02/06/19  7:05 PM   Result Value Ref Range    Sodium 138 136 - 145 mEq/L    Potassium 4.6 3.5 - 5.1 mEq/L    Chloride 108 (H) 98 - 107 mEq/L    CO2 26 21 - 32 mEq/L    Glucose 72 (L) 74 - 106 mg/dl    BUN 8 7 - 25 mg/dl    Creatinine 0.9 0.6 - 1.3 mg/dl    GFR est AA >60.0      GFR est non-AA >60      Calcium 8.9 8.5 - 10.1 mg/dl    AST (SGOT) 35 15 - 37 U/L    ALT (SGPT) 27 12 - 78 U/L    Alk. phosphatase 100 45 - 117 U/L    Bilirubin, total 0.3 0.2 - 1.0 mg/dl    Protein, total 8.2 6.4 - 8.2 gm/dl    Albumin 3.7 3.4 - 5.0 gm/dl    Anion gap 4 (L) 5 - 15 mmol/L   LACTIC ACID    Collection Time: 02/06/19  7:05 PM   Result Value Ref Range    Lactic Acid 1.6 0.4 - 2.0 mmol/L   TROPONIN I    Collection Time: 02/06/19  7:05 PM   Result Value Ref Range     Troponin-I <0.015 0.000 - 0.045 ng/ml   NT-PRO BNP    Collection Time: 02/06/19  7:05 PM   Result Value Ref Range    NT pro-BNP 42.0 0.0 - 125.0 pg/ml   T4, FREE    Collection Time: 02/06/19  7:05 PM   Result Value Ref Range    Free T4 1.06 0.76 - 1.46 ng/dl   TSH 3RD GENERATION    Collection Time: 02/06/19  7:05 PM   Result Value Ref Range    TSH 2.230 0.358 - 3.740 uIU/mL           Diagnostic:     Xr Chest Sngl V    Result Date: 02/06/2019  EXAM:  Chest AP INDICATIONS: chest pain COMPARISON: 03/03/2018. FINDINGS: No consolidation, pleural effusion, or pulmonary edema. No pneumothorax. Normal cardiomediastinal contours.     IMPRESSION: No acute cardiopulmonary process.     Dragon medical  dictation software was used for portions of this report.  Efforts have been made to edit the dictations, but occasionally words are mis-transcribed.    Knox Saliva, MD  02/06/2019  9:30 PM

## 2019-02-07 ENCOUNTER — Observation Stay: Admit: 2019-02-07 | Payer: PRIVATE HEALTH INSURANCE | Primary: Internal Medicine

## 2019-02-07 LAB — EKG, 12 LEAD, INITIAL
Atrial Rate: 81 {beats}/min
Calculated P Axis: 70 degrees
Calculated R Axis: 28 degrees
Calculated T Axis: 51 degrees
Diagnosis: NORMAL
P-R Interval: 140 ms
Q-T Interval: 366 ms
QRS Duration: 90 ms
QTC Calculation (Bezet): 425 ms
Ventricular Rate: 81 {beats}/min

## 2019-02-07 LAB — CBC WITH AUTOMATED DIFF
BASOPHILS: 0.7 % (ref 0–3)
EOSINOPHILS: 3.5 % (ref 0–5)
HCT: 28.1 % — ABNORMAL LOW (ref 37.0–50.0)
HGB: 8.1 gm/dl — ABNORMAL LOW (ref 13.0–17.2)
IMMATURE GRANULOCYTES: 0.2 % (ref 0.0–3.0)
LYMPHOCYTES: 29.9 % (ref 28–48)
MCH: 21.7 pg — ABNORMAL LOW (ref 25.4–34.6)
MCHC: 28.8 gm/dl — ABNORMAL LOW (ref 30.0–36.0)
MCV: 75.3 fL — ABNORMAL LOW (ref 80.0–98.0)
MONOCYTES: 7.5 % (ref 1–13)
MPV: 10.6 fL — ABNORMAL HIGH (ref 6.0–10.0)
NEUTROPHILS: 58.2 % (ref 34–64)
NRBC: 0 (ref 0–0)
PLATELET: 290 10*3/uL (ref 140–450)
RBC: 3.73 M/uL (ref 3.60–5.20)
RDW-SD: 51.5 — ABNORMAL HIGH (ref 36.4–46.3)
WBC: 6 10*3/uL (ref 4.0–11.0)

## 2019-02-07 LAB — METABOLIC PANEL, BASIC
Anion gap: 3 mmol/L — ABNORMAL LOW (ref 5–15)
BUN: 6 mg/dl — ABNORMAL LOW (ref 7–25)
CO2: 28 mEq/L (ref 21–32)
Calcium: 8.4 mg/dl — ABNORMAL LOW (ref 8.5–10.1)
Chloride: 109 mEq/L — ABNORMAL HIGH (ref 98–107)
Creatinine: 0.6 mg/dl (ref 0.6–1.3)
GFR est AA: >60
GFR est non-AA: >60
Glucose: 90 mg/dl (ref 74–106)
Potassium: 3.9 mEq/L (ref 3.5–5.1)
Sodium: 140 mEq/L (ref 136–145)

## 2019-02-07 LAB — GLUCOSE, POC
Glucose (POC): 97 mg/dL (ref 65–105)
Glucose (POC): 114 mg/dL — ABNORMAL HIGH (ref 65–105)
Glucose (POC): 114 mg/dL — ABNORMAL HIGH (ref 65–105)
Glucose (POC): 164 mg/dL — ABNORMAL HIGH (ref 65–105)

## 2019-02-07 LAB — TROPONIN I
Troponin-I: <0.015 ng/ml (ref 0.000–0.045)
Troponin-I: <0.015 ng/ml (ref 0.000–0.045)

## 2019-02-07 LAB — PROTHROMBIN TIME + INR
INR: 1 (ref 0.1–1.1)
Prothrombin time: 11.8 seconds (ref 10.2–12.9)

## 2019-02-07 LAB — MAGNESIUM
Magnesium: 2 mg/dl (ref 1.6–2.6)
Magnesium: 2 mg/dl (ref 1.6–2.6)

## 2019-02-07 LAB — BASIC METABOLIC PANEL
Anion Gap: 3 mmol/L — ABNORMAL LOW (ref 5–15)
BUN: 6 mg/dl — ABNORMAL LOW (ref 7–25)
CO2: 28 mEq/L (ref 21–32)
Calcium: 8.4 mg/dl — ABNORMAL LOW (ref 8.5–10.1)
Chloride: 109 mEq/L — ABNORMAL HIGH (ref 98–107)
Creatinine: 0.6 mg/dl (ref 0.6–1.3)
EGFR IF NonAfrican American: >60
GFR African American: >60
Glucose: 90 mg/dl (ref 74–106)
Potassium: 3.9 mEq/L (ref 3.5–5.1)
Sodium: 140 mEq/L (ref 136–145)

## 2019-02-07 LAB — CBC WITH AUTO DIFFERENTIAL
Basophils %: 0.7 % (ref 0–3)
Eosinophils %: 3.5 % (ref 0–5)
Hematocrit: 28.1 % — ABNORMAL LOW (ref 37.0–50.0)
Hemoglobin: 8.1 gm/dl — ABNORMAL LOW (ref 13.0–17.2)
Immature Granulocytes: 0.2 % (ref 0.0–3.0)
Lymphocytes %: 29.9 % (ref 28–48)
MCH: 21.7 pg — ABNORMAL LOW (ref 25.4–34.6)
MCHC: 28.8 gm/dl — ABNORMAL LOW (ref 30.0–36.0)
MCV: 75.3 fL — ABNORMAL LOW (ref 80.0–98.0)
MPV: 10.6 fL — ABNORMAL HIGH (ref 6.0–10.0)
Monocytes %: 7.5 % (ref 1–13)
Neutrophils %: 58.2 % (ref 34–64)
Nucleated RBCs: 0 (ref 0–0)
Platelets: 290 10*3/uL (ref 140–450)
RBC: 3.73 M/uL (ref 3.60–5.20)
RDW-SD: 51.5 — ABNORMAL HIGH (ref 36.4–46.3)
WBC: 6 10*3/uL (ref 4.0–11.0)

## 2019-02-07 LAB — EKG 12-LEAD
Atrial Rate: 81 {beats}/min
Diagnosis: NORMAL
P Axis: 70 degrees
P-R Interval: 140 ms
Q-T Interval: 366 ms
QRS Duration: 90 ms
QTc Calculation (Bazett): 425 ms
R Axis: 28 degrees
T Axis: 51 degrees
Ventricular Rate: 81 {beats}/min

## 2019-02-07 LAB — POCT GLUCOSE
POC Glucose: 97 mg/dL (ref 65–105)
POC Glucose: 114 mg/dL — ABNORMAL HIGH (ref 65–105)
POC Glucose: 114 mg/dL — ABNORMAL HIGH (ref 65–105)
POC Glucose: 164 mg/dL — ABNORMAL HIGH (ref 65–105)

## 2019-02-07 LAB — PROTIME-INR
INR: 1 (ref 0.1–1.1)
Protime: 11.8 seconds (ref 10.2–12.9)

## 2019-02-07 LAB — TROPONIN
Troponin I: <0.015 ng/ml (ref 0.000–0.045)
Troponin I: <0.015 ng/ml (ref 0.000–0.045)

## 2019-02-07 MED ORDER — CYCLOBENZAPRINE 10 MG TAB
10 mg | Freq: Three times a day (TID) | ORAL | Status: DC | PRN
Start: 2019-02-07 — End: 2019-02-08

## 2019-02-07 MED ORDER — LAMOTRIGINE 100 MG TAB
100 mg | Freq: Two times a day (BID) | ORAL | Status: DC
Start: 2019-02-07 — End: 2019-02-08
  Administered 2019-02-07 – 2019-02-08 (×4): via ORAL

## 2019-02-07 MED ORDER — IOPAMIDOL 61 % IV SOLN
61 % | Freq: Once | INTRAVENOUS | Status: AC
Start: 2019-02-07 — End: 2019-02-07
  Administered 2019-02-08: via INTRAVENOUS

## 2019-02-07 MED ORDER — ASCORBIC ACID 500 MG TAB
500 mg | Freq: Two times a day (BID) | ORAL | Status: DC
Start: 2019-02-07 — End: 2019-02-08
  Administered 2019-02-07 – 2019-02-08 (×3): via ORAL

## 2019-02-07 MED ORDER — OMEPRAZOLE 20 MG CAP, DELAYED RELEASE
20 mg | Freq: Every day | ORAL | Status: DC
Start: 2019-02-07 — End: 2019-02-08
  Administered 2019-02-07 – 2019-02-08 (×2): via ORAL

## 2019-02-07 MED ORDER — NITROGLYCERIN 2 % TRANSDERMAL OINTMENT
2 % | Freq: Three times a day (TID) | TRANSDERMAL | Status: DC
Start: 2019-02-07 — End: 2019-02-07

## 2019-02-07 MED ORDER — SODIUM CHLORIDE 0.9 % IJ SYRG
Freq: Three times a day (TID) | INTRAMUSCULAR | Status: DC
Start: 2019-02-07 — End: 2019-02-08
  Administered 2019-02-07 – 2019-02-08 (×6): via INTRAVENOUS

## 2019-02-07 MED ORDER — SODIUM CHLORIDE 0.9 % IV
INTRAVENOUS | Status: DC
Start: 2019-02-07 — End: 2019-02-08
  Administered 2019-02-07 – 2019-02-08 (×2): via INTRAVENOUS

## 2019-02-07 MED ORDER — APIXABAN 5 MG TABLET
5 mg | Freq: Two times a day (BID) | ORAL | Status: DC
Start: 2019-02-07 — End: 2019-02-08
  Administered 2019-02-07 – 2019-02-08 (×3): via ORAL

## 2019-02-07 MED ORDER — NITROGLYCERIN 2 % TRANSDERMAL OINTMENT
2 % | TRANSDERMAL | Status: AC
Start: 2019-02-07 — End: 2019-02-06
  Administered 2019-02-07: 03:00:00 via TOPICAL

## 2019-02-07 MED ORDER — OXYCODONE 5 MG TAB
5 mg | ORAL | Status: AC
Start: 2019-02-07 — End: 2019-02-06
  Administered 2019-02-07: 02:00:00 via ORAL

## 2019-02-07 MED ORDER — OXYCODONE 15 MG TAB
15 mg | ORAL | Status: DC | PRN
Start: 2019-02-07 — End: 2019-02-08
  Administered 2019-02-07 – 2019-02-08 (×5): via ORAL

## 2019-02-07 MED ORDER — NITROGLYCERIN 2 % TRANSDERMAL OINTMENT
2 % | Freq: Three times a day (TID) | TRANSDERMAL | Status: DC
Start: 2019-02-07 — End: 2019-02-08
  Administered 2019-02-07 – 2019-02-08 (×5): via TOPICAL

## 2019-02-07 MED ORDER — NALOXONE 0.4 MG/ML INJECTION
0.4 mg/mL | INTRAMUSCULAR | Status: DC | PRN
Start: 2019-02-07 — End: 2019-02-08

## 2019-02-07 MED ORDER — SUCRALFATE 100 MG/ML ORAL SUSP
100 mg/mL | Freq: Two times a day (BID) | ORAL | Status: DC
Start: 2019-02-07 — End: 2019-02-08
  Administered 2019-02-07 – 2019-02-08 (×3): via ORAL

## 2019-02-07 MED ORDER — CALCIUM CARBONATE 500 MG (1250 MG) TAB
500 mg calcium (1,250 mg) | Freq: Every day | ORAL | Status: DC
Start: 2019-02-07 — End: 2019-02-08
  Administered 2019-02-07 – 2019-02-08 (×2): via ORAL

## 2019-02-07 MED ORDER — SIMVASTATIN 40 MG TAB
40 mg | Freq: Every evening | ORAL | Status: DC
Start: 2019-02-07 — End: 2019-02-08
  Administered 2019-02-08: 01:00:00 via ORAL

## 2019-02-07 MED ORDER — SODIUM CHLORIDE 0.9 % IJ SYRG
INTRAMUSCULAR | Status: DC | PRN
Start: 2019-02-07 — End: 2019-02-08

## 2019-02-07 MED ORDER — OXYCODONE 5 MG TAB
5 mg | ORAL | Status: AC
Start: 2019-02-07 — End: 2019-02-07
  Administered 2019-02-07: 09:00:00 via ORAL

## 2019-02-07 MED FILL — SUCRALFATE 100 MG/ML ORAL SUSP: 100 mg/mL | ORAL | Qty: 10

## 2019-02-07 MED FILL — OMEPRAZOLE 20 MG CAP, DELAYED RELEASE: 20 mg | ORAL | Qty: 1

## 2019-02-07 MED FILL — VITAMIN C 500 MG TABLET: 500 mg | ORAL | Qty: 1

## 2019-02-07 MED FILL — OXYCODONE 5 MG TAB: 5 mg | ORAL | Qty: 1

## 2019-02-07 MED FILL — NITRO-BID 2 % TRANSDERMAL OINTMENT: 2 % | TRANSDERMAL | Qty: 1

## 2019-02-07 MED FILL — LAMOTRIGINE 100 MG TAB: 100 mg | ORAL | Qty: 1

## 2019-02-07 MED FILL — ELIQUIS 5 MG TABLET: 5 mg | ORAL | Qty: 1

## 2019-02-07 MED FILL — OYSTER SHELL CALCIUM 500  500 MG CALCIUM (1,250 MG) TABLET: 500 mg calcium (1,250 mg) | ORAL | Qty: 1

## 2019-02-07 MED FILL — SODIUM CHLORIDE 0.9 % IV: INTRAVENOUS | Qty: 1000

## 2019-02-07 MED FILL — OXYCODONE 5 MG TAB: 5 mg | ORAL | Qty: 3

## 2019-02-07 MED FILL — NORMAL SALINE FLUSH 0.9 % INJECTION SYRINGE: INTRAMUSCULAR | Qty: 10

## 2019-02-07 MED FILL — ISOVUE-300  61 % INTRAVENOUS SOLUTION: 300 mg iodine /mL (61 %) | INTRAVENOUS | Qty: 80

## 2019-02-07 NOTE — Progress Notes (Signed)
Bedside shift change report given to Marle RN (oncoming nurse) by Kenneth RN (offgoing nurse). Report included the following information SBAR, Kardex, ED Summary, Procedure Summary, Intake/Output, MAR, Recent Results, Med Rec Status and Cardiac Rhythm NSR.

## 2019-02-07 NOTE — Progress Notes (Signed)
PAGER ID: [phone]   MESSAGE: Room 6622 can she have something for a headache, patient has an allergy to Tylenol   Jessica [phone]

## 2019-02-07 NOTE — Progress Notes (Signed)
PAGER ID: [phone]   MESSAGE: Room 6622 can patient have a diet   Jessica [phone]

## 2019-02-07 NOTE — Progress Notes (Signed)
 Patient admitted on 02/06/2019 from home with   Chief Complaint   Patient presents with   . Chest Pain   . Fatigue          The patient is being treated for    PMH:   Past Medical History:   Diagnosis Date   . Acute hypercapnic respiratory failure (HCC) 02/05/2013    W/ Encephalopathy requiring BiPAP, Etiology Uncertain however Possible Narcotic Benzodiazipine Related?    . Adjustment disorder with mixed anxiety and depressed mood    . Aneurysm of internal carotid artery    . Arthritis of knee    . Asthma    . Autoimmune disease (HCC)    . CAD (coronary artery disease)    . Cervicalgia    . Chest pain 03/17/2014    Dobutamine Stress Echo: NORMAL WALL MOTION AND GLOBAL SYSTOLIC FUNCTION AT REST AND AFTER STRESS/EXERCISE WITH APPROPRIATE AUGMENTATION OF FUNCTION IN ALL SEGMENTS. NO EVIDENCE OF INDUCIBLE ISCHEMIA AT ADEQUATE HEART RATE WITH DOBUTAMINE STRESS.            SABRA Chronic low back pain 10/22/2014    [facility] MRI Lumbar w/o contrast: Moderate facet arthropathy at L4-L5 and L5-S1. Mild/moderate foraminal stenosis at these levels. Disc bulge with annular tear at L4-L5.    SABRA Chronic pain syndrome    . Coccydynia    . Diabetes mellitus     NIDDM   . DVT (deep venous thrombosis) (HCC) 05/19/2007    [facility] CTA: 1. Small nonocclusive pulmonary embolus in a subsegmental branch to the left lower lobe. Larger filling defect in a segmental branch to the right lower lobe, not as low in attenuation as is normally seen with a pulmonary embolus, however is seen in multiple planes and remains concerning for an additional nonocclusive pulmonary embolus.   . Fibromyalgia    . Gastric ulcer    . Gastritis 01/03/2015    [facility] Admission, EGD + H. Pylori/Gastritis   . Generalized osteoarthritis of multiple sites    . GI bleed 01/01/2015    [facility] Admit 5/2-01/07/2015   . H. pylori infection 01/03/2015    [facility] Admission, EGD + H. Pylori/Gastritis   . Hemiparesis, right (HCC)    . Hyperlipidemia    . Hypertension    . Irregular sleep-wake rhythm     . Knee joint pain    . Lumbar spondylosis     Orthopedic Spine Surgeon: Dr. Marinell L. Reeta    . Muscle spasm    . Normocytic anemia     Chronic Iron Deficiency Anemia   . Osteoporosis    . Pancreatitis    . Peripheral neuropathy    . Pulmonary embolism (HCC)     Multiple   . Seizures (HCC)    . Sensory ataxia    . SLE (systemic lupus erythematosus) (HCC)    . Stroke (HCC)    . Subclinical hypothyroidism    . Vertigo    . Vitamin D deficiency         Treatment Team: Treatment Team: Attending Provider: Tollie Reyes MATSU, MD; Consulting Provider: Dannielle Curtistine LABOR, MD; Hospitalist: Tollie Reyes MATSU, MD; Care Manager: Jackquelyn Odella SAUNDERS., RN      The patient has been admitted to the hospital 0 times in the past 12 months.        Caregivers Participating in Plan of Care/Discharge Plan with the patient: pt and CM    Tentative dc plan: home with family and resume PCA care in  home/ she denied other needs for home     Anticipated DME needs for discharge:  none    PRESCREENING COMPLETED FOR SNF n/a      Anticipated Discharge Date: 6.09      Barriers to Healthcare Success/ Readmission Risk Factors: stroke and DM see co morbidity list above        RRAT Score: High Risk            24       Total Score        3 Has Seen PCP in Last 6 Months (Yes=3, No=0)    4 Pt. Coverage (Medicare=5 , Medicaid, or Self-Pay=4)    17 Charlson Comorbidity Score (Age + Comorbid Conditions)        Criteria that do not apply:    Married. Living with Significant Other. Assisted Living. LTAC. SNF. or   Rehab    Patient Length of Stay (>5 days = 3)    IP Visits Last 12 Months (1-3=4, 4=9, >4=11)           PCP: Snellings, Norleen BRAVO, MD       DME available at [facility] rollator    Home Environment and Prior Level of Function: Lives at [address]  [place] [address] per pt her granddtr Veronique stays in home with her and pts dtr is her PCA in home daily/ per pt she can feed self but dtr assist with all over care    Prior to admission open services:  yes    Home Health Agency-  Personal Care Agency- per pt dtr Jereld is paid caregiver every day either 04-1399 or 05-1499  Extended Emergency Contact Information  Primary Emergency Contact: Celestia Jereld Quay  Address: [address]           [place], [place] [address] [place]  Home Phone: [phone]  Mobile Phone: [phone]  Relation: Daughter     Transportation: dtr will transport home        Case Management Assessment                          PRIMARY DECISION MAKER    self                               CARE MANAGEMENT INTERVENTIONS   Readmission Interview Completed: Not Applicable   PCP Verified by CM: Yes(visiting MD)           Mode of Transport at Discharge: Other (see comment)(dtr)       Transition of Care Consult (CM Consult): Discharge Planning               Discharge Durable Medical Equipment: Yes(rollator)   Physical Therapy Consult: No   Occupational Therapy Consult: No   Speech Therapy Consult: No   Current Support Network: Own Home, Other, Has Personal Caregivers(per pt granddtr stays with her and pts dtr is her PCA in home)   Reason for Referral: DCP Rounds   History Provided By: Patient, Medical Record   Patient Orientation: Alert and Oriented   Cognition: Alert   Support System Response: Concerned   Previous Living Arrangement: Lives with Family Dependent   Home Accessibility: Steps(2 then porch)   Prior Functional Level: Assistance with the following:, Bathing, Dressing, Housework, Cooking, Mobility, Lobbyist, Shopping   Current Functional Level: Assistance with the following:, Bathing, Dressing, Housework, Cooking, Toileting, Shopping,  Mobility       Can patient return to prior living arrangement: Yes   Ability to make needs known:: Good   Family able to assist with home care needs:: Yes               Types of Needs Identified: Disease Management Education, ADLs/IADLs, Treatment Education, Activity/Exercise   Anticipated Discharge Needs: Home Health Services                       DISCHARGE LOCATION   Discharge Placement: Home with family assistance

## 2019-02-07 NOTE — Progress Notes (Signed)
Medical Progress Note      NAME: [name] [name]   DOB:  [birth year]  MRM:  [id number]    Date/Time: 02/07/2019  11:50 AM         Subjective:     Ms Vipond is a 68year old woman with Hx of arthritis, HTN HLD CAD, hx CVA and asthma and DM. She presented to the ED with CO intermittent CP and SOB that lasted about 58mintues and was relieved with rest while shopping at [place]. EMS was called and she was brought to the ED. So still CO SOB, and feels her "heart skiiping beats". She denies CP presently. She CO headache this AM. She normally takes roxicodone at home for chronic pain management but it has not been reconciled on admit orders.     Past Medical History reviewed and unchanged from Admission History and Physical    Review of Systems   Constitutional: Positive for fatigue.   HENT: Negative.    Eyes: Negative.    Respiratory: Negative.    Cardiovascular: Positive for chest pain. Negative for leg swelling.   Gastrointestinal: Negative.    Endocrine: Negative.    Genitourinary: Negative.    Musculoskeletal: Positive for arthralgias and back pain.   Skin: Negative.    Neurological: Negative.    Hematological: Negative.    Psychiatric/Behavioral: Negative.             Objective:       Vitals:      Last 24hrs VS reviewed since prior progress note. Most recent are:    Visit Vitals  BP 122/70 (BP 1 Location: Right arm, BP Patient Position: Supine)   Pulse 86   Temp 98.1 ??F (36.7 ??C)   Resp 17   Ht 5\' 7"  (1.702 m)   Wt 66.4 kg (146 lb 6.2 oz)   SpO2 100%   BMI 22.93 kg/m??     SpO2 Readings from Last 6 Encounters:   02/07/19 100%   11/09/18 100%   09/08/18 99%   09/06/18 100%   03/09/18 99%   03/03/18 95%            Intake/Output Summary (Last 24 hours) at 02/07/2019 1150  Last data filed at 02/06/2019 2102  Gross per 24 hour   Intake 1000 ml   Output ???   Net 1000 ml          Exam:      Physical Exam  HENT:      Head: Normocephalic.      Mouth/Throat:      Mouth: Mucous membranes are moist.   Eyes:      Extraocular Movements:  Extraocular movements intact.      Conjunctiva/sclera: Conjunctivae normal.      Pupils: Pupils are equal, round, and reactive to light.   Neck:      Musculoskeletal: Normal range of motion.   Cardiovascular:      Rate and Rhythm: Normal rate and regular rhythm.   Pulmonary:      Effort: Pulmonary effort is normal.      Breath sounds: Normal breath sounds.   Abdominal:      General: Bowel sounds are normal.      Palpations: Abdomen is soft.   Musculoskeletal: Normal range of motion.   Skin:     General: Skin is warm.   Neurological:      General: No focal deficit present.      Mental Status: She is alert.  Lab Data Reviewed: (see below)      Medications:  Current Facility-Administered Medications   Medication Dose Route Frequency   ??? apixaban (ELIQUIS) tablet 5 mg  5 mg Oral BID   ??? ascorbic acid (vitamin C) (VITAMIN C) tablet 250 mg  250 mg Oral BID   ??? calcium carbonate (OS-CAL) tablet 500 mg [elemental]  500 mg Oral DAILY   ??? omeprazole (PRILOSEC) capsule 20 mg  20 mg Oral DAILY   ??? simvastatin (ZOCOR) tablet 80 mg  80 mg Oral QHS   ??? sucralfate (CARAFATE) 100 mg/mL oral suspension 0.5 g  0.5 g Oral BID   ??? lamoTRIgine (LaMICtal) tablet 100 mg  100 mg Oral BID   ??? sodium chloride (NS) flush 5-10 mL  5-10 mL IntraVENous Q8H   ??? sodium chloride (NS) flush 5-10 mL  5-10 mL IntraVENous PRN   ??? naloxone (NARCAN) injection 0.1 mg  0.1 mg IntraVENous PRN   ??? nitroglycerin (NITROBID) 2 % ointment 1 Inch  1 Inch Topical TID   ??? 0.9% sodium chloride infusion  100 mL/hr IntraVENous CONTINUOUS   ??? oxyCODONE IR (ROXICODONE) tablet 20 mg  20 mg Oral Q4H PRN   ??? cyclobenzaprine (FLEXERIL) tablet 10 mg  10 mg Oral TID PRN       ______________________________________________________________________      Lab Review:     Recent Labs     02/07/19  0414 02/06/19  1905   WBC 6.0 6.8   HGB 8.1* 9.3*   HCT 28.1* 32.3*   PLT 290 321     Recent Labs     02/07/19  0414 02/06/19  1905   NA 140 138   K 3.9 4.6   CL 109* 108*   CO2 28  26   GLU 90 72*   BUN 6* 8   CREA 0.6 0.9   CA 8.4* 8.9   MG 2.0  --    ALB  --  3.7   ALT  --  27   INR 1.0  --      Recent Labs     02/07/19  0414   INR 1.0            Assessment:     Active Problems:    Precordial chest pain (02/06/2019)           Plan:     Risk of deterioration: medium             1. Chest pain. Evaluation in progress. CTA chest ordered. ECHo ordered. Will order nuclear stress test for the AM pending a negative CTA of the chest  2. PRN pain meds ordered. Roxicodone dose increased and flexeril PRN ordered  3. Near syncope. ECHO and telemetry monitoring in progress  4. CAD SP stent remote past. Continue ASA statin and BP control  5. HTN. Continue home meds. Reconciled and continued. Adjust as needed  6. 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 Continue eliquis. Check CTA chest  7. Anemia. Appears chronic. Monitoring trend  8. DM II continue glucommander protocol  9. Hx CVA. Continue elquis and statin  10. Asthma. COPD continue prn bronchodilators. No active issues  11. Seizure disorder. No active issues.   12. Chronic pain. Pt takes roxicodone and flexeril PTA continue. FU with pain specialist after DC once medical issues are stable.          Total time spent with patient: 30 Minutes  Care Plan discussed with: Patient, Care Manager, Nursing Staff and >50% of time spent in counseling and coordination of care    Discussed:  Care Plan and D/C Planning    Prophylaxis:  SCD's and eliquis    Disposition:  Home w/Family           ___________________________________________________    Attending Physician: Marlene BastJeffrey G Hall Birchard, MD

## 2019-02-07 NOTE — Progress Notes (Signed)
Problem: Unstable angina/NSTEMI: Day of Admission/Day 1  Goal: Activity/Safety  Outcome: Progressing Towards Goal  Goal: Diagnostic Test/Procedures  Outcome: Progressing Towards Goal  Goal: Nutrition/Diet  Outcome: Progressing Towards Goal  Goal: Medications  Outcome: Progressing Towards Goal  Goal: Respiratory  Outcome: Progressing Towards Goal  Goal: Treatments/Interventions/Procedures  Outcome: Progressing Towards Goal  Goal: Psychosocial  Outcome: Progressing Towards Goal  Goal: *Hemodynamically stable  Outcome: Progressing Towards Goal  Goal: *Optimal pain control at patient's stated goal  Outcome: Progressing Towards Goal  Goal: *Lungs clear or at baseline  Outcome: Progressing Towards Goal

## 2019-02-07 NOTE — Progress Notes (Signed)
PAGER ID: [phone]   MESSAGE: Room 6622 can patient have a diet   [name] [name] [id number]

## 2019-02-07 NOTE — Progress Notes (Signed)
Medical Progress Note      NAME: Clemencia CourseVeronica E Lasky   DOB:  12/07/1950  MRM:  [id number]    Date/Time: 02/07/2019  11:50 AM         Subjective:     Ms Randa Evensdwards is a [age]year old woman with Hx of arthritis, HTN HLD CAD, hx CVA and asthma and DM. She presented to the ED with CO intermittent CP and SOB that lasted about 30mintues and was relieved with rest while shopping at [employer]. EMS was called and she was brought to the ED. So still CO SOB, and feels her "heart skiiping beats". She denies CP presently. She CO headache this AM. She normally takes roxicodone at home for chronic pain management but it has not been reconciled on admit orders.     Past Medical History reviewed and unchanged from Admission History and Physical    Review of Systems   Constitutional: Positive for fatigue.   HENT: Negative.    Eyes: Negative.    Respiratory: Negative.    Cardiovascular: Positive for chest pain. Negative for leg swelling.   Gastrointestinal: Negative.    Endocrine: Negative.    Genitourinary: Negative.    Musculoskeletal: Positive for arthralgias and back pain.   Skin: Negative.    Neurological: Negative.    Hematological: Negative.    Psychiatric/Behavioral: Negative.             Objective:       Vitals:      Last 24hrs VS reviewed since prior progress note. Most recent are:    Visit Vitals  BP 122/70 (BP 1 Location: Right arm, BP Patient Position: Supine)   Pulse 86   Temp 98.1 ??F (36.7 ??C)   Resp 17   Ht 5\' 7"  (1.702 m)   Wt 66.4 kg (146 lb 6.2 oz)   SpO2 100%   BMI 22.93 kg/m??     SpO2 Readings from Last 6 Encounters:   02/07/19 100%   11/09/18 100%   09/08/18 99%   09/06/18 100%   03/09/18 99%   03/03/18 95%            Intake/Output Summary (Last 24 hours) at 02/07/2019 1150  Last data filed at 02/06/2019 2102  Gross per 24 hour   Intake 1000 ml   Output ???   Net 1000 ml          Exam:      Physical Exam  HENT:      Head: Normocephalic.      Mouth/Throat:      Mouth: Mucous membranes are moist.   Eyes:       Extraocular Movements: Extraocular movements intact.      Conjunctiva/sclera: Conjunctivae normal.      Pupils: Pupils are equal, round, and reactive to light.   Neck:      Musculoskeletal: Normal range of motion.   Cardiovascular:      Rate and Rhythm: Normal rate and regular rhythm.   Pulmonary:      Effort: Pulmonary effort is normal.      Breath sounds: Normal breath sounds.   Abdominal:      General: Bowel sounds are normal.      Palpations: Abdomen is soft.   Musculoskeletal: Normal range of motion.   Skin:     General: Skin is warm.   Neurological:      General: No focal deficit present.      Mental Status: She is alert.  Lab Data Reviewed: (see below)      Medications:  Current Facility-Administered Medications   Medication Dose Route Frequency   ??? apixaban (ELIQUIS) tablet 5 mg  5 mg Oral BID   ??? ascorbic acid (vitamin C) (VITAMIN C) tablet 250 mg  250 mg Oral BID   ??? calcium carbonate (OS-CAL) tablet 500 mg [elemental]  500 mg Oral DAILY   ??? omeprazole (PRILOSEC) capsule 20 mg  20 mg Oral DAILY   ??? simvastatin (ZOCOR) tablet 80 mg  80 mg Oral QHS   ??? sucralfate (CARAFATE) 100 mg/mL oral suspension 0.5 g  0.5 g Oral BID   ??? lamoTRIgine (LaMICtal) tablet 100 mg  100 mg Oral BID   ??? sodium chloride (NS) flush 5-10 mL  5-10 mL IntraVENous Q8H   ??? sodium chloride (NS) flush 5-10 mL  5-10 mL IntraVENous PRN   ??? naloxone (NARCAN) injection 0.1 mg  0.1 mg IntraVENous PRN   ??? nitroglycerin (NITROBID) 2 % ointment 1 Inch  1 Inch Topical TID   ??? 0.9% sodium chloride infusion  100 mL/hr IntraVENous CONTINUOUS   ??? oxyCODONE IR (ROXICODONE) tablet 20 mg  20 mg Oral Q4H PRN   ??? cyclobenzaprine (FLEXERIL) tablet 10 mg  10 mg Oral TID PRN       ______________________________________________________________________      Lab Review:     Recent Labs     02/07/19  0414 02/06/19  1905   WBC 6.0 6.8   HGB 8.1* 9.3*   HCT 28.1* 32.3*   PLT 290 321     Recent Labs     02/07/19  0414 02/06/19  1905   NA 140 138    K 3.9 4.6   CL 109* 108*   CO2 28 26   GLU 90 72*   BUN 6* 8   CREA 0.6 0.9   CA 8.4* 8.9   MG 2.0  --    ALB  --  3.7   ALT  --  27   INR 1.0  --      Recent Labs     02/07/19  0414   INR 1.0            Assessment:     Active Problems:    Precordial chest pain (02/06/2019)           Plan:     Risk of deterioration: medium             1. Chest pain. Evaluation in progress. CTA chest ordered. ECHo ordered. Will order nuclear stress test for the AM pending a negative CTA of the chest  2. PRN pain meds ordered. Roxicodone dose increased and flexeril PRN ordered  3. Near syncope. ECHO and telemetry monitoring in progress  4. CAD SP stent remote past. Continue ASA statin and BP control  5. HTN. Continue home meds. Reconciled and continued. Adjust as needed  6. 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 Continue eliquis. Check CTA chest  7. Anemia. Appears chronic. Monitoring trend  8. DM II continue glucommander protocol  9. Hx CVA. Continue elquis and statin  10. Asthma. COPD continue prn bronchodilators. No active issues  11. Seizure disorder. No active issues.   12. Chronic pain. Pt takes roxicodone and flexeril PTA continue. FU with pain specialist after DC once medical issues are stable.          Total time spent with patient: 30 Minutes  Care Plan discussed with: Patient, Care Manager, Nursing Staff and >50% of time spent in counseling and coordination of care    Discussed:  Care Plan and D/C Planning    Prophylaxis:  SCD's and eliquis    Disposition:  Home w/Family           ___________________________________________________    Attending Physician: Marlene BastJeffrey G Kealie Barrie, MD

## 2019-02-07 NOTE — Progress Notes (Signed)
PAGER ID: [phone]   MESSAGE: Room 6622 can she have something for a headache, patient has an allergy to Tylenol   Jessica [id number]

## 2019-02-07 NOTE — Progress Notes (Signed)
Problem: Unstable angina/NSTEMI: Day of Admission/Day 1  Goal: Activity/Safety  Outcome: Progressing Towards Goal  Goal: Diagnostic Test/Procedures  Outcome: Progressing Towards Goal  Goal: Nutrition/Diet  Outcome: Progressing Towards Goal  Goal: Medications  Outcome: Progressing Towards Goal  Goal: Respiratory  Outcome: Progressing Towards Goal  Goal: Treatments/Interventions/Procedures  Outcome: Progressing Towards Goal  Goal: Psychosocial  Outcome: Progressing Towards Goal  Goal: *Hemodynamically stable  Outcome: Progressing Towards Goal  Goal: *Optimal pain control at patient's stated goal  Outcome: Progressing Towards Goal  Goal: *Lungs clear or at baseline  Outcome: Progressing Towards Goal

## 2019-02-07 NOTE — Progress Notes (Signed)
Patient admitted on 02/06/2019 from home with   Chief Complaint   Patient presents with   ??? Chest Pain   ??? Fatigue          The patient is being treated for    PMH:   Past Medical History:   Diagnosis Date   ??? Acute hypercapnic respiratory failure (HCC) 02/05/2013    W/ Encephalopathy requiring BiPAP, Etiology Uncertain however Possible Narcotic Benzodiazipine Related?    ??? Adjustment disorder with mixed anxiety and depressed mood    ??? Aneurysm of internal carotid artery    ??? Arthritis of knee    ??? Asthma    ??? Autoimmune disease (HCC)    ??? CAD (coronary artery disease)    ??? Cervicalgia    ??? Chest pain 03/17/2014    Dobutamine Stress Echo: NORMAL WALL MOTION AND GLOBAL SYSTOLIC FUNCTION AT REST AND AFTER?? STRESS/EXERCISE WITH APPROPRIATE AUGMENTATION OF FUNCTION IN ALL SEGMENTS. NO EVIDENCE OF INDUCIBLE ISCHEMIA AT ADEQUATE HEART RATE WITH DOBUTAMINE STRESS.??            ??? Chronic low back pain 10/22/2014    [facility]: Moderate facet arthropathy at L4-L5 and L5-S1. Mild/moderate foraminal stenosis at these levels. Disc bulge with annular tear at L4-L5.    ??? Chronic pain syndrome    ??? Coccydynia    ??? Diabetes mellitus     NIDDM   ??? DVT (deep venous thrombosis) (HCC) 05/19/2007    [facility] CTA: 1. Small nonocclusive pulmonary embolus in a subsegmental branch to the left lower lobe. Larger filling defect in a segmental branch to the right lower lobe, not as low in attenuation as is normally seen with a pulmonary embolus, however is seen in multiple planes and remains concerning for an additional nonocclusive pulmonary embolus.   ??? Fibromyalgia    ??? Gastric ulcer    ??? Gastritis 01/03/2015    [facility] Admission, EGD + H. Pylori/Gastritis   ??? Generalized osteoarthritis of multiple sites    ??? GI bleed 01/01/2015    [facility] Admit 5/2-01/07/2015   ??? H. pylori infection 01/03/2015    [facility] Admission, EGD + H. Pylori/Gastritis   ??? Hemiparesis, right (HCC)    ??? Hyperlipidemia    ??? Hypertension     ??? Irregular sleep-wake rhythm    ??? Knee joint pain    ??? Lumbar spondylosis     Orthopedic Spine Surgeon: Dr. Ree KidaJack L. Henrene HawkingSiegel    ??? Muscle spasm    ??? Normocytic anemia     Chronic Iron Deficiency Anemia   ??? Osteoporosis    ??? Pancreatitis    ??? Peripheral neuropathy    ??? Pulmonary embolism (HCC)     Multiple   ??? Seizures (HCC)    ??? Sensory ataxia    ??? SLE (systemic lupus erythematosus) (HCC)    ??? Stroke [facility](HCC)    ??? Subclinical hypothyroidism    ??? Vertigo    ??? Vitamin D deficiency         Treatment Team: Treatment Team: Attending Provider: Marlene BastHolladay, Jeffrey G, MD; Consulting Provider: Otilio CarpenMorris, Anthony A, MD; Hospitalist: Fara ChuteHolladay, Versie StarksJeffrey G, MD; Care Manager: Ala DachKirkman, Monica R., RN      The patient has been admitted to the hospital 0 times in the past 12 months.        Caregivers Participating in Plan of Care/Discharge Plan with the patient: pt and CM    Tentative dc plan: home with family and resume PCA care in  home/ she denied other needs for home     Anticipated DME needs for discharge:  none    PRESCREENING COMPLETED FOR SNF n/a      Anticipated Discharge Date: 6.09      Barriers to Healthcare Success/ Readmission Risk Factors: stroke and DM see co morbidity list above        RRAT Score: High Risk            24       Total Score        3 Has Seen PCP in Last 6 Months (Yes=3, No=0)    4 Pt. Coverage (Medicare=5 , Medicaid, or Self-Pay=4)    17 Charlson Comorbidity Score (Age + Comorbid Conditions)        Criteria that do not apply:    Married. Living with Significant Other. Assisted Living. LTAC. SNF. or   Rehab    Patient Length of Stay (>5 days = 3)    IP Visits Last 12 Months (1-3=4, 4=9, >4=11)           PCP: Snellings, Balinda Quails, MD       DME available at [place] and Prior Level of Function: Lives at [address]  [place] [address] per pt her granddtr Veronique stays in home with her and pts dtr is her PCA in home daily/ per pt she can feed self but dtr assist with all over care     Prior to admission open services: yes    [place]- per pt dtr Lockie Mola is paid caregiver every day either 04-1399 or 05-1499  Extended Emergency Contact Information  Primary Emergency Contact: Larry Sierras  Address: [place] [address] [place] Phone: [phone]  Mobile Phone: [phone]  Relation: Daughter     Transportation: dtr will transport home        Case Management Assessment                          PRIMARY DECISION MAKER    self                               CARE MANAGEMENT INTERVENTIONS   Readmission Interview Completed: Not Applicable   PCP Verified by CM: Yes(visiting MD)           Mode of Transport at Discharge: Other (see comment)(dtr)       Transition of Care Consult (CM Consult): Discharge Planning               Discharge Durable Medical Equipment: Yes(rollator)   Physical Therapy Consult: No   Occupational Therapy Consult: No   Speech Therapy Consult: No   Current Support Network: Own Home, Other, Has Personal Caregivers(per pt granddtr stays with her and pts dtr is her PCA in home)   Reason for Referral: DCP Rounds   History Provided By: Patient, Medical Record   Patient Orientation: Alert and Oriented   Cognition: Alert   Support System Response: Concerned   Previous Living Arrangement: Lives with Family Dependent   Home Accessibility: Steps(2 then porch)   Prior Functional Level: Assistance with the following:, Bathing, Dressing, Housework, Cooking, Mobility, Teacher, early years/pre, Shopping   Current Functional Level: Assistance with the following:, Bathing, Dressing, Housework, Cooking, Toileting, Shopping,  Mobility       Can patient return to prior living arrangement: Yes   Ability to make needs known:: Good   Family able to assist with home care needs:: Yes               Types of Needs Identified: Disease Management Education, ADLs/IADLs, Treatment Education, Activity/Exercise    Anticipated Discharge Needs: Home Health Services                      DISCHARGE LOCATION   Discharge Placement: Home with family assistance

## 2019-02-07 NOTE — Other (Signed)
Bedside shift change report given to Marle RN (oncoming nurse) by Kenneth RN (offgoing nurse). Report included the following information SBAR, Kardex, ED Summary, Procedure Summary, Intake/Output, MAR, Recent Results, Med Rec Status and Cardiac Rhythm NSR.

## 2019-02-08 ENCOUNTER — Observation Stay: Admit: 2019-02-08 | Payer: PRIVATE HEALTH INSURANCE | Primary: Internal Medicine

## 2019-02-08 LAB — METABOLIC PANEL, BASIC
Anion gap: 3 mmol/L — ABNORMAL LOW (ref 5–15)
BUN: 5 mg/dl — ABNORMAL LOW (ref 7–25)
CO2: 29 mEq/L (ref 21–32)
Calcium: 8.9 mg/dl (ref 8.5–10.1)
Chloride: 108 mEq/L — ABNORMAL HIGH (ref 98–107)
Creatinine: 0.6 mg/dl (ref 0.6–1.3)
GFR est AA: >60
GFR est non-AA: >60
Glucose: 89 mg/dl (ref 74–106)
Potassium: 4 mEq/L (ref 3.5–5.1)
Sodium: 139 mEq/L (ref 136–145)

## 2019-02-08 LAB — CBC WITH AUTOMATED DIFF
BASOPHILS: 0.6 % (ref 0–3)
EOSINOPHILS: 3.6 % (ref 0–5)
HCT: 27.9 % — ABNORMAL LOW (ref 37.0–50.0)
HGB: 8.1 gm/dl — ABNORMAL LOW (ref 13.0–17.2)
IMMATURE GRANULOCYTES: 0.2 % (ref 0.0–3.0)
LYMPHOCYTES: 24.8 % — ABNORMAL LOW (ref 28–48)
MCH: 22 pg — ABNORMAL LOW (ref 25.4–34.6)
MCHC: 29 gm/dl — ABNORMAL LOW (ref 30.0–36.0)
MCV: 75.8 fL — ABNORMAL LOW (ref 80.0–98.0)
MONOCYTES: 8.6 % (ref 1–13)
MPV: 10.6 fL — ABNORMAL HIGH (ref 6.0–10.0)
NEUTROPHILS: 62.2 % (ref 34–64)
NRBC: 0 (ref 0–0)
PLATELET: 277 10*3/uL (ref 140–450)
RBC: 3.68 M/uL (ref 3.60–5.20)
RDW-SD: 52.7 — ABNORMAL HIGH (ref 36.4–46.3)
WBC: 6.2 10*3/uL (ref 4.0–11.0)

## 2019-02-08 LAB — GLUCOSE, POC
Glucose (POC): 109 mg/dL — ABNORMAL HIGH (ref 65–105)
Glucose (POC): 118 mg/dL — ABNORMAL HIGH (ref 65–105)

## 2019-02-08 LAB — CBC WITH AUTO DIFFERENTIAL
Basophils %: 0.6 % (ref 0–3)
Eosinophils %: 3.6 % (ref 0–5)
Hematocrit: 27.9 % — ABNORMAL LOW (ref 37.0–50.0)
Hemoglobin: 8.1 gm/dl — ABNORMAL LOW (ref 13.0–17.2)
Immature Granulocytes: 0.2 % (ref 0.0–3.0)
Lymphocytes %: 24.8 % — ABNORMAL LOW (ref 28–48)
MCH: 22 pg — ABNORMAL LOW (ref 25.4–34.6)
MCHC: 29 gm/dl — ABNORMAL LOW (ref 30.0–36.0)
MCV: 75.8 fL — ABNORMAL LOW (ref 80.0–98.0)
MPV: 10.6 fL — ABNORMAL HIGH (ref 6.0–10.0)
Monocytes %: 8.6 % (ref 1–13)
Neutrophils %: 62.2 % (ref 34–64)
Nucleated RBCs: 0 (ref 0–0)
Platelets: 277 10*3/uL (ref 140–450)
RBC: 3.68 M/uL (ref 3.60–5.20)
RDW-SD: 52.7 — ABNORMAL HIGH (ref 36.4–46.3)
WBC: 6.2 10*3/uL (ref 4.0–11.0)

## 2019-02-08 LAB — POCT GLUCOSE
POC Glucose: 109 mg/dL — ABNORMAL HIGH (ref 65–105)
POC Glucose: 118 mg/dL — ABNORMAL HIGH (ref 65–105)

## 2019-02-08 LAB — BASIC METABOLIC PANEL
Anion Gap: 3 mmol/L — ABNORMAL LOW (ref 5–15)
BUN: 5 mg/dl — ABNORMAL LOW (ref 7–25)
CO2: 29 mEq/L (ref 21–32)
Calcium: 8.9 mg/dl (ref 8.5–10.1)
Chloride: 108 mEq/L — ABNORMAL HIGH (ref 98–107)
Creatinine: 0.6 mg/dl (ref 0.6–1.3)
EGFR IF NonAfrican American: >60
GFR African American: >60
Glucose: 89 mg/dl (ref 74–106)
Potassium: 4 mEq/L (ref 3.5–5.1)
Sodium: 139 mEq/L (ref 136–145)

## 2019-02-08 LAB — NM MYOCARDIAL SPECT REST EXERCISE OR RX: Left Ventricular Ejection Fraction: 81

## 2019-02-08 MED ORDER — SUCRALFATE 100 MG/ML ORAL SUSP
100 mg/mL | Freq: Two times a day (BID) | ORAL | Status: DC
Start: 2019-02-08 — End: 2019-02-08
  Administered 2019-02-08: 20:00:00 via ORAL

## 2019-02-08 MED ORDER — TECHNETIUM TC 99M SESTAMIBI - CARDIOLITE
Freq: Once | Status: AC
Start: 2019-02-08 — End: 2019-02-08
  Administered 2019-02-08: 18:00:00 via INTRAVENOUS

## 2019-02-08 MED ORDER — SODIUM CHLORIDE 0.9 % IJ SYRG
Freq: Once | INTRAMUSCULAR | Status: AC
Start: 2019-02-08 — End: 2019-02-08
  Administered 2019-02-08: 16:00:00 via INTRAVENOUS

## 2019-02-08 MED ORDER — GABAPENTIN 100 MG CAP
100 mg | ORAL_CAPSULE | Freq: Two times a day (BID) | ORAL | 0 refills | Status: AC
Start: 2019-02-08 — End: ?

## 2019-02-08 MED ORDER — NALOXONE 0.4 MG/ML INJECTION
0.4 mg/mL | INTRAMUSCULAR | 0 refills | Status: AC | PRN
Start: 2019-02-08 — End: ?

## 2019-02-08 MED ORDER — GABAPENTIN 100 MG CAP
100 mg | Freq: Three times a day (TID) | ORAL | Status: DC
Start: 2019-02-08 — End: 2019-02-08
  Administered 2019-02-08 (×2): via ORAL

## 2019-02-08 MED ORDER — REGADENOSON 0.4 MG/5 ML IV SYRINGE
0.4 mg/5 mL | Freq: Once | INTRAVENOUS | Status: AC
Start: 2019-02-08 — End: 2019-02-08
  Administered 2019-02-08: 16:00:00 via INTRAVENOUS

## 2019-02-08 MED ORDER — OXYCODONE ER 10 MG TABLET,CRUSH RESISTANT,EXTENDED RELEASE 12 HR
10 mg | ORAL_TABLET | Freq: Two times a day (BID) | ORAL | 0 refills | Status: AC
Start: 2019-02-08 — End: 2019-03-10

## 2019-02-08 MED ORDER — OXYCODONE 15 MG TAB
15 mg | ORAL_TABLET | Freq: Four times a day (QID) | ORAL | 0 refills | Status: AC
Start: 2019-02-08 — End: 2019-02-11

## 2019-02-08 MED FILL — NITRO-BID 2 % TRANSDERMAL OINTMENT: 2 % | TRANSDERMAL | Qty: 1

## 2019-02-08 MED FILL — SUCRALFATE 100 MG/ML ORAL SUSP: 100 mg/mL | ORAL | Qty: 10

## 2019-02-08 MED FILL — LEXISCAN 0.4 MG/5 ML INTRAVENOUS SYRINGE: 0.4 mg/5 mL | INTRAVENOUS | Qty: 5

## 2019-02-08 MED FILL — BD POSIFLUSH NORMAL SALINE 0.9 % INJECTION SYRINGE: INTRAMUSCULAR | Qty: 10

## 2019-02-08 MED FILL — OMEPRAZOLE 20 MG CAP, DELAYED RELEASE: 20 mg | ORAL | Qty: 1

## 2019-02-08 MED FILL — GABAPENTIN 100 MG CAP: 100 mg | ORAL | Qty: 1

## 2019-02-08 MED FILL — SIMVASTATIN 40 MG TAB: 40 mg | ORAL | Qty: 2

## 2019-02-08 MED FILL — OXYCODONE 5 MG TAB: 5 mg | ORAL | Qty: 1

## 2019-02-08 MED FILL — LAMOTRIGINE 100 MG TAB: 100 mg | ORAL | Qty: 1

## 2019-02-08 MED FILL — NORMAL SALINE FLUSH 0.9 % INJECTION SYRINGE: INTRAMUSCULAR | Qty: 10

## 2019-02-08 MED FILL — VITAMIN C 500 MG TABLET: 500 mg | ORAL | Qty: 1

## 2019-02-08 MED FILL — ELIQUIS 5 MG TABLET: 5 mg | ORAL | Qty: 1

## 2019-02-08 MED FILL — OYSTER SHELL CALCIUM 500  500 MG CALCIUM (1,250 MG) TABLET: 500 mg calcium (1,250 mg) | ORAL | Qty: 1

## 2019-02-08 NOTE — Progress Notes (Signed)
Spoke to lab in regards to processing labs and the need for results be fore doing a NST

## 2019-02-08 NOTE — Discharge Summary (Signed)
Discharge Summary   Admit Date: 02/06/2019  Discharge Date:  02/08/2019    Patient ID:  [name] [name]  68 y.o.  03/31/1951    Chief Complaint   Patient presents with   ??? Chest Pain   ??? Fatigue       Patient Active Problem List    Diagnosis Date Noted   ??? Precordial chest pain 02/06/2019   ??? Low back pain 11/13/2017   ??? Dizziness 11/13/2017   ??? Cervical strain 11/13/2017   ??? Closed head injury 11/13/2017   ??? Numbness and tingling of left leg 11/13/2017   ??? Contusion of left leg 11/13/2017   ??? Abdominal muscle strain 11/13/2017   ??? On continuous oral anticoagulation 11/13/2017   ??? Syncope and collapse 02/18/2017   ??? Hypokalemia 02/18/2017   ??? Syncope 02/18/2017   ??? Left rib fracture 02/18/2017   ??? Pneumoperitoneum 12/26/2015   ??? Acute CVA (cerebrovascular accident) (HCC) 07/17/2015   ??? Chest pain 05/19/2015   ??? Gastrointestinal hemorrhage 01/01/2015   ??? GI bleed 11/26/2014   ??? Back pain, lumbosacral 09/11/2011   ??? Arthritis of knee 01/04/2011   ??? Encounter for long-term (current) use of other medications 01/04/2011   ??? Abdominal pain, generalized 01/04/2011   ??? 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 Lupus (HCC) 12/03/2010   ??? Osteoarthrosis, unspecified whether generalized or localized, unspecified site 12/03/2010   ??? Neuralgia, neuritis, and radiculitis, unspecified 12/03/2010   ??? Myalgia and myositis, unspecified 12/03/2010   ??? Pain in limb 12/03/2010   ??? Pain in joint, multiple sites 12/03/2010   ??? Type II or unspecified type diabetes mellitus without mention of complication, not stated as uncontrolled 12/03/2010   ??? Rib pain 10/10/2010       Discharge Diagnosis:    1. Precordial chest pain due to DJD and chronic arthritis and chronic pain syndrome  2. Hx of lupus  3. Chronic atrial fibrillation  4. HTN  5. HLD  6 CAD  7 Hx CVA  8 Asthma  9 DM II  10 near syncope. Cause unable to be determined  11. 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 Chronic  pain syndrome      Current Discharge Medication List      START taking these medications    Details   gabapentin (NEURONTIN) 100 mg capsule Take 1 Cap by mouth two (2) times a day. Max Daily Amount: 200 mg.  Qty: 60 Cap, Refills: 0    Associated Diagnoses: Rib pain      naloxone (NARCAN) 0.4 mg/mL injection 0.25 mL by IntraVENous route as needed for Other (Give if breaths per minute is less than or equal to 8, may repeat dose in 15 minutes and contact MD.).  Qty: 1 mL, Refills: 0      oxyCODONE ER (OxyCONTIN) 10 mg ER tablet Take 1 Tab by mouth every twelve (12) hours for 30 days. Max Daily Amount: 20 mg.  Qty: 12 Tab, Refills: 0    Associated Diagnoses: Lupus (HCC); Rib pain; Pain in joint, multiple sites         CONTINUE these medications which have CHANGED    Details   oxyCODONE IR (OXY-IR) 15 mg immediate release tablet Take 1 Tab by mouth four (4) times daily for 3 days. Max Daily Amount: 60 mg.  Qty: 10 Tab, Refills: 0    Associated Diagnoses:  Precordial chest pain         CONTINUE these medications which have NOT CHANGED    Details   calcium carbonate (CALTREX) 600 mg calcium (1,500 mg) tablet Take 600 mg by mouth daily.      apixaban (ELIQUIS) 5 mg (74 tabs) starter pack Take 10 mg (two 5 mg tablets) by mouth twice a day for 7 days   Followed by 5 mg (one 5 mg tablet) by mouth twice a day  Qty: 1 Dose Pack, Refills: 0      omeprazole (PRILOSEC) 20 mg capsule Take 20 mg by mouth daily.      sucralfate (CARAFATE) 100 mg/mL suspension Take 1 tsp by mouth two (2) times a day.      ascorbic acid, vitamin C, (VITAMIN C) 250 mg tablet Take 1 Tab by mouth two (2) times a day. Take with iron pill  Qty: 60 Tab, Refills: 3      simvastatin (ZOCOR) 80 mg tablet Take 80 mg by mouth nightly.      lamoTRIgine (LAMICTAL) 100 mg tablet Take 1 Tab by mouth two (2) times a day.  Qty: 60 Tab, Refills: 0      Cholecalciferol, Vitamin D3, (VITAMIN D) 1,000 unit Cap Take 50,000 Units by mouth every Sunday.      ondansetron hcl  (ZOFRAN) 4 mg tablet Take 1 Tab by mouth every eight (8) hours as needed for Nausea.  Qty: 15 Tab, Refills: 0      cyclobenzaprine (FLEXERIL) 10 mg tablet Take 10 mg by mouth three (3) times daily as needed for Muscle Spasm(s).             Operative Procedures:  none    Consultants:  none    Hospital Course:  [name] [name] is a [age] year old woman with past history of chronic pain due to arthritis, lupus, HTN, HLD. CAD hx of CVA, asthma and DM II. She presented to the  ED with CO Chest pain and SOB that lasted 30 minutes while she was shopping [place] [employer]. EMS was called and she was brought to the ED. Evaluation in the ED was unremarkable and intiial cardiac enzymes were negative. Admission was recommended for further testing and management.     She was admitted and her home medications were reconciled and continued. She asked for increased doses on her home narcotic meds. Oxycontin was increased to 20mg  while here. A CTA of the chest was done that is normal and negative for PE or aneurism. An ECHO was done that shows a normal LVSF and normal valves. A nuclear stress test was done that is negative for ischemia. Her CP has improved since admission She is advised to keep her FU with her pain speicalist which she says is pending prior to this admission. A limited Rx for oxycontin and roxycodone were provided per request with instructions to FU with her PCP for further management as an outpatient.     Physical Exam on Discharge:  Visit Vitals  BP (!) 129/91 (BP 1 Location: Left arm, BP Patient Position: Supine)   Pulse 93   Temp 98.1 ??F (36.7 ??C)   Resp 18   Ht 5\' 7"  (1.702 m)   Wt 67.2 kg (148 lb 2.4 oz)   SpO2 100%   BMI 23.20 kg/m??         Constitutional: well developed, nourished, no distress and alert and oriented x 3   HENT: atraumatic, nose normal, normocephalic, left exterior ear normal,  right external ear normal and oropharynx clear and moist   Eyes: conjunctiva normal, EOM normal and PERRL   Neck: ROM normal,  supple, trachea normal and cervical adenopathy   Cardiovascular: heart sounds normal, intact distal pulses, normal rate and regular rhythm   Pulmonary/Chest Wall: breath sounds normal and effort normal   Abdominal: appearance normal, bowel sounds normal and soft   Genitourinary/Anorectal: deferred   Musculoskeletal: normal ROM   Neurological: awake, alert and oriented x 3 and gait normal   Skin: dry, intact and warm   Psych:  appropriate               Most Recent BMP and CBC:    Basic Metabolic Profile   Lab Results   Component Value Date/Time    Sodium 139 02/08/2019 07:17 AM    Sodium 140 02/07/2019 04:14 AM    Sodium 138 02/06/2019 07:05 PM    Potassium 4.0 02/08/2019 07:17 AM    Potassium 3.9 02/07/2019 04:14 AM    Potassium 4.6 02/06/2019 07:05 PM    Chloride 108 (H) 02/08/2019 07:17 AM    Chloride 109 (H) 02/07/2019 04:14 AM    Chloride 108 (H) 02/06/2019 07:05 PM    CO2 29 02/08/2019 07:17 AM    CO2 28 02/07/2019 04:14 AM    CO2 26 02/06/2019 07:05 PM    Anion gap 3 (L) 02/08/2019 07:17 AM    Anion gap 3 (L) 02/07/2019 04:14 AM    Anion gap 4 (L) 02/06/2019 07:05 PM    Glucose 89 02/08/2019 07:17 AM    Glucose 90 02/07/2019 04:14 AM    Glucose 72 (L) 02/06/2019 07:05 PM    BUN 5 (L) 02/08/2019 07:17 AM    BUN 6 (L) 02/07/2019 04:14 AM    BUN 8 02/06/2019 07:05 PM    Creatinine 0.6 02/08/2019 07:17 AM    Creatinine 0.6 02/07/2019 04:14 AM    Creatinine 0.9 02/06/2019 07:05 PM    BUN/Creatinine ratio 17 02/26/2015 08:20 PM    BUN/Creatinine ratio 9 (L) 04/28/2011 09:01 PM    GFR est AA >60.0 02/08/2019 07:17 AM    GFR est AA >60.0 02/07/2019 04:14 AM    GFR est AA >60.0 02/06/2019 07:05 PM    GFR est non-AA >60 02/08/2019 07:17 AM    GFR est non-AA >60 02/07/2019 04:14 AM    GFR est non-AA >60 02/06/2019 07:05 PM    Calcium 8.9 02/08/2019 07:17 AM    Calcium 8.4 (L) 02/07/2019 04:14 AM    Calcium 8.9 02/06/2019 07:05 PM           CBC w/Diff    Lab Results   Component Value Date/Time    WBC 6.2 02/08/2019 07:17  AM    WBC 6.0 02/07/2019 04:14 AM    WBC 6.8 02/06/2019 07:05 PM    HGB 8.1 (L) 02/08/2019 07:17 AM    HGB 8.1 (L) 02/07/2019 04:14 AM    HGB 9.3 (L) 02/06/2019 07:05 PM    HCT 27.9 (L) 02/08/2019 07:17 AM    HCT 28.1 (L) 02/07/2019 04:14 AM    HCT 32.3 (L) 02/06/2019 07:05 PM    PLATELET 277 02/08/2019 07:17 AM    PLATELET 290 02/07/2019 04:14 AM    PLATELET 321 02/06/2019 07:05 PM    MCV 75.8 (L) 02/08/2019 07:17 AM    MCV 75.3 (L) 02/07/2019 04:14 AM    MCV 75.8 (L) 02/06/2019 07:05 PM       Recent Labs  02/08/19  0717 02/07/19  0414 02/06/19  1905   WBC 6.2 6.0 6.8   RBC 3.68 3.73 4.26   HGB 8.1* 8.1* 9.3*   HCT 27.9* 28.1* 32.3*   PLT 277 290 321   GRANS 62.2 58.2 62.2   LYMPH 24.8* 29.9 26.2*   MONOS 8.6 7.5 7.1   EOS 3.6 3.5 3.2   BASOS 0.6 0.7 1.2              Condition at discharge: Afebrile  Ambulating  Eating, Drinking, Voiding  Stable    Disposition:  Home    Home or Self Care    PCP:  [name], [name], MD

## 2019-02-08 NOTE — Progress Notes (Signed)
 2D echocardiogram was completed.

## 2019-02-08 NOTE — Other (Addendum)
----------  DocumentID: [id number]------------------------------------------------              [facility]                       Patient Education Report         Name: [name] [name], [name] [name]                  Date: 02/07/2019    MRN: [id number]                    Time: 12:12:05 AM         Patient ordered video: 'Patient Safety: Stay Safe While you are in the Hospital'    from [id number] via phone number: [phone] at 12:12:05 AM    Description: This program outlines some of the precautions patients can take to ensure a speedy recovery without extra complications. The video emphasizes the importance of communicating with the healthcare team.    ----------DocumentID: [id number]------------------------------------------------                       [facility]          Patient Education Report - Discharge Summary        Date: 02/08/2019   Time: 6:25:14 PM   Name: YAMILKA, LOPICCOLO   MRN: [id number]      Account Number: [id number]      Education History:        Patient ordered video: 'Patient Safety: Stay Safe While you are in the Hospital' from [id number] on 02/07/2019 12:12:05 AM

## 2019-02-08 NOTE — Progress Notes (Signed)
2D echocardiogram was completed.

## 2019-02-08 NOTE — Discharge Summary (Signed)
Discharge Summary   Admit Date: 02/06/2019  Discharge Date:  02/08/2019    Patient ID:  [name] [name]  68 y.o.  11/12/1950    Chief Complaint   Patient presents with   ??? Chest Pain   ??? Fatigue       Patient Active Problem List    Diagnosis Date Noted   ??? Precordial chest pain 02/06/2019   ??? Low back pain 11/13/2017   ??? Dizziness 11/13/2017   ??? Cervical strain 11/13/2017   ??? Closed head injury 11/13/2017   ??? Numbness and tingling of left leg 11/13/2017   ??? Contusion of left leg 11/13/2017   ??? Abdominal muscle strain 11/13/2017   ??? On continuous oral anticoagulation 11/13/2017   ??? Syncope and collapse 02/18/2017   ??? Hypokalemia 02/18/2017   ??? Syncope 02/18/2017   ??? Left rib fracture 02/18/2017   ??? Pneumoperitoneum 12/26/2015   ??? Acute CVA (cerebrovascular accident) (HCC) 07/17/2015   ??? Chest pain 05/19/2015   ??? Gastrointestinal hemorrhage 01/01/2015   ??? GI bleed 11/26/2014   ??? Back pain, lumbosacral 09/11/2011   ??? Arthritis of knee 01/04/2011   ??? Encounter for long-term (current) use of other medications 01/04/2011   ??? Abdominal pain, generalized 01/04/2011   ??? 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 Lupus (HCC) 12/03/2010   ??? Osteoarthrosis, unspecified whether generalized or localized, unspecified site 12/03/2010   ??? Neuralgia, neuritis, and radiculitis, unspecified 12/03/2010   ??? Myalgia and myositis, unspecified 12/03/2010   ??? Pain in limb 12/03/2010   ??? Pain in joint, multiple sites 12/03/2010   ??? Type II or unspecified type diabetes mellitus without mention of complication, not stated as uncontrolled 12/03/2010   ??? Rib pain 10/10/2010       Discharge Diagnosis:    1. Precordial chest pain due to DJD and chronic arthritis and chronic pain syndrome  2. Hx of lupus  3. Chronic atrial fibrillation  4. HTN  5. HLD  6 CAD  7 Hx CVA  8 Asthma  9 DM II  10 near syncope. Cause unable to be determined  11. 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 Chronic pain syndrome      Current Discharge Medication List      START taking these medications    Details   gabapentin (NEURONTIN) 100 mg capsule Take 1 Cap by mouth two (2) times a day. Max Daily Amount: 200 mg.  Qty: 60 Cap, Refills: 0    Associated Diagnoses: Rib pain      naloxone (NARCAN) 0.4 mg/mL injection 0.25 mL by IntraVENous route as needed for Other (Give if breaths per minute is less than or equal to 8, may repeat dose in 15 minutes and contact MD.).  Qty: 1 mL, Refills: 0      oxyCODONE ER (OxyCONTIN) 10 mg ER tablet Take 1 Tab by mouth every twelve (12) hours for 30 days. Max Daily Amount: 20 mg.  Qty: 12 Tab, Refills: 0    Associated Diagnoses: Lupus (HCC); Rib pain; Pain in joint, multiple sites         CONTINUE these medications which have CHANGED    Details   oxyCODONE IR (OXY-IR) 15 mg immediate release tablet Take 1 Tab by mouth four (4) times daily for 3 days. Max Daily Amount: 60 mg.  Qty: 10 Tab, Refills: 0    Associated Diagnoses:  Precordial chest pain         CONTINUE these medications which have NOT CHANGED    Details   calcium carbonate (CALTREX) 600 mg calcium (1,500 mg) tablet Take 600 mg by mouth daily.      apixaban (ELIQUIS) 5 mg (74 tabs) starter pack Take 10 mg (two 5 mg tablets) by mouth twice a day for 7 days   Followed by 5 mg (one 5 mg tablet) by mouth twice a day  Qty: 1 Dose Pack, Refills: 0      omeprazole (PRILOSEC) 20 mg capsule Take 20 mg by mouth daily.      sucralfate (CARAFATE) 100 mg/mL suspension Take 1 tsp by mouth two (2) times a day.      ascorbic acid, vitamin C, (VITAMIN C) 250 mg tablet Take 1 Tab by mouth two (2) times a day. Take with iron pill  Qty: 60 Tab, Refills: 3      simvastatin (ZOCOR) 80 mg tablet Take 80 mg by mouth nightly.      lamoTRIgine (LAMICTAL) 100 mg tablet Take 1 Tab by mouth two (2) times a day.  Qty: 60 Tab, Refills: 0      Cholecalciferol, Vitamin D3, (VITAMIN D) 1,000 unit Cap Take 50,000 Units by mouth every Sunday.       ondansetron hcl (ZOFRAN) 4 mg tablet Take 1 Tab by mouth every eight (8) hours as needed for Nausea.  Qty: 15 Tab, Refills: 0      cyclobenzaprine (FLEXERIL) 10 mg tablet Take 10 mg by mouth three (3) times daily as needed for Muscle Spasm(s).             Operative Procedures:  none    Consultants:  none    Hospital Course:  [name] [name] is a 68 year old woman with past history of chronic pain due to arthritis, lupus, HTN, HLD. CAD hx of CVA, asthma and DM II. She presented to the  ED with CO Chest pain and SOB that lasted 30 minutes while she was shopping at [place]. EMS was called and she was brought to the ED. Evaluation in the ED was unremarkable and intiial cardiac enzymes were negative. Admission was recommended for further testing and management.     She was admitted and her home medications were reconciled and continued. She asked for increased doses on her home narcotic meds. Oxycontin was increased to 20mg  while here. A CTA of the chest was done that is normal and negative for PE or aneurism. An ECHO was done that shows a normal LVSF and normal valves. A nuclear stress test was done that is negative for ischemia. Her CP has improved since admission She is advised to keep her FU with her pain speicalist which she says is pending prior to this admission. A limited Rx for oxycontin and roxycodone were provided per request with instructions to FU with her PCP for further management as an outpatient.     Physical Exam on Discharge:  Visit Vitals  BP (!) 129/91 (BP 1 Location: Left arm, BP Patient Position: Supine)   Pulse 93   Temp 98.1 ??F (36.7 ??C)   Resp 18   Ht 5\' 7"  (1.702 m)   Wt 67.2 kg (148 lb 2.4 oz)   SpO2 100%   BMI 23.20 kg/m??         Constitutional: well developed, nourished, no distress and alert and oriented x 3   HENT: atraumatic, nose normal, normocephalic, left exterior ear normal,  right external ear normal and oropharynx clear and moist   Eyes: conjunctiva normal, EOM normal and PERRL    Neck: ROM normal, supple, trachea normal and cervical adenopathy   Cardiovascular: heart sounds normal, intact distal pulses, normal rate and regular rhythm   Pulmonary/Chest Wall: breath sounds normal and effort normal   Abdominal: appearance normal, bowel sounds normal and soft   Genitourinary/Anorectal: deferred   Musculoskeletal: normal ROM   Neurological: awake, alert and oriented x 3 and gait normal   Skin: dry, intact and warm   Psych:  appropriate               Most Recent BMP and CBC:    Basic Metabolic Profile   Lab Results   Component Value Date/Time    Sodium 139 02/08/2019 07:17 AM    Sodium 140 02/07/2019 04:14 AM    Sodium 138 02/06/2019 07:05 PM    Potassium 4.0 02/08/2019 07:17 AM    Potassium 3.9 02/07/2019 04:14 AM    Potassium 4.6 02/06/2019 07:05 PM    Chloride 108 (H) 02/08/2019 07:17 AM    Chloride 109 (H) 02/07/2019 04:14 AM    Chloride 108 (H) 02/06/2019 07:05 PM    CO2 29 02/08/2019 07:17 AM    CO2 28 02/07/2019 04:14 AM    CO2 26 02/06/2019 07:05 PM    Anion gap 3 (L) 02/08/2019 07:17 AM    Anion gap 3 (L) 02/07/2019 04:14 AM    Anion gap 4 (L) 02/06/2019 07:05 PM    Glucose 89 02/08/2019 07:17 AM    Glucose 90 02/07/2019 04:14 AM    Glucose 72 (L) 02/06/2019 07:05 PM    BUN 5 (L) 02/08/2019 07:17 AM    BUN 6 (L) 02/07/2019 04:14 AM    BUN 8 02/06/2019 07:05 PM    Creatinine 0.6 02/08/2019 07:17 AM    Creatinine 0.6 02/07/2019 04:14 AM    Creatinine 0.9 02/06/2019 07:05 PM    BUN/Creatinine ratio 17 02/26/2015 08:20 PM    BUN/Creatinine ratio 9 (L) 04/28/2011 09:01 PM    GFR est AA >60.0 02/08/2019 07:17 AM    GFR est AA >60.0 02/07/2019 04:14 AM    GFR est AA >60.0 02/06/2019 07:05 PM    GFR est non-AA >60 02/08/2019 07:17 AM    GFR est non-AA >60 02/07/2019 04:14 AM    GFR est non-AA >60 02/06/2019 07:05 PM    Calcium 8.9 02/08/2019 07:17 AM    Calcium 8.4 (L) 02/07/2019 04:14 AM    Calcium 8.9 02/06/2019 07:05 PM           CBC w/Diff    Lab Results   Component Value Date/Time     WBC 6.2 02/08/2019 07:17 AM    WBC 6.0 02/07/2019 04:14 AM    WBC 6.8 02/06/2019 07:05 PM    HGB 8.1 (L) 02/08/2019 07:17 AM    HGB 8.1 (L) 02/07/2019 04:14 AM    HGB 9.3 (L) 02/06/2019 07:05 PM    HCT 27.9 (L) 02/08/2019 07:17 AM    HCT 28.1 (L) 02/07/2019 04:14 AM    HCT 32.3 (L) 02/06/2019 07:05 PM    PLATELET 277 02/08/2019 07:17 AM    PLATELET 290 02/07/2019 04:14 AM    PLATELET 321 02/06/2019 07:05 PM    MCV 75.8 (L) 02/08/2019 07:17 AM    MCV 75.3 (L) 02/07/2019 04:14 AM    MCV 75.8 (L) 02/06/2019 07:05 PM       Recent Labs  02/08/19  0717 02/07/19  0414 02/06/19  1905   WBC 6.2 6.0 6.8   RBC 3.68 3.73 4.26   HGB 8.1* 8.1* 9.3*   HCT 27.9* 28.1* 32.3*   PLT 277 290 321   GRANS 62.2 58.2 62.2   LYMPH 24.8* 29.9 26.2*   MONOS 8.6 7.5 7.1   EOS 3.6 3.5 3.2   BASOS 0.6 0.7 1.2              Condition at discharge: Afebrile  Ambulating  Eating, Drinking, Voiding  Stable    Disposition:  Home    Home or Self Care    PCP:  [name], [name], MD

## 2019-02-08 NOTE — Progress Notes (Signed)
Spoke to lab in regards to processing labs and the need for results be fore doing a NST

## 2019-11-17 ENCOUNTER — Inpatient Hospital Stay (HOSPITAL_COMMUNITY)
Admission: EM | Admit: 2019-11-17 | Discharge: 2019-11-25 | DRG: 908 | Disposition: A | Discharge status: Home Health | Payer: Medicaid - Out of State | Attending: Family Medicine | Admitting: Family Medicine

## 2019-11-17 ENCOUNTER — Other Ambulatory Visit: Payer: Self-pay

## 2019-11-17 ENCOUNTER — Encounter (HOSPITAL_COMMUNITY): Payer: Self-pay

## 2019-11-17 ENCOUNTER — Emergency Department (HOSPITAL_COMMUNITY): Payer: Medicaid - Out of State

## 2019-11-17 DIAGNOSIS — T8131XA Disruption of external operation (surgical) wound, not elsewhere classified, initial encounter: Principal | ICD-10-CM | POA: Diagnosis present

## 2019-11-17 DIAGNOSIS — E118 Type 2 diabetes mellitus with unspecified complications: Secondary | ICD-10-CM | POA: Diagnosis present

## 2019-11-17 DIAGNOSIS — E119 Type 2 diabetes mellitus without complications: Secondary | ICD-10-CM

## 2019-11-17 DIAGNOSIS — Z87891 Personal history of nicotine dependence: Secondary | ICD-10-CM

## 2019-11-17 DIAGNOSIS — F411 Generalized anxiety disorder: Secondary | ICD-10-CM | POA: Diagnosis present

## 2019-11-17 DIAGNOSIS — T8469XA Infection and inflammatory reaction due to internal fixation device of other site, initial encounter: Secondary | ICD-10-CM | POA: Diagnosis present

## 2019-11-17 DIAGNOSIS — F419 Anxiety disorder, unspecified: Secondary | ICD-10-CM | POA: Diagnosis present

## 2019-11-17 DIAGNOSIS — R569 Unspecified convulsions: Secondary | ICD-10-CM

## 2019-11-17 DIAGNOSIS — Z79899 Other long term (current) drug therapy: Secondary | ICD-10-CM

## 2019-11-17 DIAGNOSIS — B9561 Methicillin susceptible Staphylococcus aureus infection as the cause of diseases classified elsewhere: Secondary | ICD-10-CM | POA: Diagnosis present

## 2019-11-17 DIAGNOSIS — G40909 Epilepsy, unspecified, not intractable, without status epilepticus: Secondary | ICD-10-CM

## 2019-11-17 DIAGNOSIS — Z886 Allergy status to analgesic agent status: Secondary | ICD-10-CM

## 2019-11-17 DIAGNOSIS — Y831 Surgical operation with implant of artificial internal device as the cause of abnormal reaction of the patient, or of later complication, without mention of misadventure at the time of the procedure: Secondary | ICD-10-CM | POA: Diagnosis present

## 2019-11-17 DIAGNOSIS — Z91048 Other nonmedicinal substance allergy status: Secondary | ICD-10-CM

## 2019-11-17 DIAGNOSIS — Y792 Prosthetic and other implants, materials and accessory orthopedic devices associated with adverse incidents: Secondary | ICD-10-CM | POA: Diagnosis present

## 2019-11-17 DIAGNOSIS — M79672 Pain in left foot: Secondary | ICD-10-CM | POA: Diagnosis present

## 2019-11-17 DIAGNOSIS — E785 Hyperlipidemia, unspecified: Secondary | ICD-10-CM | POA: Diagnosis present

## 2019-11-17 DIAGNOSIS — L03116 Cellulitis of left lower limb: Secondary | ICD-10-CM | POA: Diagnosis present

## 2019-11-17 DIAGNOSIS — G44209 Tension-type headache, unspecified, not intractable: Secondary | ICD-10-CM | POA: Diagnosis not present

## 2019-11-17 DIAGNOSIS — T847XXA Infection and inflammatory reaction due to other internal orthopedic prosthetic devices, implants and grafts, initial encounter: Secondary | ICD-10-CM

## 2019-11-17 DIAGNOSIS — M25562 Pain in left knee: Secondary | ICD-10-CM | POA: Diagnosis not present

## 2019-11-17 DIAGNOSIS — Z90710 Acquired absence of both cervix and uterus: Secondary | ICD-10-CM

## 2019-11-17 DIAGNOSIS — M329 Systemic lupus erythematosus, unspecified: Secondary | ICD-10-CM | POA: Diagnosis present

## 2019-11-17 DIAGNOSIS — D509 Iron deficiency anemia, unspecified: Secondary | ICD-10-CM | POA: Diagnosis present

## 2019-11-17 DIAGNOSIS — Z79891 Long term (current) use of opiate analgesic: Secondary | ICD-10-CM

## 2019-11-17 DIAGNOSIS — Z20822 Contact with and (suspected) exposure to covid-19: Secondary | ICD-10-CM | POA: Diagnosis present

## 2019-11-17 DIAGNOSIS — E11649 Type 2 diabetes mellitus with hypoglycemia without coma: Secondary | ICD-10-CM | POA: Diagnosis not present

## 2019-11-17 HISTORY — DX: Type 2 diabetes mellitus without complications: E11.9

## 2019-11-17 HISTORY — DX: Dizziness and giddiness: R42

## 2019-11-17 HISTORY — DX: Reserved for concepts with insufficient information to code with codable children: IMO0002

## 2019-11-17 HISTORY — DX: Systemic lupus erythematosus, unspecified: M32.9

## 2019-11-17 HISTORY — DX: Unspecified convulsions: R56.9

## 2019-11-17 LAB — COMPREHENSIVE METABOLIC PANEL
ALT: 14 U/L (ref 0–44)
AST: 17 U/L (ref 15–41)
Albumin: 3.3 g/dL — ABNORMAL LOW (ref 3.5–5.0)
Alkaline Phosphatase: 87 U/L (ref 38–126)
Anion gap: 8 (ref 5–15)
BUN: 12 mg/dL (ref 8–23)
CO2: 24 mmol/L (ref 22–32)
Calcium: 8.7 mg/dL — ABNORMAL LOW (ref 8.9–10.3)
Chloride: 112 mmol/L — ABNORMAL HIGH (ref 98–111)
Creatinine, Ser: 0.62 mg/dL (ref 0.44–1.00)
GFR calc Af Amer: >60 mL/min (ref >60)
GFR calc non Af Amer: >60 mL/min (ref >60)
Glucose, Bld: 98 mg/dL (ref 70–99)
Potassium: 3.9 mmol/L (ref 3.5–5.1)
Sodium: 144 mmol/L (ref 135–145)
Total Bilirubin: 0.4 mg/dL (ref 0.3–1.2)
Total Protein: 6.8 g/dL (ref 6.5–8.1)

## 2019-11-17 LAB — CBC WITH DIFFERENTIAL/PLATELET
Abs Immature Granulocytes: 0.02 10*3/uL (ref 0.00–0.07)
Basophils Absolute: 0 10*3/uL (ref 0.0–0.1)
Basophils Relative: 0 %
Eosinophils Absolute: 0.3 10*3/uL (ref 0.0–0.5)
Eosinophils Relative: 4 %
HCT: 32.8 % — ABNORMAL LOW (ref 36.0–46.0)
Hemoglobin: 9.8 g/dL — ABNORMAL LOW (ref 12.0–15.0)
Immature Granulocytes: 0 %
Lymphocytes Relative: 26 %
Lymphs Abs: 1.7 10*3/uL (ref 0.7–4.0)
MCH: 28.1 pg (ref 26.0–34.0)
MCHC: 29.9 g/dL — ABNORMAL LOW (ref 30.0–36.0)
MCV: 94 fL (ref 80.0–100.0)
Monocytes Absolute: 0.5 10*3/uL (ref 0.1–1.0)
Monocytes Relative: 7 %
Neutro Abs: 4.2 10*3/uL (ref 1.7–7.7)
Neutrophils Relative %: 63 %
Platelets: 249 10*3/uL (ref 150–400)
RBC: 3.49 MIL/uL — ABNORMAL LOW (ref 3.87–5.11)
RDW: 16.9 % — ABNORMAL HIGH (ref 11.5–15.5)
WBC: 6.7 10*3/uL (ref 4.0–10.5)
nRBC: 0 % (ref 0.0–0.2)

## 2019-11-17 LAB — LACTIC ACID, PLASMA: Lactic Acid, Venous: 0.8 mmol/L (ref 0.5–1.9)

## 2019-11-17 MED ORDER — SODIUM CHLORIDE 0.9% FLUSH
10.0000 mL | INTRAVENOUS | Status: DC | PRN
  Administered 2019-11-19: 10 mL

## 2019-11-17 MED ORDER — SODIUM CHLORIDE 0.9% FLUSH
10.0000 mL | Freq: Two times a day (BID) | INTRAVENOUS | Status: DC
  Administered 2019-11-17: 20 mL
  Administered 2019-11-20 – 2019-11-25 (×5): 10 mL

## 2019-11-17 MED ORDER — CLINDAMYCIN PHOSPHATE 900 MG/50ML IV SOLN
900.0000 mg | Freq: Once | INTRAVENOUS | Status: AC
  Administered 2019-11-17: 900 mg via INTRAVENOUS
  Filled 2019-11-17: qty 50

## 2019-11-17 MED ORDER — MORPHINE SULFATE (PF) 4 MG/ML IV SOLN
4.0000 mg | Freq: Once | INTRAVENOUS | Status: AC
  Administered 2019-11-17: 4 mg via INTRAVENOUS
  Filled 2019-11-17: qty 1

## 2019-11-17 NOTE — ED Triage Notes (Signed)
Surgery to left foot 2/15 and started having intractable pain today. Swollen, open, stitches, 2 incsions.

## 2019-11-17 NOTE — ED Notes (Signed)
This writer attempted to draw labs but was unsuccessful

## 2019-11-17 NOTE — ED Notes (Signed)
Attempted to insert iv in right wrist, unsuccessful, vein blew. Patient stated "they usually have to get the ultrasound machine. Please don't stick me again."

## 2019-11-17 NOTE — ED Provider Notes (Signed)
[facility] DEPT Provider Note   CSN: [id number] Arrival date & time: 11/17/19  2014     History Chief Complaint  Patient presents with  . Foot Pain    Karen Williams is a 69 y.o. female.  Surgery to remove bunion from left foot a month ago, worsening pain and swelling and discharge over last few days. Denies trauma. Hx of diabetes.  The history is provided by the patient.  Foot Pain This is a new problem. The current episode started 2 days ago. The problem occurs constantly. The problem has not changed since onset.Pertinent negatives include no chest pain, no abdominal pain, no headaches and no shortness of breath. Nothing aggravates the symptoms. Nothing relieves the symptoms. She has tried nothing for the symptoms. The treatment provided no relief.       Past Medical History:  Diagnosis Date  . Diabetes ([place])   . Lupus ([place])   . Seizures ([place])   . Vertigo     There are no problems to display for this patient.     OB History   No obstetric history on file.     No family history on file.  Social History   Tobacco Use  . Smoking status: Former Smoker    Types: Cigarettes    Quit date: 09/02/1983    Years since quitting: 36.2  . Smokeless tobacco: Never Used  Substance Use Topics  . Alcohol use: Never  . Drug use: Never    Home Medications Prior to Admission medications   Medication Sig Start Date End Date Taking? Authorizing Provider  albuterol (VENTOLIN HFA) 108 (90 Base) MCG/ACT inhaler Inhale 2 puffs into the lungs every 6 (six) hours as needed for wheezing or shortness of breath.   Yes [provider]  amitriptyline (ELAVIL) 25 MG tablet Take 25 mg by mouth at bedtime.   Yes [provider]  clonazePAM (KLONOPIN) 0.5 MG tablet Take 0.5 mg by mouth 2 (two) times daily as needed for anxiety.   Yes [provider]  cyclobenzaprine (FLEXERIL) 10 MG tablet Take 10 mg by mouth 3 (three) times daily  as needed for muscle spasms.   Yes [provider]  ferrous sulfate 325 (65 FE) MG tablet Take 325 mg by mouth every evening.   Yes [provider]  lamoTRIgine (LAMICTAL) 100 MG tablet Take 100 mg by mouth 2 (two) times daily.   Yes [provider]  meclizine (ANTIVERT) 25 MG tablet Take 25 mg by mouth 3 (three) times daily as needed for dizziness.   Yes [provider]  nitroGLYCERIN (NITROSTAT) 0.4 MG SL tablet Place 0.4 mg under the tongue every 5 (five) minutes as needed for chest pain.   Yes [provider]  Oxycodone HCl 10 MG TABS Take 10 mg by mouth every 6 (six) hours as needed (pain).   Yes [provider]  simvastatin (ZOCOR) 40 MG tablet Take 40 mg by mouth daily.   Yes [provider]    Allergies    Tape and Tylenol [acetaminophen]  Review of Systems   Review of Systems  Constitutional: Negative for chills and fever.  HENT: Negative for ear pain and sore throat.   Eyes: Negative for pain and visual disturbance.  Respiratory: Negative for cough and shortness of breath.   Cardiovascular: Negative for chest pain and palpitations.  Gastrointestinal: Negative for abdominal pain and vomiting.  Genitourinary: Negative for dysuria and hematuria.  Musculoskeletal: Positive for arthralgias and gait  problem. Negative for back pain.  Skin: Positive for color change and wound. Negative for rash.  Neurological: Negative for seizures, syncope and headaches.  All other systems reviewed and are negative.   Physical Exam Updated Vital Signs  ED Triage Vitals  Enc Vitals Group     BP 11/17/19 2026 (!) 144/79     Pulse Rate 11/17/19 2026 88     Resp --      Temp 11/17/19 2026 98.5 F (36.9 C)     Temp Source 11/17/19 2026 Oral     SpO2 11/17/19 2026 99 %     Weight 11/17/19 2029 152 lb (68.9 kg)     Height 11/17/19 2029 5\' 7"  (1.702 m)     Head Circumference --      Peak Flow --      Pain Score 11/17/19 2028 10      Pain Loc --      Pain Edu? --      Excl. in GC? --     Physical Exam Vitals and nursing note reviewed.  Constitutional:      General: She is not in acute distress.    Appearance: She is well-developed. She is not ill-appearing.  HENT:     Head: Normocephalic and atraumatic.     Nose: Nose normal.     Mouth/Throat:     Mouth: Mucous membranes are moist.  Eyes:     Extraocular Movements: Extraocular movements intact.     Conjunctiva/sclera: Conjunctivae normal.     Pupils: Pupils are equal, round, and reactive to light.  Cardiovascular:     Rate and Rhythm: Normal rate and regular rhythm.     Pulses: Normal pulses.     Heart sounds: Normal heart sounds. No murmur.  Pulmonary:     Effort: Pulmonary effort is normal. No respiratory distress.     Breath sounds: Normal breath sounds.  Abdominal:     Palpations: Abdomen is soft.     Tenderness: There is no abdominal tenderness.  Musculoskeletal:        General: Swelling (left forefoot) present. Normal range of motion.     Cervical back: Neck supple.  Skin:    General: Skin is warm and dry.     Capillary Refill: Capillary refill takes less than 2 seconds.     Findings: Erythema present.     Comments: Two areas of wound dehiscence on top of left foot with warmth and fluctuance and dried pus  Neurological:     Mental Status: She is alert.     ED Results / Procedures / Treatments   Labs (all labs ordered are listed, but only abnormal results are displayed) Labs Reviewed  CBC WITH DIFFERENTIAL/PLATELET - Abnormal; Notable for the following components:      Result Value   RBC 3.49 (*)    Hemoglobin 9.8 (*)    HCT 32.8 (*)    MCHC 29.9 (*)    RDW 16.9 (*)    All other components within normal limits  CULTURE, BLOOD (ROUTINE X 2)  CULTURE, BLOOD (ROUTINE X 2)  SARS CORONAVIRUS 2 (TAT 6-24 HRS)  COMPREHENSIVE METABOLIC PANEL  LACTIC ACID, PLASMA  LACTIC ACID, PLASMA  SEDIMENTATION RATE  C-REACTIVE PROTEIN     EKG None  Radiology DG Foot Complete Left  Result Date: 11/17/2019 CLINICAL DATA:  69 year old female with recent left foot surgery on 10/17/2019. Patient presenting with pain and swelling. EXAM: LEFT FOOT - COMPLETE 3+ VIEW COMPARISON:  None. FINDINGS:  Mildly displaced fractures of the distal first and second metatarsals with fixation pins. The pins appear intact. There is some bridging callus formation without complete healing at this time. Evaluation is limited as no prior images is available. No other acute fracture identified. The bones are osteopenic. There is no dislocation. There is soft tissue swelling of the forefoot. No soft tissue gas. IMPRESSION: 1. Fixation pins of the distal first and second metatarsal fractures. 2. Soft tissue swelling the foot. Clinical correlation is recommended to evaluate for infection. Electronically Signed   By: Elgie Collard M.D.   On: 11/17/2019 21:32    Procedures Procedures (including critical care time)  Medications Ordered in ED Medications  clindamycin (CLEOCIN) IVPB 900 mg (900 mg Intravenous New Bag/Given 11/17/19 2339)  sodium chloride flush (NS) 0.9 % injection 10-40 mL (20 mLs Intracatheter Given 11/17/19 2339)  sodium chloride flush (NS) 0.9 % injection 10-40 mL (has no administration in time range)  morphine 4 MG/ML injection 4 mg (4 mg Intravenous Given 11/17/19 2242)    ED Course  I have reviewed the triage vital signs and the nursing notes.  Pertinent labs & imaging results that were available during my care of the patient were reviewed by me and considered in my medical decision making (see chart for details).    MDM Rules/Calculators/A&P                      Karen Williams is a 69 year old female with history of seizures, lupus, diabetes who presents the ED with left foot pain.  Patient recently had bunion removed from left foot about a month ago in [place].  She is visiting family right now.  Patient with increasing pain  over the last several days with redness and swelling and some drainage.  She appears to have infection of the left foot at the surgical site.  She is not on antibiotics.  She followed up with foot doctor several weeks ago.  She has good pulses in the left foot.  Overall it appears that she has cellulitis at this spot would benefit from IV antibiotics and observation.  However, patient has no fever, no chills.  No significant leukocytosis.  X-ray of the left foot shows fixation pins at first and second metatarsal fractures.  But no acute fractures.  There is soft tissue swelling of the foot which is likely infectious.  There is no signs to suggest osteomyelitis.  Patient signed out to oncoming ED staff with patient pending lab work.  Anticipate likely admission for further IV antibiotics and observation.  This chart was dictated using voice recognition software.  Despite best efforts to proofread,  errors can occur which can change the documentation meaning.    Final Clinical Impression(s) / ED Diagnoses Final diagnoses:  Cellulitis of left lower extremity    Rx / DC Orders ED Discharge Orders    None       Virgina Norfolk, DO 11/17/19 2348

## 2019-11-18 ENCOUNTER — Encounter (HOSPITAL_COMMUNITY): Payer: Self-pay | Admitting: Emergency Medicine

## 2019-11-18 DIAGNOSIS — B9561 Methicillin susceptible Staphylococcus aureus infection as the cause of diseases classified elsewhere: Secondary | ICD-10-CM | POA: Diagnosis present

## 2019-11-18 DIAGNOSIS — Z87891 Personal history of nicotine dependence: Secondary | ICD-10-CM | POA: Diagnosis not present

## 2019-11-18 DIAGNOSIS — Y831 Surgical operation with implant of artificial internal device as the cause of abnormal reaction of the patient, or of later complication, without mention of misadventure at the time of the procedure: Secondary | ICD-10-CM | POA: Diagnosis present

## 2019-11-18 DIAGNOSIS — E119 Type 2 diabetes mellitus without complications: Secondary | ICD-10-CM | POA: Diagnosis not present

## 2019-11-18 DIAGNOSIS — F419 Anxiety disorder, unspecified: Secondary | ICD-10-CM | POA: Diagnosis not present

## 2019-11-18 DIAGNOSIS — R569 Unspecified convulsions: Secondary | ICD-10-CM | POA: Diagnosis not present

## 2019-11-18 DIAGNOSIS — Y792 Prosthetic and other implants, materials and accessory orthopedic devices associated with adverse incidents: Secondary | ICD-10-CM | POA: Diagnosis present

## 2019-11-18 DIAGNOSIS — D509 Iron deficiency anemia, unspecified: Secondary | ICD-10-CM | POA: Diagnosis present

## 2019-11-18 DIAGNOSIS — M25562 Pain in left knee: Secondary | ICD-10-CM | POA: Diagnosis not present

## 2019-11-18 DIAGNOSIS — G44209 Tension-type headache, unspecified, not intractable: Secondary | ICD-10-CM | POA: Diagnosis not present

## 2019-11-18 DIAGNOSIS — Z20822 Contact with and (suspected) exposure to covid-19: Secondary | ICD-10-CM | POA: Diagnosis present

## 2019-11-18 DIAGNOSIS — T8131XA Disruption of external operation (surgical) wound, not elsewhere classified, initial encounter: Secondary | ICD-10-CM | POA: Diagnosis present

## 2019-11-18 DIAGNOSIS — T8469XA Infection and inflammatory reaction due to internal fixation device of other site, initial encounter: Secondary | ICD-10-CM | POA: Diagnosis present

## 2019-11-18 DIAGNOSIS — Z90710 Acquired absence of both cervix and uterus: Secondary | ICD-10-CM | POA: Diagnosis not present

## 2019-11-18 DIAGNOSIS — M329 Systemic lupus erythematosus, unspecified: Secondary | ICD-10-CM | POA: Diagnosis present

## 2019-11-18 DIAGNOSIS — E7800 Pure hypercholesterolemia, unspecified: Secondary | ICD-10-CM | POA: Diagnosis not present

## 2019-11-18 DIAGNOSIS — Z886 Allergy status to analgesic agent status: Secondary | ICD-10-CM | POA: Diagnosis not present

## 2019-11-18 DIAGNOSIS — Z79899 Other long term (current) drug therapy: Secondary | ICD-10-CM | POA: Diagnosis not present

## 2019-11-18 DIAGNOSIS — M79672 Pain in left foot: Secondary | ICD-10-CM | POA: Diagnosis present

## 2019-11-18 DIAGNOSIS — E11649 Type 2 diabetes mellitus with hypoglycemia without coma: Secondary | ICD-10-CM | POA: Diagnosis not present

## 2019-11-18 DIAGNOSIS — Z91048 Other nonmedicinal substance allergy status: Secondary | ICD-10-CM | POA: Diagnosis not present

## 2019-11-18 DIAGNOSIS — E785 Hyperlipidemia, unspecified: Secondary | ICD-10-CM | POA: Diagnosis present

## 2019-11-18 DIAGNOSIS — L03116 Cellulitis of left lower limb: Secondary | ICD-10-CM | POA: Diagnosis present

## 2019-11-18 DIAGNOSIS — G40909 Epilepsy, unspecified, not intractable, without status epilepticus: Secondary | ICD-10-CM | POA: Diagnosis present

## 2019-11-18 DIAGNOSIS — Z79891 Long term (current) use of opiate analgesic: Secondary | ICD-10-CM | POA: Diagnosis not present

## 2019-11-18 DIAGNOSIS — F411 Generalized anxiety disorder: Secondary | ICD-10-CM | POA: Diagnosis present

## 2019-11-18 DIAGNOSIS — T847XXA Infection and inflammatory reaction due to other internal orthopedic prosthetic devices, implants and grafts, initial encounter: Secondary | ICD-10-CM | POA: Diagnosis not present

## 2019-11-18 LAB — BASIC METABOLIC PANEL
Anion gap: 8 (ref 5–15)
BUN: 10 mg/dL (ref 8–23)
CO2: 23 mmol/L (ref 22–32)
Calcium: 8.4 mg/dL — ABNORMAL LOW (ref 8.9–10.3)
Chloride: 109 mmol/L (ref 98–111)
Creatinine, Ser: 0.67 mg/dL (ref 0.44–1.00)
GFR calc Af Amer: >60 mL/min (ref >60)
GFR calc non Af Amer: >60 mL/min (ref >60)
Glucose, Bld: 111 mg/dL — ABNORMAL HIGH (ref 70–99)
Potassium: 3.4 mmol/L — ABNORMAL LOW (ref 3.5–5.1)
Sodium: 140 mmol/L (ref 135–145)

## 2019-11-18 LAB — HIV ANTIBODY (ROUTINE TESTING W REFLEX): HIV Screen 4th Generation wRfx: NONREACTIVE

## 2019-11-18 LAB — CBC WITH DIFFERENTIAL/PLATELET
Abs Immature Granulocytes: 0.02 10*3/uL (ref 0.00–0.07)
Basophils Absolute: 0.1 10*3/uL (ref 0.0–0.1)
Basophils Relative: 1 %
Eosinophils Absolute: 0.3 10*3/uL (ref 0.0–0.5)
Eosinophils Relative: 4 %
HCT: 32.6 % — ABNORMAL LOW (ref 36.0–46.0)
Hemoglobin: 9.8 g/dL — ABNORMAL LOW (ref 12.0–15.0)
Immature Granulocytes: 0 %
Lymphocytes Relative: 22 %
Lymphs Abs: 1.5 10*3/uL (ref 0.7–4.0)
MCH: 28.5 pg (ref 26.0–34.0)
MCHC: 30.1 g/dL (ref 30.0–36.0)
MCV: 94.8 fL (ref 80.0–100.0)
Monocytes Absolute: 0.6 10*3/uL (ref 0.1–1.0)
Monocytes Relative: 9 %
Neutro Abs: 4.2 10*3/uL (ref 1.7–7.7)
Neutrophils Relative %: 64 %
Platelets: 239 10*3/uL (ref 150–400)
RBC: 3.44 MIL/uL — ABNORMAL LOW (ref 3.87–5.11)
RDW: 17 % — ABNORMAL HIGH (ref 11.5–15.5)
WBC: 6.7 10*3/uL (ref 4.0–10.5)
nRBC: 0 % (ref 0.0–0.2)

## 2019-11-18 LAB — GLUCOSE, CAPILLARY
Glucose-Capillary: 57 mg/dL — ABNORMAL LOW (ref 70–99)
Glucose-Capillary: 95 mg/dL (ref 70–99)
Glucose-Capillary: 59 mg/dL — ABNORMAL LOW (ref 70–99)
Glucose-Capillary: 70 mg/dL (ref 70–99)
Glucose-Capillary: 107 mg/dL — ABNORMAL HIGH (ref 70–99)
Glucose-Capillary: 92 mg/dL (ref 70–99)
Glucose-Capillary: 155 mg/dL — ABNORMAL HIGH (ref 70–99)

## 2019-11-18 LAB — SARS CORONAVIRUS 2 (TAT 6-24 HRS): SARS Coronavirus 2: NEGATIVE

## 2019-11-18 LAB — C-REACTIVE PROTEIN: CRP: 0.5 mg/dL (ref <1.0)

## 2019-11-18 LAB — HEMOGLOBIN A1C
Hgb A1c MFr Bld: 6.8 % — ABNORMAL HIGH (ref 4.8–5.6)
Mean Plasma Glucose: 148.46 mg/dL

## 2019-11-18 LAB — MAGNESIUM: Magnesium: 1.8 mg/dL (ref 1.7–2.4)

## 2019-11-18 LAB — SEDIMENTATION RATE: Sed Rate: 28 mm/hr — ABNORMAL HIGH (ref 0–22)

## 2019-11-18 MED ORDER — SODIUM CHLORIDE 0.9% FLUSH
3.0000 mL | Freq: Two times a day (BID) | INTRAVENOUS | Status: DC
  Administered 2019-11-20 – 2019-11-25 (×6): 3 mL via INTRAVENOUS

## 2019-11-18 MED ORDER — BUTALBITAL-APAP-CAFFEINE 50-325-40 MG PO TABS
2.0000 | ORAL_TABLET | Freq: Once | ORAL | Status: AC
  Administered 2019-11-18: 2 via ORAL
  Filled 2019-11-18: qty 2

## 2019-11-18 MED ORDER — ONDANSETRON HCL 4 MG/2ML IJ SOLN
4.0000 mg | Freq: Four times a day (QID) | INTRAMUSCULAR | Status: DC | PRN
  Administered 2019-11-18 – 2019-11-25 (×6): 4 mg via INTRAVENOUS
  Filled 2019-11-18 (×6): qty 2

## 2019-11-18 MED ORDER — INSULIN ASPART 100 UNIT/ML ~~LOC~~ SOLN
0.0000 [IU] | Freq: Three times a day (TID) | SUBCUTANEOUS | Status: DC
  Administered 2019-11-19: 5 [IU] via SUBCUTANEOUS
  Administered 2019-11-20: 2 [IU] via SUBCUTANEOUS
  Administered 2019-11-20: 7 [IU] via SUBCUTANEOUS
  Administered 2019-11-21 – 2019-11-23 (×2): 1 [IU] via SUBCUTANEOUS
  Administered 2019-11-23: 2 [IU] via SUBCUTANEOUS
  Administered 2019-11-24: 1 [IU] via SUBCUTANEOUS
  Administered 2019-11-25: 15:00:00 2 [IU] via SUBCUTANEOUS
  Filled 2019-11-18: qty 0.09

## 2019-11-18 MED ORDER — CLINDAMYCIN PHOSPHATE 600 MG/50ML IV SOLN
600.0000 mg | Freq: Three times a day (TID) | INTRAVENOUS | Status: DC
  Administered 2019-11-18 – 2019-11-25 (×21): 600 mg via INTRAVENOUS
  Filled 2019-11-18 (×24): qty 50

## 2019-11-18 MED ORDER — ENOXAPARIN SODIUM 40 MG/0.4ML ~~LOC~~ SOLN
40.0000 mg | SUBCUTANEOUS | Status: DC
  Administered 2019-11-18 – 2019-11-25 (×7): 40 mg via SUBCUTANEOUS
  Filled 2019-11-18 (×7): qty 0.4

## 2019-11-18 MED ORDER — CLONAZEPAM 0.5 MG PO TABS
0.5000 mg | ORAL_TABLET | Freq: Two times a day (BID) | ORAL | Status: DC | PRN
  Administered 2019-11-19 – 2019-11-22 (×5): 0.5 mg via ORAL
  Filled 2019-11-18 (×6): qty 1

## 2019-11-18 MED ORDER — SIMVASTATIN 20 MG PO TABS
40.0000 mg | ORAL_TABLET | Freq: Every day | ORAL | Status: DC
  Administered 2019-11-18 – 2019-11-24 (×7): 40 mg via ORAL
  Filled 2019-11-18 (×2): qty 1
  Filled 2019-11-18: qty 2
  Filled 2019-11-18: qty 1
  Filled 2019-11-18 (×2): qty 2
  Filled 2019-11-18: qty 1

## 2019-11-18 MED ORDER — OXYCODONE HCL 5 MG PO TABS
5.0000 mg | ORAL_TABLET | ORAL | Status: DC | PRN
  Administered 2019-11-18: 5 mg via ORAL
  Administered 2019-11-18 – 2019-11-19 (×4): 10 mg via ORAL
  Administered 2019-11-19: 5 mg via ORAL
  Administered 2019-11-19: 10 mg via ORAL
  Administered 2019-11-19: 5 mg via ORAL
  Administered 2019-11-20 – 2019-11-25 (×14): 10 mg via ORAL
  Filled 2019-11-18 (×8): qty 2
  Filled 2019-11-18: qty 1
  Filled 2019-11-18 (×13): qty 2
  Filled 2019-11-18: qty 1

## 2019-11-18 MED ORDER — DEXTROSE 50 % IV SOLN
INTRAVENOUS | Status: AC
  Filled 2019-11-18: qty 50

## 2019-11-18 MED ORDER — ALBUTEROL SULFATE (2.5 MG/3ML) 0.083% IN NEBU: 2.5000 mg | INHALATION_SOLUTION | Freq: Four times a day (QID) | RESPIRATORY_TRACT | Status: DC | PRN

## 2019-11-18 MED ORDER — LAMOTRIGINE 100 MG PO TABS
100.0000 mg | ORAL_TABLET | Freq: Two times a day (BID) | ORAL | Status: DC
  Administered 2019-11-18 – 2019-11-25 (×15): 100 mg via ORAL
  Filled 2019-11-18 (×15): qty 1

## 2019-11-18 MED ORDER — MORPHINE SULFATE (PF) 4 MG/ML IV SOLN
4.0000 mg | Freq: Once | INTRAVENOUS | Status: AC
  Administered 2019-11-18: 4 mg via INTRAVENOUS
  Filled 2019-11-18: qty 1

## 2019-11-18 MED ORDER — HYDRALAZINE HCL 20 MG/ML IJ SOLN: 10.0000 mg | Freq: Four times a day (QID) | INTRAMUSCULAR | Status: DC | PRN

## 2019-11-18 MED ORDER — AMITRIPTYLINE HCL 25 MG PO TABS
25.0000 mg | ORAL_TABLET | Freq: Every day | ORAL | Status: DC
  Administered 2019-11-18 – 2019-11-24 (×7): 25 mg via ORAL
  Filled 2019-11-18 (×7): qty 1

## 2019-11-18 MED ORDER — FERROUS SULFATE 325 (65 FE) MG PO TABS
325.0000 mg | ORAL_TABLET | Freq: Every evening | ORAL | Status: DC
  Administered 2019-11-18 – 2019-11-24 (×7): 325 mg via ORAL
  Filled 2019-11-18 (×7): qty 1

## 2019-11-18 MED ORDER — MECLIZINE HCL 25 MG PO TABS
25.0000 mg | ORAL_TABLET | Freq: Three times a day (TID) | ORAL | Status: DC | PRN
  Filled 2019-11-18: qty 1

## 2019-11-18 MED ORDER — MORPHINE SULFATE (PF) 4 MG/ML IV SOLN
4.0000 mg | INTRAVENOUS | Status: DC | PRN
  Administered 2019-11-18 – 2019-11-23 (×9): 4 mg via INTRAVENOUS
  Filled 2019-11-18 (×9): qty 1

## 2019-11-18 MED ORDER — MORPHINE SULFATE (PF) 4 MG/ML IV SOLN
4.0000 mg | INTRAVENOUS | Status: DC | PRN
  Administered 2019-11-18: 4 mg via INTRAVENOUS
  Filled 2019-11-18: qty 1

## 2019-11-18 NOTE — H&P (Signed)
History and Physical    PLEASE NOTE THAT DRAGON DICTATION SOFTWARE WAS USED IN THE CONSTRUCTION OF THIS NOTE.   Karen Williams QDI:[id number] DOB: 01/30/1951 DOA: 11/17/2019  PCP: Patient, No Pcp Per Patient coming from: home   I have personally briefly reviewed patient's old medical records in [place]  Chief Complaint: Left foot pain  HPI: Karen Williams is a 69 y.o. female with medical history significant for type 2 diabetes mellitus managed via lifestyle modifications, seizure disorder, anxiety, chronic iron deficiency anemia, recent left foot bunionectomy, who is admitted to [facility] on 11/17/2019 with cellulitis of the left lower extremity after presenting from home to [facility] Emergency Department complaining of left foot pain.   The patient reports that she underwent bunionectomy of the left foot via a podiatrist in [place], [place] on 10/19/2019, although she does not recall the name of the specific podiatrist at this time.  The patient, who is currently visiting family in [place], conveys that she felt that she was recovering well during the first 3 weeks status post bunionectomy, before developing progressive discomfort over the dorsal surface of the left foot over the last 2 to 3 days.  She reports that this is been associated with increased left foot swelling, erythema, increased warmth to touch, as well as purulent discharge from the dorsal surface of the left foot.  She has also noted over the last day progression of the left foot erythema, extending proximally up the left lower extremity before terminating just distal to the left knee.  Denies any trauma following recent bunionectomy.  Denies rash in any other location.  Denies any associated subjective fever, chills, rigors, or generalized myalgias.  The patient reports that the progressive nature of the discomfort associated with the dorsal aspect of the left foot has become sufficient that  it is impairing her ability to ambulate on the basis of suboptimal pain control.  She denies any paresthesias or numbness associated with the left lower extremity.  Denies any recent headache, neck stiffness, rhinitis, rhinorrhea, sore throat, sob, cough, nausea, vomiting, abdominal pain, diarrhea. No known COVID-19 exposures. Denies dysuria, gross hematuria, or change in urinary urgency/frequency.  Denies any recent calf tenderness.  She also denies any chest pain, diaphoresis, palpitations, or hemoptysis.  Medical history is notable for a history of type 2 diabetes mellitus, for which the patient reports that she currently manages via lifestyle modifications.     ED Course:  Vital signs in the ED were notable for the following: Temperature max 98.5; heart rate 81-91; blood pressure 132/92-156/83; respiratory rate 16-20, oxygen saturation 95 to 100% on room air.  Labs were notable for the following: CMP notable for sodium 144, creatinine 0.62, glucose 98.  CBC notable for white blood cell count of 6700, hemoglobin 9.8, without prior hemoglobin data points available to establish baseline.  Lactic acid 0.8.  CRP 0.5.  ESR 28.  In anticipation of admission, nasopharyngeal COVID-19 PCR was obtained, with result currently pending.  Blood cultures x2 were collected prior to initiation of any antibiotics.  Plain films of the left foot showed soft tissue swelling of the forefoot consistent with cellulitis, but showed no evidence of soft tissue gas or evidence of osteomyelitis.  While in the ED, the following were administered: Clindamycin 900 mg IV x1.  Morphine 4 mg IV x1.    Review of Systems: As per HPI otherwise 10 point review of systems negative.   Past Medical History:  Diagnosis Date  .  Diabetes ([place])   . Lupus ([place])   . Seizures ([place])   . Vertigo     Past Surgical History:  Procedure Laterality Date  . ABDOMINAL HYSTERECTOMY      Social History:  reports that she quit smoking  about 36 years ago. Her smoking use included cigarettes. She has never used smokeless tobacco. She reports that she does not drink alcohol or use drugs.   Allergies  Allergen Reactions  . Tape Itching  . Tylenol [Acetaminophen] Rash    History reviewed. No pertinent family history.   Prior to Admission medications   Medication Sig Start Date End Date Taking? Authorizing Provider  albuterol (VENTOLIN HFA) 108 (90 Base) MCG/ACT inhaler Inhale 2 puffs into the lungs every 6 (six) hours as needed for wheezing or shortness of breath.   Yes [provider]  amitriptyline (ELAVIL) 25 MG tablet Take 25 mg by mouth at bedtime.   Yes [provider]  clonazePAM (KLONOPIN) 0.5 MG tablet Take 0.5 mg by mouth 2 (two) times daily as needed for anxiety.   Yes [provider]  cyclobenzaprine (FLEXERIL) 10 MG tablet Take 10 mg by mouth 3 (three) times daily as needed for muscle spasms.   Yes [provider]  ferrous sulfate 325 (65 FE) MG tablet Take 325 mg by mouth every evening.   Yes [provider]  lamoTRIgine (LAMICTAL) 100 MG tablet Take 100 mg by mouth 2 (two) times daily.   Yes [provider]  meclizine (ANTIVERT) 25 MG tablet Take 25 mg by mouth 3 (three) times daily as needed for dizziness.   Yes [provider]  nitroGLYCERIN (NITROSTAT) 0.4 MG SL tablet Place 0.4 mg under the tongue every 5 (five) minutes as needed for chest pain.   Yes [provider]  Oxycodone HCl 10 MG TABS Take 10 mg by mouth every 6 (six) hours as needed (pain).   Yes [provider]  simvastatin (ZOCOR) 40 MG tablet Take 40 mg by mouth daily.   Yes [provider]     Objective    Physical Exam: Vitals:   11/17/19 2230 11/17/19 2300 11/17/19 2330 11/17/19 2345  BP: (!) 146/80 (!) 158/79 (!) 154/93   Pulse:    84  Temp:      TempSrc:      SpO2:    94%  Weight:      Height:        General: appears to be stated age;  alert, oriented Skin: warm; erythema, swelling, and increased warmth to touch associated with the dorsal aspect of the left foot along with evidence of hyperpigmentation as well as 2 areas of wound dehiscence over the dorsal aspect of the left foot.  Head:  AT/[place] Eyes:  PEARL b/l, EOMI Mouth:  Oral mucosa membranes appear moist, normal dentition Neck: supple; trachea midline Heart:  RRR; did not appreciate any M/R/G Lungs: CTAB, did not appreciate any wheezes, rales, or rhonchi Abdomen: + BS; soft, ND, NT Vascular: 2+ pedal pulses b/l; 2+ radial pulses b/l Extremities: Erythema, increased swelling, and increased warmth to the touch associated with the dorsal aspect of the left foot, as further described above; no muscle wasting Neuro: strength and sensation intact in upper and lower extremities b/l    Labs on Admission: I have personally reviewed following labs and imaging studies  CBC: Recent Labs  Lab 11/17/19 2250  WBC 6.7  NEUTROABS 4.2  HGB 9.8*  HCT 32.8*  MCV 94.0  PLT 397   Basic Metabolic Panel: Recent Labs  Lab 11/17/19 2250  NA 144  K 3.9  CL 112*  CO2 24  GLUCOSE 98  BUN 12  CREATININE 0.62  CALCIUM 8.7*   GFR: Estimated Creatinine Clearance: 65.5 mL/min (by C-G formula based on SCr of 0.62 mg/dL). Liver Function Tests: Recent Labs  Lab 11/17/19 2250  AST 17  ALT 14  ALKPHOS 87  BILITOT 0.4  PROT 6.8  ALBUMIN 3.3*   No results for input(s): LIPASE, AMYLASE in the last 168 hours. No results for input(s): AMMONIA in the last 168 hours. Coagulation Profile: No results for input(s): INR, PROTIME in the last 168 hours. Cardiac Enzymes: No results for input(s): CKTOTAL, CKMB, CKMBINDEX, TROPONINI in the last 168 hours. BNP (last 3 results) No results for input(s): PROBNP in the last 8760 hours. HbA1C: No results for input(s): HGBA1C in the last 72 hours. CBG: No results for input(s): GLUCAP in the last 168 hours. Lipid Profile: No results for  input(s): CHOL, HDL, LDLCALC, TRIG, CHOLHDL, LDLDIRECT in the last 72 hours. Thyroid Function Tests: No results for input(s): TSH, T4TOTAL, FREET4, T3FREE, THYROIDAB in the last 72 hours. Anemia Panel: No results for input(s): VITAMINB12, FOLATE, FERRITIN, TIBC, IRON, RETICCTPCT in the last 72 hours. Urine analysis: No results found for: COLORURINE, APPEARANCEUR, LABSPEC, PHURINE, GLUCOSEU, HGBUR, BILIRUBINUR, KETONESUR, PROTEINUR, UROBILINOGEN, NITRITE, LEUKOCYTESUR  Radiological Exams on Admission: DG Foot Complete Left  Result Date: 11/17/2019 CLINICAL DATA:  69 year old female with recent left foot surgery on 10/17/2019. Patient presenting with pain and swelling. EXAM: LEFT FOOT - COMPLETE 3+ VIEW COMPARISON:  None. FINDINGS: Mildly displaced fractures of the distal first and second metatarsals with fixation pins. The pins appear intact. There is some bridging callus formation without complete healing at this time. Evaluation is limited as no prior images is available. No other acute fracture identified. The bones are osteopenic. There is no dislocation. There is soft tissue swelling of the forefoot. No soft tissue gas. IMPRESSION: 1. Fixation pins of the distal first and second metatarsal fractures. 2. Soft tissue swelling the foot. Clinical correlation is recommended to evaluate for infection. Electronically Signed   By: Anner Crete M.D.   On: 11/17/2019 21:32     Assessment/Plan   Karen Williams is a 69 y.o. female with medical history significant for type 2 diabetes mellitus managed via lifestyle modifications, seizure disorder, anxiety, chronic iron deficiency anemia, recent left foot bunionectomy, who is admitted to [facility] on 11/17/2019 with cellulitis of the left lower extremity after presenting from home to [facility] Emergency Department complaining of left foot pain.    Principal Problem:   Cellulitis of foot, left Active Problems:   Left foot pain    Diabetes (HCC)   Seizures ([place])   #) Left foot cellulitis: Diagnosis on the basis of 2 to 3 days of progressive erythema over the dorsal aspect of the left foot associated with increased swelling, increased warmth to touch, tenderness, with plain films of the left foot showing evidence of soft tissue swelling consistent with cellulitis in the absence of evidence of soft tissue gas or evidence of osteomyelitis.  Physical exam reveals no evidence of crepitus, further diminishing the likelihood of underlying necrotizing fasciitis.  Wound dehiscence over the dorsal aspect of the left foot following left foot bunionectomy on 10/19/2019 appears to represent the portal of entry, with  Evidence of purulent drainage increasing likelihood of MRSA.  Left lower extremity appears neurovascularly intact.  Does not meet criteria to be considered septic at this time.  Blood cultures x2 were collected prior to providing dose of IV clindamycin, following which the patient reports resection of erythema that had previously extended proximally above the left ankle and terminating just distal to the left knee.  I had considered changing IV clindamycin to IV vancomycin for maintenance of revision of MRSA coverage.  However, given the significant positive response to single dose of IV clindamycin, I have elected to continue with IV clindamycin as coverage of the patient's left foot cellulitis at this time.   Plan: Continue IV clindamycin.  Monitor for results blood cultures x2 collected in the ED this evening.  Repeat CBC with differential in the morning.  I have placed a nursing communication order requesting that the left lower extremity be elevated to help decrease associated swelling.  Wound care consult placed.  As needed IV morphine.      #) Type 2 diabetes mellitus: The patient reports that this is managed via lifestyle modifications.  Chart review yields no prior hemoglobin A1c value.  Presenting blood sugar per  presenting CMP noted to be 98.  Plan: Accu-Cheks before every meal and at bedtime with associated low-dose sliding scale insulin.  Check A1c.     #) Seizure disorder: On Lamictal as an outpatient.  Continue home Lamictal.     #) Hyperlipidemia: On simvastatin as an outpatient.  Plan: Continue home statin.      #) Chronic iron deficiency anemia: Patient has a documented history of chronic iron deficiency anemia, although I have been unable to locate prior hemoglobin data points in order to establish her baseline range.  She is on chronic oral iron supplementation.  Presenting CBC reflects hemoglobin of 9.8.  No evidence of active bleed at this time.  Plan: Continue home oral iron supplementation.  Repeat CBC in the morning.     #) General anxiety disorder: On as needed clonazepam as an outpatient.  Plan: Continue as needed benzodiazepine.     DVT prophylaxis: Lovenox 40 mg subcu daily Code Status: Full code Family Communication: none Disposition Plan: Per Rounding Team Consults called: none  Admission status: Inpatient; MedSurg.    PLEASE NOTE THAT DRAGON DICTATION SOFTWARE WAS USED IN THE CONSTRUCTION OF THIS NOTE.   [place] [facility] Pager [phone] From [place].   Otherwise, please contact night-coverage  [email or web address] Password [facility]  11/18/2019, 12:07 AM

## 2019-11-18 NOTE — Progress Notes (Signed)
Hypoglycemic Event  CBG: 57  Treatment: one glass of orange juice  Symptoms: no symptoms  Follow-up CBG: Time:0820 CBG Result:95  Possible Reasons for Event: no oral intake overnight of food or fluids Comments/MD notified:MD notified    Marijean Bravo

## 2019-11-18 NOTE — Progress Notes (Signed)
PROGRESS NOTE    Javier Mamone  CHY:[id number] DOB: Jun 05, 1951 DOA: 11/17/2019 PCP: Patient, No Pcp Per   Brief Narrative: Deb Loudin is a 69 y.o. female with medical history significant for type 2 diabetes mellitus managed via lifestyle modifications, seizure disorder, anxiety, chronic iron deficiency anemia, recent left foot bunionectomy. Patient presented secondary to left foot pain and found to have worsening cellulitis.   Assessment & Plan:   Principal Problem:   Cellulitis of foot, left Active Problems:   Left foot pain   Diabetes (HCC)   Seizures (HCC)   Left foot cellulitis Post op infection from recent bunionectomy. Purulent drainage noted. Concern for abscess but also would be concerning for osteomyelitis. Started on empiric Clindamycin on admission. Patient with recurrent emesis which she has issues with as an outpatient as well -Continue Clindamycin IV -MRI of left foot  Diabetes mellitus, type 2 Hemoglobin A1C of 6.8%. Diet controlled. Patient with hypoglycemia this admission -SSI qAC  Iron deficiency anemia Patient is on iron supplementation. Will hold for now.  Hyperlipidemia -Continue simvastatin  Seizure disorder -continue Lamictal  Headache Appears to be tension type. Possibly related to lack of caffeine -Fioricet 2 tabs   DVT prophylaxis: Lovenox Code Status:   Code Status: Full Code Family Communication: Daughter at bedside Disposition Plan: Discharge pending continued workup/management of left foot cellulitis/infection   Consultants:   None  Procedures:   None  Antimicrobials:  Clindamycin    Subjective: Significant left foot pain.   Objective: Vitals:   11/18/19 0430 11/18/19 0500 11/18/19 0530 11/18/19 0619  BP: (!) 155/77 (!) 141/77 (!) 169/90 (!) 160/85  Pulse: 81 80 82 75  Resp:    20  Temp:   98 F (36.7 C) 98.2 F (36.8 C)  TempSrc:   Oral Oral  SpO2: 91% 93% 97% 98%  Weight:      Height:         Intake/Output Summary (Last 24 hours) at 11/18/2019 1310 Last data filed at 11/18/2019 1229 Gross per 24 hour  Intake 1595.62 ml  Output --  Net 1595.62 ml   Filed Weights   11/17/19 2029  Weight: 68.9 kg    Examination:  General exam: Appears calm and comfortable Respiratory system: Clear to auscultation. Respiratory effort normal. Cardiovascular system: S1 & S2 heard, RRR. No murmurs, rubs, gallops or clicks. Gastrointestinal system: Abdomen is nondistended, soft and nontender. No organomegaly or masses felt. Normal bowel sounds heard. Central nervous system: Alert and oriented. No focal neurological deficits. Extremities: Dorsum left left foot is significant swollen with some erythema and significant induration. Two areas appear to be wound dehiscence from recent surgery and are draining purulent material. There is induration noted on the dorsal surface between the first and second digits. There is also significant left medial swelling in addition to left planter surface swelling medially. The foot is tender. No calf tenderness Skin: No cyanosis. No rashes Psychiatry: Judgement and insight appear normal. Mood & affect appropriate.        Data Reviewed: I have personally reviewed following labs and imaging studies  CBC: Recent Labs  Lab 11/17/19 2250 11/18/19 0530  WBC 6.7 6.7  NEUTROABS 4.2 4.2  HGB 9.8* 9.8*  HCT 32.8* 32.6*  MCV 94.0 94.8  PLT 249 239   Basic Metabolic Panel: Recent Labs  Lab 11/17/19 2250 11/18/19 0530  NA 144 140  K 3.9 3.4*  CL 112* 109  CO2 24 23  GLUCOSE 98 111*  BUN 12 10  CREATININE 0.62 0.67  CALCIUM 8.7* 8.4*  MG  --  1.8   GFR: Estimated Creatinine Clearance: 65.5 mL/min (by C-G formula based on SCr of 0.67 mg/dL). Liver Function Tests: Recent Labs  Lab 11/17/19 2250  AST 17  ALT 14  ALKPHOS 87  BILITOT 0.4  PROT 6.8  ALBUMIN 3.3*   No results for input(s): LIPASE, AMYLASE in the last 168 hours. No results for  input(s): AMMONIA in the last 168 hours. Coagulation Profile: No results for input(s): INR, PROTIME in the last 168 hours. Cardiac Enzymes: No results for input(s): CKTOTAL, CKMB, CKMBINDEX, TROPONINI in the last 168 hours. BNP (last 3 results) No results for input(s): PROBNP in the last 8760 hours. HbA1C: Recent Labs    11/18/19 0530  HGBA1C 6.8*   CBG: Recent Labs  Lab 11/18/19 0750 11/18/19 0820 11/18/19 1200 11/18/19 1217  GLUCAP 57* 95 59* 70   Lipid Profile: No results for input(s): CHOL, HDL, LDLCALC, TRIG, CHOLHDL, LDLDIRECT in the last 72 hours. Thyroid Function Tests: No results for input(s): TSH, T4TOTAL, FREET4, T3FREE, THYROIDAB in the last 72 hours. Anemia Panel: No results for input(s): VITAMINB12, FOLATE, FERRITIN, TIBC, IRON, RETICCTPCT in the last 72 hours. Sepsis Labs: Recent Labs  Lab 11/17/19 2250  LATICACIDVEN 0.8    Recent Results (from the past 240 hour(s))  SARS CORONAVIRUS 2 (TAT 6-24 HRS) Nasopharyngeal Nasopharyngeal Swab     Status: None   Collection Time: 11/17/19 10:00 PM   Specimen: Nasopharyngeal Swab  Result Value Ref Range Status   SARS Coronavirus 2 NEGATIVE NEGATIVE Final    Comment: (NOTE) SARS-CoV-2 target nucleic acids are NOT DETECTED. The SARS-CoV-2 RNA is generally detectable in upper and lower respiratory specimens during the acute phase of infection. Negative results do not preclude SARS-CoV-2 infection, do not rule out co-infections with other pathogens, and should not be used as the sole basis for treatment or other patient management decisions. Negative results must be combined with clinical observations, patient history, and epidemiological information. The expected result is Negative. Fact Sheet for Patients: HairSlick.no Fact Sheet for Healthcare Providers: quierodirigir.com This test is not yet approved or cleared by the [place] FDA and  has been  authorized for detection and/or diagnosis of SARS-CoV-2 by FDA under an Emergency Use Authorization (EUA). This EUA will remain  in effect (meaning this test can be used) for the duration of the COVID-19 declaration under Section 56 4(b)(1) of the Act, 21 U.S.C. section 360bbb-3(b)(1), unless the authorization is terminated or revoked sooner. Performed at [facility], [address], [place], [place] [address]          Radiology Studies: DG Foot Complete Left  Result Date: 11/17/2019 CLINICAL DATA:  69 year old female with recent left foot surgery on 10/17/2019. Patient presenting with pain and swelling. EXAM: LEFT FOOT - COMPLETE 3+ VIEW COMPARISON:  None. FINDINGS: Mildly displaced fractures of the distal first and second metatarsals with fixation pins. The pins appear intact. There is some bridging callus formation without complete healing at this time. Evaluation is limited as no prior images is available. No other acute fracture identified. The bones are osteopenic. There is no dislocation. There is soft tissue swelling of the forefoot. No soft tissue gas. IMPRESSION: 1. Fixation pins of the distal first and second metatarsal fractures. 2. Soft tissue swelling the foot. Clinical correlation is recommended to evaluate for infection. Electronically Signed   By: Elgie Collard M.D.   On: 11/17/2019 21:32  Scheduled Meds: . amitriptyline  25 mg Oral QHS  . enoxaparin (LOVENOX) injection  40 mg Subcutaneous Q24H  . ferrous sulfate  325 mg Oral QPM  . insulin aspart  0-9 Units Subcutaneous TID WC  . lamoTRIgine  100 mg Oral BID  . simvastatin  40 mg Oral q1800  . sodium chloride flush  10-40 mL Intracatheter Q12H  . sodium chloride flush  3 mL Intravenous Q12H   Continuous Infusions: . clindamycin (CLEOCIN) IV 600 mg (11/18/19 0836)     LOS: 0 days     Cordelia Poche, MD [facility] 11/18/2019, 1:10 PM  If 7PM-7AM, please contact  night-coverage [email or web address]

## 2019-11-18 NOTE — ED Notes (Addendum)
Please call daughter Vicente Serene [phone] if patient gets moved to a room or if any changes.

## 2019-11-18 NOTE — Progress Notes (Signed)
Hypoglycemic Event  CBG: 59  Treatment: orange juice  Symptoms: none  Follow-up CBG: Time:1217 CBG Result:70  Possible Reasons for Event: pt has not eaten lunch    Karen Williams

## 2019-11-18 NOTE — Consult Note (Signed)
WOC Nurse Consult Note: Patient receiving care in [facility] 1509.  Consult completed remotely after review of record and photo of left foot wounds. Reason for Consult: wounds to left foot Wound type: dehiscence of surgical site dorsum of left foot, with cellulitis. Pressure Injury POA: Yes/No/NA Measurement: To be provided by the bedside RN in the flowsheet section Wound bed: yellow, with drainage in photo of wound closest to the great toe. Drainage (amount, consistency, odor) yellow Periwound: edematous, erythematous  Per record, no creptius, no indications of osteomyelitis. Bunionectomy by podiatrist in [place] on 10/19/19. Dressing procedure/placement/frequency:  Wash left foot wound with soap and water. Pat dry. Place small pieces of Aquacel [place] [phone]) over the dorsal foot wound areas, cover with dry gauze, secure with kerlex. Monitor the wound area(s) for worsening of condition such as: Signs/symptoms of infection,  Increase in size,  Development of or worsening of odor, Development of pain, or increased pain at the affected locations.  Notify the medical team if any of these develop.  Thank you for the consult. WOC nurse will not follow at this time.  Please re-consult the WOC team if needed.  Helmut Muster, RN, MSN, CWOCN, CNS-BC, pager [phone]

## 2019-11-19 ENCOUNTER — Inpatient Hospital Stay (HOSPITAL_COMMUNITY): Payer: Medicaid - Out of State

## 2019-11-19 LAB — CBC
HCT: 33.3 % — ABNORMAL LOW (ref 36.0–46.0)
Hemoglobin: 10.3 g/dL — ABNORMAL LOW (ref 12.0–15.0)
MCH: 28.4 pg (ref 26.0–34.0)
MCHC: 30.9 g/dL (ref 30.0–36.0)
MCV: 91.7 fL (ref 80.0–100.0)
Platelets: 263 10*3/uL (ref 150–400)
RBC: 3.63 MIL/uL — ABNORMAL LOW (ref 3.87–5.11)
RDW: 16.4 % — ABNORMAL HIGH (ref 11.5–15.5)
WBC: 6 10*3/uL (ref 4.0–10.5)
nRBC: 0 % (ref 0.0–0.2)

## 2019-11-19 LAB — BASIC METABOLIC PANEL
Anion gap: 6 (ref 5–15)
BUN: 8 mg/dL (ref 8–23)
CO2: 30 mmol/L (ref 22–32)
Calcium: 8.8 mg/dL — ABNORMAL LOW (ref 8.9–10.3)
Chloride: 103 mmol/L (ref 98–111)
Creatinine, Ser: 0.6 mg/dL (ref 0.44–1.00)
GFR calc Af Amer: >60 mL/min (ref >60)
GFR calc non Af Amer: >60 mL/min (ref >60)
Glucose, Bld: 113 mg/dL — ABNORMAL HIGH (ref 70–99)
Potassium: 4.1 mmol/L (ref 3.5–5.1)
Sodium: 139 mmol/L (ref 135–145)

## 2019-11-19 LAB — GLUCOSE, CAPILLARY
Glucose-Capillary: 276 mg/dL — ABNORMAL HIGH (ref 70–99)
Glucose-Capillary: 102 mg/dL — ABNORMAL HIGH (ref 70–99)
Glucose-Capillary: 110 mg/dL — ABNORMAL HIGH (ref 70–99)
Glucose-Capillary: 166 mg/dL — ABNORMAL HIGH (ref 70–99)

## 2019-11-19 MED ORDER — HYDROMORPHONE HCL 1 MG/ML IJ SOLN: 1.0000 mg | Freq: Once | INTRAMUSCULAR | Status: DC

## 2019-11-19 MED ORDER — DIPHENHYDRAMINE HCL 25 MG PO CAPS
25.0000 mg | ORAL_CAPSULE | Freq: Four times a day (QID) | ORAL | Status: DC | PRN
  Administered 2019-11-19: 25 mg via ORAL
  Filled 2019-11-19: qty 1

## 2019-11-19 MED ORDER — GADOBUTROL 1 MMOL/ML IV SOLN
7.0000 mL | Freq: Once | INTRAVENOUS | Status: AC | PRN
  Administered 2019-11-19: 7 mL via INTRAVENOUS

## 2019-11-19 MED ORDER — BUTALBITAL-APAP-CAFFEINE 50-325-40 MG PO TABS
1.0000 | ORAL_TABLET | Freq: Once | ORAL | Status: AC
  Administered 2019-11-19: 1 via ORAL
  Filled 2019-11-19: qty 1

## 2019-11-19 NOTE — Progress Notes (Signed)
PROGRESS NOTE    Karen Williams  WJX:[id number] DOB: [birth year] DOA: 11/17/2019 PCP: Patient, No Pcp Per   Brief Narrative: Karen Williams is a 69 y.o. female with medical history significant for type 2 diabetes mellitus managed via lifestyle modifications, seizure disorder, anxiety, chronic iron deficiency anemia, recent left foot bunionectomy. Patient presented secondary to left foot pain and found to have worsening cellulitis.   Assessment & Plan:   Principal Problem:   Cellulitis of foot, left Active Problems:   Left foot pain   Diabetes (HCC)   Seizures (HCC)   Left foot cellulitis Post op infection from recent bunionectomy. Purulent drainage noted. Concern for abscess but also would be concerning for osteomyelitis. Started on empiric Clindamycin on admission. Patient with recurrent emesis which she has issues with as an outpatient as well. MRI obtained and with no evidence of abscess; some reactivity noted attributed to recent bunionectomy -Continue Clindamycin IV since patient has recurrent vomiting  Diabetes mellitus, type 2 Hemoglobin A1C of 6.8%. Diet controlled. Patient with hypoglycemia this admission -SSI qAC  Iron deficiency anemia Patient is on iron supplementation. Will hold for now.  Hyperlipidemia -Continue simvastatin  Seizure disorder -continue Lamictal  Headache Appears to be tension type. Possibly related to lack of caffeine -Fioricet x1   DVT prophylaxis: Lovenox Code Status:   Code Status: Full Code Family Communication: Daughter at bedside Disposition Plan: Discharge pending continued workup/management of left foot cellulitis/infection   Consultants:   None  Procedures:   None  Antimicrobials:  Clindamycin    Subjective: Some vomiting this morning. Headache as well  Objective: Vitals:   11/18/19 1414 11/18/19 2059 11/19/19 0551 11/19/19 1425  BP: (!) 155/94 124/76 133/89 (!) 147/74  Pulse: 90 87 89 79  Resp: (!) 22 20  20 13   Temp: 97.9 F (36.6 C) 98.6 F (37 C) 98.6 F (37 C) 98.7 F (37.1 C)  TempSrc:  Oral Oral Oral  SpO2: 100% 99% 100% 97%  Weight:   67.8 kg   Height:        Intake/Output Summary (Last 24 hours) at 11/19/2019 1521 Last data filed at 11/19/2019 1400 Gross per 24 hour  Intake 520 ml  Output --  Net 520 ml   Filed Weights   11/17/19 2029 11/19/19 0551  Weight: 68.9 kg 67.8 kg    Examination:  General exam: Appears calm and comfortable  Respiratory system: Respiratory effort normal. Gastrointestinal system: Abdomen is slightly distended, soft and nontender. Extremities: LLE edema of foot. Skin: No cyanosis. Left foot with less erythema noted. No active drainage  Taken on 11/18/2019      Data Reviewed: I have personally reviewed following labs and imaging studies  CBC: Recent Labs  Lab 11/17/19 2250 11/18/19 0530 11/19/19 0503  WBC 6.7 6.7 6.0  NEUTROABS 4.2 4.2  --   HGB 9.8* 9.8* 10.3*  HCT 32.8* 32.6* 33.3*  MCV 94.0 94.8 91.7  PLT 249 239 263   Basic Metabolic Panel: Recent Labs  Lab 11/17/19 2250 11/18/19 0530 11/19/19 0503  NA 144 140 139  K 3.9 3.4* 4.1  CL 112* 109 103  CO2 24 23 30   GLUCOSE 98 111* 113*  BUN 12 10 8   CREATININE 0.62 0.67 0.60  CALCIUM 8.7* 8.4* 8.8*  MG  --  1.8  --    GFR: Estimated Creatinine Clearance: 65.5 mL/min (by C-G formula based on SCr of 0.6 mg/dL). Liver Function Tests: Recent Labs  Lab 11/17/19 2250  AST 17  ALT  14  ALKPHOS 87  BILITOT 0.4  PROT 6.8  ALBUMIN 3.3*   No results for input(s): LIPASE, AMYLASE in the last 168 hours. No results for input(s): AMMONIA in the last 168 hours. Coagulation Profile: No results for input(s): INR, PROTIME in the last 168 hours. Cardiac Enzymes: No results for input(s): CKTOTAL, CKMB, CKMBINDEX, TROPONINI in the last 168 hours. BNP (last 3 results) No results for input(s): PROBNP in the last 8760 hours. HbA1C: Recent Labs    11/18/19 0530  HGBA1C 6.8*    CBG: Recent Labs  Lab 11/18/19 1404 11/18/19 1644 11/18/19 2104 11/19/19 0803 11/19/19 1231  GLUCAP 107* 92 155* 276* 102*   Lipid Profile: No results for input(s): CHOL, HDL, LDLCALC, TRIG, CHOLHDL, LDLDIRECT in the last 72 hours. Thyroid Function Tests: No results for input(s): TSH, T4TOTAL, FREET4, T3FREE, THYROIDAB in the last 72 hours. Anemia Panel: No results for input(s): VITAMINB12, FOLATE, FERRITIN, TIBC, IRON, RETICCTPCT in the last 72 hours. Sepsis Labs: Recent Labs  Lab 11/17/19 2250  LATICACIDVEN 0.8    Recent Results (from the past 240 hour(s))  SARS CORONAVIRUS 2 (TAT 6-24 HRS) Nasopharyngeal Nasopharyngeal Swab     Status: None   Collection Time: 11/17/19 10:00 PM   Specimen: Nasopharyngeal Swab  Result Value Ref Range Status   SARS Coronavirus 2 NEGATIVE NEGATIVE Final    Comment: (NOTE) SARS-CoV-2 target nucleic acids are NOT DETECTED. The SARS-CoV-2 RNA is generally detectable in upper and lower respiratory specimens during the acute phase of infection. Negative results do not preclude SARS-CoV-2 infection, do not rule out co-infections with other pathogens, and should not be used as the sole basis for treatment or other patient management decisions. Negative results must be combined with clinical observations, patient history, and epidemiological information. The expected result is Negative. Fact Sheet for Patients: SugarRoll.be Fact Sheet for Healthcare Providers: https://www.woods-mathews.com/ This test is not yet approved or cleared by the [place] FDA and  has been authorized for detection and/or diagnosis of SARS-CoV-2 by FDA under an Emergency Use Authorization (EUA). This EUA will remain  in effect (meaning this test can be used) for the duration of the COVID-19 declaration under Section 56 4(b)(1) of the Act, 21 U.S.C. section 360bbb-3(b)(1), unless the authorization is terminated or revoked  sooner. Performed at [facility], [place] [address] [place], [place] [address]   Blood culture (routine x 2)     Status: None (Preliminary result)   Collection Time: 11/17/19 10:50 PM   Specimen: BLOOD  Result Value Ref Range Status   Specimen Description   Final    BLOOD RIGHT Performed at [facility] [address] [place], [place] [address]    Special Requests   Final    BOTTLES DRAWN AEROBIC AND ANAEROBIC Blood Culture results may not be optimal due to an excessive volume of blood received in culture bottles Performed at [facility] [address], [place], [place] [address]    Culture   Final    NO GROWTH 1 DAY Performed at [facility], [place] [address], [place], [place] [address]    Report Status PENDING  Incomplete         Radiology Studies: MR FOOT LEFT W WO CONTRAST  Result Date: 11/19/2019 CLINICAL DATA:  Redness and swelling at left foot surgical site. History of bunionectomy 10/19/2019 EXAM: MRI OF THE LEFT FOREFOOT WITHOUT AND WITH CONTRAST TECHNIQUE: Multiplanar, multisequence MR imaging of the left forefoot was performed  both before and after administration of intravenous contrast. CONTRAST:  66mL GADAVIST GADOBUTROL 1 MMOL/ML IV SOLN COMPARISON:  X-ray 11/17/2019 FINDINGS: Bones/Joint/Cartilage Postoperative changes from bunionectomy involving the first metatarsal and great toe proximal phalanx. Multiple anchors within the first metatarsal diaphysis and distal metaphysis. There is edema-like bone marrow signal and enhancement throughout the first metatarsal extending from the proximal metadiaphysis to the metatarsal head. The fatty T1 marrow signal is overall preserved. Low T1 signal at the dorsomedial aspect of the first metatarsal head (series 7, images 12-13) is nonspecific. No joint effusion. Single anchor within the base of the first proximal phalanx with minimal adjacent high T2 signal. Osteotomy of the second  metatarsal head with associated marrow edema and enhancement. The bone marrow signal of the remaining visualized forefoot is normal. Ligaments Intact Lisfranc ligament. Postsurgical changes with capsular thickening at the first and second MTP joints. Collateral ligaments of the forefoot are otherwise intact. Muscles and Tendons Edema of the intraosseous muscles at the medial aspect of the forefoot at prior surgical site is nonspecific. Muscle bulk is preserved. Tendinous structures appear intact without tenosynovitis. Soft tissues Marked soft tissue swelling and edema over the dorsal aspect of the forefoot at the level of the first and second metatarsals. Multiple scattered areas of susceptibility likely postsurgical. No well-defined fluid collection. IMPRESSION: 1. Soft tissue swelling and edema over the dorsal aspect of the forefoot at the level of the first and second metatarsals likely reflecting combination of postsurgical change and cellulitis. No well-defined fluid collection or abscess. 2. Low T1 signal at the dorsomedial aspect of the first metatarsal head is nonspecific and may be related to postsurgical changes, although early osteomyelitis cannot be excluded. 3. Marrow edema and enhancement at the surgical site within the second metatarsal and great toe proximal phalanx is likely postsurgical/reactive. 4. Edema of the intraosseous muscles at the medial aspect of the forefoot at the level of the first and second metatarsals is nonspecific and may be reactive. No well-defined fluid collection. 5. Postsurgical changes from bunionectomy involving the first metatarsal and great toe proximal phalanx. Electronically Signed   By: Duanne Guess D.O.   On: 11/19/2019 11:45   DG Foot Complete Left  Result Date: 11/17/2019 CLINICAL DATA:  69 year old female with recent left foot surgery on 10/17/2019. Patient presenting with pain and swelling. EXAM: LEFT FOOT - COMPLETE 3+ VIEW COMPARISON:  None. FINDINGS:  Mildly displaced fractures of the distal first and second metatarsals with fixation pins. The pins appear intact. There is some bridging callus formation without complete healing at this time. Evaluation is limited as no prior images is available. No other acute fracture identified. The bones are osteopenic. There is no dislocation. There is soft tissue swelling of the forefoot. No soft tissue gas. IMPRESSION: 1. Fixation pins of the distal first and second metatarsal fractures. 2. Soft tissue swelling the foot. Clinical correlation is recommended to evaluate for infection. Electronically Signed   By: Elgie Collard M.D.   On: 11/17/2019 21:32        Scheduled Meds: . amitriptyline  25 mg Oral QHS  . enoxaparin (LOVENOX) injection  40 mg Subcutaneous Q24H  . ferrous sulfate  325 mg Oral QPM  . insulin aspart  0-9 Units Subcutaneous TID WC  . lamoTRIgine  100 mg Oral BID  . simvastatin  40 mg Oral q1800  . sodium chloride flush  10-40 mL Intracatheter Q12H  . sodium chloride flush  3 mL Intravenous Q12H   Continuous Infusions: .  clindamycin (CLEOCIN) IV 600 mg (11/19/19 1052)     LOS: 1 day     Jacquelin Hawking, MD [facility] 11/19/2019, 3:21 PM  If 7PM-7AM, please contact night-coverage [email or web address]

## 2019-11-20 ENCOUNTER — Inpatient Hospital Stay (HOSPITAL_COMMUNITY): Payer: Medicaid - Out of State

## 2019-11-20 LAB — GLUCOSE, CAPILLARY
Glucose-Capillary: 185 mg/dL — ABNORMAL HIGH (ref 70–99)
Glucose-Capillary: 96 mg/dL (ref 70–99)
Glucose-Capillary: 311 mg/dL — ABNORMAL HIGH (ref 70–99)
Glucose-Capillary: 218 mg/dL — ABNORMAL HIGH (ref 70–99)

## 2019-11-20 MED ORDER — DIPHENHYDRAMINE HCL 50 MG/ML IJ SOLN
25.0000 mg | Freq: Once | INTRAMUSCULAR | Status: AC
  Administered 2019-11-20: 25 mg via INTRAVENOUS
  Filled 2019-11-20: qty 1

## 2019-11-20 MED ORDER — KETOROLAC TROMETHAMINE 15 MG/ML IJ SOLN
15.0000 mg | Freq: Once | INTRAMUSCULAR | Status: AC
  Administered 2019-11-20: 15 mg via INTRAVENOUS
  Filled 2019-11-20: qty 1

## 2019-11-20 MED ORDER — SODIUM CHLORIDE 0.9 % IV SOLN
2.0000 g | Freq: Three times a day (TID) | INTRAVENOUS | Status: DC
  Administered 2019-11-20 – 2019-11-25 (×16): 2 g via INTRAVENOUS
  Filled 2019-11-20 (×18): qty 2

## 2019-11-20 MED ORDER — DEXAMETHASONE SODIUM PHOSPHATE 10 MG/ML IJ SOLN
10.0000 mg | Freq: Once | INTRAMUSCULAR | Status: AC
  Administered 2019-11-20: 10 mg via INTRAVENOUS
  Filled 2019-11-20: qty 1

## 2019-11-20 MED ORDER — METHOCARBAMOL 500 MG PO TABS
750.0000 mg | ORAL_TABLET | Freq: Four times a day (QID) | ORAL | Status: DC | PRN
  Administered 2019-11-20 – 2019-11-23 (×4): 750 mg via ORAL
  Filled 2019-11-20 (×4): qty 2

## 2019-11-20 NOTE — Consult Note (Signed)
ORTHOPAEDIC CONSULTATION  REQUESTING PHYSICIAN: Mariel Aloe, MD  Chief Complaint: Cellulitis and drainage left foot status post bunion and claw toes surgery.  HPI: Karen Williams is a 69 y.o. female who presents with cellulitis and infection left foot status post chevron osteotomy and Weil osteotomy left foot.  Patient states that her surgery was done at the end of February.  She states that her first postoperative visit about 2 weeks out she had pain swelling redness and purulent drainage.  Patient states that the podiatrist told her this was abnormal and the wounds were Steri-Stripped closed.  Patient was trying to follow-up with progressive worsening pain swelling and drainage however she states that the podiatrist would not return her call patient presented to the emergency room and is now on IV antibiotics.  Past Medical History:  Diagnosis Date  . Diabetes ([place])   . Lupus ([place])   . Seizures ([place])   . Vertigo    Past Surgical History:  Procedure Laterality Date  . ABDOMINAL HYSTERECTOMY     Social History   Socioeconomic History  . Marital status: Married    Spouse name: Not on file  . Number of children: Not on file  . Years of education: Not on file  . Highest education level: Not on file  Occupational History  . Not on file  Tobacco Use  . Smoking status: Former Smoker    Types: Cigarettes    Quit date: 09/02/1983    Years since quitting: 36.2  . Smokeless tobacco: Never Used  Substance and Sexual Activity  . Alcohol use: Never  . Drug use: Never  . Sexual activity: Not Currently  Other Topics Concern  . Not on file  Social History Narrative  . Not on file   Social Determinants of Health   Financial Resource Strain:   . Difficulty of Paying Living Expenses:   Food Insecurity:   . Worried About Charity fundraiser in the Last Year:   . Arboriculturist in the Last Year:   Transportation Needs:   . Film/video editor (Medical):   Marland Kitchen Lack of  Transportation (Non-Medical):   Physical Activity:   . Days of Exercise per Week:   . Minutes of Exercise per Session:   Stress:   . Feeling of Stress :   Social Connections:   . Frequency of Communication with Friends and Family:   . Frequency of Social Gatherings with Friends and Family:   . Attends Religious Services:   . Active Member of Clubs or Organizations:   . Attends Archivist Meetings:   Marland Kitchen Marital Status:    History reviewed. No pertinent family history. - negative except otherwise stated in the family history section Allergies  Allergen Reactions  . Tape Itching  . Tylenol [Acetaminophen] Rash   Prior to Admission medications   Medication Sig Start Date End Date Taking? Authorizing Provider  albuterol (VENTOLIN HFA) 108 (90 Base) MCG/ACT inhaler Inhale 2 puffs into the lungs every 6 (six) hours as needed for wheezing or shortness of breath.   Yes [provider]  amitriptyline (ELAVIL) 25 MG tablet Take 25 mg by mouth at bedtime.   Yes [provider]  clonazePAM (KLONOPIN) 0.5 MG tablet Take 0.5 mg by mouth 2 (two) times daily as needed for anxiety.   Yes [provider]  cyclobenzaprine (FLEXERIL) 10 MG tablet Take 10 mg by mouth 3 (three) times daily as needed for muscle spasms.  Yes [provider]  ferrous sulfate 325 (65 FE) MG tablet Take 325 mg by mouth every evening.   Yes [provider]  lamoTRIgine (LAMICTAL) 100 MG tablet Take 100 mg by mouth 2 (two) times daily.   Yes [provider]  meclizine (ANTIVERT) 25 MG tablet Take 25 mg by mouth 3 (three) times daily as needed for dizziness.   Yes [provider]  nitroGLYCERIN (NITROSTAT) 0.4 MG SL tablet Place 0.4 mg under the tongue every 5 (five) minutes as needed for chest pain.   Yes [provider]  Oxycodone HCl 10 MG TABS Take 10 mg by mouth every 6 (six) hours as needed (pain).   Yes [provider]  simvastatin  (ZOCOR) 40 MG tablet Take 40 mg by mouth daily.   Yes [provider]   CT HEAD WO CONTRAST  Result Date: 11/20/2019 CLINICAL DATA:  Headache, acute, normal neuro exam. Additional history provided: Headache with vomiting. EXAM: CT HEAD WITHOUT CONTRAST TECHNIQUE: Contiguous axial images were obtained from the base of the skull through the vertex without intravenous contrast. COMPARISON:  No pertinent prior studies available for comparison. FINDINGS: Brain: There is no evidence of acute intracranial hemorrhage, intracranial mass, midline shift or extra-axial fluid collection.No demarcated cortical infarction. Patchy hypodensity within the cerebral white matter is nonspecific, but consistent with chronic small vessel ischemic disease. Cerebral volume is normal for age. Vascular: No hyperdense vessel.  Atherosclerotic calcifications. Skull: Normal. Negative for fracture or focal lesion. Sinuses/Orbits: Visualized orbits demonstrate no acute abnormality. No significant paranasal sinus disease or mastoid effusion at the imaged levels. IMPRESSION: No evidence of acute intracranial abnormality. Patchy hypodensity within the cerebral white matter is nonspecific, but consistent with chronic small vessel ischemic disease. Electronically Signed   By: Jackey Loge DO   On: 11/20/2019 11:34   MR FOOT LEFT W WO CONTRAST  Result Date: 11/19/2019 CLINICAL DATA:  Redness and swelling at left foot surgical site. History of bunionectomy 10/19/2019 EXAM: MRI OF THE LEFT FOREFOOT WITHOUT AND WITH CONTRAST TECHNIQUE: Multiplanar, multisequence MR imaging of the left forefoot was performed both before and after administration of intravenous contrast. CONTRAST:  2mL GADAVIST GADOBUTROL 1 MMOL/ML IV SOLN COMPARISON:  X-ray 11/17/2019 FINDINGS: Bones/Joint/Cartilage Postoperative changes from bunionectomy involving the first metatarsal and great toe proximal phalanx. Multiple anchors within the first metatarsal diaphysis and  distal metaphysis. There is edema-like bone marrow signal and enhancement throughout the first metatarsal extending from the proximal metadiaphysis to the metatarsal head. The fatty T1 marrow signal is overall preserved. Low T1 signal at the dorsomedial aspect of the first metatarsal head (series 7, images 12-13) is nonspecific. No joint effusion. Single anchor within the base of the first proximal phalanx with minimal adjacent high T2 signal. Osteotomy of the second metatarsal head with associated marrow edema and enhancement. The bone marrow signal of the remaining visualized forefoot is normal. Ligaments Intact Lisfranc ligament. Postsurgical changes with capsular thickening at the first and second MTP joints. Collateral ligaments of the forefoot are otherwise intact. Muscles and Tendons Edema of the intraosseous muscles at the medial aspect of the forefoot at prior surgical site is nonspecific. Muscle bulk is preserved. Tendinous structures appear intact without tenosynovitis. Soft tissues Marked soft tissue swelling and edema over the dorsal aspect of the forefoot at the level of the first and second metatarsals. Multiple scattered areas of susceptibility likely postsurgical. No well-defined fluid collection. IMPRESSION: 1. Soft tissue swelling and edema over the dorsal aspect of  the forefoot at the level of the first and second metatarsals likely reflecting combination of postsurgical change and cellulitis. No well-defined fluid collection or abscess. 2. Low T1 signal at the dorsomedial aspect of the first metatarsal head is nonspecific and may be related to postsurgical changes, although early osteomyelitis cannot be excluded. 3. Marrow edema and enhancement at the surgical site within the second metatarsal and great toe proximal phalanx is likely postsurgical/reactive. 4. Edema of the intraosseous muscles at the medial aspect of the forefoot at the level of the first and second metatarsals is nonspecific and  may be reactive. No well-defined fluid collection. 5. Postsurgical changes from bunionectomy involving the first metatarsal and great toe proximal phalanx. Electronically Signed   By: Duanne Guess D.O.   On: 11/19/2019 11:45   - pertinent xrays, CT, MRI studies were reviewed and independently interpreted  Positive ROS: All other systems have been reviewed and were otherwise negative with the exception of those mentioned in the HPI and as above.  Physical Exam: General: Alert, no acute distress Psychiatric: Patient is competent for consent with normal mood and affect Lymphatic: No axillary or cervical lymphadenopathy Cardiovascular: No pedal edema Respiratory: No cyanosis, no use of accessory musculature GI: No organomegaly, abdomen is soft and non-tender    Images:  @ENCIMAGES @  Labs:  Lab Results  Component Value Date   HGBA1C 6.8 (H) 11/18/2019   ESRSEDRATE 28 (H) 11/17/2019   CRP 0.5 11/17/2019   REPTSTATUS PENDING 11/17/2019   CULT  11/17/2019    NO GROWTH 2 DAYS Performed at [facility], [address], [place], [place]     Lab Results  Component Value Date   ALBUMIN 3.3 (L) 11/17/2019    Neurologic: Patient does not have protective sensation bilateral lower extremities.   MUSCULOSKELETAL:   Skin: Examination the cellulitis has improved with her IV antibiotics.  There is still swelling and pain to palpation.  Patient has dehiscence of both bunion and claw toe incisions.  Patient has a palpable dorsalis pedis pulse.  Review of the radiographs shows 2 screws in place for the bunion and claw toe surgery.  Hemoglobin A1c 6.8 albumin 3.3.  Assessment: Assessment: Improving cellulitis left foot on IV antibiotics status post bunion and claw toe surgery 1 month out from surgery.  Plan: Plan: Discussed with the patient recommendation to proceed with open surgical debridement of both wounds removal of the deep retained hardware.  Risks and  benefits were discussed including persistent infection as well as infection of the bone.  Patient states she understands wished to proceed at this time.  I will post patient for surgery on Wednesday at [facility] patient will need to be transferred to [facility] for surgical intervention.  Thank you for the consult and the opportunity to see Ms. [facility], MD [place] [phone] 4:55 PM

## 2019-11-20 NOTE — Progress Notes (Signed)
Pharmacy Antibiotic Note  Karen Williams is a 69 y.o. female  with hx DM and recent left foot bunionectomy (10/17/19) presented to the ED on 11/17/2019 with c/o foot pain.  Left foot X-ray on 3/15 showed soft tissue swelling. MRI on 3/20 showed soft tissue swelling and edema with no abscess and no definitive osteomyelitis noted.  Patient was started on clindamycin on admission.  Pharmacy was consulted on 3/21 to add cefepime to abx regimen.   Plan: - cefepime 2gm IV q8h - clindamycin 600 mg IV q8h per MD _________________________________  Height: 5\' 7"  (170.2 cm) Weight: 149 lb 11.1 oz (67.9 kg) IBW/kg (Calculated) : 61.6  Temp (24hrs), Avg:98.9 F (37.2 C), Min:98.7 F (37.1 C), Max:99.1 F (37.3 C)  Recent Labs  Lab 11/17/19 2250 11/18/19 0530 11/19/19 0503  WBC 6.7 6.7 6.0  CREATININE 0.62 0.67 0.60  LATICACIDVEN 0.8  --   --     Estimated Creatinine Clearance: 65.5 mL/min (by C-G formula based on SCr of 0.6 mg/dL).    Allergies  Allergen Reactions  . Tape Itching  . Tylenol [Acetaminophen] Rash    Antimicrobials this admission:  3/18 clindamycin >>  3/21 cefepime>>  Dose adjustments this admission:  --  Microbiology results:  3/18 BCx x1:   Thank you for allowing pharmacy to be a part of this patient's care.  4/18 11/20/2019 11:50 AM

## 2019-11-20 NOTE — Progress Notes (Signed)
PROGRESS NOTE    Karen Williams  DUK:[id number] DOB: 07-07-1951 DOA: 11/17/2019 PCP: Patient, No Pcp Per   Brief Narrative: Karen Williams is a 69 y.o. female with medical history significant for type 2 diabetes mellitus managed via lifestyle modifications, seizure disorder, anxiety, chronic iron deficiency anemia, recent left foot bunionectomy. Patient presented secondary to left foot pain and found to have worsening cellulitis.   Assessment & Plan:   Principal Problem:   Cellulitis of foot, left Active Problems:   Left foot pain   Diabetes (HCC)   Seizures (HCC)   Left foot cellulitis Post op infection from recent bunionectomy. Purulent drainage noted. Concern for abscess but also would be concerning for osteomyelitis. Started on empiric Clindamycin on admission. Patient with recurrent emesis which she has issues with as an outpatient as well. MRI obtained and with no evidence of abscess or osteomyelitis; some reactivity noted attributed to recent bunionectomy. Elevated temperature today without fever. Blood cultures no growth to date. -Continue Clindamycin IV and add Cefepime -Orthopedic surgery consulted on 3/21 -Wound care recommendations: Wash left foot wound with soap and water. Pat dry. Place small pieces of Aquacel [place] [phone]) over the dorsal foot wound areas, cover with dry gauze, secure with kerlex.  Diabetes mellitus, type 2 Hemoglobin A1C of 6.8%. Diet controlled. Patient with hypoglycemia this admission -SSI qAC  Iron deficiency anemia Patient is on iron supplementation. Will hold for now.  Left knee pain Unknown etiology. No trauma  Hyperlipidemia -Continue simvastatin  Seizure disorder -continue Lamictal  Headache Appears to be tension type. Possibly related to lack of caffeine. Patient reports a history of migraines. No associated neurological issues. -Give a dose of decadron, Toradol, benadryl via IV -CT head -Robaxin   DVT prophylaxis:  Lovenox Code Status:   Code Status: Full Code Family Communication: Daughter at bedside Disposition Plan: Discharge pending continued workup/management of left foot cellulitis/infection   Consultants:   None  Procedures:   None  Antimicrobials:  Clindamycin  Cefepime   Subjective: Continues to have recurrent headache with associated vomiting. Bright light does make headache worse. Also with continued left foot pain and some left knee pain.  Objective: Vitals:   11/18/19 2059 11/19/19 0551 11/19/19 1425 11/20/19 0608  BP: 124/76 133/89 (!) 147/74 113/74  Pulse: 87 89 79 91  Resp: 20 20 13 20   Temp: 98.6 F (37 C) 98.6 F (37 C) 98.7 F (37.1 C) 99.1 F (37.3 C)  TempSrc: Oral Oral Oral Oral  SpO2: 99% 100% 97% 90%  Weight:  67.8 kg  67.9 kg  Height:        Intake/Output Summary (Last 24 hours) at 11/20/2019 1137 Last data filed at 11/19/2019 1400 Gross per 24 hour  Intake 240 ml  Output --  Net 240 ml   Filed Weights   11/17/19 2029 11/19/19 0551 11/20/19 0608  Weight: 68.9 kg 67.8 kg 67.9 kg    Examination:  General exam: Appears calm and comfortable Respiratory system: Clear to auscultation. Respiratory effort normal. Cardiovascular system: S1 & S2 heard, RRR. No murmurs, rubs, gallops or clicks. Gastrointestinal system: Abdomen is nondistended, soft and nontender. No organomegaly or masses felt. Normal bowel sounds heard. Central nervous system: Alert and oriented. No focal neurological deficits. Extremities: Left knee with tenderness medially and laterally. Dorsum of foot appears less erythematous with no active drainage but significant tenderness of dorsum especially between digits 1 and 2. Skin: No cyanosis. No rashes Psychiatry: Judgement and insight appear normal. Mood & affect appropriate.  Taken on 11/18/2019      Data Reviewed: I have personally reviewed following labs and imaging studies  CBC: Recent Labs  Lab 11/17/19 2250  11/18/19 0530 11/19/19 0503  WBC 6.7 6.7 6.0  NEUTROABS 4.2 4.2  --   HGB 9.8* 9.8* 10.3*  HCT 32.8* 32.6* 33.3*  MCV 94.0 94.8 91.7  PLT 249 239 263   Basic Metabolic Panel: Recent Labs  Lab 11/17/19 2250 11/18/19 0530 11/19/19 0503  NA 144 140 139  K 3.9 3.4* 4.1  CL 112* 109 103  CO2 24 23 30   GLUCOSE 98 111* 113*  BUN 12 10 8   CREATININE 0.62 0.67 0.60  CALCIUM 8.7* 8.4* 8.8*  MG  --  1.8  --    GFR: Estimated Creatinine Clearance: 65.5 mL/min (by C-G formula based on SCr of 0.6 mg/dL). Liver Function Tests: Recent Labs  Lab 11/17/19 2250  AST 17  ALT 14  ALKPHOS 87  BILITOT 0.4  PROT 6.8  ALBUMIN 3.3*   No results for input(s): LIPASE, AMYLASE in the last 168 hours. No results for input(s): AMMONIA in the last 168 hours. Coagulation Profile: No results for input(s): INR, PROTIME in the last 168 hours. Cardiac Enzymes: No results for input(s): CKTOTAL, CKMB, CKMBINDEX, TROPONINI in the last 168 hours. BNP (last 3 results) No results for input(s): PROBNP in the last 8760 hours. HbA1C: Recent Labs    11/18/19 0530  HGBA1C 6.8*   CBG: Recent Labs  Lab 11/19/19 1231 11/19/19 1623 11/19/19 2120 11/20/19 0746 11/20/19 1125  GLUCAP 102* 110* 166* 185* 96   Lipid Profile: No results for input(s): CHOL, HDL, LDLCALC, TRIG, CHOLHDL, LDLDIRECT in the last 72 hours. Thyroid Function Tests: No results for input(s): TSH, T4TOTAL, FREET4, T3FREE, THYROIDAB in the last 72 hours. Anemia Panel: No results for input(s): VITAMINB12, FOLATE, FERRITIN, TIBC, IRON, RETICCTPCT in the last 72 hours. Sepsis Labs: Recent Labs  Lab 11/17/19 2250  LATICACIDVEN 0.8    Recent Results (from the past 240 hour(s))  SARS CORONAVIRUS 2 (TAT 6-24 HRS) Nasopharyngeal Nasopharyngeal Swab     Status: None   Collection Time: 11/17/19 10:00 PM   Specimen: Nasopharyngeal Swab  Result Value Ref Range Status   SARS Coronavirus 2 NEGATIVE NEGATIVE Final    Comment:  (NOTE) SARS-CoV-2 target nucleic acids are NOT DETECTED. The SARS-CoV-2 RNA is generally detectable in upper and lower respiratory specimens during the acute phase of infection. Negative results do not preclude SARS-CoV-2 infection, do not rule out co-infections with other pathogens, and should not be used as the sole basis for treatment or other patient management decisions. Negative results must be combined with clinical observations, patient history, and epidemiological information. The expected result is Negative. Fact Sheet for Patients: 11/19/19 Fact Sheet for Healthcare Providers: 11/19/19 This test is not yet approved or cleared by the [email or web address] FDA and  has been authorized for detection and/or diagnosis of SARS-CoV-2 by FDA under an Emergency Use Authorization (EUA). This EUA will remain  in effect (meaning this test can be used) for the duration of the COVID-19 declaration under Section 56 4(b)(1) of the Act, 21 U.S.C. section 360bbb-3(b)(1), unless the authorization is terminated or revoked sooner. Performed at [facility], [address], [place], [address] [place]   Blood culture (routine x 2)     Status: None (Preliminary result)   Collection Time: 11/17/19 10:50 PM   Specimen: BLOOD  Result Value Ref Range Status   Specimen Description  Final    BLOOD RIGHT Performed at [facility] [address], [place], [place] [address]    Special Requests   Final    BOTTLES DRAWN AEROBIC AND ANAEROBIC Blood Culture results may not be optimal due to an excessive volume of blood received in culture bottles Performed at [facility] [address], [place], [place] [address]    Culture   Final    NO GROWTH 2 DAYS Performed at [facility] [address], [place], [place] [address]    Report Status PENDING  Incomplete         Radiology  Studies: CT HEAD WO CONTRAST  Result Date: 11/20/2019 CLINICAL DATA:  Headache, acute, normal neuro exam. Additional history provided: Headache with vomiting. EXAM: CT HEAD WITHOUT CONTRAST TECHNIQUE: Contiguous axial images were obtained from the base of the skull through the vertex without intravenous contrast. COMPARISON:  No pertinent prior studies available for comparison. FINDINGS: Brain: There is no evidence of acute intracranial hemorrhage, intracranial mass, midline shift or extra-axial fluid collection.No demarcated cortical infarction. Patchy hypodensity within the cerebral white matter is nonspecific, but consistent with chronic small vessel ischemic disease. Cerebral volume is normal for age. Vascular: No hyperdense vessel.  Atherosclerotic calcifications. Skull: Normal. Negative for fracture or focal lesion. Sinuses/Orbits: Visualized orbits demonstrate no acute abnormality. No significant paranasal sinus disease or mastoid effusion at the imaged levels. IMPRESSION: No evidence of acute intracranial abnormality. Patchy hypodensity within the cerebral white matter is nonspecific, but consistent with chronic small vessel ischemic disease. Electronically Signed   By: Kellie Simmering DO   On: 11/20/2019 11:34   MR FOOT LEFT W WO CONTRAST  Result Date: 11/19/2019 CLINICAL DATA:  Redness and swelling at left foot surgical site. History of bunionectomy 10/19/2019 EXAM: MRI OF THE LEFT FOREFOOT WITHOUT AND WITH CONTRAST TECHNIQUE: Multiplanar, multisequence MR imaging of the left forefoot was performed both before and after administration of intravenous contrast. CONTRAST:  13mL GADAVIST GADOBUTROL 1 MMOL/ML IV SOLN COMPARISON:  X-ray 11/17/2019 FINDINGS: Bones/Joint/Cartilage Postoperative changes from bunionectomy involving the first metatarsal and great toe proximal phalanx. Multiple anchors within the first metatarsal diaphysis and distal metaphysis. There is edema-like bone marrow signal and  enhancement throughout the first metatarsal extending from the proximal metadiaphysis to the metatarsal head. The fatty T1 marrow signal is overall preserved. Low T1 signal at the dorsomedial aspect of the first metatarsal head (series 7, images 12-13) is nonspecific. No joint effusion. Single anchor within the base of the first proximal phalanx with minimal adjacent high T2 signal. Osteotomy of the second metatarsal head with associated marrow edema and enhancement. The bone marrow signal of the remaining visualized forefoot is normal. Ligaments Intact Lisfranc ligament. Postsurgical changes with capsular thickening at the first and second MTP joints. Collateral ligaments of the forefoot are otherwise intact. Muscles and Tendons Edema of the intraosseous muscles at the medial aspect of the forefoot at prior surgical site is nonspecific. Muscle bulk is preserved. Tendinous structures appear intact without tenosynovitis. Soft tissues Marked soft tissue swelling and edema over the dorsal aspect of the forefoot at the level of the first and second metatarsals. Multiple scattered areas of susceptibility likely postsurgical. No well-defined fluid collection. IMPRESSION: 1. Soft tissue swelling and edema over the dorsal aspect of the forefoot at the level of the first and second metatarsals likely reflecting combination of postsurgical change and cellulitis. No well-defined fluid collection or abscess. 2. Low T1 signal at the dorsomedial  aspect of the first metatarsal head is nonspecific and may be related to postsurgical changes, although early osteomyelitis cannot be excluded. 3. Marrow edema and enhancement at the surgical site within the second metatarsal and great toe proximal phalanx is likely postsurgical/reactive. 4. Edema of the intraosseous muscles at the medial aspect of the forefoot at the level of the first and second metatarsals is nonspecific and may be reactive. No well-defined fluid collection. 5.  Postsurgical changes from bunionectomy involving the first metatarsal and great toe proximal phalanx. Electronically Signed   By: Duanne Guess D.O.   On: 11/19/2019 11:45        Scheduled Meds: . amitriptyline  25 mg Oral QHS  . dexamethasone (DECADRON) injection  10 mg Intravenous Once  . diphenhydrAMINE  25 mg Intravenous Once  . enoxaparin (LOVENOX) injection  40 mg Subcutaneous Q24H  . ferrous sulfate  325 mg Oral QPM  . insulin aspart  0-9 Units Subcutaneous TID WC  . ketorolac  15 mg Intravenous Once  . lamoTRIgine  100 mg Oral BID  . simvastatin  40 mg Oral q1800  . sodium chloride flush  10-40 mL Intracatheter Q12H  . sodium chloride flush  3 mL Intravenous Q12H   Continuous Infusions: . clindamycin (CLEOCIN) IV 600 mg (11/20/19 0921)     LOS: 2 days     Jacquelin Hawking, MD [facility] 11/20/2019, 11:37 AM  If 7PM-7AM, please contact night-coverage [email or web address]

## 2019-11-21 ENCOUNTER — Other Ambulatory Visit: Payer: Self-pay | Admitting: Physician Assistant

## 2019-11-21 DIAGNOSIS — T847XXA Infection and inflammatory reaction due to other internal orthopedic prosthetic devices, implants and grafts, initial encounter: Secondary | ICD-10-CM

## 2019-11-21 LAB — GLUCOSE, CAPILLARY
Glucose-Capillary: 123 mg/dL — ABNORMAL HIGH (ref 70–99)
Glucose-Capillary: 101 mg/dL — ABNORMAL HIGH (ref 70–99)
Glucose-Capillary: 79 mg/dL (ref 70–99)
Glucose-Capillary: 142 mg/dL — ABNORMAL HIGH (ref 70–99)

## 2019-11-21 NOTE — Progress Notes (Addendum)
PROGRESS NOTE    Karen Williams  KNL:[id number] DOB: 10-Jan-1951 DOA: 11/17/2019 PCP: Patient, No Pcp Per   Brief Narrative: Karen Williams is a 69 y.o. female with medical history significant for type 2 diabetes mellitus managed via lifestyle modifications, seizure disorder, anxiety, chronic iron deficiency anemia, recent left foot bunionectomy. Patient presented secondary to left foot pain and found to have worsening cellulitis.   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 Purulent drainage noted. Concern for abscess but also would be concerning for osteomyelitis. Started on empiric Clindamycin on admission. Patient with recurrent emesis which she has issues with as an outpatient as well. MRI obtained and with no evidence of abscess or osteomyelitis; some reactivity noted attributed to recent bunionectomy. Blood cultures no growth to date. -Continue Clindamycin IV and Cefepime (cefepime added 3/21 secondary to continued significant pain and tenderness) -Orthopedic surgery recommendations: surgery planned for 3/24 at [facility] -Wound care recommendations: Wash left foot wound with soap and water. Pat dry. Place small pieces of Aquacel [place] [phone] over the dorsal foot wound areas, cover with dry gauze, secure with kerlex.  Diabetes mellitus, type 2 Hemoglobin A1C of 6.8%. Diet controlled. Patient with hypoglycemia this admission -SSI qAC  Iron deficiency anemia Patient is on iron supplementation. Will hold for now in setting of infection  Left knee pain Unknown etiology. No trauma. Improved.  Hyperlipidemia -Continue simvastatin  Seizure disorder -Continue Lamictal  Headache Appears to be tension type. Possibly related to lack of caffeine. Patient reports a  history of migraines. No associated neurological issues. Improved with Fioricet and more definitevely with decadron, Toradol, Benadryl. CT head unremarkable for acute cause. -Continue Robaxin prn   DVT prophylaxis: Lovenox Code Status:   Code Status: Full Code Family Communication: None at bedside Disposition Plan: Transfer to [facility] for anticipated surgery on 3/24   Consultants:   None  Procedures:   None  Antimicrobials:  Clindamycin  Cefepime   Subjective: Patient is unhappy with the news that her hardware was placed incorrectly. Pain is controlled but still significant.  Objective: Vitals:   11/20/19 0608 11/20/19 1453 11/20/19 2022 11/21/19 0542  BP: 113/74 (!) 138/53 119/71 103/60  Pulse: 91 93 91 88  Resp: 20 (!) 24 18 20   Temp: 99.1 F (37.3 C)  98 F (36.7 C) 98.6 F (37 C)  TempSrc: Oral   Oral  SpO2: 90% 95% 92% 97%  Weight: 67.9 kg   65.1 kg  Height:       No intake or output data in the 24 hours ending 11/21/19 1313 Filed Weights   11/19/19 0551 11/20/19 0608 11/21/19 0542  Weight: 67.8 kg 67.9 kg 65.1 kg    Examination:  General exam: Appears calm and comfortable Respiratory system: Clear to auscultation. Respiratory effort normal. Cardiovascular system: S1 & S2 heard, RRR. No murmurs, rubs, gallops or clicks. 2+ Pedal pulses Gastrointestinal system: Abdomen is slightly distended, soft and nontender. No organomegaly or masses felt. Normal bowel sounds heard. Central nervous system: Alert and oriented. No focal neurological deficits. Extremities: No edema. No calf tenderness Skin: No cyanosis. No rashes Psychiatry: Judgement and insight appear normal. Mood & affect appropriate.   Taken on 11/18/2019      Data Reviewed: I have personally reviewed  following labs and imaging studies  CBC: Recent Labs  Lab 11/17/19 2250 11/18/19 0530 11/19/19 0503  WBC 6.7 6.7 6.0  NEUTROABS 4.2 4.2  --   HGB 9.8* 9.8* 10.3*  HCT 32.8*  32.6* 33.3*  MCV 94.0 94.8 91.7  PLT 249 239 263   Basic Metabolic Panel: Recent Labs  Lab 11/17/19 2250 11/18/19 0530 11/19/19 0503  NA 144 140 139  K 3.9 3.4* 4.1  CL 112* 109 103  CO2 24 23 30   GLUCOSE 98 111* 113*  BUN 12 10 8   CREATININE 0.62 0.67 0.60  CALCIUM 8.7* 8.4* 8.8*  MG  --  1.8  --    GFR: Estimated Creatinine Clearance: 65.5 mL/min (by C-G formula based on SCr of 0.6 mg/dL). Liver Function Tests: Recent Labs  Lab 11/17/19 2250  AST 17  ALT 14  ALKPHOS 87  BILITOT 0.4  PROT 6.8  ALBUMIN 3.3*   No results for input(s): LIPASE, AMYLASE in the last 168 hours. No results for input(s): AMMONIA in the last 168 hours. Coagulation Profile: No results for input(s): INR, PROTIME in the last 168 hours. Cardiac Enzymes: No results for input(s): CKTOTAL, CKMB, CKMBINDEX, TROPONINI in the last 168 hours. BNP (last 3 results) No results for input(s): PROBNP in the last 8760 hours. HbA1C: No results for input(s): HGBA1C in the last 72 hours. CBG: Recent Labs  Lab 11/20/19 1125 11/20/19 1700 11/20/19 2109 11/21/19 0929 11/21/19 1149  GLUCAP 96 311* 218* 123* 101*   Lipid Profile: No results for input(s): CHOL, HDL, LDLCALC, TRIG, CHOLHDL, LDLDIRECT in the last 72 hours. Thyroid Function Tests: No results for input(s): TSH, T4TOTAL, FREET4, T3FREE, THYROIDAB in the last 72 hours. Anemia Panel: No results for input(s): VITAMINB12, FOLATE, FERRITIN, TIBC, IRON, RETICCTPCT in the last 72 hours. Sepsis Labs: Recent Labs  Lab 11/17/19 2250  LATICACIDVEN 0.8    Recent Results (from the past 240 hour(s))  SARS CORONAVIRUS 2 (TAT 6-24 HRS) Nasopharyngeal Nasopharyngeal Swab     Status: None   Collection Time: 11/17/19 10:00 PM   Specimen: Nasopharyngeal Swab  Result Value Ref Range Status   SARS Coronavirus 2 NEGATIVE NEGATIVE Final    Comment: (NOTE) SARS-CoV-2 target nucleic acids are NOT DETECTED. The SARS-CoV-2 RNA is generally detectable in upper  and lower respiratory specimens during the acute phase of infection. Negative results do not preclude SARS-CoV-2 infection, do not rule out co-infections with other pathogens, and should not be used as the sole basis for treatment or other patient management decisions. Negative results must be combined with clinical observations, patient history, and epidemiological information. The expected result is Negative. Fact Sheet for Patients: 11/19/19 Fact Sheet for Healthcare Providers: 11/19/19 This test is not yet approved or cleared by the [email or web address] FDA and  has been authorized for detection and/or diagnosis of SARS-CoV-2 by FDA under an Emergency Use Authorization (EUA). This EUA will remain  in effect (meaning this test can be used) for the duration of the COVID-19 declaration under Section 56 4(b)(1) of the Act, 21 U.S.C. section 360bbb-3(b)(1), unless the authorization is terminated or revoked sooner. Performed at [facility], [address] [place], [address] [place]   Blood culture (routine x 2)     Status: None (Preliminary result)   Collection Time: 11/17/19 10:50 PM   Specimen: BLOOD  Result Value Ref Range Status   Specimen Description   Final    BLOOD RIGHT Performed at [facility], [address] [place], [place] [address]    Special Requests   Final    BOTTLES DRAWN AEROBIC AND ANAEROBIC Blood Culture results may not be optimal due to an excessive volume of blood received in culture bottles Performed at [facility] [address], [place], [place] [address]    Culture   Final    NO GROWTH 3 DAYS Performed at [facility], [place] [address] [place], [place] [address]    Report Status PENDING  Incomplete         Radiology Studies: CT HEAD WO CONTRAST  Result Date: 11/20/2019 CLINICAL DATA:  Headache, acute, normal neuro exam. Additional  history provided: Headache with vomiting. EXAM: CT HEAD WITHOUT CONTRAST TECHNIQUE: Contiguous axial images were obtained from the base of the skull through the vertex without intravenous contrast. COMPARISON:  No pertinent prior studies available for comparison. FINDINGS: Brain: There is no evidence of acute intracranial hemorrhage, intracranial mass, midline shift or extra-axial fluid collection.No demarcated cortical infarction. Patchy hypodensity within the cerebral white matter is nonspecific, but consistent with chronic small vessel ischemic disease. Cerebral volume is normal for age. Vascular: No hyperdense vessel.  Atherosclerotic calcifications. Skull: Normal. Negative for fracture or focal lesion. Sinuses/Orbits: Visualized orbits demonstrate no acute abnormality. No significant paranasal sinus disease or mastoid effusion at the imaged levels. IMPRESSION: No evidence of acute intracranial abnormality. Patchy hypodensity within the cerebral white matter is nonspecific, but consistent with chronic small vessel ischemic disease. Electronically Signed   By: Kellie Simmering DO   On: 11/20/2019 11:34        Scheduled Meds: . amitriptyline  25 mg Oral QHS  . enoxaparin (LOVENOX) injection  40 mg Subcutaneous Q24H  . ferrous sulfate  325 mg Oral QPM  . insulin aspart  0-9 Units Subcutaneous TID WC  . lamoTRIgine  100 mg Oral BID  . simvastatin  40 mg Oral q1800  . sodium chloride flush  10-40 mL Intracatheter Q12H  . sodium chloride flush  3 mL Intravenous Q12H   Continuous Infusions: . ceFEPime (MAXIPIME) IV 2 g (11/21/19 1202)  . clindamycin (CLEOCIN) IV 600 mg (11/21/19 0758)     LOS: 3 days     Cordelia Poche, MD [facility] 11/21/2019, 1:13 PM  If 7PM-7AM, please contact night-coverage [email or web address]

## 2019-11-21 NOTE — Progress Notes (Signed)
Report called to [facility] at [facility] [id number].

## 2019-11-21 NOTE — Progress Notes (Signed)
Pt arrived from [facility]  via [place]; Pt alert & oriented x4; gauze dressing on left foot; pt aware of planned surgery on 11/23/19.

## 2019-11-21 NOTE — Progress Notes (Signed)
Physical Therapy Discharge Patient Details Name: Kenzee Bassin MRN: [id number] DOB: May 13, 1951 Today's Date: 11/21/2019 Time:  -     Patient discharged from PT services secondary to surgery - will need to re-order PT to resume therapy services.  Progress and discharge plan discussed with patient and/or caregiver and agrees. GP     Rada Hay 11/21/2019, 1:33 PM Blanchard Kelch PT Acute Rehabilitation Services Pager [phone] Office [phone]

## 2019-11-21 NOTE — Progress Notes (Signed)
Pt was lying in bed awake and crying when I arrived. She said the doctor told her they are going to have to cut her foot again. She explained the story of how she got to this point and said there is infection in her foot. She said she was told there is a screw in her large toe that should be in her small toe. She said the doctor said they also need to go in and clean out the infection.  She said she noticed swelling and she was having a lot of pain. She said she went to, what she described as, an urgent care and there she was told there she had infection in her foot. She said upset about hearing about surgery again which is scheduled for Wednesday.  Pt was very appreciative of visit and prayer. She said she felt much better and that she knows everything will be okay. I had prayer w/pt bedside. She said she is very religious and she knows it's going to be okay but the shock of the news of surgery again brought her to tears. We talked about the fact that surgery is an option and discussed what if it were not an option. Pt was very appreciative of visit and prayer. Please page if additional assistance is needed. Chaplain Elmarie Shiley, [facility]   11/21/19 0800  Clinical Encounter Type  Visited With Patient

## 2019-11-22 DIAGNOSIS — F419 Anxiety disorder, unspecified: Secondary | ICD-10-CM | POA: Diagnosis present

## 2019-11-22 DIAGNOSIS — E785 Hyperlipidemia, unspecified: Secondary | ICD-10-CM | POA: Diagnosis present

## 2019-11-22 DIAGNOSIS — T847XXA Infection and inflammatory reaction due to other internal orthopedic prosthetic devices, implants and grafts, initial encounter: Secondary | ICD-10-CM

## 2019-11-22 DIAGNOSIS — E7800 Pure hypercholesterolemia, unspecified: Secondary | ICD-10-CM

## 2019-11-22 DIAGNOSIS — L03116 Cellulitis of left lower limb: Secondary | ICD-10-CM | POA: Diagnosis present

## 2019-11-22 DIAGNOSIS — E118 Type 2 diabetes mellitus with unspecified complications: Secondary | ICD-10-CM | POA: Diagnosis present

## 2019-11-22 DIAGNOSIS — G40909 Epilepsy, unspecified, not intractable, without status epilepticus: Secondary | ICD-10-CM

## 2019-11-22 LAB — MAGNESIUM: Magnesium: 1.9 mg/dL (ref 1.7–2.4)

## 2019-11-22 LAB — CBC WITH DIFFERENTIAL/PLATELET
Abs Immature Granulocytes: 0.02 10*3/uL (ref 0.00–0.07)
Basophils Absolute: 0 10*3/uL (ref 0.0–0.1)
Basophils Relative: 1 %
Eosinophils Absolute: 0.4 10*3/uL (ref 0.0–0.5)
Eosinophils Relative: 5 %
HCT: 33.9 % — ABNORMAL LOW (ref 36.0–46.0)
Hemoglobin: 10.5 g/dL — ABNORMAL LOW (ref 12.0–15.0)
Immature Granulocytes: 0 %
Lymphocytes Relative: 25 %
Lymphs Abs: 1.7 10*3/uL (ref 0.7–4.0)
MCH: 28.4 pg (ref 26.0–34.0)
MCHC: 31 g/dL (ref 30.0–36.0)
MCV: 91.6 fL (ref 80.0–100.0)
Monocytes Absolute: 0.6 10*3/uL (ref 0.1–1.0)
Monocytes Relative: 8 %
Neutro Abs: 4.3 10*3/uL (ref 1.7–7.7)
Neutrophils Relative %: 61 %
Platelets: 286 10*3/uL (ref 150–400)
RBC: 3.7 MIL/uL — ABNORMAL LOW (ref 3.87–5.11)
RDW: 17 % — ABNORMAL HIGH (ref 11.5–15.5)
WBC: 7 10*3/uL (ref 4.0–10.5)
nRBC: 0 % (ref 0.0–0.2)

## 2019-11-22 LAB — COMPREHENSIVE METABOLIC PANEL
ALT: 23 U/L (ref 0–44)
AST: 22 U/L (ref 15–41)
Albumin: 3 g/dL — ABNORMAL LOW (ref 3.5–5.0)
Alkaline Phosphatase: 105 U/L (ref 38–126)
Anion gap: 8 (ref 5–15)
BUN: 13 mg/dL (ref 8–23)
CO2: 28 mmol/L (ref 22–32)
Calcium: 8.5 mg/dL — ABNORMAL LOW (ref 8.9–10.3)
Chloride: 102 mmol/L (ref 98–111)
Creatinine, Ser: 0.89 mg/dL (ref 0.44–1.00)
GFR calc Af Amer: >60 mL/min (ref >60)
GFR calc non Af Amer: >60 mL/min (ref >60)
Glucose, Bld: 113 mg/dL — ABNORMAL HIGH (ref 70–99)
Potassium: 4.5 mmol/L (ref 3.5–5.1)
Sodium: 138 mmol/L (ref 135–145)
Total Bilirubin: 0.5 mg/dL (ref 0.3–1.2)
Total Protein: 6.4 g/dL — ABNORMAL LOW (ref 6.5–8.1)

## 2019-11-22 LAB — GLUCOSE, CAPILLARY
Glucose-Capillary: 105 mg/dL — ABNORMAL HIGH (ref 70–99)
Glucose-Capillary: 106 mg/dL — ABNORMAL HIGH (ref 70–99)
Glucose-Capillary: 92 mg/dL (ref 70–99)
Glucose-Capillary: 104 mg/dL — ABNORMAL HIGH (ref 70–99)
Glucose-Capillary: 96 mg/dL (ref 70–99)

## 2019-11-22 LAB — PROCALCITONIN: Procalcitonin: <0.1 ng/mL

## 2019-11-22 LAB — LACTIC ACID, PLASMA
Lactic Acid, Venous: 0.9 mmol/L (ref 0.5–1.9)
Lactic Acid, Venous: 0.9 mmol/L (ref 0.5–1.9)

## 2019-11-22 LAB — PHOSPHORUS: Phosphorus: 3.9 mg/dL (ref 2.5–4.6)

## 2019-11-22 NOTE — Progress Notes (Signed)
PROGRESS NOTE    Karen Williams  MPN:[id number] DOB: 19-Dec-1950 DOA: 11/17/2019 PCP: Patient, No Pcp Per     Brief Narrative:  69 y.o. BF PMHx DM type II controlled without complication,managed via lifestyle modifications, HLD, seizure disorder, anxiety, chronic iron deficiency anemia, recent left foot bunionectomy.   Presented secondary to left foot pain and found to have worsening cellulitis.   Subjective: A/O x4, negative S OB, negative CP, negative abdominal pain.  Positive left foot pain.  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 Purulent drainage noted. Concern for abscess but also would be concerning for osteomyelitis. Started on empiric Clindamycin on admission. Patient with recurrent emesis which she has issues with as an outpatient as well. MRI obtained and with no evidence of abscess or osteomyelitis; some reactivity noted attributed to recent bunionectomy. Blood cultures no growth to date. -Continue Clindamycin IV and Cefepime (cefepime added 3/21 secondary to continued significant pain and tenderness) -Orthopedic surgery recommendations: surgery planned for 3/24 at [facility] care recommendations: Wash left foot wound with soap and water. Pat dry. Place small pieces of Aquacel Kellie Simmering [phone]) over the dorsal foot wound areas, cover with dry gauze, secure with kerlex.  DM type II controlled with complication -1/95 hemoglobin A1c= 6.8 -Diet controlled -Sensitive SSI   Iron deficiency anemia -Patient is on iron supplementation. Will hold for now in  setting of infection  Left knee pain -No complaints today   Hyperlipidemia -Lipid panel pending -Simvastatin 40 mg daily  Seizure disorder -Lamictal 100 mg BID  Anxiety -Clonazepam 0.5 mg BID  Headache Appears to be tension type. Possibly related to lack of caffeine. Patient reports a history of migraines. No associated neurological issues. Improved with Fioricet and more definitevely with decadron, Toradol, Benadryl. CT head unremarkable for acute cause. -Continue Robaxin prn    DVT prophylaxis: Lovenox Code Status: Full Family Communication: 3/23 daughter on the phone counseled on plan of care answered all questions Disposition Plan: For orthopedic surgery 1.  Where the patient is from 2.  Anticipated d/c place. 3.  Barriers to d/c per orthopedic surgery   Consultants:  Orthopedic surgery  Procedures/Significant Events:     I have personally reviewed and interpreted all radiology studies and my findings are as above.  VENTILATOR SETTINGS:    Cultures   Antimicrobials: Anti-infectives (From admission, onward)   Start     Dose/Rate Stop   11/20/19 1200  ceFEPIme (MAXIPIME) 2 g in sodium chloride 0.9 % 100 mL IVPB     2 g 200 mL/hr over 30 Minutes     11/18/19 0800  clindamycin (CLEOCIN) IVPB 600 mg    Note to Pharmacy: Okay for pharmacy to adjust dose.   600 mg 100 mL/hr over 30 Minutes     11/17/19 2045  clindamycin (CLEOCIN) IVPB 900 mg     900 mg 100 mL/hr over 30 Minutes 11/18/19 0006       Devices    LINES / TUBES:      Continuous Infusions: . ceFEPime (MAXIPIME) IV 2 g (  11/22/19 1300)  . clindamycin (CLEOCIN) IV 600 mg (11/22/19 1659)     Objective: Vitals:   11/21/19 2300 11/22/19 0356 11/22/19 0742 11/22/19 1629  BP:  117/68 (!) 96/58 116/62  Pulse:  86 78 86  Resp:  15 17 17   Temp:  98.4 F (36.9 C) 98.1 F (36.7 C) 98 F (36.7 C)  TempSrc:  Oral Oral Oral  SpO2: 98% 95% 97% 100%  Weight:      Height:         Intake/Output Summary (Last 24 hours) at 11/22/2019 1924 Last data filed at 11/22/2019 1300 Gross per 24 hour  Intake 480 ml  Output --  Net 480 ml   Filed Weights   11/19/19 0551 11/20/19 0608 11/21/19 0542  Weight: 67.8 kg 67.9 kg 65.1 kg    Examination:  General: A/O x4 no acute respiratory distress Eyes: negative scleral hemorrhage, negative anisocoria, negative icterus ENT: Negative Runny nose, negative gingival bleeding, Neck:  Negative scars, masses, torticollis, lymphadenopathy, JVD Lungs: Clear to auscultation bilaterally without wheezes or crackles Cardiovascular: Regular rate and rhythm without murmur gallop or rub normal S1 and S2 Abdomen: negative abdominal pain, nondistended, positive soft, bowel sounds, no rebound, no ascites, no appreciable mass Extremities: LEFT foot gauze wrap DP/PT pulse 2+, negative pain to palpation, negative warmth to touch Skin: Negative rashes, lesions, ulcers Psychiatric:  Negative depression, negative anxiety, negative fatigue, negative mania  Central nervous system:  Cranial nerves II through XII intact, tongue/uvula midline, all extremities muscle strength 5/5, sensation intact throughout,  negative dysarthria, negative expressive aphasia, negative receptive aphasia.  .     Data Reviewed: Care during the described time interval was provided by me .  I have reviewed this patient's available data, including medical history, events of note, physical examination, and all test results as part of my evaluation.  CBC: Recent Labs  Lab 11/17/19 2250 11/18/19 0530 11/19/19 0503 11/22/19 1507  WBC 6.7 6.7 6.0 7.0  NEUTROABS 4.2 4.2  --  4.3  HGB 9.8* 9.8* 10.3* 10.5*  HCT 32.8* 32.6* 33.3* 33.9*  MCV 94.0 94.8 91.7 91.6  PLT 249 239 263 286   Basic Metabolic Panel: Recent Labs  Lab 11/17/19 2250 11/18/19 0530 11/19/19 0503 11/22/19 1507  NA 144 140 139 138  K 3.9 3.4* 4.1 4.5  CL 112* 109 103 102  CO2 24 23 30 28   GLUCOSE  98 111* 113* 113*  BUN 12 10 8 13   CREATININE 0.62 0.67 0.60 0.89  CALCIUM 8.7* 8.4* 8.8* 8.5*  MG  --  1.8  --  1.9  PHOS  --   --   --  3.9   GFR: Estimated Creatinine Clearance: 58.8 mL/min (by C-G formula based on SCr of 0.89 mg/dL). Liver Function Tests: Recent Labs  Lab 11/17/19 2250 11/22/19 1507  AST 17 22  ALT 14 23  ALKPHOS 87 105  BILITOT 0.4 0.5  PROT 6.8 6.4*  ALBUMIN 3.3* 3.0*   No results for input(s): LIPASE, AMYLASE in the last 168 hours. No results for input(s): AMMONIA in the last 168 hours. Coagulation Profile: No results for input(s): INR, PROTIME in the last 168 hours. Cardiac Enzymes: No results for input(s): CKTOTAL, CKMB, CKMBINDEX, TROPONINI in the last 168 hours. BNP (last 3 results) No results for input(s): PROBNP in the last 8760 hours. HbA1C: No results for input(s): HGBA1C in the last 72 hours. CBG: Recent Labs  Lab 11/21/19 2020 11/22/19 11/24/19 11/22/19 0743 11/22/19 1135 11/22/19 1656  GLUCAP 142* 105* 106* 92 104*   Lipid Profile: No results for input(s): CHOL, HDL, LDLCALC, TRIG, CHOLHDL, LDLDIRECT in the last 72 hours. Thyroid Function Tests: No results for input(s): TSH, T4TOTAL, FREET4, T3FREE, THYROIDAB in the last 72 hours. Anemia Panel: No results for input(s): VITAMINB12, FOLATE, FERRITIN, TIBC, IRON, RETICCTPCT in the last 72 hours. Sepsis Labs: Recent Labs  Lab 11/17/19 2250 11/22/19 1507  PROCALCITON  --  <0.10  LATICACIDVEN 0.8 0.9    Recent Results (from the past 240 hour(s))  SARS CORONAVIRUS 2 (TAT 6-24 HRS) Nasopharyngeal Nasopharyngeal Swab     Status: None   Collection Time: 11/17/19 10:00 PM   Specimen: Nasopharyngeal Swab  Result Value Ref Range Status   SARS Coronavirus 2 NEGATIVE NEGATIVE Final    Comment: (NOTE) SARS-CoV-2 target nucleic acids are NOT DETECTED. The SARS-CoV-2 RNA is generally detectable in upper and lower respiratory specimens during the acute phase of infection. Negative results  do not preclude SARS-CoV-2 infection, do not rule out co-infections with other pathogens, and should not be used as the sole basis for treatment or other patient management decisions. Negative results must be combined with clinical observations, patient history, and epidemiological information. The expected result is Negative. Fact Sheet for Patients: HairSlick.no Fact Sheet for Healthcare Providers: quierodirigir.com This test is not yet approved or cleared by the [place] FDA and  has been authorized for detection and/or diagnosis of SARS-CoV-2 by FDA under an Emergency Use Authorization (EUA). This EUA will remain  in effect (meaning this test can be used) for the duration of the COVID-19 declaration under Section 56 4(b)(1) of the Act, 21 U.S.C. section 360bbb-3(b)(1), unless the authorization is terminated or revoked sooner. Performed at [facility], [address], [place], [place] [address]   Blood culture (routine x 2)     Status: None (Preliminary result)   Collection Time: 11/17/19 10:50 PM   Specimen: BLOOD  Result Value Ref Range Status   Specimen Description BLOOD RIGHT  Final   Special Requests   Final    BOTTLES DRAWN AEROBIC AND ANAEROBIC Blood Culture results may not be optimal due to an excessive volume of blood received in culture bottles Performed at [facility], [address], [place], [place] [address]    Culture NO GROWTH 4 DAYS  Final   Report Status PENDING  Incomplete         Radiology Studies: No results found.      Scheduled Meds: . amitriptyline  25 mg Oral QHS  . enoxaparin (LOVENOX) injection  40 mg Subcutaneous Q24H  . ferrous sulfate  325 mg Oral QPM  . insulin aspart  0-9 Units Subcutaneous TID WC  . lamoTRIgine  100 mg Oral BID  . simvastatin  40 mg Oral q1800  . sodium chloride flush  10-40 mL Intracatheter Q12H  . sodium chloride flush  3 mL  Intravenous Q12H   Continuous Infusions: . ceFEPime (MAXIPIME) IV 2 g (11/22/19 1300)  . clindamycin (CLEOCIN) IV 600 mg (11/22/19 1659)     LOS: 4 days    Time spent:40 min    Brylee Berk, Roselind Messier, MD [facility] Pager [phone]  If 7PM-7AM, please contact night-coverage [email or web address] Password [facility] 11/22/2019, 7:24 PM

## 2019-11-22 NOTE — Progress Notes (Signed)
Patient ID: Karen Williams, female   DOB: [birth year], 69 y.o.   MRN: [id number] Patient's surrounding cellulitis on the left foot has resolved nicely on the IV antibiotics.  She still has pain and swelling over the surgical incisions with wound dehiscence.  Will plan for irrigation and debridement Wednesday of the 2 surgical incisions on the left foot removal of 2 screws from the first and second metatarsal.  Will obtain deep cultures but with patient's IV antibiotics doubt cultures would be positive.  Would plan for discharge on oral antibiotics for 4 weeks.

## 2019-11-22 NOTE — Anesthesia Preprocedure Evaluation (Addendum)
Anesthesia Evaluation  Patient identified by MRN, date of birth, ID band Patient awake    Reviewed: Allergy & Precautions, NPO status , Patient's Chart, lab work & pertinent test results  Airway Mallampati: II  TM Distance: >3 FB Neck ROM: Full    Dental no notable dental hx. (+) Teeth Intact, Dental Advisory Given   Pulmonary former smoker,    Pulmonary exam normal breath sounds clear to auscultation       Cardiovascular Exercise Tolerance: Good Normal cardiovascular exam Rhythm:Regular Rate:Normal     Neuro/Psych Seizures -, Well Controlled,  Anxiety Last 4 years ago    GI/Hepatic negative GI ROS, Neg liver ROS,   Endo/Other  diabetes, Well Controlled, Type 2  Renal/GU negative Renal ROSK+ 4.5 Cr 0.89     Musculoskeletal negative musculoskeletal ROS (+)   Abdominal   Peds  Hematology Hgb 10.5   Anesthesia Other Findings   Reproductive/Obstetrics negative OB ROS                           Anesthesia Physical Anesthesia Plan  ASA: III  Anesthesia Plan: General   Post-op Pain Management:    Induction: Intravenous  PONV Risk Score and Plan: 4 or greater and Treatment may vary due to age or medical condition, Ondansetron, Dexamethasone and Midazolam  Airway Management Planned: LMA  Additional Equipment:   Intra-op Plan:   Post-operative Plan:   Informed Consent: I have reviewed the patients History and Physical, chart, labs and discussed the procedure including the risks, benefits and alternatives for the proposed anesthesia with the patient or authorized representative who has indicated his/her understanding and acceptance.     Dental advisory given  Plan Discussed with: CRNA  Anesthesia Plan Comments: (Cellulitus L foot)       Anesthesia Quick Evaluation

## 2019-11-22 NOTE — Plan of Care (Signed)
  Problem: Clinical Measurements: Goal: Ability to maintain clinical measurements within normal limits will improve Outcome: Progressing Goal: Will remain free from infection Outcome: Progressing   Problem: Education: Goal: Knowledge of General Education information will improve Description: Including pain rating scale, medication(s)/side effects and non-pharmacologic comfort measures Outcome: Progressing   Problem: Activity: Goal: Risk for activity intolerance will decrease Outcome: Progressing   Problem: Pain Managment: Goal: General experience of comfort will improve Outcome: Progressing   Problem: Skin Integrity: Goal: Risk for impaired skin integrity will decrease Outcome: Progressing

## 2019-11-22 NOTE — H&P (View-Only) (Signed)
Patient ID: Karen Williams, female   DOB: 06/02/1951, 69 y.o.   MRN: [id number] Patient's surrounding cellulitis on the left foot has resolved nicely on the IV antibiotics.  She still has pain and swelling over the surgical incisions with wound dehiscence.  Will plan for irrigation and debridement Wednesday of the 2 surgical incisions on the left foot removal of 2 screws from the first and second metatarsal.  Will obtain deep cultures but with patient's IV antibiotics doubt cultures would be positive.  Would plan for discharge on oral antibiotics for 4 weeks. 

## 2019-11-22 NOTE — Plan of Care (Signed)
?  Problem: Clinical Measurements: ?Goal: Will remain free from infection ?Outcome: Progressing ?  ?Problem: Activity: ?Goal: Risk for activity intolerance will decrease ?Outcome: Progressing ?  ?Problem: Coping: ?Goal: Level of anxiety will decrease ?Outcome: Progressing ?  ?Problem: Pain Managment: ?Goal: General experience of comfort will improve ?Outcome: Progressing ?  ?Problem: Safety: ?Goal: Ability to remain free from injury will improve ?Outcome: Progressing ?  ?

## 2019-11-23 ENCOUNTER — Encounter (HOSPITAL_COMMUNITY): Payer: Self-pay | Admitting: Internal Medicine

## 2019-11-23 ENCOUNTER — Encounter (HOSPITAL_COMMUNITY)
Admission: EM | Disposition: A | Discharge status: Home Health | Payer: Self-pay | Source: Home / Self Care | Attending: Family Medicine

## 2019-11-23 ENCOUNTER — Inpatient Hospital Stay (HOSPITAL_COMMUNITY): Payer: Medicaid - Out of State | Admitting: Anesthesiology

## 2019-11-23 HISTORY — PX: HARDWARE REMOVAL: SHX979

## 2019-11-23 LAB — COMPREHENSIVE METABOLIC PANEL
ALT: 22 U/L (ref 0–44)
AST: 19 U/L (ref 15–41)
Albumin: 2.9 g/dL — ABNORMAL LOW (ref 3.5–5.0)
Alkaline Phosphatase: 105 U/L (ref 38–126)
Anion gap: 8 (ref 5–15)
BUN: 7 mg/dL — ABNORMAL LOW (ref 8–23)
CO2: 29 mmol/L (ref 22–32)
Calcium: 8.6 mg/dL — ABNORMAL LOW (ref 8.9–10.3)
Chloride: 103 mmol/L (ref 98–111)
Creatinine, Ser: 0.74 mg/dL (ref 0.44–1.00)
GFR calc Af Amer: >60 mL/min (ref >60)
GFR calc non Af Amer: >60 mL/min (ref >60)
Glucose, Bld: 96 mg/dL (ref 70–99)
Potassium: 4.6 mmol/L (ref 3.5–5.1)
Sodium: 140 mmol/L (ref 135–145)
Total Bilirubin: 0.5 mg/dL (ref 0.3–1.2)
Total Protein: 6.2 g/dL — ABNORMAL LOW (ref 6.5–8.1)

## 2019-11-23 LAB — GLUCOSE, CAPILLARY
Glucose-Capillary: 108 mg/dL — ABNORMAL HIGH (ref 70–99)
Glucose-Capillary: 95 mg/dL (ref 70–99)
Glucose-Capillary: 93 mg/dL (ref 70–99)
Glucose-Capillary: 126 mg/dL — ABNORMAL HIGH (ref 70–99)
Glucose-Capillary: 158 mg/dL — ABNORMAL HIGH (ref 70–99)
Glucose-Capillary: 129 mg/dL — ABNORMAL HIGH (ref 70–99)

## 2019-11-23 LAB — CBC
HCT: 35.7 % — ABNORMAL LOW (ref 36.0–46.0)
Hemoglobin: 11.2 g/dL — ABNORMAL LOW (ref 12.0–15.0)
MCH: 28.3 pg (ref 26.0–34.0)
MCHC: 31.4 g/dL (ref 30.0–36.0)
MCV: 90.2 fL (ref 80.0–100.0)
Platelets: 305 10*3/uL (ref 150–400)
RBC: 3.96 MIL/uL (ref 3.87–5.11)
RDW: 17 % — ABNORMAL HIGH (ref 11.5–15.5)
WBC: 6.2 10*3/uL (ref 4.0–10.5)
nRBC: 0 % (ref 0.0–0.2)

## 2019-11-23 LAB — LIPID PANEL
Cholesterol: 159 mg/dL (ref 0–200)
HDL: 50 mg/dL (ref >40)
LDL Cholesterol: 92 mg/dL (ref 0–99)
Total CHOL/HDL Ratio: 3.2 RATIO
Triglycerides: 87 mg/dL (ref <150)
VLDL: 17 mg/dL (ref 0–40)

## 2019-11-23 LAB — CULTURE, BLOOD (ROUTINE X 2): Culture: NO GROWTH

## 2019-11-23 LAB — PHOSPHORUS: Phosphorus: 4 mg/dL (ref 2.5–4.6)

## 2019-11-23 LAB — SURGICAL PCR SCREEN
MRSA, PCR: NEGATIVE
Staphylococcus aureus: POSITIVE — AB

## 2019-11-23 LAB — MAGNESIUM: Magnesium: 2 mg/dL (ref 1.7–2.4)

## 2019-11-23 LAB — PROCALCITONIN: Procalcitonin: <0.1 ng/mL

## 2019-11-23 SURGERY — REMOVAL, HARDWARE
Anesthesia: General | Laterality: Left

## 2019-11-23 MED ORDER — CEFAZOLIN SODIUM-DEXTROSE 2-3 GM-%(50ML) IV SOLR
INTRAVENOUS | Status: DC | PRN
  Administered 2019-11-23: 2 g via INTRAVENOUS

## 2019-11-23 MED ORDER — OXYCODONE HCL 5 MG PO TABS
ORAL_TABLET | ORAL | Status: AC
  Filled 2019-11-23: qty 2

## 2019-11-23 MED ORDER — CHLORHEXIDINE GLUCONATE 4 % EX LIQD: 60.0000 mL | Freq: Once | CUTANEOUS | Status: DC

## 2019-11-23 MED ORDER — FENTANYL CITRATE (PF) 100 MCG/2ML IJ SOLN
INTRAMUSCULAR | Status: DC | PRN
  Administered 2019-11-23: 50 ug via INTRAVENOUS
  Administered 2019-11-23: 25 ug via INTRAVENOUS

## 2019-11-23 MED ORDER — HYDROMORPHONE HCL 1 MG/ML IJ SOLN
INTRAMUSCULAR | Status: AC
  Filled 2019-11-23: qty 1

## 2019-11-23 MED ORDER — MIDAZOLAM HCL 2 MG/2ML IJ SOLN
INTRAMUSCULAR | Status: AC
  Filled 2019-11-23: qty 2

## 2019-11-23 MED ORDER — PROPOFOL 10 MG/ML IV BOLUS
INTRAVENOUS | Status: DC | PRN
  Administered 2019-11-23: 150 mg via INTRAVENOUS
  Administered 2019-11-23: 50 mg via INTRAVENOUS

## 2019-11-23 MED ORDER — MUPIROCIN 2 % EX OINT
TOPICAL_OINTMENT | CUTANEOUS | Status: AC
  Administered 2019-11-23: 1 via TOPICAL
  Filled 2019-11-23: qty 22

## 2019-11-23 MED ORDER — ONDANSETRON HCL 4 MG/2ML IJ SOLN: 4.0000 mg | Freq: Once | INTRAMUSCULAR | Status: DC | PRN

## 2019-11-23 MED ORDER — SODIUM CHLORIDE 0.9 % IV SOLN: INTRAVENOUS | Status: DC

## 2019-11-23 MED ORDER — OXYCODONE HCL 5 MG PO TABS: 5.0000 mg | ORAL_TABLET | Freq: Once | ORAL | Status: DC | PRN

## 2019-11-23 MED ORDER — ONDANSETRON HCL 4 MG/2ML IJ SOLN
INTRAMUSCULAR | Status: DC | PRN
  Administered 2019-11-23: 4 mg via INTRAVENOUS

## 2019-11-23 MED ORDER — DEXAMETHASONE SODIUM PHOSPHATE 4 MG/ML IJ SOLN
INTRAMUSCULAR | Status: DC | PRN
  Administered 2019-11-23: 4 mg via INTRAVENOUS

## 2019-11-23 MED ORDER — FENTANYL CITRATE (PF) 250 MCG/5ML IJ SOLN
INTRAMUSCULAR | Status: AC
  Filled 2019-11-23: qty 5

## 2019-11-23 MED ORDER — MEPERIDINE HCL 25 MG/ML IJ SOLN: 6.2500 mg | INTRAMUSCULAR | Status: DC | PRN

## 2019-11-23 MED ORDER — LIDOCAINE HCL (CARDIAC) PF 100 MG/5ML IV SOSY
PREFILLED_SYRINGE | INTRAVENOUS | Status: DC | PRN
  Administered 2019-11-23: 80 mg via INTRAVENOUS

## 2019-11-23 MED ORDER — HYDROMORPHONE HCL 1 MG/ML IJ SOLN
0.2500 mg | INTRAMUSCULAR | Status: DC | PRN
  Administered 2019-11-23 (×3): 0.5 mg via INTRAVENOUS

## 2019-11-23 MED ORDER — 0.9 % SODIUM CHLORIDE (POUR BTL) OPTIME
TOPICAL | Status: DC | PRN
  Administered 2019-11-23: 1000 mL

## 2019-11-23 MED ORDER — MUPIROCIN 2 % EX OINT: 1.0000 "application " | TOPICAL_OINTMENT | Freq: Two times a day (BID) | CUTANEOUS | Status: DC

## 2019-11-23 MED ORDER — LACTATED RINGERS IV SOLN: INTRAVENOUS | Status: DC

## 2019-11-23 MED ORDER — HYDROMORPHONE HCL 1 MG/ML IJ SOLN
1.0000 mg | INTRAMUSCULAR | Status: DC | PRN
  Administered 2019-11-23 – 2019-11-25 (×7): 1 mg via INTRAVENOUS
  Filled 2019-11-23 (×7): qty 1

## 2019-11-23 MED ORDER — DOCUSATE SODIUM 100 MG PO CAPS
100.0000 mg | ORAL_CAPSULE | Freq: Two times a day (BID) | ORAL | Status: DC
  Administered 2019-11-23 – 2019-11-25 (×5): 100 mg via ORAL
  Filled 2019-11-23 (×5): qty 1

## 2019-11-23 MED ORDER — OXYCODONE HCL 5 MG/5ML PO SOLN: 5.0000 mg | Freq: Once | ORAL | Status: DC | PRN

## 2019-11-23 MED ORDER — CEFAZOLIN SODIUM-DEXTROSE 2-4 GM/100ML-% IV SOLN
2.0000 g | INTRAVENOUS | Status: DC
  Filled 2019-11-23: qty 100

## 2019-11-23 SURGICAL SUPPLY — 45 items
BANDAGE ESMARK 6X9 LF (GAUZE/BANDAGES/DRESSINGS) IMPLANT
BNDG COHESIVE 4X5 TAN STRL (GAUZE/BANDAGES/DRESSINGS) IMPLANT
BNDG ESMARK 6X9 LF (GAUZE/BANDAGES/DRESSINGS)
BNDG GAUZE ELAST 4 BULKY (GAUZE/BANDAGES/DRESSINGS) ×3 IMPLANT
COVER SURGICAL LIGHT HANDLE (MISCELLANEOUS) ×6 IMPLANT
COVER WAND RF STERILE (DRAPES) ×3 IMPLANT
CUFF TOURN SGL QUICK 34 (TOURNIQUET CUFF)
CUFF TOURN SGL QUICK 42 (TOURNIQUET CUFF) IMPLANT
CUFF TRNQT CYL 34X4.125X (TOURNIQUET CUFF) IMPLANT
DRAPE C-ARM 42X72 X-RAY (DRAPES) IMPLANT
DRAPE INCISE IOBAN 66X45 STRL (DRAPES) IMPLANT
DRAPE ORTHO SPLIT 77X108 STRL (DRAPES)
DRAPE SURG ORHT 6 SPLT 77X108 (DRAPES) IMPLANT
DRAPE U-SHAPE 47X51 STRL (DRAPES) ×3 IMPLANT
DRSG EMULSION OIL 3X3 NADH (GAUZE/BANDAGES/DRESSINGS) ×3 IMPLANT
DRSG PAD ABDOMINAL 8X10 ST (GAUZE/BANDAGES/DRESSINGS) ×3 IMPLANT
DURAPREP 26ML APPLICATOR (WOUND CARE) ×3 IMPLANT
ELECT REM PT RETURN 9FT ADLT (ELECTROSURGICAL) ×3
ELECTRODE REM PT RTRN 9FT ADLT (ELECTROSURGICAL) ×1 IMPLANT
GAUZE SPONGE 4X4 12PLY STRL (GAUZE/BANDAGES/DRESSINGS) ×3 IMPLANT
GLOVE BIOGEL PI IND STRL 9 (GLOVE) ×1 IMPLANT
GLOVE BIOGEL PI INDICATOR 9 (GLOVE) ×2
GLOVE SURG ORTHO 9.0 STRL STRW (GLOVE) ×3 IMPLANT
GOWN STRL REUS W/ TWL XL LVL3 (GOWN DISPOSABLE) ×3 IMPLANT
GOWN STRL REUS W/TWL XL LVL3 (GOWN DISPOSABLE) ×6
KIT BASIN OR (CUSTOM PROCEDURE TRAY) ×3 IMPLANT
KIT DRSG PREVENA PLUS 7DAY 125 (MISCELLANEOUS) ×2 IMPLANT
KIT PREVENA INCISION MGT 13 (CANNISTER) ×2 IMPLANT
KIT TURNOVER KIT B (KITS) ×3 IMPLANT
MANIFOLD NEPTUNE II (INSTRUMENTS) ×3 IMPLANT
NS IRRIG 1000ML POUR BTL (IV SOLUTION) ×3 IMPLANT
PACK ORTHO EXTREMITY (CUSTOM PROCEDURE TRAY) ×3 IMPLANT
PAD ARMBOARD 7.5X6 YLW CONV (MISCELLANEOUS) ×6 IMPLANT
SPONGE LAP 18X18 RF (DISPOSABLE) IMPLANT
STAPLER VISISTAT 35W (STAPLE) IMPLANT
STOCKINETTE IMPERVIOUS 9X36 MD (GAUZE/BANDAGES/DRESSINGS) IMPLANT
SUT ETHILON 2 0 PSLX (SUTURE) IMPLANT
SUT VIC AB 0 CT1 27 (SUTURE)
SUT VIC AB 0 CT1 27XBRD ANBCTR (SUTURE) IMPLANT
SUT VIC AB 2-0 CT1 27 (SUTURE)
SUT VIC AB 2-0 CT1 TAPERPNT 27 (SUTURE) IMPLANT
TOWEL GREEN STERILE (TOWEL DISPOSABLE) ×3 IMPLANT
TOWEL GREEN STERILE FF (TOWEL DISPOSABLE) ×3 IMPLANT
UNDERPAD 30X30 (UNDERPADS AND DIAPERS) ×3 IMPLANT
WATER STERILE IRR 1000ML POUR (IV SOLUTION) ×3 IMPLANT

## 2019-11-23 NOTE — Interval H&P Note (Signed)
History and Physical Interval Note:  11/23/2019 6:50 AM  Karen Williams  has presented today for surgery, with the diagnosis of Post Operative Infection Left Foot.  The various methods of treatment have been discussed with the patient and family. After consideration of risks, benefits and other options for treatment, the patient has consented to  Procedure(s): LEFT FOOT DEBRIDEMENT AND REMOVE HARDWARE (Left) as a surgical intervention.  The patient's history has been reviewed, patient examined, no change in status, stable for surgery.  I have reviewed the patient's chart and labs.  Questions were answered to the patient's satisfaction.     Nadara Mustard

## 2019-11-23 NOTE — Op Note (Signed)
11/23/2019  9:13 AM  PATIENT:  Karen Williams    PRE-OPERATIVE DIAGNOSIS:  Post Operative Infection Left Foot with deep retained hardware  POST-OPERATIVE DIAGNOSIS:  Same  PROCEDURE:  LEFT FOOT DEBRIDEMENT WITH PARTIAL EXCISION OF THE FIRST AND SECOND METATARSAL. REMOVE deep retained hardware. Placement of a 13 cm Prevena wound VAC. Local tissue rearrangement for 2 wound closures each 2 x 7 cm.  SURGEON:  Nadara Mustard, MD  PHYSICIAN ASSISTANT:None ANESTHESIA:   General  PREOPERATIVE INDICATIONS:  [name] is a  69 y.o. female with a diagnosis of Post Operative Infection Left Foot who failed conservative measures and elected for surgical management.    The risks benefits and alternatives were discussed with the patient preoperatively including but not limited to the risks of infection, bleeding, nerve injury, cardiopulmonary complications, the need for revision surgery, among others, and the patient was willing to proceed.  OPERATIVE IMPLANTS: 13 cm Prevena wound VAC.  @ENCIMAGES @  OPERATIVE FINDINGS: Infection involving nonabsorbable suture and deep retained hardware.  Soft tissue sent for cultures.  OPERATIVE PROCEDURE: Patient brought the operating room and underwent a general anesthetic.  After adequate levels anesthesia were obtained patient's left lower extremity was prepped using DuraPrep draped in a sterile field a timeout was called.  A longitudinal elliptical incision was made around the previous dorsal incisions over the first and second metatarsal.  This was carried down to the extensor tendon on both incisions.  The extensor tendons were protected there was significant amount of inflammatory tissue overlying the bone.  This was removed with a rondure.  The deep retained screws in the first metatarsal and second metatarsal were removed without complications.  A osteotome and rondure were used to partially excise the dorsum of the first and second metatarsal.  Soft  tissue was sent for cultures.  There was infection and involve the nonabsorbable retained suture over the medial capsule reconstruction of the great toe.  This was removed without problems.  The wounds were both irrigated with normal saline.  Local tissue rearrangement was used to close the elliptical wounds that were 2 x 7 cm each.  Incisions closed without difficulty.  A Prevena wound VAC was applied this had a good suction fit patient was extubated taken the PACU in stable condition.   DISCHARGE PLANNING:  Antibiotic duration: Continue antibiotics and base antibiotic coverage based on tissue cultures.  Weightbearing: Touchdown weightbearing on the left  Pain medication: Opioid pathway  Dressing care/ Wound VAC: Discharged with the Praveena wound VAC  Ambulatory devices: Walker  Discharge to: Anticipate discharge to home  Follow-up: In the office 1 week post operative.

## 2019-11-23 NOTE — Evaluation (Signed)
Physical Therapy Evaluation Patient Details Name: Karen Williams MRN: [id number] DOB: 03/22/1951 Today's Date: 11/23/2019   History of Present Illness  Pt is 69 yo female with PMH including DM II, HLD, seizure disorder, anxiety, anemia, and recent L foot bunionectomy.  Pt was admitted with cellulitis and post op infection L foot.  She is s/p L foot debridement with partial excision of 1st and 2nd metatarsal and removal of hardware on 11/23/19.  Clinical Impression  Pt admitted with above diagnosis. Pt required encouragement and education on importance of OOB activity.  Reporting pain in L foot at 9/10 but was able to ambulate and transfer with PT.  Pt was premedicated for pain and with minimal pain signs/symptoms during therapy.  She ambulated 36' with RW and NWB status - declined trying TDWB.  Pt educated on transfer and gait techniques as well as DME recommendations.  Pt needs further gait and stair training, as well as, further discussion and planning of DME use. Pt currently with functional limitations due to the deficits listed below (see PT Problem List). Pt will benefit from skilled PT to increase their independence and safety with mobility to allow discharge to the venue listed below.       Follow Up Recommendations Home health PT    Equipment Recommendations  Rolling walker with 5" wheels(Pt wanting to try with her rollator but discussed TDWB  and may need RW for gait and stairs)    Recommendations for Other Services       Precautions / Restrictions Precautions Precautions: Fall Restrictions Weight Bearing Restrictions: Yes LLE Weight Bearing: Touchdown weight bearing      Mobility  Bed Mobility Overal bed mobility: Needs Assistance Bed Mobility: Supine to Sit;Sit to Supine     Supine to sit: Min guard Sit to supine: Min guard   General bed mobility comments: cues for safety with lines/leads  Transfers Overall transfer level: Needs assistance Equipment used: Rolling  walker (2 wheeled) Transfers: Sit to/from Stand Sit to Stand: Min guard         General transfer comment: Upon initial stand pt had LOB and returned to sitting controlled; Improved balance with second attempt; cued for safe hand placement  Ambulation/Gait Ambulation/Gait assistance: Min guard Gait Distance (Feet): 35 Feet Assistive device: Rolling walker (2 wheeled)   Gait velocity: decreased   General Gait Details: Pt hopped on R LE with short strides and good use of RW.  Cued for TDWB but pt declined and maintained NWB without difficulty.  Min guard for steadying.  Stairs            Wheelchair Mobility    Modified Rankin (Stroke Patients Only)       Balance Overall balance assessment: Needs assistance Sitting-balance support: No upper extremity supported;Feet supported Sitting balance-Leahy Scale: Normal     Standing balance support: Bilateral upper extremity supported;During functional activity Standing balance-Leahy Scale: Poor Standing balance comment: required use of RW                             Pertinent Vitals/Pain Pain Assessment: 0-10 Pain Score: 9  Pain Location: L foot Pain Descriptors / Indicators: Sharp Pain Intervention(s): Limited activity within patient's tolerance;Premedicated before session    Home Living Family/patient expects to be discharged to:: Private residence Living Arrangements: Children(daughter) Available Help at Discharge: Family;Available 24 hours/day Type of Home: House(duplex) Home Access: Stairs to enter Entrance Stairs-Rails: None Entrance Stairs-Number of Steps: 2 Home  Layout: One level Home Equipment: Shower seat;Bedside commode;Walker - 4 wheels      Prior Function Level of Independence: Independent with assistive device(s)         Comments: Pt reports independent with ADLs and IADLs (modified); community ambulation with rollator     Hand Dominance        Extremity/Trunk Assessment    Upper Extremity Assessment Upper Extremity Assessment: Overall WFL for tasks assessed    Lower Extremity Assessment Lower Extremity Assessment: LLE deficits/detail;RLE deficits/detail RLE Deficits / Details: Overall WFL LLE Deficits / Details: Demonstrating MMT 3/5 throughout but not further tested due to pain; ROM WFL    Cervical / Trunk Assessment Cervical / Trunk Assessment: Normal  Communication   Communication: No difficulties  Cognition Arousal/Alertness: Awake/alert Behavior During Therapy: WFL for tasks assessed/performed Overall Cognitive Status: Within Functional Limits for tasks assessed                                 General Comments: Pt agreeable to PT after encouragement and education on importance of OOB activity.   Pt reporting use of rollator at home and that she can sit down on it and roll at times as needed.  Also, reports she has used rollator as knee scooter in past.  Discussed that if she wanted to be up and walking with TDWB status recommend RW to allow for improved WBing in arms.  Also discussed may need RW for stairs at home.  Pt agreeable to trying RW and DME options for home.        General Comments General comments (skin integrity, edema, etc.): VSS    Exercises     Assessment/Plan    PT Assessment Patient needs continued PT services  PT Problem List Decreased mobility;Decreased strength;Decreased safety awareness;Decreased range of motion;Decreased knowledge of precautions;Decreased activity tolerance;Decreased balance;Decreased knowledge of use of DME;Pain       PT Treatment Interventions DME instruction;Therapeutic activities;Modalities;Gait training;Therapeutic exercise;Patient/family education;Stair training;Balance training;Functional mobility training    PT Goals (Current goals can be found in the Care Plan section)  Acute Rehab PT Goals Patient Stated Goal: return home; return to normal function PT Goal Formulation: With  patient Time For Goal Achievement: 12/07/19 Potential to Achieve Goals: Good    Frequency Min 3X/week   Barriers to discharge        Co-evaluation               AM-PAC PT "6 Clicks" Mobility  Outcome Measure Help needed turning from your back to your side while in a flat bed without using bedrails?: None Help needed moving from lying on your back to sitting on the side of a flat bed without using bedrails?: None Help needed moving to and from a bed to a chair (including a wheelchair)?: A Little Help needed standing up from a chair using your arms (e.g., wheelchair or bedside chair)?: A Little Help needed to walk in hospital room?: A Little Help needed climbing 3-5 steps with a railing? : A Lot 6 Click Score: 19    End of Session Equipment Utilized During Treatment: Gait belt Activity Tolerance: Patient tolerated treatment well Patient left: in bed;with call bell/phone within reach;with bed alarm set(foot elevated) Nurse Communication: Mobility status PT Visit Diagnosis: Unsteadiness on feet (R26.81);Pain Pain - Right/Left: Left Pain - part of body: Leg    Time: 1555-1620 PT Time Calculation (min) (ACUTE ONLY): 25 min  Charges:   PT Evaluation $PT Eval Low Complexity: 1 Low PT Treatments $Gait Training: 8-22 mins        Royetta Asal, PT Acute Rehab Services Pager [phone] [facility] Rehab [phone] [facility] [phone]   Rayetta Humphrey 11/23/2019, 4:41 PM

## 2019-11-23 NOTE — Anesthesia Postprocedure Evaluation (Signed)
Anesthesia Post Note  Patient: Karen Williams  Procedure(s) Performed: LEFT FOOT DEBRIDEMENT AND REMOVE HARDWARE (Left )     Patient location during evaluation: PACU Anesthesia Type: General Level of consciousness: awake and alert Pain management: pain level controlled Vital Signs Assessment: post-procedure vital signs reviewed and stable Respiratory status: spontaneous breathing, nonlabored ventilation, respiratory function stable and patient connected to nasal cannula oxygen Cardiovascular status: blood pressure returned to baseline and stable Postop Assessment: no apparent nausea or vomiting Anesthetic complications: no    Last Vitals:  Vitals:   11/23/19 1000 11/23/19 1017  BP:  (!) 151/85  Pulse: 82 83  Resp: 16 15  Temp:  36.5 C  SpO2: 100% 92%    Last Pain:  Vitals:   11/23/19 1240  TempSrc:   PainSc: 10-Worst pain ever                 Trevor Iha

## 2019-11-23 NOTE — Progress Notes (Signed)
PROGRESS NOTE    Karen Williams  FIE:[id number] DOB: [birth year] DOA: 11/17/2019 PCP: Patient, No Pcp Per   Brief Narrative:  69 y.o.BF PMHx DM type II controlled without complication,managed via lifestyle modifications, HLD, seizure disorder, anxiety, chronic iron deficiency anemia, recent left foot bunionectomy.   Presented secondary to left foot pain and found to have worsening cellulitis.  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 Purulent drainage noted. Concern for abscess but also would be concerning for osteomyelitis. Started on empiric Clindamycin on admission. Patient with recurrent emesis which she has issues with as an outpatient as well. MRI obtained and with no evidence of abscess or osteomyelitis; some reactivity noted attributed to recent bunionectomy (early osteo cannot be excluded). Blood cultures no growth to date. - 3/24 - s/p L foot debridement with partial excision of the first and second metatarsal, remove deep retained hardware, placement of 13 cm prevent wound vac, local tissue rearrangement for 2 wound closures - Follow 3/24 surgical cultures -Continue Clindamycin IV and Cefepime(cefepime added 3/21 secondary to continued significant pain and tenderness) -significant post op pain, will try dilaudid IV -Orthopedic surgeryrecommendations:surgery planned for 3/24 at [place] care recommendations: Wash left foot wound with soap and water. Pat dry. Place small pieces of Aquacel Kellie Simmering [phone]) over the dorsal foot wound areas, cover with dry gauze, secure with kerlex.   DM type II controlled with complication -0/16 hemoglobin A1c= 6.8 -Diet controlled -Sensitive SSI   Iron deficiency anemia -Patient is on iron supplementation. Will hold for nowin setting of infection  Left knee pain -No complaints today   Hyperlipidemia -Lipid panel pending -Simvastatin 40 mg daily  Seizure disorder -Lamictal 100 mg BID  Anxiety -Clonazepam 0.5 mg BID  Headache Appears to be tension type. Possibly related to lack of caffeine. Patient reports Deontaye Civello history of migraines. No associated neurological issues.Improved with Fioricet and more definitevely with decadron, Toradol, Benadryl. CT head unremarkable for acute cause. -ContinueRobaxinprn  DVT prophylaxis: lovenox Code Status: full Family Communication: none at bedside Disposition Plan:  . Patient came from: home?            Marland Kitchen Anticipated d/c place: pending eval by therapy . Barriers to d/c OR conditions which need to be met to effect Prabhleen Montemayor safe d/c: pending cultures and eval by orthopedics  Consultants:   orthopedics  Procedures:  3/24 - s/p L foot debridement with partial excision of the first and second metatarsal, remove deep retained hardware, placement of 13 cm prevent wound vac, local tissue rearrangement for 2 wound closures  Antimicrobials:  Anti-infectives (From admission, onward)   Start     Dose/Rate Route Frequency Ordered Stop   11/23/19 0730  ceFAZolin (ANCEF) IVPB 2g/100 mL premix  Status:  Discontinued     2 g 200 mL/hr over 30 Minutes Intravenous On call to O.R. 11/23/19 0724 11/23/19 1015   11/20/19 1200  ceFEPIme (MAXIPIME) 2 g in sodium chloride 0.9 % 100 mL IVPB     2 g 200 mL/hr over 30 Minutes Intravenous Every 8 hours 11/20/19 1146  11/18/19 0800  clindamycin (CLEOCIN) IVPB 600 mg    Note to Pharmacy: Okay for pharmacy to adjust dose.   600 mg 100 mL/hr over 30 Minutes Intravenous Every 8 hours 11/18/19 0235     11/17/19 2045  clindamycin (CLEOCIN) IVPB 900 mg      900 mg 100 mL/hr over 30 Minutes Intravenous  Once 11/17/19 2044 11/18/19 0006     Subjective: Uncomfortable, crying in pain post op  Objective: Vitals:   11/23/19 0945 11/23/19 0950 11/23/19 1000 11/23/19 1017  BP:  136/71  (!) 151/85  Pulse: 80 83 82 83  Resp: 12 13 16 15   Temp:    97.7 F (36.5 C)  TempSrc:    Oral  SpO2: 100% 100% 100% 92%  Weight:      Height:        Intake/Output Summary (Last 24 hours) at 11/23/2019 1334 Last data filed at 11/23/2019 0904 Gross per 24 hour  Intake -  Output 20 ml  Net -20 ml   Filed Weights   11/20/19 0608 11/21/19 0542 11/23/19 0734  Weight: 67.9 kg 65.1 kg 65.1 kg    Examination:  General exam: Appears uncomfortable, crying in pain Respiratory system: Clear to auscultation. Respiratory effort normal. Cardiovascular system: S1 & S2 heard, RRR. Gastrointestinal system: Abdomen is nondistended, soft and nontender.  Central nervous system: Alert and oriented. No focal neurological deficits. Extremities: LLE with wound vac in place Skin: No rashes, lesions or ulcers   Data Reviewed: I have personally reviewed following labs and imaging studies  CBC: Recent Labs  Lab 11/17/19 2250 11/18/19 0530 11/19/19 0503 11/22/19 1507 11/23/19 0419  WBC 6.7 6.7 6.0 7.0 6.2  NEUTROABS 4.2 4.2  --  4.3  --   HGB 9.8* 9.8* 10.3* 10.5* 11.2*  HCT 32.8* 32.6* 33.3* 33.9* 35.7*  MCV 94.0 94.8 91.7 91.6 90.2  PLT 249 239 263 286 161   Basic Metabolic Panel: Recent Labs  Lab 11/17/19 2250 11/18/19 0530 11/19/19 0503 11/22/19 1507 11/23/19 0419  NA 144 140 139 138 140  K 3.9 3.4* 4.1 4.5 4.6  CL 112* 109 103 102 103  CO2 24 23 30 28 29   GLUCOSE 98 111* 113* 113* 96  BUN 12 10 8 13  7*  CREATININE 0.62 0.67 0.60 0.89 0.74  CALCIUM 8.7* 8.4* 8.8* 8.5* 8.6*  MG  --  1.8  --  1.9 2.0  PHOS  --   --   --  3.9 4.0   GFR: Estimated Creatinine Clearance: 65.5 mL/min (by C-G formula based on SCr of 0.74 mg/dL). Liver Function Tests:  Recent Labs  Lab 11/17/19 2250 11/22/19 1507 11/23/19 0419  AST 17 22 19   ALT 14 23 22   ALKPHOS 87 105 105  BILITOT 0.4 0.5 0.5  PROT 6.8 6.4* 6.2*  ALBUMIN 3.3* 3.0* 2.9*   No results for input(s): LIPASE, AMYLASE in the last 168 hours. No results for input(s): AMMONIA in the last 168 hours. Coagulation Profile: No results for input(s): INR, PROTIME in the last 168 hours. Cardiac Enzymes: No results for input(s): CKTOTAL, CKMB, CKMBINDEX, TROPONINI in the last 168 hours. BNP (last 3 results) No results for input(s): PROBNP in the last 8760 hours. HbA1C: No results for input(s): HGBA1C in the last 72 hours. CBG: Recent Labs  Lab 11/22/19 2058 11/23/19 0654 11/23/19 0703 11/23/19 0906 11/23/19 1110  GLUCAP 96 95 108* 93 126*   Lipid Profile: Recent Labs    11/23/19 0419  CHOL 159  HDL 50  LDLCALC 92  TRIG 87  CHOLHDL 3.2   Thyroid Function Tests: No results for input(s): TSH, T4TOTAL, FREET4, T3FREE, THYROIDAB in the last 72 hours. Anemia Panel: No results for input(s): VITAMINB12, FOLATE, FERRITIN, TIBC, IRON, RETICCTPCT in the last 72 hours. Sepsis Labs: Recent Labs  Lab 11/17/19 2250 11/22/19 1507 11/22/19 1844 11/23/19 0419  PROCALCITON  --  <0.10  --  <0.10  LATICACIDVEN 0.8 0.9 0.9  --     Recent Results (from the past 240 hour(s))  SARS CORONAVIRUS 2 (TAT 6-24 HRS) Nasopharyngeal Nasopharyngeal Swab     Status: None   Collection Time: 11/17/19 10:00 PM   Specimen: Nasopharyngeal Swab  Result Value Ref Range Status   SARS Coronavirus 2 NEGATIVE NEGATIVE Final    Comment: (NOTE) SARS-CoV-2 target nucleic acids are NOT DETECTED. The SARS-CoV-2 RNA is generally detectable in upper and lower respiratory specimens during the acute phase of infection. Negative results do not preclude SARS-CoV-2 infection, do not rule out co-infections with other pathogens, and should not be used as the sole basis for treatment or other patient management decisions.  Negative results must be combined with clinical observations, patient history, and epidemiological information. The expected result is Negative. Fact Sheet for Patients: SugarRoll.be Fact Sheet for Healthcare Providers: https://www.woods-mathews.com/ This test is not yet approved or cleared by the [place] FDA and  has been authorized for detection and/or diagnosis of SARS-CoV-2 by FDA under an Emergency Use Authorization (EUA). This EUA will remain  in effect (meaning this test can be used) for the duration of the COVID-19 declaration under Section 56 4(b)(1) of the Act, 21 U.S.C. section 360bbb-3(b)(1), unless the authorization is terminated or revoked sooner. Performed at [facility], [place] [address] [place], [place] [address]   Blood culture (routine x 2)     Status: None   Collection Time: 11/17/19 10:50 PM   Specimen: BLOOD  Result Value Ref Range Status   Specimen Description   Final    BLOOD RIGHT Performed at [facility] [address], [place], [place] [address]    Special Requests   Final    BOTTLES DRAWN AEROBIC AND ANAEROBIC Blood Culture results may not be optimal due to an excessive volume of blood received in culture bottles Performed at [facility] [address], [place], [place] [address]    Culture   Final    NO GROWTH 5 DAYS Performed at [facility], [place] [address], [place], [place] [address]    Report Status 11/23/2019 FINAL  Final  Surgical pcr screen     Status: Abnormal   Collection Time: 11/23/19  1:52 AM   Specimen: Nasal Mucosa; Nasal Swab  Result Value Ref Range Status   MRSA, PCR NEGATIVE NEGATIVE Final   Staphylococcus aureus POSITIVE (Vonn Sliger) NEGATIVE Final    Comment: (NOTE) The Xpert SA Assay (FDA approved for NASAL specimens in patients 4 years of age and older), is one component of Makeya Hilgert comprehensive surveillance program. It is not intended to  diagnose infection nor to guide or monitor treatment.   Aerobic/Anaerobic Culture (surgical/deep wound)     Status: None (Preliminary result)   Collection Time: 11/23/19  8:37 AM   Specimen: PATH Soft tissue  Result Value Ref Range Status   Specimen Description TISSUE  Final   Special Requests LEFT FOOT  Final   Gram Stain   Final    FEW WBC PRESENT, PREDOMINANTLY MONONUCLEAR NO ORGANISMS  SEEN Performed at [facility], [place] [address] [place], [place] [address]    Culture PENDING  Incomplete   Report Status PENDING  Incomplete         Radiology Studies: No results found.      Scheduled Meds: . amitriptyline  25 mg Oral QHS  . docusate sodium  100 mg Oral BID  . enoxaparin (LOVENOX) injection  40 mg Subcutaneous Q24H  . ferrous sulfate  325 mg Oral QPM  . HYDROmorphone      . HYDROmorphone      . insulin aspart  0-9 Units Subcutaneous TID WC  . lamoTRIgine  100 mg Oral BID  . oxyCODONE      . simvastatin  40 mg Oral q1800  . sodium chloride flush  10-40 mL Intracatheter Q12H  . sodium chloride flush  3 mL Intravenous Q12H   Continuous Infusions: . sodium chloride 75 mL/hr at 11/23/19 1032  . ceFEPime (MAXIPIME) IV 2 g (11/23/19 1253)  . clindamycin (CLEOCIN) IV 600 mg (11/23/19 0044)  . lactated ringers 400 mL/hr at 11/23/19 0905     LOS: 5 days    Time spent: over 30 min    Fayrene Helper, MD [facility]   To contact the attending provider between 7A-7P or the covering provider during after hours 7P-7A, please log into the web site [email or web address] and access using universal [place] password for that web site. If you do not have the password, please call the hospital operator.  11/23/2019, 1:34 PM

## 2019-11-23 NOTE — Anesthesia Procedure Notes (Signed)
Procedure Name: LMA Insertion Date/Time: 11/23/2019 8:31 AM Performed by: Tillman Abide, CRNA Pre-anesthesia Checklist: Patient identified, Emergency Drugs available, Suction available and Patient being monitored Patient Re-evaluated:Patient Re-evaluated prior to induction Oxygen Delivery Method: Circle System Utilized Preoxygenation: Pre-oxygenation with 100% oxygen Induction Type: IV induction Ventilation: Mask ventilation without difficulty LMA: LMA inserted LMA Size: 4.0 Number of attempts: 1 Placement Confirmation: positive ETCO2 Tube secured with: Tape Dental Injury: Teeth and Oropharynx as per pre-operative assessment

## 2019-11-23 NOTE — Transfer of Care (Signed)
Immediate Anesthesia Transfer of Care Note  Patient: Karen Williams  Procedure(s) Performed: LEFT FOOT DEBRIDEMENT AND REMOVE HARDWARE (Left )  Patient Location: PACU  Anesthesia Type:General  Level of Consciousness: awake, alert , oriented and patient cooperative  Airway & Oxygen Therapy: Patient Spontanous Breathing and Patient connected to face mask oxygen  Post-op Assessment: Report given to RN and Post -op Vital signs reviewed and stable  Post vital signs: Reviewed and stable  Last Vitals:  Vitals Value Taken Time  BP    Temp    Pulse    Resp 20 11/23/19 0904  SpO2    Vitals shown include unvalidated device data.  Last Pain:  Vitals:   11/23/19 0554  TempSrc: Oral  PainSc:       Patients Stated Pain Goal: 3 (11/22/19 0420)  Complications: No apparent anesthesia complications

## 2019-11-23 NOTE — Plan of Care (Signed)
  Problem: Clinical Measurements: Goal: Ability to maintain clinical measurements within normal limits will improve Outcome: Progressing   Problem: Clinical Measurements: Goal: Ability to maintain clinical measurements within normal limits will improve Outcome: Progressing   Problem: Activity: Goal: Risk for activity intolerance will decrease Outcome: Progressing   Problem: Nutrition: Goal: Adequate nutrition will be maintained Outcome: Progressing   Problem: Coping: Goal: Level of anxiety will decrease Outcome: Progressing   Problem: Elimination: Goal: Will not experience complications related to bowel motility Outcome: Progressing

## 2019-11-23 NOTE — Progress Notes (Signed)
PT Cancellation Note  Patient Details Name: Karen Williams MRN: [id number] DOB: [birth year]   Cancelled Treatment:    Reason Eval/Treat Not Completed: Pain limiting ability to participate   Spoke with RN, pt just back from surgery in currently in a lot of pain. Agreed will attempt to see later today once pain better controlled.    Jerolyn Center, PT Pager [phone]    Zena Amos 11/23/2019, 10:33 AM

## 2019-11-23 NOTE — Plan of Care (Signed)
  Problem: Education: Goal: Knowledge of General Education information will improve Description: Including pain rating scale, medication(s)/side effects and non-pharmacologic comfort measures Outcome: Progressing   Problem: Activity: Goal: Risk for activity intolerance will decrease Outcome: Progressing   Problem: Nutrition: Goal: Adequate nutrition will be maintained Outcome: Progressing   Problem: Coping: Goal: Level of anxiety will decrease Outcome: Progressing   

## 2019-11-24 LAB — COMPREHENSIVE METABOLIC PANEL
ALT: 31 U/L (ref 0–44)
AST: 41 U/L (ref 15–41)
Albumin: 3.1 g/dL — ABNORMAL LOW (ref 3.5–5.0)
Alkaline Phosphatase: 111 U/L (ref 38–126)
Anion gap: 9 (ref 5–15)
BUN: 8 mg/dL (ref 8–23)
CO2: 29 mmol/L (ref 22–32)
Calcium: 9.3 mg/dL (ref 8.9–10.3)
Chloride: 98 mmol/L (ref 98–111)
Creatinine, Ser: 0.61 mg/dL (ref 0.44–1.00)
GFR calc Af Amer: >60 mL/min (ref >60)
GFR calc non Af Amer: >60 mL/min (ref >60)
Glucose, Bld: 130 mg/dL — ABNORMAL HIGH (ref 70–99)
Potassium: 4.3 mmol/L (ref 3.5–5.1)
Sodium: 136 mmol/L (ref 135–145)
Total Bilirubin: 0.5 mg/dL (ref 0.3–1.2)
Total Protein: 6.7 g/dL (ref 6.5–8.1)

## 2019-11-24 LAB — CBC
HCT: 34.1 % — ABNORMAL LOW (ref 36.0–46.0)
Hemoglobin: 10.7 g/dL — ABNORMAL LOW (ref 12.0–15.0)
MCH: 28.6 pg (ref 26.0–34.0)
MCHC: 31.4 g/dL (ref 30.0–36.0)
MCV: 91.2 fL (ref 80.0–100.0)
Platelets: 307 10*3/uL (ref 150–400)
RBC: 3.74 MIL/uL — ABNORMAL LOW (ref 3.87–5.11)
RDW: 16.7 % — ABNORMAL HIGH (ref 11.5–15.5)
WBC: 8.8 10*3/uL (ref 4.0–10.5)
nRBC: 0 % (ref 0.0–0.2)

## 2019-11-24 LAB — PHOSPHORUS: Phosphorus: 3.6 mg/dL (ref 2.5–4.6)

## 2019-11-24 LAB — GLUCOSE, CAPILLARY
Glucose-Capillary: 129 mg/dL — ABNORMAL HIGH (ref 70–99)
Glucose-Capillary: 87 mg/dL (ref 70–99)
Glucose-Capillary: 116 mg/dL — ABNORMAL HIGH (ref 70–99)
Glucose-Capillary: 133 mg/dL — ABNORMAL HIGH (ref 70–99)

## 2019-11-24 LAB — MAGNESIUM: Magnesium: 1.7 mg/dL (ref 1.7–2.4)

## 2019-11-24 LAB — PROCALCITONIN: Procalcitonin: <0.1 ng/mL

## 2019-11-24 MED ORDER — IBUPROFEN 200 MG PO TABS
400.0000 mg | ORAL_TABLET | Freq: Three times a day (TID) | ORAL | Status: DC
  Administered 2019-11-24 – 2019-11-25 (×4): 400 mg via ORAL
  Filled 2019-11-24 (×4): qty 2

## 2019-11-24 MED ORDER — PANTOPRAZOLE SODIUM 40 MG PO TBEC
40.0000 mg | DELAYED_RELEASE_TABLET | Freq: Every day | ORAL | Status: DC
  Administered 2019-11-24 – 2019-11-25 (×2): 40 mg via ORAL
  Filled 2019-11-24 (×2): qty 1

## 2019-11-24 NOTE — Evaluation (Signed)
Occupational Therapy Evaluation Patient Details Name: Karen Williams MRN: [id number] DOB: 11/30/1950 Today's Date: 11/24/2019    History of Present Illness Pt is 69 yo female with PMH including DM II, HLD, seizure disorder, anxiety, anemia, and recent L foot bunionectomy.  Pt was admitted with cellulitis and post op infection L foot.  She is s/p L foot debridement with partial excision of 1st and 2nd metatarsal and removal of hardware on 11/23/19.   Clinical Impression   Pt admitted with above. She demonstrates the below listed deficits and will benefit from continued OT to maximize safety and independence with BADLs.  Pt currently requires min A for ADLs and functional mobility.  She reports she was mod I PTA, and will be staying with her daughter at discharge, who can assist as needed.  Anticipate good progress.  Will follow acutely.       Follow Up Recommendations  No OT follow up;Supervision - Intermittent    Equipment Recommendations  None recommended by OT    Recommendations for Other Services       Precautions / Restrictions Precautions Precautions: Fall Restrictions Weight Bearing Restrictions: Yes LLE Weight Bearing: Touchdown weight bearing      Mobility Bed Mobility Overal bed mobility: Needs Assistance Bed Mobility: Supine to Sit;Sit to Supine     Supine to sit: Min guard Sit to supine: Min guard   General bed mobility comments: v  Transfers Overall transfer level: Needs assistance Equipment used: Rolling walker (2 wheeled) Transfers: Sit to/from Stand Sit to Stand: Min guard         General transfer comment: Pt requires min guard assist for safety     Balance Overall balance assessment: Needs assistance Sitting-balance support: No upper extremity supported;Feet supported Sitting balance-Leahy Scale: Normal     Standing balance support: Bilateral upper extremity supported;During functional activity Standing balance-Leahy Scale: Fair Standing  balance comment: required unilateral UE support                            ADL either performed or assessed with clinical judgement   ADL Overall ADL's : Needs assistance/impaired Eating/Feeding: Independent   Grooming: Wash/dry hands;Wash/dry face;Oral care;Brushing hair;Minimal assistance;Standing   Upper Body Bathing: Set up;Sitting   Lower Body Bathing: Minimal assistance;Sit to/from stand   Upper Body Dressing : Set up;Sitting   Lower Body Dressing: Minimal assistance;Sit to/from stand Lower Body Dressing Details (indicate cue type and reason): assist to pull pants over Lt foot, and min guard assist for standing  Toilet Transfer: Minimal assistance;Ambulation;BSC;RW   Toileting- Architect and Hygiene: Min guard;Sit to/from stand       Functional mobility during ADLs: Min guard;Minimal assistance;Rolling walker       Vision         Perception     Praxis      Pertinent Vitals/Pain Pain Assessment: Faces Faces Pain Scale: Hurts little more Pain Location: L foot Pain Descriptors / Indicators: Sharp Pain Intervention(s): Monitored during session     Hand Dominance     Extremity/Trunk Assessment Upper Extremity Assessment Upper Extremity Assessment: Overall WFL for tasks assessed   Lower Extremity Assessment Lower Extremity Assessment: Defer to PT evaluation       Communication Communication Communication: No difficulties   Cognition Arousal/Alertness: Awake/alert Behavior During Therapy: WFL for tasks assessed/performed Overall Cognitive Status: Within Functional Limits for tasks assessed  General Comments: Pt agreeable to PT, stating she has worked with therapy a lot in the past and feels very capable to return home currently. States she wil be able to performs steps safely with quad cane and family assistance.   General Comments  Pt instructed in TDWB precautions and safety with ADLs      Exercises     Shoulder Instructions      Home Living Family/patient expects to be discharged to:: Private residence Living Arrangements: Children Available Help at Discharge: Family;Available 24 hours/day Type of Home: House Home Access: Stairs to enter [employer] of Steps: 2 Entrance Stairs-Rails: None Home Layout: One level     Bathroom Shower/Tub: Teacher, early years/pre: Standard     Home Equipment: Shower seat;Bedside commode;Walker - 4 wheels   Additional Comments: Pt reports she lives in [place], but is currently staying with her daughter, who is her "nurse"       Prior Functioning/Environment Level of Independence: Independent with assistive device(s)        Comments: Pt reports independent with ADLs and IADLs (modified); community ambulation with rollator        OT Problem List: Decreased strength;Decreased activity tolerance;Impaired balance (sitting and/or standing);Decreased knowledge of use of DME or AE;Decreased knowledge of precautions;Pain      OT Treatment/Interventions: Self-care/ADL training;Therapeutic exercise;Therapeutic activities;Patient/family education;Balance training;DME and/or AE instruction    OT Goals(Current goals can be found in the care plan section) Acute Rehab OT Goals Patient Stated Goal: can I go home today?  OT Goal Formulation: With patient Time For Goal Achievement: 12/08/19 Potential to Achieve Goals: Good ADL Goals Pt Will Perform Grooming: with supervision;standing Pt Will Perform Lower Body Bathing: with supervision;sit to/from stand Pt Will Perform Lower Body Dressing: with adaptive equipment;sit to/from stand;with supervision Pt Will Transfer to Toilet: with supervision;ambulating;regular height toilet;bedside commode;grab bars Pt Will Perform Toileting - Clothing Manipulation and hygiene: with supervision;sit to/from stand Pt/caregiver will Perform Home Exercise Program: Increased strength;Both  right and left upper extremity;With theraband;Independently  OT Frequency: Min 2X/week   Barriers to D/C:            Co-evaluation              AM-PAC OT "6 Clicks" Daily Activity     Outcome Measure Help from another person eating meals?: None Help from another person taking care of personal grooming?: A Little Help from another person toileting, which includes using toliet, bedpan, or urinal?: A Little Help from another person bathing (including washing, rinsing, drying)?: A Little Help from another person to put on and taking off regular upper body clothing?: A Little Help from another person to put on and taking off regular lower body clothing?: A Little 6 Click Score: 19   End of Session Equipment Utilized During Treatment: Rolling walker Nurse Communication: Mobility status  Activity Tolerance: Patient tolerated treatment well Patient left: in bed;with call bell/phone within reach;with bed alarm set  OT Visit Diagnosis: Unsteadiness on feet (R26.81);Pain Pain - Right/Left: Left Pain - part of body: Ankle and joints of foot                Time: 1142-1155 OT Time Calculation (min): 13 min Charges:  OT General Charges $OT Visit: 1 Visit OT Evaluation $OT Eval Moderate Complexity: 1 Mod  Nilsa Nutting., OTR/L Acute Rehabilitation Services Pager [phone] Office [place], [place] 11/24/2019, 3:38 PM

## 2019-11-24 NOTE — Progress Notes (Signed)
The patient is postop day 1 status post left foot debridement with partial excision of first and second metatarsal.  Cultures are currently pending.  She is in bed today comfortable.  Wound VAC is in place and functioning.  0 cc in the canister.  Plan would be to follow cultures and adjust antibiotics as needed.  She will need a 1 week follow-up in our office to remove the wound VAC

## 2019-11-24 NOTE — Progress Notes (Signed)
Occupational Therapy Progress Note  Pt provided with level 2 theraband and was instructed in HEP for bil. UEs  which she performed independently.    Will continue to follow.     11/24/19 1700  OT Visit Information  Last OT Received On 11/24/19  Assistance Needed +1  History of Present Illness Pt is 69 yo female with PMH including DM II, HLD, seizure disorder, anxiety, anemia, and recent L foot bunionectomy.  Pt was admitted with cellulitis and post op infection L foot.  She is s/p L foot debridement with partial excision of 1st and 2nd metatarsal and removal of hardware on 11/23/19.  Precautions  Precautions Fall  Pain Assessment  Pain Assessment Faces  Faces Pain Scale 4  Pain Location L foot  Pain Descriptors / Indicators Sharp  Pain Intervention(s) Monitored during session  Cognition  Arousal/Alertness Awake/alert  Behavior During Therapy WFL for tasks assessed/performed  Overall Cognitive Status Within Functional Limits for tasks assessed  General Comments Pt agreeable to PT, stating she has worked with therapy a lot in the past and feels very capable to return home currently. States she wil be able to performs steps safely with quad cane and family assistance.  Upper Extremity Assessment  Upper Extremity Assessment Overall WFL for tasks assessed  Lower Extremity Assessment  Lower Extremity Assessment Overall WFL for tasks assessed;Defer to PT evaluation  Restrictions  Weight Bearing Restrictions Yes  LLE Weight Bearing TWB  Exercises  Exercises Other exercises  Other Exercises  Other Exercises Pt provided with level 2 theraband.  She performed 10 reps bil. horizontal abduction as well as diagonals x 2 folled by elbow extension and resisted shoulder flexion 10x each   OT - End of Session  Activity Tolerance Patient tolerated treatment well  Patient left in bed;with call bell/phone within reach;with bed alarm set  Nurse Communication Mobility status  OT Assessment/Plan  OT  Plan Discharge plan remains appropriate  OT Visit Diagnosis Unsteadiness on feet (R26.81);Pain  Pain - Right/Left Left  Pain - part of body Ankle and joints of foot  OT Frequency (ACUTE ONLY) Min 2X/week  Follow Up Recommendations No OT follow up;Supervision - Intermittent  OT Equipment None recommended by OT  AM-PAC OT "6 Clicks" Daily Activity Outcome Measure (Version 2)  Help from another person eating meals? 4  Help from another person taking care of personal grooming? 3  Help from another person toileting, which includes using toliet, bedpan, or urinal? 3  Help from another person bathing (including washing, rinsing, drying)? 3  Help from another person to put on and taking off regular upper body clothing? 3  Help from another person to put on and taking off regular lower body clothing? 3  6 Click Score 19  OT Goal Progression  Progress towards OT goals Progressing toward goals  OT Time Calculation  OT Start Time (ACUTE ONLY) 1539  OT Stop Time (ACUTE ONLY) 1547  OT Time Calculation (min) 8 min  OT General Charges  $OT Visit 1 Visit  OT Treatments  $Therapeutic Exercise 8-22 mins  Eber Jones., OTR/L Acute Rehabilitation Services Pager [phone] Office [phone]

## 2019-11-24 NOTE — Plan of Care (Signed)
  Problem: Clinical Measurements: Goal: Ability to maintain clinical measurements within normal limits will improve Outcome: Progressing   Problem: Education: Goal: Knowledge of General Education information will improve Description: Including pain rating scale, medication(s)/side effects and non-pharmacologic comfort measures Outcome: Progressing   Problem: Health Behavior/Discharge Planning: Goal: Ability to manage health-related needs will improve Outcome: Progressing   Problem: Clinical Measurements: Goal: Ability to maintain clinical measurements within normal limits will improve Outcome: Progressing   Problem: Activity: Goal: Risk for activity intolerance will decrease Outcome: Progressing   Problem: Nutrition: Goal: Adequate nutrition will be maintained Outcome: Progressing   Problem: Coping: Goal: Level of anxiety will decrease Outcome: Progressing   Problem: Elimination: Goal: Will not experience complications related to bowel motility Outcome: Progressing   Problem: Pain Managment: Goal: General experience of comfort will improve Outcome: Progressing   Problem: Safety: Goal: Ability to remain free from injury will improve Outcome: Progressing   Problem: Skin Integrity: Goal: Risk for impaired skin integrity will decrease Outcome: Progressing   

## 2019-11-24 NOTE — Progress Notes (Signed)
PROGRESS NOTE    Karen Williams  ZOX:[id number] DOB: 1951/02/26 DOA: 11/17/2019 PCP: Patient, No Pcp Per   Brief Narrative:  69 y.o.BF PMHx DM type II controlled without complication,managed via lifestyle modifications, HLD, seizure disorder, anxiety, chronic iron deficiency anemia, recent left foot bunionectomy.   Presented secondary to left foot pain and found to have worsening cellulitis.  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 Purulent drainage noted. Concern for abscess but also would be concerning for osteomyelitis. Started on empiric Clindamycin on admission. Patient with recurrent emesis which she has issues with as an outpatient as well. MRI obtained and with no evidence of abscess or osteomyelitis; some reactivity noted attributed to recent bunionectomy (early osteo cannot be excluded). Blood cultures no growth to date. - 3/24 - s/p L foot debridement with partial excision of the first and second metatarsal, remove deep retained hardware, placement of 13 cm prevent wound vac, local tissue rearrangement for 2 wound closures - Follow 3/24 surgical cultures -> few staph aureus - follow sensitivities -Continue Clindamycin IV and Cefepime(cefepime added 3/21 secondary to continued significant pain and tenderness) - pain improved, but still present - ibuprofen, oxycodone, dilaudid for breakthrough - appreciate ortho recs  DM type II controlled with complication -8/11 hemoglobin A1c= 6.8 - bg's reasonable today -Diet controlled -Sensitive SSI   Iron deficiency anemia -Patient is on iron  supplementation. Will hold for nowin setting of infection  Left knee pain -No complaints today   Hyperlipidemia -Lipid panel pending -Simvastatin 40 mg daily  Seizure disorder -Lamictal 100 mg BID  Anxiety -Clonazepam 0.5 mg BID  Headache - no c/o HA  DVT prophylaxis: lovenox Code Status: full Family Communication: none at bedside Disposition Plan:  . Patient came from: home?            Marland Kitchen Anticipated d/c place: pending eval by therapy . Barriers to d/c OR conditions which need to be met to effect a safe d/c: pending cultures and eval by orthopedics  Consultants:   orthopedics  Procedures:  3/24 - s/p L foot debridement with partial excision of the first and second metatarsal, remove deep retained hardware, placement of 13 cm prevent wound vac, local tissue rearrangement for 2 wound closures  Antimicrobials:  Anti-infectives (From admission, onward)   Start     Dose/Rate Route Frequency Ordered Stop   11/23/19 0730  ceFAZolin (ANCEF) IVPB 2g/100 mL premix  Status:  Discontinued     2 g 200 mL/hr over 30 Minutes Intravenous On call to O.R. 11/23/19 0724 11/23/19 1015   11/20/19 1200  ceFEPIme (MAXIPIME) 2 g in sodium chloride 0.9 % 100 mL IVPB     2 g 200 mL/hr over 30 Minutes Intravenous Every 8 hours 11/20/19 1146     11/18/19 0800  clindamycin (CLEOCIN) IVPB 600 mg    Note to Pharmacy: Okay for pharmacy to adjust dose.   600 mg 100 mL/hr over 30 Minutes Intravenous Every 8 hours 11/18/19 0235     11/17/19 2045  clindamycin (CLEOCIN) IVPB 900 mg     900 mg 100 mL/hr  over 30 Minutes Intravenous  Once 11/17/19 2044 11/18/19 0006     Subjective: Continued L foot pain, but better than yesterday Looking forward to being able to go home  Objective: Vitals:   11/23/19 2106 11/24/19 0020 11/24/19 0423 11/24/19 0754  BP: 140/85 135/87 108/61 121/74  Pulse: 97 95 79 79  Resp: _0 Temp: 97.9 F (36.6 C) 97.8 F (36.6 C) 98.5 F (36.9 C) 98.3 F (36.8  C)  TempSrc: Oral Oral Oral Oral  SpO2: 100% 98% 99% 98%  Weight:      Height:        Intake/Output Summary (Last 24 hours) at 11/24/2019 1438 Last data filed at 11/24/2019 1000 Gross per 24 hour  Intake 1306.02 ml  Output 1500 ml  Net -193.98 ml   Filed Weights   11/20/19 0608 11/21/19 0542 11/23/19 0734  Weight: 67.9 kg 65.1 kg 65.1 kg    Examination:  General: No acute distress. Cardiovascular: Heart sounds show a regular rate, and rhythm.  Lungs: Clear to auscultation bilaterally Abdomen: Soft, nontender, nondistended Neurological: Alert and oriented 3. Moves all extremities 4. Cranial nerves II through XII grossly intact. Skin: Warm and dry. No rashes or lesions. Extremities:  LLE with wound vac in place    Data Reviewed: I have personally reviewed following labs and imaging studies  CBC: Recent Labs  Lab 11/17/19 2250 11/17/19 2250 11/18/19 0530 11/19/19 0503 11/22/19 1507 11/23/19 0419 11/24/19 0409  WBC 6.7   < > 6.7 6.0 7.0 6.2 8.8  NEUTROABS 4.2  --  4.2  --  4.3  --   --   HGB 9.8*   < > 9.8* 10.3* 10.5* 11.2* 10.7*  HCT 32.8*   < > 32.6* 33.3* 33.9* 35.7* 34.1*  MCV 94.0   < > 94.8 91.7 91.6 90.2 91.2  PLT 249   < > 239 263 286 305 307   < > = values in this interval not displayed.   Basic Metabolic Panel: Recent Labs  Lab 11/18/19 0530 11/19/19 0503 11/22/19 1507 11/23/19 0419 11/24/19 0409  NA 140 139 138 140 136  K 3.4* 4.1 4.5 4.6 4.3  CL 109 103 102 103 98  CO2 _1 GLUCOSE 111* 113* 113* 96 130*  BUN _2 7* 8  CREATININE 0.67 0.60 0.89 0.74 0.61  CALCIUM 8.4* 8.8* 8.5* 8.6* 9.3  MG 1.8  --  1.9 2.0 1.7  PHOS  --   --  3.9 4.0 3.6   GFR: Estimated Creatinine Clearance: 65.5 mL/min (by C-G formula based on SCr of 0.61 mg/dL). Liver Function Tests: Recent Labs  Lab 11/17/19 2250 11/22/19 1507 11/23/19 0419 11/24/19 0409  AST _3 41  ALT _4 ALKPHOS 87 105 105 111  BILITOT 0.4 0.5 0.5 0.5    PROT 6.8 6.4* 6.2* 6.7  ALBUMIN 3.3* 3.0* 2.9* 3.1*   No results for input(s): LIPASE, AMYLASE in the last 168 hours. No results for input(s): AMMONIA in the last 168 hours. Coagulation Profile: No results for input(s): INR, PROTIME in the last 168 hours. Cardiac Enzymes: No results for input(s): CKTOTAL, CKMB, CKMBINDEX, TROPONINI in the last 168 hours. BNP (last 3 results) No results for input(s): PROBNP in the last 8760 hours. HbA1C: No results for input(s): HGBA1C in the last 72 hours. CBG: Recent Labs  Lab 11/23/19 1110 11/23/19 1615 11/23/19 2100 11/24/19 0700 11/24/19 1132  GLUCAP 126* 158*  129* 129* 87   Lipid Profile: Recent Labs    11/23/19 0419  CHOL 159  HDL 50  LDLCALC 92  TRIG 87  CHOLHDL 3.2   Thyroid Function Tests: No results for input(s): TSH, T4TOTAL, FREET4, T3FREE, THYROIDAB in the last 72 hours. Anemia Panel: No results for input(s): VITAMINB12, FOLATE, FERRITIN, TIBC, IRON, RETICCTPCT in the last 72 hours. Sepsis Labs: Recent Labs  Lab 11/17/19 2250 11/22/19 1507 11/22/19 1844 11/23/19 0419 11/24/19 0409  PROCALCITON  --  <0.10  --  <0.10 <0.10  LATICACIDVEN 0.8 0.9 0.9  --   --     Recent Results (from the past 240 hour(s))  SARS CORONAVIRUS 2 (TAT 6-24 HRS) Nasopharyngeal Nasopharyngeal Swab     Status: None   Collection Time: 11/17/19 10:00 PM   Specimen: Nasopharyngeal Swab  Result Value Ref Range Status   SARS Coronavirus 2 NEGATIVE NEGATIVE Final    Comment: (NOTE) SARS-CoV-2 target nucleic acids are NOT DETECTED. The SARS-CoV-2 RNA is generally detectable in upper and lower respiratory specimens during the acute phase of infection. Negative results do not preclude SARS-CoV-2 infection, do not rule out co-infections with other pathogens, and should not be used as the sole basis for treatment or other patient management decisions. Negative results must be combined with clinical observations, patient history, and  epidemiological information. The expected result is Negative. Fact Sheet for Patients: SugarRoll.be Fact Sheet for Healthcare Providers: https://www.woods-mathews.com/ This test is not yet approved or cleared by the [place] FDA and  has been authorized for detection and/or diagnosis of SARS-CoV-2 by FDA under an Emergency Use Authorization (EUA). This EUA will remain  in effect (meaning this test can be used) for the duration of the COVID-19 declaration under Section 56 4(b)(1) of the Act, 21 U.S.C. section 360bbb-3(b)(1), unless the authorization is terminated or revoked sooner. Performed at [facility], [place] [address], [place], [place] [address]   Blood culture (routine x 2)     Status: None   Collection Time: 11/17/19 10:50 PM   Specimen: BLOOD  Result Value Ref Range Status   Specimen Description   Final    BLOOD RIGHT Performed at [facility] [address] [place], [place] [address]    Special Requests   Final    BOTTLES DRAWN AEROBIC AND ANAEROBIC Blood Culture results may not be optimal due to an excessive volume of blood received in culture bottles Performed at [facility] [address], [place], [place] [address]    Culture   Final    NO GROWTH 5 DAYS Performed at [facility], [place] [address], [place], [place] [address]    Report Status 11/23/2019 FINAL  Final  Surgical pcr screen     Status: Abnormal   Collection Time: 11/23/19  1:52 AM   Specimen: Nasal Mucosa; Nasal Swab  Result Value Ref Range Status   MRSA, PCR NEGATIVE NEGATIVE Final   Staphylococcus aureus POSITIVE ([place]) NEGATIVE Final    Comment: (NOTE) The Xpert SA Assay (FDA approved for NASAL specimens in patients 58 years of age and older), is one component of Kristeen Lantz comprehensive surveillance program. It is not intended to diagnose infection nor to guide or monitor treatment.   Aerobic/Anaerobic Culture  (surgical/deep wound)     Status: None (Preliminary result)   Collection Time: 11/23/19  8:37 AM   Specimen: PATH Soft tissue  Result Value Ref Range Status   Specimen Description TISSUE  Final  Special Requests LEFT FOOT  Final   Gram Stain   Final    FEW WBC PRESENT, PREDOMINANTLY MONONUCLEAR NO ORGANISMS SEEN Performed at [facility], [place] [address], [place], [place] [address]    Culture FEW STAPHYLOCOCCUS AUREUS  Final   Report Status PENDING  Incomplete         Radiology Studies: No results found.      Scheduled Meds: . amitriptyline  25 mg Oral QHS  . docusate sodium  100 mg Oral BID  . enoxaparin (LOVENOX) injection  40 mg Subcutaneous Q24H  . ferrous sulfate  325 mg Oral QPM  . insulin aspart  0-9 Units Subcutaneous TID WC  . lamoTRIgine  100 mg Oral BID  . simvastatin  40 mg Oral q1800  . sodium chloride flush  10-40 mL Intracatheter Q12H  . sodium chloride flush  3 mL Intravenous Q12H   Continuous Infusions: . sodium chloride 10 mL/hr at 11/24/19 1000  . ceFEPime (MAXIPIME) IV 2 g (11/24/19 1220)  . clindamycin (CLEOCIN) IV Stopped (11/24/19 3953)  . lactated ringers 400 mL/hr at 11/23/19 0905     LOS: 6 days    Time spent: over 30 min    Fayrene Helper, MD [facility]   To contact the attending provider between 7A-7P or the covering provider during after hours 7P-7A, please log into the web site [email or web address] and access using universal [place] password for that web site. If you do not have the password, please call the hospital operator.  11/24/2019, 2:38 PM

## 2019-11-24 NOTE — TOC Progression Note (Addendum)
Transition of Care [facility]) - Progression Note    Patient Details  Name: Karen Williams MRN: [id number] Date of Birth: May 14, 1951  Transition of Care [facility]) CM/SW Contact  Epifanio Lesches, RN Phone Number: [phone] 11/24/2019, 7:38 PM  Clinical Narrative:   Admitted with cellulitis / L foot infection. Hx of  DM II, HLD, seizure disorder, anxiety, anemia, and recent L foot bunionectomy. Pt's place of residence is [place]. Currently living with daughter(caretaker) Sharon Seller in [place]. Gariel states she's mom's personal care aide and is paid for 56 hrs of care weekly.  [name] (Daughter)     [phone]         - s/p L foot debridement with partial excision of 1st and 2nd metatarsal and removal of hardware on 11/23/19, preveena vac.  PT eval/recommedatios:Home health PT. Pt agreeable to [facility] services.Pt will recovery in [place] with daughter. Pt with [employer], not insured in [place]. NCM will reach out to [facility] agency for charity care.  TOC will continue to monitor and follow for needs....  11/25/2019 @0952   Referral made with [facility] , charity case, for HHPT via voice message . .. awaiting response   Expected Discharge Plan: Home/Self Care(56 hrs /week , PCA)    Expected Discharge Plan and Services Expected Discharge Plan: Home/Self Care(56 hrs /week , PCA)      Social Determinants of Health (SDOH) Interventions    Readmission Risk Interventions No flowsheet data found.

## 2019-11-24 NOTE — Progress Notes (Addendum)
Physical Therapy Treatment Patient Details Name: Karen Williams MRN: [id number] DOB: April 02, 1951 Today's Date: 11/24/2019    History of Present Illness Pt is 69 yo female with PMH including DM II, HLD, seizure disorder, anxiety, anemia, and recent L foot bunionectomy.  Pt was admitted with cellulitis and post op infection L foot.  She is s/p L foot debridement with partial excision of 1st and 2nd metatarsal and removal of hardware on 11/23/19.    PT Comments    Pt demonstrates good progress towards functional mobility and for return home. Pt requests to attempt stairs tomorrow with quad cane and knee scooter for improved community ambulation. Pt demonstrates improved ambulation distance today (increased by approximately 30 feet). Her VSS throughout treatment with O2 sats >98% on RA and following ambulation. Pt required a 1-2 min seated rest break after initial ambulation period, then demonstrated increased gait speed with increased hop-distance/speed second half of ambulation. She maintained NWB throughout gait. PT will continue following acutely to progress goals and functional mobility until transition to next level of care.    Follow Up Recommendations  Home health PT     Equipment Recommendations  Rolling walker with 5" wheels       Precautions / Restrictions Precautions Precautions: Fall Restrictions Weight Bearing Restrictions: Yes LLE Weight Bearing: Touchdown weight bearing    Mobility  Bed Mobility Overal bed mobility: Needs Assistance Bed Mobility: Supine to Sit;Sit to Supine     Supine to sit: Min guard Sit to supine: Min guard   General bed mobility comments: VCs for safety with lines/leads and management of wound vac  Transfers Overall transfer level: Needs assistance Equipment used: Rolling walker (2 wheeled) Transfers: Sit to/from Stand Sit to Stand: Min guard         General transfer comment: Pt demonstrated no near LOB today with initial transitions,  demonstrating improved SLS and UE management. Intermittent cues required for sequencing and body mechanics.  Ambulation/Gait Ambulation/Gait assistance: Min guard Gait Distance (Feet): 79 Feet(24+55 (seated rest break in between)) Assistive device: Rolling walker (2 wheeled) Gait Pattern/deviations: Step-to pattern(hop-to pattern with intermittent VCs for improved RW mgt) Gait velocity: decreased   General Gait Details: Pt hopped on R LE with short strides and good use of RW.  Cued for TDWB but pt declined and maintained NWB without difficulty.  Min guard for steadying.       Balance Overall balance assessment: Needs assistance Sitting-balance support: No upper extremity supported;Feet supported Sitting balance-Leahy Scale: Normal     Standing balance support: Bilateral upper extremity supported;During functional activity Standing balance-Leahy Scale: Fair Standing balance comment: able to stand and fix cloak SLS with bed for mild assist, required RW for dynamic movements/control        Cognition Arousal/Alertness: Awake/alert Behavior During Therapy: WFL for tasks assessed/performed Overall Cognitive Status: Within Functional Limits for tasks assessed             General Comments: Pt agreeable to PT, stating she has worked with therapy a lot in the past and feels very capable to return home currently. States she wil be able to performs steps safely with quad cane and family assistance.      Exercises      General Comments General comments (skin integrity, edema, etc.): VSS      Pertinent Vitals/Pain Pain Assessment: Faces Faces Pain Scale: Hurts little more Pain Location: L foot Pain Descriptors / Indicators: Sharp Pain Intervention(s): Repositioned  PT Goals (current goals can now be found in the care plan section) Acute Rehab PT Goals Patient Stated Goal: return home; return to normal function PT Goal Formulation: With patient Time For Goal  Achievement: 12/07/19 Potential to Achieve Goals: Good Progress towards PT goals: Progressing toward goals    Frequency    Min 3X/week      PT Plan Current plan remains appropriate       AM-PAC PT "6 Clicks" Mobility   Outcome Measure  Help needed turning from your back to your side while in a flat bed without using bedrails?: None Help needed moving from lying on your back to sitting on the side of a flat bed without using bedrails?: None Help needed moving to and from a bed to a chair (including a wheelchair)?: A Little Help needed standing up from a chair using your arms (e.g., wheelchair or bedside chair)?: A Little Help needed to walk in hospital room?: A Little Help needed climbing 3-5 steps with a railing? : A Lot 6 Click Score: 19    End of Session Equipment Utilized During Treatment: Gait belt Activity Tolerance: Patient tolerated treatment well Patient left: in bed;with call bell/phone within reach;with bed alarm set;with SCD's reapplied Nurse Communication: Mobility status PT Visit Diagnosis: Unsteadiness on feet (R26.81);Pain Pain - Right/Left: Left Pain - part of body: Leg     Time: 7048-8891 PT Time Calculation (min) (ACUTE ONLY): 26 min  Charges:  $Gait Training: 8-22 mins $Therapeutic Activity: 8-22 mins                     Ann Held PT, DPT Acute Rehab [facility] P: [phone]    Rosmary Dionisio A Brysan Mcevoy 11/24/2019, 12:38 PM

## 2019-11-25 LAB — COMPREHENSIVE METABOLIC PANEL
ALT: 47 U/L — ABNORMAL HIGH (ref 0–44)
AST: 57 U/L — ABNORMAL HIGH (ref 15–41)
Albumin: 3.1 g/dL — ABNORMAL LOW (ref 3.5–5.0)
Alkaline Phosphatase: 104 U/L (ref 38–126)
Anion gap: 9 (ref 5–15)
BUN: 14 mg/dL (ref 8–23)
CO2: 28 mmol/L (ref 22–32)
Calcium: 9.5 mg/dL (ref 8.9–10.3)
Chloride: 102 mmol/L (ref 98–111)
Creatinine, Ser: 0.65 mg/dL (ref 0.44–1.00)
GFR calc Af Amer: >60 mL/min (ref >60)
GFR calc non Af Amer: >60 mL/min (ref >60)
Glucose, Bld: 120 mg/dL — ABNORMAL HIGH (ref 70–99)
Potassium: 4.6 mmol/L (ref 3.5–5.1)
Sodium: 139 mmol/L (ref 135–145)
Total Bilirubin: 0.4 mg/dL (ref 0.3–1.2)
Total Protein: 6.7 g/dL (ref 6.5–8.1)

## 2019-11-25 LAB — CBC
HCT: 36.7 % (ref 36.0–46.0)
Hemoglobin: 11.6 g/dL — ABNORMAL LOW (ref 12.0–15.0)
MCH: 28.8 pg (ref 26.0–34.0)
MCHC: 31.6 g/dL (ref 30.0–36.0)
MCV: 91.1 fL (ref 80.0–100.0)
Platelets: 316 10*3/uL (ref 150–400)
RBC: 4.03 MIL/uL (ref 3.87–5.11)
RDW: 17.1 % — ABNORMAL HIGH (ref 11.5–15.5)
WBC: 6.4 10*3/uL (ref 4.0–10.5)
nRBC: 0 % (ref 0.0–0.2)

## 2019-11-25 LAB — MAGNESIUM: Magnesium: 1.7 mg/dL (ref 1.7–2.4)

## 2019-11-25 LAB — GLUCOSE, CAPILLARY
Glucose-Capillary: 112 mg/dL — ABNORMAL HIGH (ref 70–99)
Glucose-Capillary: 190 mg/dL — ABNORMAL HIGH (ref 70–99)

## 2019-11-25 LAB — PHOSPHORUS: Phosphorus: 4.8 mg/dL — ABNORMAL HIGH (ref 2.5–4.6)

## 2019-11-25 MED ORDER — MUPIROCIN 2 % EX OINT: 1.0000 "application " | TOPICAL_OINTMENT | Freq: Two times a day (BID) | CUTANEOUS | Status: DC

## 2019-11-25 MED ORDER — AMOXICILLIN-POT CLAVULANATE 875-125 MG PO TABS: 1.0000 | ORAL_TABLET | Freq: Two times a day (BID) | ORAL | 0 refills | Status: AC

## 2019-11-25 MED ORDER — OXYCODONE HCL 10 MG PO TABS: 10.0000 mg | ORAL_TABLET | Freq: Four times a day (QID) | ORAL | 0 refills | Status: AC | PRN

## 2019-11-25 MED ORDER — CEPHALEXIN 500 MG PO CAPS: 500.0000 mg | ORAL_CAPSULE | Freq: Four times a day (QID) | ORAL | 0 refills | Status: DC

## 2019-11-25 MED ORDER — CHLORHEXIDINE GLUCONATE CLOTH 2 % EX PADS
6.0000 | MEDICATED_PAD | Freq: Every day | CUTANEOUS | Status: DC
  Administered 2019-11-25: 15:00:00 6 via TOPICAL

## 2019-11-25 MED FILL — AMOX-CLAV 875-125 MG TABLET: 875-125 | 28 days supply | Qty: 56 | Fill #0

## 2019-11-25 MED FILL — oxyCODONE HCL 10 MG TABS: 10 | 5 days supply | Qty: 20 | Fill #0

## 2019-11-25 NOTE — Plan of Care (Signed)
  Problem: Clinical Measurements: Goal: Ability to maintain clinical measurements within normal limits will improve Outcome: Progressing Goal: Will remain free from infection Outcome: Progressing   Problem: Education: Goal: Knowledge of General Education information will improve Description: Including pain rating scale, medication(s)/side effects and non-pharmacologic comfort measures Outcome: Progressing   Problem: Pain Managment: Goal: General experience of comfort will improve Outcome: Progressing   Problem: Safety: Goal: Ability to remain free from injury will improve Outcome: Progressing   Problem: Skin Integrity: Goal: Risk for impaired skin integrity will decrease Outcome: Progressing

## 2019-11-25 NOTE — Progress Notes (Signed)
POD2 doing well anxious to go home. Some nausea last night. Vac in place. cx pansensitive staph aureus so far.  Will need to follow up for Vac removel 1 next week with Dr. Lajoyce Corners

## 2019-11-25 NOTE — Progress Notes (Addendum)
Discharge instructions provided. Patient verbalizes understanding of all instructions and follow-up care.  Valuables obtained from security, verified with patient, and returned to patient.  Patient discharged to home via wheelchair by RN at 1623 on 11/25/19.

## 2019-11-25 NOTE — Discharge Summary (Signed)
Physician Discharge Summary  Karen Williams ZOX:[id number] DOB: [birth year] DOA: 11/17/2019  PCP: Patient, No Pcp Per  Admit date: 11/17/2019 Discharge date: 11/25/2019  Time spent: 40 minutes  Recommendations for Outpatient Follow-up:  1. Follow outpatient CBC/CMP 2. Follow up with Dr. Sharol Given in 1 week, wound vac removal 3. Discharged with 4 weeks antibiotics 4. 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  Presented secondary to left foot pain and found to have worsening cellulitis.  She was admitted for Karen Williams post operative infection of the L foot with deep retained hardware.  She had her foot debrided by ortho with partial excision of the first and second metatarsal and removal of deep retained hardware.  Wound cx grew MSSA.  Discharged on augmentin.  Plan for follow outpatient with Dr. Sharol Given.  Hospital Course:  Left foot cellulitis -Post op infection from recent bunionectomy. Purulent drainage noted. Concern for abscess but also would be concerning for osteomyelitis. Started on empiric Clindamycin on admission. Patient with recurrent emesis which she has issues with as an outpatient as well. MRI obtained and with no evidence of abscess or osteomyelitis; some  reactivity noted attributed to recent bunionectomy (early osteo cannot be excluded). Blood cultures no growth to date. - 3/24 - s/p L foot debridement with partial excision of the first and second metatarsal, remove deep retained hardware, placement of 13 cm prevent wound vac, local tissue rearrangement for 2 wound closures - Follow 3/24 surgical cultures -> few staph aureus - MSSA -> discharge with augmentin x28 days -Continue Clindamycin IV and Cefepime(cefepime added 3/21 secondary to continued significant pain and tenderness) - pain improved, but still present - ibuprofen, oxycodone, dilaudid for breakthrough - appreciate ortho recs -> follow up in 1 week with Dr. Sharol Given for wound vac removal  DM type II controlled with complication -8/11 hemoglobin A1c=6.8 - bg's reasonable today -Diet controlled -Sensitive SSI  Iron deficiency anemia -Patient is on iron supplementation. Will hold for nowin setting of infection  Left knee pain -No complaints today  Hyperlipidemia -Lipid panel pending -Simvastatin 40 mg daily  Seizure disorder -Lamictal 100 mgBID  Anxiety -Clonazepam 0.5 mgBID  Headache - no c/o HA   Procedures: - 3/24 - s/p L foot debridement with partial excision of the first and second metatarsal, remove deep retained hardware, placement of 13 cm prevent wound vac, local tissue rearrangement for 2 wound closures  Consultations:  orthopedics  Discharge Exam: Vitals:   11/24/19 1928 11/25/19 0900  BP: 123/70 124/81  Pulse: 79 84  Resp: 17 18  Temp: 98.6 F (37 C) 97.7 F (36.5 C)  SpO2: 100% 100%   Feeling better Ready to go home Discussed with daughter over phone  General: No acute distress. Cardiovascular: Heart sounds show Karen Williams regular rate, and rhythm.  Lungs: Clear to  auscultation bilaterally  Abdomen: Soft, nontender, nondistended Neurological: Alert and oriented 3. Moves all extremities 4. Cranial nerves II through XII grossly  intact. Skin: Warm and dry. No rashes or lesions. Extremities: LLE with wound vac in place   Discharge Instructions   Discharge Instructions    Diet - low sodium heart healthy   Complete by: As directed    Discharge instructions   Complete by: As directed    You were seen for Karen Williams wound infection.  Dr. [name] debrided your left foot and removed the hardware.  You have Daemon Dowty wound vac in place.  We'll send you home with home health.  Follow up with orthopedics in 1 week to remove the wound vac.   You'll go with an additional 4 weeks of keflex.    Return for new, recurrent, or worsening symptoms.  Please ask your PCP to request records from this hospitalization so they know what was done and what the next steps will be.   Increase activity slowly   Complete by: As directed    Negative Pressure Wound Therapy - Incisional   Complete by: As directed    Show patient how to attach prevena pump     Allergies as of 11/25/2019      Reactions   Tape Itching   Tylenol [acetaminophen] Rash      Medication List    TAKE these medications   albuterol 108 (90 Base) MCG/ACT inhaler Commonly known as: VENTOLIN HFA Inhale 2 puffs into the lungs every 6 (six) hours as needed for wheezing or shortness of breath.   amitriptyline 25 MG tablet Commonly known as: ELAVIL Take 25 mg by mouth at bedtime.   amoxicillin-clavulanate 875-125 MG tablet Commonly known as: Augmentin Take 1 tablet by mouth 2 (two) times daily for 28 days.   clonazePAM 0.5 MG tablet Commonly known as: KLONOPIN Take 0.5 mg by mouth 2 (two) times daily as needed for anxiety.   cyclobenzaprine 10 MG tablet Commonly known as: FLEXERIL Take 10 mg by mouth 3 (three) times daily as needed for muscle spasms.   ferrous sulfate 325 (65 FE) MG tablet Take 325 mg by mouth every evening.   lamoTRIgine 100 MG tablet Commonly known as: LAMICTAL Take 100 mg by mouth 2 (two) times daily.   meclizine 25 MG tablet Commonly known  as: ANTIVERT Take 25 mg by mouth 3 (three) times daily as needed for dizziness.   nitroGLYCERIN 0.4 MG SL tablet Commonly known as: NITROSTAT Place 0.4 mg under the tongue every 5 (five) minutes as needed for chest pain.   Oxycodone HCl 10 MG Tabs Take 1 tablet (10 mg total) by mouth every 6 (six) hours as needed for up to 5 days (pain).   simvastatin 40 MG tablet Commonly known as: ZOCOR Take 40 mg by mouth daily.            Durable Medical Equipment  (From admission, onward)         Start     Ordered   11/25/19 1457  DME Walker  Once    Question Answer Comment  Walker: With 5 Inch Wheels   Patient needs Isaish Alemu walker to treat with the following condition Physical deconditioning      11/25/19 1456         Allergies  Allergen Reactions  . Tape Itching  . Tylenol [Acetaminophen] Rash   Follow-up Information    Nadara Mustarduda, Marcus V, MD In 1 week.   Specialty: Orthopedic Surgery Contact  information: [address] [place] [address] [phone]        [facility] Follow up.   Specialty: Home Health Services Why: home health PT arranged Contact information: [address] [place] [address] [phone]            The results of significant diagnostics from this hospitalization (including imaging, microbiology, ancillary and laboratory) are listed below for reference.    Significant Diagnostic Studies: CT HEAD WO CONTRAST  Result Date: 11/20/2019 CLINICAL DATA:  Headache, acute, normal neuro exam. Additional history provided: Headache with vomiting. EXAM: CT HEAD WITHOUT CONTRAST TECHNIQUE: Contiguous axial images were obtained from the base of the skull through the vertex without intravenous contrast. COMPARISON:  No pertinent prior studies available for comparison. FINDINGS: Brain: There is no evidence of acute intracranial hemorrhage, intracranial mass, midline shift or extra-axial fluid collection.No demarcated cortical infarction.  Patchy hypodensity within the cerebral white matter is nonspecific, but consistent with chronic small vessel ischemic disease. Cerebral volume is normal for age. Vascular: No hyperdense vessel.  Atherosclerotic calcifications. Skull: Normal. Negative for fracture or focal lesion. Sinuses/Orbits: Visualized orbits demonstrate no acute abnormality. No significant paranasal sinus disease or mastoid effusion at the imaged levels. IMPRESSION: No evidence of acute intracranial abnormality. Patchy hypodensity within the cerebral white matter is nonspecific, but consistent with chronic small vessel ischemic disease. Electronically Signed   By: Jackey Loge DO   On: 11/20/2019 11:34   MR FOOT LEFT W WO CONTRAST  Result Date: 11/19/2019 CLINICAL DATA:  Redness and swelling at left foot surgical site. History of bunionectomy 10/19/2019 EXAM: MRI OF THE LEFT FOREFOOT WITHOUT AND WITH CONTRAST TECHNIQUE: Multiplanar, multisequence MR imaging of the left forefoot was performed both before and after administration of intravenous contrast. CONTRAST:  43mL GADAVIST GADOBUTROL 1 MMOL/ML IV SOLN COMPARISON:  X-ray 11/17/2019 FINDINGS: Bones/Joint/Cartilage Postoperative changes from bunionectomy involving the first metatarsal and great toe proximal phalanx. Multiple anchors within the first metatarsal diaphysis and distal metaphysis. There is edema-like bone marrow signal and enhancement throughout the first metatarsal extending from the proximal metadiaphysis to the metatarsal head. The fatty T1 marrow signal is overall preserved. Low T1 signal at the dorsomedial aspect of the first metatarsal head (series 7, images 12-13) is nonspecific. No joint effusion. Single anchor within the base of the first proximal phalanx with minimal adjacent high T2 signal. Osteotomy of the second metatarsal head with associated marrow edema and enhancement. The bone marrow signal of the remaining visualized forefoot is normal. Ligaments Intact  Lisfranc ligament. Postsurgical changes with capsular thickening at the first and second MTP joints. Collateral ligaments of the forefoot are otherwise intact. Muscles and Tendons Edema of the intraosseous muscles at the medial aspect of the forefoot at prior surgical site is nonspecific. Muscle bulk is preserved. Tendinous structures appear intact without tenosynovitis. Soft tissues Marked soft tissue swelling and edema over the dorsal aspect of the forefoot at the level of the first and second metatarsals. Multiple scattered areas of susceptibility likely postsurgical. No well-defined fluid collection. IMPRESSION: 1. Soft tissue swelling and edema over the dorsal aspect of the forefoot at the level of the first and second metatarsals likely reflecting combination of postsurgical change and cellulitis. No well-defined fluid collection or abscess. 2. Low T1 signal at the dorsomedial aspect of the first metatarsal head is nonspecific and may be related to postsurgical changes, although early osteomyelitis cannot be excluded. 3. Marrow edema and enhancement at the surgical site within the second metatarsal  and great toe proximal phalanx is likely postsurgical/reactive. 4. Edema of the intraosseous muscles at the medial aspect of the forefoot at the level of the first and second metatarsals is nonspecific and may be reactive. No well-defined fluid collection. 5. Postsurgical changes from bunionectomy involving the first metatarsal and great toe proximal phalanx. Electronically Signed   By: Duanne Guess D.O.   On: 11/19/2019 11:45   DG Foot Complete Left  Result Date: 11/17/2019 CLINICAL DATA:  69 year old female with recent left foot surgery on 10/17/2019. Patient presenting with pain and swelling. EXAM: LEFT FOOT - COMPLETE 3+ VIEW COMPARISON:  None. FINDINGS: Mildly displaced fractures of the distal first and second metatarsals with fixation pins. The pins appear intact. There is some bridging callus  formation without complete healing at this time. Evaluation is limited as no prior images is available. No other acute fracture identified. The bones are osteopenic. There is no dislocation. There is soft tissue swelling of the forefoot. No soft tissue gas. IMPRESSION: 1. Fixation pins of the distal first and second metatarsal fractures. 2. Soft tissue swelling the foot. Clinical correlation is recommended to evaluate for infection. Electronically Signed   By: Elgie Collard M.D.   On: 11/17/2019 21:32    Microbiology: Recent Results (from the past 240 hour(s))  SARS CORONAVIRUS 2 (TAT 6-24 HRS) Nasopharyngeal Nasopharyngeal Swab     Status: None   Collection Time: 11/17/19 10:00 PM   Specimen: Nasopharyngeal Swab  Result Value Ref Range Status   SARS Coronavirus 2 NEGATIVE NEGATIVE Final    Comment: (NOTE) SARS-CoV-2 target nucleic acids are NOT DETECTED. The SARS-CoV-2 RNA is generally detectable in upper and lower respiratory specimens during the acute phase of infection. Negative results do not preclude SARS-CoV-2 infection, do not rule out co-infections with other pathogens, and should not be used as the sole basis for treatment or other patient management decisions. Negative results must be combined with clinical observations, patient history, and epidemiological information. The expected result is Negative. Fact Sheet for Patients: HairSlick.no Fact Sheet for Healthcare Providers: quierodirigir.com This test is not yet approved or cleared by the [place] FDA and  has been authorized for detection and/or diagnosis of SARS-CoV-2 by FDA under an Emergency Use Authorization (EUA). This EUA will remain  in effect (meaning this test can be used) for the duration of the COVID-19 declaration under Section 56 4(b)(1) of the Act, 21 U.S.C. section 360bbb-3(b)(1), unless the authorization is terminated or revoked sooner. Performed  at [facility], [address] [place], [place] [address]   Blood culture (routine x 2)     Status: None   Collection Time: 11/17/19 10:50 PM   Specimen: BLOOD  Result Value Ref Range Status   Specimen Description   Final    BLOOD RIGHT Performed at [facility], [address], [place], [place] [address]    Special Requests   Final    BOTTLES DRAWN AEROBIC AND ANAEROBIC Blood Culture results may not be optimal due to an excessive volume of blood received in culture bottles Performed at [facility], [address], [place], [place] [address]    Culture   Final    NO GROWTH 5 DAYS Performed at [facility], [address] [place], [place] [address]    Report Status 11/23/2019 FINAL  Final  Surgical pcr screen     Status: Abnormal   Collection Time: 11/23/19  1:52 AM   Specimen: Nasal Mucosa;  Nasal Swab  Result Value Ref Range Status   MRSA, PCR NEGATIVE NEGATIVE Final   Staphylococcus aureus POSITIVE (Agustine Rossitto) NEGATIVE Final    Comment: (NOTE) The Xpert SA Assay (FDA approved for NASAL specimens in patients 27 years of age and older), is one component of Jarah Pember comprehensive surveillance program. It is not intended to diagnose infection nor to guide or monitor treatment.   Aerobic/Anaerobic Culture (surgical/deep wound)     Status: None (Preliminary result)   Collection Time: 11/23/19  8:37 AM   Specimen: PATH Soft tissue  Result Value Ref Range Status   Specimen Description TISSUE  Final   Special Requests LEFT FOOT  Final   Gram Stain   Final    FEW WBC PRESENT, PREDOMINANTLY MONONUCLEAR NO ORGANISMS SEEN    Culture   Final    FEW STAPHYLOCOCCUS AUREUS NO ANAEROBES ISOLATED; CULTURE IN PROGRESS FOR 5 DAYS CRITICAL RESULT CALLED TO, READ BACK BY AND VERIFIED WITH: RN Carlisle Beers [phone]  FCP Performed at [facility], [address] [place], [place] [address]    Report Status PENDING  Incomplete   Organism ID,  Bacteria STAPHYLOCOCCUS AUREUS  Final      Susceptibility   Staphylococcus aureus - MIC*    CIPROFLOXACIN <=0.5 SENSITIVE Sensitive     ERYTHROMYCIN <=0.25 SENSITIVE Sensitive     GENTAMICIN <=0.5 SENSITIVE Sensitive     OXACILLIN <=0.25 SENSITIVE Sensitive     TETRACYCLINE <=1 SENSITIVE Sensitive     VANCOMYCIN 1 SENSITIVE Sensitive     TRIMETH/SULFA <=10 SENSITIVE Sensitive     CLINDAMYCIN <=0.25 SENSITIVE Sensitive     RIFAMPIN <=0.5 SENSITIVE Sensitive     Inducible Clindamycin NEGATIVE Sensitive     * FEW STAPHYLOCOCCUS AUREUS     Labs: Basic Metabolic Panel: Recent Labs  Lab 11/19/19 0503 11/22/19 1507 11/23/19 0419 11/24/19 0409 11/25/19 0402  NA 139 138 140 136 139  K 4.1 4.5 4.6 4.3 4.6  CL 103 102 103 98 102  CO2 30 28 29 29 28   GLUCOSE 113* 113* 96 130* 120*  BUN 8 13 7* 8 14  CREATININE 0.60 0.89 0.74 0.61 0.65  CALCIUM 8.8* 8.5* 8.6* 9.3 9.5  MG  --  1.9 2.0 1.7 1.7  PHOS  --  3.9 4.0 3.6 4.8*   Liver Function Tests: Recent Labs  Lab 11/22/19 1507 11/23/19 0419 11/24/19 0409 11/25/19 0402  AST 22 19 41 57*  ALT 23 22 31  47*  ALKPHOS 105 105 111 104  BILITOT 0.5 0.5 0.5 0.4  PROT 6.4* 6.2* 6.7 6.7  ALBUMIN 3.0* 2.9* 3.1* 3.1*   No results for input(s): LIPASE, AMYLASE in the last 168 hours. No results for input(s): AMMONIA in the last 168 hours. CBC: Recent Labs  Lab 11/19/19 0503 11/22/19 1507 11/23/19 0419 11/24/19 0409 11/25/19 0402  WBC 6.0 7.0 6.2 8.8 6.4  NEUTROABS  --  4.3  --   --   --   HGB 10.3* 10.5* 11.2* 10.7* 11.6*  HCT 33.3* 33.9* 35.7* 34.1* 36.7  MCV 91.7 91.6 90.2 91.2 91.1  PLT 263 286 305 307 316   Cardiac Enzymes: No results for input(s): CKTOTAL, CKMB, CKMBINDEX, TROPONINI in the last 168 hours. BNP: BNP (last 3 results) No results for input(s): BNP in the last 8760 hours.  ProBNP (last 3 results) No results for input(s): PROBNP in the last 8760 hours.  CBG: Recent Labs  Lab 11/24/19 1132 11/24/19 1557  11/24/19 1948 11/25/19  0645 11/25/19 1141  GLUCAP 87 116* 133* 112* 190*       Signed:  Lacretia Nicks MD.  [facility] 11/25/2019, 7:58 PM

## 2019-11-25 NOTE — Progress Notes (Signed)
PT Cancellation Note  Patient Details Name: Elleigh Cassetta MRN: [id number] DOB: 1951-03-21   Cancelled Treatment:    Reason Eval/Treat Not Completed: Pain limiting ability to participate. Pt with 10/10 "shooting" pain in L second toe. Pt's RN and charge RN notified of pt's request for pain meds. PT will continue to f/u with pt acutely as available and appropriate.    Alessandra Bevels Aydyn Testerman 11/25/2019, 11:03 AM

## 2019-11-28 LAB — AEROBIC/ANAEROBIC CULTURE W GRAM STAIN (SURGICAL/DEEP WOUND)

## 2019-11-30 ENCOUNTER — Ambulatory Visit (INDEPENDENT_AMBULATORY_CARE_PROVIDER_SITE_OTHER): Payer: Medicaid - Out of State | Admitting: Family

## 2019-11-30 ENCOUNTER — Encounter: Payer: Self-pay | Admitting: Family

## 2019-11-30 ENCOUNTER — Other Ambulatory Visit: Payer: Self-pay

## 2019-11-30 DIAGNOSIS — T847XXD Infection and inflammatory reaction due to other internal orthopedic prosthetic devices, implants and grafts, subsequent encounter: Secondary | ICD-10-CM

## 2019-11-30 NOTE — Progress Notes (Signed)
   Post-Op Visit Note   Patient: Karen Williams           Date of Birth: Apr 13, 1951           MRN: [id number] Visit Date: 11/30/2019 PCP: Patient, No Pcp Per  Chief Complaint: No chief complaint on file.   HPI:  HPI The patient is a 69 year old woman who presents today 1 week status post removal of infected hardware from prior bunion surgery left foot.  She has a wound VAC in place this is removed today.  She is in a wheelchair daughter accompanies. Ortho Exam On examination the dorsal incision is well approximated with sutures this is healing well there is no gaping no drainage the medial column incision is approximated sutures there is a 5 mm area that has gaped there is proud granulation tissue there is no active drainage no surrounding erythema no drainage no sign of infection  Visit Diagnoses:  1. Infected hardware in left lower extremity, subsequent encounter     Plan: Begin daily dose of cleansing.  Dry dressing changes.  She will continue nonweightbearing.  She will follow-up next Friday she states she will be returning to [place] on April 10 and hopes to have her last visit with [place] prior to leaving to [place]  Follow-Up Instructions: Return in about 9 days (around 12/09/2019).   Imaging: No results found.  Orders:  No orders of the defined types were placed in this encounter.  No orders of the defined types were placed in this encounter.    PMFS History: Patient Active Problem List   Diagnosis Date Noted  . Type 2 diabetes mellitus with complication, without long-term current use of insulin (HCC) 11/22/2019  . HLD (hyperlipidemia) 11/22/2019  . Seizure disorder (HCC) 11/22/2019  . Anxiety 11/22/2019  . Cellulitis of left foot 11/22/2019  . 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 Cellulitis of foot, left 11/18/2019  . Left foot pain 11/18/2019  . Diabetes (HCC)   . Seizures (HCC)    Past Medical History:  Diagnosis Date  . Diabetes (HCC)   . Lupus (HCC)     . Seizures (HCC)   . Vertigo     History reviewed. No pertinent family history.  Past Surgical History:  Procedure Laterality Date  . ABDOMINAL HYSTERECTOMY    . HARDWARE REMOVAL Left 11/23/2019   Procedure: LEFT FOOT DEBRIDEMENT AND REMOVE HARDWARE;  Surgeon: Nadara Mustard, MD;  Location: [facility];  Service: Orthopedics;  Laterality: Left;   Social History   Occupational History  . Not on file  Tobacco Use  . Smoking status: Former Smoker    Types: Cigarettes    Quit date: 09/02/1983    Years since quitting: 36.2  . Smokeless tobacco: Never Used  Substance and Sexual Activity  . Alcohol use: Never  . Drug use: Never  . Sexual activity: Not Currently

## 2019-12-09 ENCOUNTER — Other Ambulatory Visit: Payer: Self-pay

## 2019-12-09 ENCOUNTER — Ambulatory Visit (INDEPENDENT_AMBULATORY_CARE_PROVIDER_SITE_OTHER): Payer: Medicaid - Out of State | Admitting: Family

## 2019-12-09 ENCOUNTER — Encounter: Payer: Self-pay | Admitting: Family

## 2019-12-09 DIAGNOSIS — T847XXD Infection and inflammatory reaction due to other internal orthopedic prosthetic devices, implants and grafts, subsequent encounter: Secondary | ICD-10-CM

## 2019-12-09 NOTE — Progress Notes (Signed)
   Post-Op Visit Note   Patient: Karen Williams           Date of Birth: [birth year]           MRN: [id number] Visit Date: 12/09/2019 PCP: Patient, No Pcp Per  Chief Complaint: No chief complaint on file.   HPI:  HPI The patient is a 69 year old woman who is seen today status post removal of deep retained hardware left foot.  She is moving tomorrow back to her home in [place] and will not be following up after today.  She continues to have some mild swelling and aching of her foot this is gradually improving since her surgery she has no new concerns she has been full weightbearing in a bedroom slipper  Ortho Exam Both of the incisions to her left foot are well-healed sutures are in place there is no gaping no drainage no erythema no warmth  Visit Diagnoses: No diagnosis found.  Plan: Sutures harvested today without incident.  She will continue to advance her weightbearing as tolerated.  Discussed return precautions and infectious signs to watch out for she will call with any concerns.  Follow-Up Instructions: No follow-ups on file.   Imaging: No results found.  Orders:  No orders of the defined types were placed in this encounter.  No orders of the defined types were placed in this encounter.    PMFS History: Patient Active Problem List   Diagnosis Date Noted  . Type 2 diabetes mellitus with complication, without long-term current use of insulin (HCC) 11/22/2019  . HLD (hyperlipidemia) 11/22/2019  . Seizure disorder (HCC) 11/22/2019  . Anxiety 11/22/2019  . Cellulitis of left foot 11/22/2019  . 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 Cellulitis of foot, left 11/18/2019  . Left foot pain 11/18/2019  . Diabetes (HCC)   . Seizures (HCC)    Past Medical History:  Diagnosis Date  . Diabetes (HCC)   . Lupus (HCC)   . Seizures (HCC)   . Vertigo     No family history on file.  Past Surgical History:  Procedure Laterality Date  . ABDOMINAL HYSTERECTOMY    . HARDWARE  REMOVAL Left 11/23/2019   Procedure: LEFT FOOT DEBRIDEMENT AND REMOVE HARDWARE;  Surgeon: Nadara Mustard, MD;  Location: [facility] OR;  Service: Orthopedics;  Laterality: Left;   Social History   Occupational History  . Not on file  Tobacco Use  . Smoking status: Former Smoker    Types: Cigarettes    Quit date: 09/02/1983    Years since quitting: 36.2  . Smokeless tobacco: Never Used  Substance and Sexual Activity  . Alcohol use: Never  . Drug use: Never  . Sexual activity: Not Currently

## 2020-02-12 ENCOUNTER — Emergency Department: Admit: 2020-02-13 | Payer: PRIVATE HEALTH INSURANCE | Primary: Internal Medicine

## 2020-02-12 DIAGNOSIS — S80212A Abrasion, left knee, initial encounter: Secondary | ICD-10-CM

## 2020-02-12 NOTE — ED Notes (Signed)
Pt reports L knee pain from a fall yesterday   Denies hitting head or LOC.  Reports taking Eliquis

## 2020-02-12 NOTE — ED Notes (Signed)
Melissa PAC at bedside to evaluate

## 2020-02-12 NOTE — ED Provider Notes (Signed)
ED Provider Notes by Susanne Borders, PA-C at 02/12/20 2158                Author: Susanne Borders, PA-C  Service: EMERGENCY  Author Type: Physician Assistant       Filed: 02/12/20 2235  Date of Service: 02/12/20 2158  Status: Attested           Editor: Silver Huguenin (Physician Assistant)  Cosigner: Christiana Pellant, MD at 02/23/20 1313          Attestation signed by Christiana Pellant, MD at 02/23/20 1313          I have discussed the patient's presentation with the APP.  Based upon their report of the patient's complaint(s), pertinent history, pertinent exam, and testing  results, I agree with their assessment and management plan.  I have been available for consultation throughout the patient's stay in the Emergency Department.                                    [facility]   Emergency Department Treatment Report                Patient: [name] [name]  Age: 69 y.o.  Sex: female          Date of Birth: June 30, 1951  Admit Date: 02/12/2020  PCP: Otto Herb, MD     MRN: [id number]   CSN: [id number]   MD Christiana Pellant, MD         Room: ER14/ER14  Time Dictated: 9:58 PM  APP: Susanne Borders, PA-C               Chief Complaint    Knee pain        History of Present Illness     69 y.o. female  with history of chronic pain syndrome, fibromyalgia, CVA and right hemiparesis, left knee pain, lupus, vertigo and recurrent falls, presents to the emergency room today with chief complaint of left knee pain.      States she was going down the two steps last night, and fell, states her legs gave out, causing her to fall forward, she remembers falling, onto her left knee, did not hit her head, did not lose consciousness, and then "backed" up the stairs into her  house, took two flexeril, and went to bed. So when she woke up, had left knee pain, that worsened with movement, no other palliative measures, radiates down her leg, associated with swelling in her left foot that is  chronic and intermittent since surgery  several years ago.    States couldn't find her oxycodone, so took 2 flexeril.    Has home health care.    Denies chest pain or shortness of breath, abdominal pain, nausea, vomiting, dysuria, unilateral weakness, numness or tingling, groin numbness, loss of bowel or bladder control.      Review of Systems     Review of Systems    Constitutional: Negative for chills and fever.    HENT: Negative for sore throat.     Eyes: Negative for blurred vision.    Respiratory: Negative for cough and shortness of breath.     Cardiovascular: Negative for chest pain.    Gastrointestinal: Negative for abdominal pain, diarrhea, nausea and vomiting.    Genitourinary: Negative for dysuria.    Musculoskeletal: Positive for falls and joint pain .  Left knee pain    Skin: Negative for rash.    Neurological: Negative for loss of consciousness.    Endo/Heme/Allergies: Bruises/bleeds easily.         Eliquis    All other systems reviewed and are negative.           Past Medical/Surgical History          Past Medical History:        Diagnosis  Date         ?  Acute hypercapnic respiratory failure (HCC)  02/05/2013          W/ Encephalopathy requiring BiPAP, Etiology Uncertain however Possible Narcotic Benzodiazipine Related?          ?  Adjustment disorder with mixed anxiety and depressed mood       ?  Aneurysm of internal carotid artery       ?  Arthritis of knee       ?  Asthma       ?  Autoimmune disease ([place])       ?  CAD (coronary artery disease)       ?  Cervicalgia       ?  Chest pain  03/17/2014          Dobutamine Stress Echo: NORMAL WALL MOTION AND GLOBAL SYSTOLIC FUNCTION AT REST AND AFTER?? STRESS/EXERCISE WITH APPROPRIATE AUGMENTATION OF FUNCTION  IN ALL SEGMENTS. NO EVIDENCE OF INDUCIBLE ISCHEMIA AT ADEQUATE HEART RATE WITH DOBUTAMINE STRESS.??                  ?  Chronic low back pain  10/22/2014          [facility] MRI Lumbar w/o contrast: Moderate facet arthropathy at L4-L5 and L5-S1.  Mild/moderate foraminal stenosis at these levels. Disc bulge with annular  tear at L4-L5.          ?  Chronic pain syndrome       ?  Coccydynia       ?  Diabetes mellitus            NIDDM         ?  DVT (deep venous thrombosis) ([place])  05/19/2007          [place] CTA: 1. Small nonocclusive pulmonary embolus in a subsegmental branch to the left lower lobe. Larger filling defect in a segmental branch to  the right lower lobe, not as low in attenuation as is normally seen with a pulmonary embolus, however is seen in multiple planes and remains concerning for an additional nonocclusive pulmonary embolus.         ?  Fibromyalgia       ?  Gastric ulcer       ?  Gastritis  01/03/2015          [place] Admission, EGD + H. Pylori/Gastritis         ?  Generalized osteoarthritis of multiple sites       ?  GI bleed  01/01/2015          [place] Admit 5/2-01/07/2015         ?  H. pylori infection  01/03/2015          [place] Admission, EGD + H. Pylori/Gastritis         ?  Hemiparesis, right ([place])       ?  Hyperlipidemia       ?  Hypertension       ?  Irregular sleep-wake rhythm       ?  Knee joint pain       ?  Lumbar spondylosis            Orthopedic Spine Surgeon: Dr. Ree Kida L. Henrene Hawking          ?  Muscle spasm       ?  Normocytic anemia            Chronic Iron Deficiency Anemia         ?  Osteoporosis       ?  Pancreatitis       ?  Peripheral neuropathy       ?  Pulmonary embolism (HCC)            Multiple         ?  Seizures (HCC)       ?  Sensory ataxia       ?  SLE (systemic lupus erythematosus) (HCC)       ?  Stroke [facility])       ?  Subclinical hypothyroidism       ?  Vertigo           ?  Vitamin D deficiency            Past Surgical History:         Procedure  Laterality  Date          ?  HX CHOLECYSTECTOMY         ?  HX GI              Partial Gastrectomy           ?  HX GI    01/03/2015          [facility] EGD by Dr. Smitty Cords D. Waldholtz: S/P B-2 Gastric Resection, Gastritis in small remnant, Bx of Jejunum r/o Celiac Disease and of Stomach. +  Gastritis  + H. Pylori          ?  HX HYSTERECTOMY         ?  PR CARDIAC SURG PROCEDURE UNLIST              Cardiac Cath PCI w/ Stents             Social History          Social History          Socioeconomic History         ?  Marital status:  DIVORCED              Spouse name:  Not on file         ?  Number of children:  Not on file     ?  Years of education:  Not on file     ?  Highest education level:  Not on file       Occupational History        ?  Not on file       Tobacco Use         ?  Smoking status:  Former Smoker     ?  Smokeless tobacco:  Never Used       Substance and Sexual Activity         ?  Alcohol use:  No     ?  Drug use:  No     ?  Sexual activity:  Not Currently  Partners:  Male        Other Topics  Concern        ?  Not on file       Social History Narrative        ?  Not on file          Social Determinants of Health          Financial Resource Strain:         ?  Difficulty of Paying Living Expenses:        Food Insecurity:         ?  Worried About Programme researcher, broadcasting/film/video in the Last Year:      ?  Barista in the Last Year:        Transportation Needs:         ?  Freight forwarder (Medical):      ?  Lack of Transportation (Non-Medical):        Physical Activity:         ?  Days of Exercise per Week:      ?  Minutes of Exercise per Session:        Stress:         ?  Feeling of Stress :        Social Connections:         ?  Frequency of Communication with Friends and Family:      ?  Frequency of Social Gatherings with Friends and Family:      ?  Attends Religious Services:      ?  Active Member of Clubs or Organizations:      ?  Attends Banker Meetings:      ?  Marital Status:        Intimate Partner Violence:         ?  Fear of Current or Ex-Partner:      ?  Emotionally Abused:      ?  Physically Abused:         ?  Sexually Abused:              Family History          Family History         Problem  Relation  Age of Onset          ?  Diabetes  Maternal  Grandmother       ?  Heart Disease  Maternal Grandmother            ?  Heart Disease  Mother               Current Medications          Current Facility-Administered Medications             Medication  Dose  Route  Frequency  Provider  Last Rate  Last Admin              ?  oxyCODONE ER (OxyCONTIN) tablet 10 mg   10 mg  Oral  ONCE  [place] Behlke V, PA-C                Current Outpatient Medications          Medication  Sig  Dispense  Refill           ?  gabapentin (NEURONTIN) 100 mg capsule  Take 1 Cap by mouth two (2) times a  day. Max Daily Amount: 200 mg.  60 Cap  0     ?  naloxone (NARCAN) 0.4 mg/mL injection  0.25 mL by IntraVENous route as needed for Other (Give if breaths per minute is less than or equal to 8, may repeat dose in 15 minutes and contact  MD.).  1 mL  0     ?  calcium carbonate (CALTREX) 600 mg calcium (1,500 mg) tablet  Take 600 mg by mouth daily.               ?  apixaban (ELIQUIS) 5 mg (74 tabs) starter pack  Take 10 mg (two 5 mg tablets) by mouth twice a day for 7 days    Followed by 5 mg (one 5 mg tablet) by mouth twice a day  1 Dose Pack  0           ?  omeprazole (PRILOSEC) 20 mg capsule  Take 20 mg by mouth daily.         ?  ondansetron hcl (ZOFRAN) 4 mg tablet  Take 1 Tab by mouth every eight (8) hours as needed for Nausea.  15 Tab  0     ?  cyclobenzaprine (FLEXERIL) 10 mg tablet  Take 10 mg by mouth three (3) times daily as needed for Muscle Spasm(s).         ?  sucralfate (CARAFATE) 100 mg/mL suspension  Take 1 tsp by mouth two (2) times a day.         ?  ascorbic acid, vitamin C, (VITAMIN C) 250 mg tablet  Take 1 Tab by mouth two (2) times a day. Take with iron pill  60 Tab  3     ?  simvastatin (ZOCOR) 80 mg tablet  Take 80 mg by mouth nightly.         ?  lamoTRIgine (LAMICTAL) 100 mg tablet  Take 1 Tab by mouth two (2) times a day.  60 Tab  0           ?  Cholecalciferol, Vitamin D3, (VITAMIN D) 1,000 unit Cap  Take 50,000 Units by mouth every Sunday.                 Allergies           Allergies        Allergen  Reactions         ?  Tape [Adhesive]  Itching             Paper tape             ?  Aspirin  Not Reported This Time             Because of hx ulcers per patient         ?  Opana [Oxymorphone]  Rash and Itching         ?  Tylenol [Acetaminophen]  Itching             Physical Exam          ED Triage Vitals [02/12/20 2057]     ED Encounter Vitals Group           BP  (!) 150/93        Pulse (Heart Rate)  94        Resp Rate  18        Temp  98.1 ??F (36.7 ??C)        Temp src  O2 Sat (%)  96 %        Weight             Height          Physical Exam   Vitals and nursing note reviewed.   Constitutional:        Appearance: She is not ill-appearing.   HENT :       Head: Normocephalic and atraumatic.      Nose: Nose normal.      Mouth/Throat:      Mouth: Mucous membranes are moist.    Eyes:       Pupils: Pupils are equal, round, and reactive to light.   Cardiovascular:       Rate and Rhythm: Normal rate and regular rhythm.      Pulses: Normal pulses.    Pulmonary:       Effort: Pulmonary effort is normal.      Breath sounds: Normal breath sounds.   Abdominal :      General: Bowel sounds are normal. There is no distension.      Palpations: Abdomen is soft.      Tenderness: There is no abdominal tenderness.     Musculoskeletal:          General: Swelling, tenderness  and signs of injury present.      Cervical back: Normal range of motion and neck supple.      Left lower leg:  Edema present.      Comments: Patient has mild left knee swelling with erythema and abrasion to the patellar surface.  The left knee  joint is generally tender throughout, with increased pain upon extension however she does have full range of motion.  The left ankle is edematous without tenderness palpation of the posterior bilateral malleolus or fifth metatarsal head.  Patient has  tenderness of the left hip without bruising or soft tissue swelling, pelvis rock stable, chest wall stable.  Dorsalis pedis pulses are  intact bilaterally.  There is no long bone or axial skeleton tenderness to palpation deformity step-off bruising or  soft tissue swelling except for elsewhere noted.  There is no cervical or sacral spine tenderness to palpation deformity or step-off.    Skin:      General: Skin is warm and dry.      Capillary Refill: Capillary refill takes less than 2 seconds.    Neurological:       Mental Status: She is alert and oriented to person, place, and time.               Impression and Management Plan     69 year old female presents with left knee pain and left hip tenderness to palpation following a fall yesterday evening.  She is on Eliquis, did not hit her head, not lose consciousness  has no new focal deficits no signs of cauda equina.  Will obtain x-rays of left knee and hip, treat with her home oxycodone that she states she could not find while in the department, discharged to follow-up with her primary care provider and orthopedic  provider.        Diagnostic Studies     Lab:    No results found for this or any previous visit (from the past 12 hour(s)).   Labs Reviewed - No data to display      Imaging:     XR HIP LT W OR WO PELV 2-3 VWS      Result Date: 02/12/2020  Indication: Larey Seat.       IMPRESSION: 2 views of the left hip and bony pelvis reveal no fracture or dislocation. There are mild degenerative changes about the symphysis pubis and lower lumbar spine.       XR KNEE LT MIN 4 V      Result Date: 02/12/2020   Indication: Larey Seat.       IMPRESSION: 4 views of the left knee reveal very early osteoarthritis of the medial compartment. Remainder the examination is unremarkable. No fracture, dislocation or effusion.       Left hip and left knee x-rays as interpreted by Dr. Christiana Pellant and myself shows no fracture or dislocation.     ED Course               Medications       oxyCODONE ER (OxyCONTIN) tablet 10 mg (has no administration in time range)          Medical Decision Making     69 year old female with left  knee pain and swelling, and left hip tenderness palpation after fall yesterday, no fracture /dislocation noted on imaging.  We believe this patient is  at greater fall risk which she has a history of recurrent falls, if we placed her in a knee immobilizer however she does want something to stabilize her at night so we have given her 1 to take with her and wear at night with instructions regarding same.   She was provided with an Ace wrap for comfort around her left knee, encouraged to use her walker for ambulation, she was given her home oxycodone while in the department, and given return precautions which she verbalized understanding, and encouraged  to follow-up with her primary care provider and her orthopedic provider.  She is discharged in hemodynamically stable condition.        Final Diagnosis                 ICD-10-CM  ICD-9-CM          1.  Injury of left knee, initial encounter   S89.92XA  959.7          2.  Fall, initial encounter   [id number] Skeens  B096.2             Disposition     Discharged to home         The patient was personally evaluated by myself and discussed with Dr. Carmela Hurt, Carlis Stable, MD who agrees with the above assessment and plan.   Brett Canales, PA-C   February 12, 2020      My signature above authenticates this document and my orders, the final     diagnosis (es), discharge prescription (s), and instructions in the Epic     record.   If you have any questions please contact [phone].       Nursing notes have been reviewed by the physician/ advanced practice     Clinician.      Dragon medical dictation software was used for portions of this report. Unintended voice recognition errors may occur.

## 2020-02-12 NOTE — ED Notes (Signed)
10:49 PM  02/12/20     Discharge instructions given to pt (name) with verbalization of understanding. Patient accompanied by family.  Patient discharged with the following prescriptions none. Patient discharged to home (destination).      Bynum Bellows, RN

## 2020-02-13 ENCOUNTER — Inpatient Hospital Stay
Admit: 2020-02-13 | Discharge: 2020-02-13 | Disposition: A | Discharge status: Home / Self Care | Payer: PRIVATE HEALTH INSURANCE | Attending: Emergency Medicine

## 2020-02-13 MED ORDER — OXYCODONE ER 10 MG TABLET,CRUSH RESISTANT,EXTENDED RELEASE 12 HR
10 mg | Freq: Once | ORAL | Status: AC
Start: 2020-02-13 — End: 2020-02-12
  Administered 2020-02-13: 03:00:00 via ORAL

## 2020-02-13 MED FILL — OXYCONTIN 10 MG TABLET,CRUSH RESISTANT,EXTENDED RELEASE: 10 mg | ORAL | Qty: 1

## 2020-04-11 ENCOUNTER — Inpatient Hospital Stay
Admit: 2020-04-11 | Discharge: 2020-04-11 | Disposition: A | Discharge status: Home / Self Care | Payer: PRIVATE HEALTH INSURANCE | Attending: Emergency Medicine

## 2020-04-11 ENCOUNTER — Emergency Department: Admit: 2020-04-11 | Payer: PRIVATE HEALTH INSURANCE | Primary: Internal Medicine

## 2020-04-11 DIAGNOSIS — R109 Unspecified abdominal pain: Secondary | ICD-10-CM

## 2020-04-11 LAB — CBC WITH AUTOMATED DIFF
BASOPHILS: 0.7 % (ref 0–3)
EOSINOPHILS: 3.2 % (ref 0–5)
HCT: 33.3 % — ABNORMAL LOW (ref 37.0–50.0)
HGB: 9.9 gm/dl — ABNORMAL LOW (ref 13.0–17.2)
IMMATURE GRANULOCYTES: 0.2 % (ref 0.0–3.0)
LYMPHOCYTES: 28.8 % (ref 28–48)
MCH: 24.3 pg — ABNORMAL LOW (ref 25.4–34.6)
MCHC: 29.7 gm/dl — ABNORMAL LOW (ref 30.0–36.0)
MCV: 81.8 fL (ref 80.0–98.0)
MONOCYTES: 6.2 % (ref 1–13)
MPV: 10.8 fL — ABNORMAL HIGH (ref 6.0–10.0)
NEUTROPHILS: 60.9 % (ref 34–64)
NRBC: 0 (ref 0–0)
PLATELET: 261 10*3/uL (ref 140–450)
RBC: 4.07 M/uL (ref 3.60–5.20)
RDW-SD: 54 — ABNORMAL HIGH (ref 36.4–46.3)
WBC: 5.7 10*3/uL (ref 4.0–11.0)

## 2020-04-11 LAB — METABOLIC PANEL, BASIC
Anion gap: 1 mmol/L — ABNORMAL LOW (ref 5–15)
BUN: 11 mg/dl (ref 7–25)
CO2: 30 mEq/L (ref 21–32)
Calcium: 8.9 mg/dl (ref 8.5–10.1)
Chloride: 111 mEq/L — ABNORMAL HIGH (ref 98–107)
Creatinine: 0.7 mg/dl (ref 0.6–1.3)
GFR est AA: >60
GFR est non-AA: >60
Glucose: 83 mg/dl (ref 74–106)
Potassium: 3.8 mEq/L (ref 3.5–5.1)
Sodium: 142 mEq/L (ref 136–145)

## 2020-04-11 LAB — POC URINE MACROSCOPIC
Bilirubin, Urine: NEGATIVE
Bilirubin: NEGATIVE
Blood, Urine: NEGATIVE
Blood: NEGATIVE
Glucose, Ur: NEGATIVE mg/dl
Glucose: NEGATIVE mg/dl
Ketone: NEGATIVE mg/dl
Ketones, Urine: NEGATIVE mg/dl
Nitrite, Urine: NEGATIVE
Nitrites: NEGATIVE
Protein, UA: NEGATIVE mg/dl
Protein: NEGATIVE mg/dl
Specific Gravity, UA: 1.015 (ref 1.005–1.030)
Specific gravity: 1.015 (ref 1.005–1.030)
Urobilinogen, UA, POCT: 0.2 EU/dl (ref 0.0–1.0)
Urobilinogen: 0.2 EU/dl (ref 0.0–1.0)
pH (UA): 6 (ref 5–9)
pH, UA: 6 (ref 5–9)

## 2020-04-11 LAB — POC URINE MICROSCOPIC

## 2020-04-11 LAB — BASIC METABOLIC PANEL
Anion Gap: 1 mmol/L — ABNORMAL LOW (ref 5–15)
BUN: 11 mg/dl (ref 7–25)
CO2: 30 mEq/L (ref 21–32)
Calcium: 8.9 mg/dl (ref 8.5–10.1)
Chloride: 111 mEq/L — ABNORMAL HIGH (ref 98–107)
Creatinine: 0.7 mg/dl (ref 0.6–1.3)
EGFR IF NonAfrican American: >60
GFR African American: >60
Glucose: 83 mg/dl (ref 74–106)
Potassium: 3.8 mEq/L (ref 3.5–5.1)
Sodium: 142 mEq/L (ref 136–145)

## 2020-04-11 LAB — CBC WITH AUTO DIFFERENTIAL
Basophils %: 0.7 % (ref 0–3)
Eosinophils %: 3.2 % (ref 0–5)
Hematocrit: 33.3 % — ABNORMAL LOW (ref 37.0–50.0)
Hemoglobin: 9.9 gm/dl — ABNORMAL LOW (ref 13.0–17.2)
Immature Granulocytes: 0.2 % (ref 0.0–3.0)
Lymphocytes %: 28.8 % (ref 28–48)
MCH: 24.3 pg — ABNORMAL LOW (ref 25.4–34.6)
MCHC: 29.7 gm/dl — ABNORMAL LOW (ref 30.0–36.0)
MCV: 81.8 fL (ref 80.0–98.0)
MPV: 10.8 fL — ABNORMAL HIGH (ref 6.0–10.0)
Monocytes %: 6.2 % (ref 1–13)
Neutrophils %: 60.9 % (ref 34–64)
Nucleated RBCs: 0 (ref 0–0)
Platelets: 261 10*3/uL (ref 140–450)
RBC: 4.07 M/uL (ref 3.60–5.20)
RDW-SD: 54 — ABNORMAL HIGH (ref 36.4–46.3)
WBC: 5.7 10*3/uL (ref 4.0–11.0)

## 2020-04-11 MED ORDER — OXYCODONE 5 MG TAB
5 mg | ORAL | Status: AC
Start: 2020-04-11 — End: 2020-04-11
  Administered 2020-04-11: 16:00:00 via ORAL

## 2020-04-11 MED ORDER — ONDANSETRON 4 MG TAB, RAPID DISSOLVE
4 mg | ORAL | Status: AC
Start: 2020-04-11 — End: 2020-04-11
  Administered 2020-04-11: 16:00:00 via SUBLINGUAL

## 2020-04-11 MED ORDER — ONDANSETRON (PF) 4 MG/2 ML INJECTION
4 mg/2 mL | Freq: Once | INTRAMUSCULAR | Status: AC
Start: 2020-04-11 — End: 2020-04-11
  Administered 2020-04-11: 18:00:00 via INTRAVENOUS

## 2020-04-11 MED ORDER — MORPHINE 4 MG/ML SYRINGE
4 mg/mL | INTRAMUSCULAR | Status: AC
Start: 2020-04-11 — End: 2020-04-11
  Administered 2020-04-11: 18:00:00 via INTRAVENOUS

## 2020-04-11 MED FILL — MORPHINE 4 MG/ML SYRINGE: 4 mg/mL | INTRAMUSCULAR | Qty: 1

## 2020-04-11 MED FILL — OXYCODONE 5 MG TAB: 5 mg | ORAL | Qty: 2

## 2020-04-11 MED FILL — ONDANSETRON (PF) 4 MG/2 ML INJECTION: 4 mg/2 mL | INTRAMUSCULAR | Qty: 2

## 2020-04-11 MED FILL — ONDANSETRON 4 MG TAB, RAPID DISSOLVE: 4 mg | ORAL | Qty: 1

## 2020-04-11 NOTE — ED Notes (Signed)
Patient resting quietly, daughter at bedside.

## 2020-04-11 NOTE — ED Provider Notes (Signed)
[facility]  Emergency Department Treatment Report    Patient: [name] [name] Age: 69 y.o. Sex: female    Date of Birth: 1951-07-29 Admit Date: 04/11/2020 PCP: Otto Herb, MD   MRN: [id number]  CSN: [id number]     Room: ER08/ER08 Time Dictated: 11:40 AM        Chief Complaint   Back pain, left leg pain  History of Present Illness   69 y.o. female with history of chronic pain syndrome, fibromyalgia, lupus, chronic low back pain presents with acute low back pain on left side radiating to left buttock and posterior left leg.  She reports that she awoke with this pain this morning.  She denies fall or injury.  No focal weakness or sensory changes.  There has been no incontinence or retention of bowel or bladder.  Patient took her cyclobenzaprine this morning but did not take oxycodone which is prescribed by her pain management doctor.  She denies abdominal pain, fever, vomiting, dysuria, hematuria.    Review of Systems   Review of Systems   Constitutional: Negative for chills and fever.   HENT: Negative for sore throat.    Respiratory: Negative for cough and shortness of breath.    Cardiovascular: Negative for chest pain and leg swelling.   Gastrointestinal: Negative for abdominal pain, nausea and vomiting.   Genitourinary: Negative for dysuria and flank pain.   Musculoskeletal: Positive for back pain. Negative for joint pain and myalgias (Pain in left buttock and posterior left leg.).   Skin: Negative for itching and rash.   Neurological: Negative for dizziness, focal weakness and headaches.   Psychiatric/Behavioral: Negative for depression. The patient is not nervous/anxious.        Past Medical/Surgical History     Past Medical History:   Diagnosis Date   ??? Acute hypercapnic respiratory failure (HCC) 02/05/2013    W/ Encephalopathy requiring BiPAP, Etiology Uncertain however Possible Narcotic Benzodiazipine Related?    ??? Adjustment disorder with mixed anxiety and depressed mood    ??? Aneurysm  of internal carotid artery    ??? Arthritis of knee    ??? Asthma    ??? Autoimmune disease (HCC)    ??? CAD (coronary artery disease)    ??? Cervicalgia    ??? Chest pain 03/17/2014    Dobutamine Stress Echo: NORMAL WALL MOTION AND GLOBAL SYSTOLIC FUNCTION AT REST AND AFTER?? STRESS/EXERCISE WITH APPROPRIATE AUGMENTATION OF FUNCTION IN ALL SEGMENTS. NO EVIDENCE OF INDUCIBLE ISCHEMIA AT ADEQUATE HEART RATE WITH DOBUTAMINE STRESS.??            ??? Chronic low back pain 10/22/2014    [facility] MRI Lumbar w/o contrast: Moderate facet arthropathy at L4-L5 and L5-S1. Mild/moderate foraminal stenosis at these levels. Disc bulge with annular tear at L4-L5.    ??? Chronic pain syndrome    ??? Coccydynia    ??? Diabetes mellitus     NIDDM   ??? DVT (deep venous thrombosis) (HCC) 05/19/2007    [facility] CTA: 1. Small nonocclusive pulmonary embolus in a subsegmental branch to the left lower lobe. Larger filling defect in a segmental branch to the right lower lobe, not as low in attenuation as is normally seen with a pulmonary embolus, however is seen in multiple planes and remains concerning for an additional nonocclusive pulmonary embolus.   ??? Fibromyalgia    ??? Gastric ulcer    ??? Gastritis 01/03/2015    [facility] Admission, EGD + H. Pylori/Gastritis   ??? Generalized osteoarthritis of multiple  sites    ??? GI bleed 01/01/2015    [facility] Admit 5/2-01/07/2015   ??? H. pylori infection 01/03/2015    [facility] Admission, EGD + H. Pylori/Gastritis   ??? Hemiparesis, right (HCC)    ??? Hyperlipidemia    ??? Hypertension    ??? Irregular sleep-wake rhythm    ??? Knee joint pain    ??? Lumbar spondylosis     Orthopedic Spine Surgeon: Dr. Ree Kida L. Henrene Hawking    ??? Muscle spasm    ??? Normocytic anemia     Chronic Iron Deficiency Anemia   ??? Osteoporosis    ??? Pancreatitis    ??? Peripheral neuropathy    ??? Pulmonary embolism (HCC)     Multiple   ??? Seizures (HCC)    ??? Sensory ataxia    ??? SLE (systemic lupus erythematosus) (HCC)    ??? Stroke [facility])    ??? Subclinical hypothyroidism    ??? Vertigo    ??? Vitamin D  deficiency      Past Surgical History:   Procedure Laterality Date   ??? HX CHOLECYSTECTOMY     ??? HX GI      Partial Gastrectomy    ??? HX GI  01/03/2015    [facility] EGD by Dr. Smitty Cords D. Waldholtz: S/P B-2 Gastric Resection, Gastritis in small remnant, Bx of Jejunum r/o Celiac Disease and of Stomach. + Gastritis + H. Pylori   ??? HX HYSTERECTOMY     ??? PR CARDIAC SURG PROCEDURE UNLIST      Cardiac Cath PCI w/ Stents       Social History     Social History     Socioeconomic History   ??? Marital status: DIVORCED     Spouse name: Not on file   ??? Number of children: Not on file   ??? Years of education: Not on file   ??? Highest education level: Not on file   Tobacco Use   ??? Smoking status: Former Smoker   ??? Smokeless tobacco: Never Used   Substance and Sexual Activity   ??? Alcohol use: No   ??? Drug use: No   ??? Sexual activity: Not Currently     Partners: Male     Social Determinants of Health     Financial Resource Strain:    ??? Difficulty of Paying Living Expenses:    Food Insecurity:    ??? Worried About Programme researcher, broadcasting/film/video in the Last Year:    ??? Barista in the Last Year:    Transportation Needs:    ??? Freight forwarder (Medical):    ??? Lack of Transportation (Non-Medical):    Physical Activity:    ??? Days of Exercise per Week:    ??? Minutes of Exercise per Session:    Stress:    ??? Feeling of Stress :    Social Connections:    ??? Frequency of Communication with Friends and Family:    ??? Frequency of Social Gatherings with Friends and Family:    ??? Attends Religious Services:    ??? Database administrator or Organizations:    ??? Attends Engineer, structural:    ??? Marital Status:        Family History     Family History   Problem Relation Age of Onset   ??? Diabetes Maternal Grandmother    ??? Heart Disease Maternal Grandmother    ??? Heart Disease Mother        Current Medications  Current Outpatient Medications   Medication Sig Dispense Refill   ??? gabapentin (NEURONTIN) 100 mg capsule Take 1 Cap by mouth two (2) times a day. Max  Daily Amount: 200 mg. 60 Cap 0   ??? naloxone (NARCAN) 0.4 mg/mL injection 0.25 mL by IntraVENous route as needed for Other (Give if breaths per minute is less than or equal to 8, may repeat dose in 15 minutes and contact MD.). 1 mL 0   ??? calcium carbonate (CALTREX) 600 mg calcium (1,500 mg) tablet Take 600 mg by mouth daily.     ??? apixaban (ELIQUIS) 5 mg (74 tabs) starter pack Take 10 mg (two 5 mg tablets) by mouth twice a day for 7 days   Followed by 5 mg (one 5 mg tablet) by mouth twice a day 1 Dose Pack 0   ??? omeprazole (PRILOSEC) 20 mg capsule Take 20 mg by mouth daily.     ??? ondansetron hcl (ZOFRAN) 4 mg tablet Take 1 Tab by mouth every eight (8) hours as needed for Nausea. 15 Tab 0   ??? cyclobenzaprine (FLEXERIL) 10 mg tablet Take 10 mg by mouth three (3) times daily as needed for Muscle Spasm(s).     ??? sucralfate (CARAFATE) 100 mg/mL suspension Take 1 tsp by mouth two (2) times a day.     ??? ascorbic acid, vitamin C, (VITAMIN C) 250 mg tablet Take 1 Tab by mouth two (2) times a day. Take with iron pill 60 Tab 3   ??? simvastatin (ZOCOR) 80 mg tablet Take 80 mg by mouth nightly.     ??? lamoTRIgine (LAMICTAL) 100 mg tablet Take 1 Tab by mouth two (2) times a day. 60 Tab 0   ??? Cholecalciferol, Vitamin D3, (VITAMIN D) 1,000 unit Cap Take 50,000 Units by mouth every Sunday.         Allergies     Allergies   Allergen Reactions   ??? Tape [Adhesive] Itching     Paper tape      ??? Aspirin Not Reported This Time     Because of hx ulcers per patient   ??? Opana [Oxymorphone] Rash and Itching   ??? Tylenol [Acetaminophen] Itching       Physical Exam     ED Triage Vitals   ED Encounter Vitals Group      BP 04/11/20 1111 133/85      Pulse (Heart Rate) 04/11/20 1137 75      Resp --       Temp 04/11/20 1110 98.9 ??F (37.2 ??C)      Temp src --       O2 Sat (%) 04/11/20 1109 100 %      Weight --       Height --      Physical Exam  Vitals and nursing note reviewed.   Constitutional:       General: She is in acute distress.      Comments:  Appears in pain.  Lying on right side.  Crying.   HENT:      Head: Normocephalic and atraumatic.   Cardiovascular:      Rate and Rhythm: Normal rate and regular rhythm.      Pulses: Normal pulses.   Pulmonary:      Effort: Pulmonary effort is normal. No respiratory distress.      Breath sounds: Normal breath sounds.   Abdominal:      General: Abdomen is flat. Bowel sounds are normal. There is no distension.  Tenderness: There is no abdominal tenderness.   Musculoskeletal:      Lumbar back: Tenderness (Tenderness along left paraspinous muscles and into the left buttock.  No skin changes.) present. No edema, deformity or bony tenderness.   Skin:     General: Skin is warm and dry.   Neurological:      Mental Status: She is alert.      Sensory: No sensory deficit.      Motor: No weakness.      Deep Tendon Reflexes: Babinski sign absent on the right side. Babinski sign absent on the left side.      Reflex Scores:       Patellar reflexes are 1+ on the right side and 1+ on the left side.       Achilles reflexes are 1+ on the right side and 1+ on the left side.          Impression and Management Plan   Chronic pain syndrome including low back.  Acute pain today.  Used Flexeril but not her prescribed pain medication.  She is on oxycodone at home.  Will give dose of oxycodone and reevaluate.  Consider also possibility of renal colic.  Check labs and CT.    DDX considered but not limited to musculoskeletal, sciatica, acute on chronic pain, renal colic, obstructing stone.  Diagnostic Studies   Lab:   Recent Results (from the past 12 hour(s))   CBC WITH AUTOMATED DIFF    Collection Time: 04/11/20  1:25 PM   Result Value Ref Range    WBC 5.7 4.0 - 11.0 1000/mm3    RBC 4.07 3.60 - 5.20 M/uL    HGB 9.9 (L) 13.0 - 17.2 gm/dl    HCT 16.133.3 (L) 09.637.0 - 50.0 %    MCV 81.8 80.0 - 98.0 fL    MCH 24.3 (L) 25.4 - 34.6 pg    MCHC 29.7 (L) 30.0 - 36.0 gm/dl    PLATELET 045261 409140 - 811450 1000/mm3    MPV 10.8 (H) 6.0 - 10.0 fL    RDW-SD 54.0 (H)  36.4 - 46.3      NRBC 0 0 - 0      IMMATURE GRANULOCYTES 0.2 0.0 - 3.0 %    NEUTROPHILS 60.9 34 - 64 %    LYMPHOCYTES 28.8 28 - 48 %    MONOCYTES 6.2 1 - 13 %    EOSINOPHILS 3.2 0 - 5 %    BASOPHILS 0.7 0 - 3 %   METABOLIC PANEL, BASIC    Collection Time: 04/11/20  1:25 PM   Result Value Ref Range    Sodium 142 136 - 145 mEq/L    Potassium 3.8 3.5 - 5.1 mEq/L    Chloride 111 (H) 98 - 107 mEq/L    CO2 30 21 - 32 mEq/L    Glucose 83 74 - 106 mg/dl    BUN 11 7 - 25 mg/dl    Creatinine 0.7 0.6 - 1.3 mg/dl    GFR est AA >91.4>60.0      GFR est non-AA >60      Calcium 8.9 8.5 - 10.1 mg/dl    Anion gap 1 (L) 5 - 15 mmol/L   POC URINE MACROSCOPIC    Collection Time: 04/11/20  2:17 PM   Result Value Ref Range    Glucose Negative NEGATIVE,Negative mg/dl    Bilirubin Negative NEGATIVE,Negative      Ketone Negative NEGATIVE,Negative mg/dl    Specific gravity 7.8291.015 1.005 -  1.030      Blood Negative NEGATIVE,Negative      pH (UA) 6.0 5 - 9      Protein Negative NEGATIVE,Negative mg/dl    Urobilinogen 0.2 0.0 - 1.0 EU/dl    Nitrites Negative NEGATIVE,Negative      Leukocyte Esterase Small (A) NEGATIVE,Negative      Color Yellow      Appearance Clear     POC URINE MICROSCOPIC    Collection Time: 04/11/20  2:17 PM   Result Value Ref Range    Epithelial cells, squamous 1-4 /LPF    WBC 1-4 /HPF    Bacteria OCCASIONAL /HPF       Imaging:    CT ABD PELV WO CONT    Result Date: 04/11/2020  EXAM: CT ABD PELV WO CONT INDICATION: Left flank pain TECHNIQUE: Axial CT scan of the abdomen and pelvis without   IV contrast including coronal and sagittal reconstructions.  All CT exams at this facility use one or more dose reduction techniques including automatic exposure control, mA/kV adjustment per patient's size, or iterative reconstruction technique. COMPARISON: 11/13/2017 FINDINGS: Lower thorax: No acute findings ABDOMEN: Liver: Unremarkable. No mass. Gallbladder and bile ducts: Cholecystectomy. Continued marked bile duct dilatation. The common bile  duct measures 2.9 cm. Pancreas: Unremarkable. No ductal dilatation. No mass. Spleen: Unremarkable. No splenomegaly. Adrenals: Unremarkable. No mass. Kidneys and ureters: 2.1 cm right renal cyst. No obstructive uropathy. Appendix: No findings to suggest appendicitis. Stomach and bowel: Gastric bypass procedure.. No obstruction. No mucosal thickening. No inflammatory changes. Considerable constipation. Peritoneum: Unremarkable. No significant fluid collection. No free air. Lymph nodes: Unremarkable. No enlarged lymph nodes. Vasculature: Unremarkable. No aortic aneurysm. Bones: No acute fracture. Abdominal wall: Unremarkable. No evidence of a hernia. PELVIS: Bladder: Unremarkable. No mass. Reproductive: Hysterectomy..     IMPRESSION: 1. Continued marked bile duct dilatation. The common bile duct measures 2.9 cm cholecystectomy. 2. Gastric bypass procedure. 3. Hysterectomy. 4. No obstructive uropathy or hydronephrosis..       Medical Decision Making/ED Course   Patient.  She is much improved after symptomatic medications CT abdomen pelvis performed obstructing renal stone.  No obstructing stones seen.  Chronic findings as noted above.  She is comfortable and eating.  She has pain medications at home which she will take as prescribed.  Follow-up with primary care and her pain management doctors.    Final Diagnosis       ICD-10-CM ICD-9-CM   1. Flank pain  R10.9 789.09   2. Left-sided low back pain with left-sided sciatica, unspecified chronicity  M54.42 724.3     Disposition   Discharge    Erling Conte, M.D.  April 11, 2020    *Portions of this electronic record were dictated using Conservation officer, historic buildings.  Unintended errors in translation may occur.    My signature above authenticates this document and my orders, the final    diagnosis (es), discharge prescription (s), and instructions in the Epic    record.  If you have any questions please contact [phone].     Nursing notes have been reviewed by the  physician/ advanced practice    Clinician.

## 2020-04-11 NOTE — ED Notes (Signed)
Patient given warm blanket for comfort.

## 2020-04-11 NOTE — ED Notes (Signed)
Came in by EMS   Pt states back pain radiating all the way down the left leg      Pt could not get out of bed this morning  Cannot lay on left hand side

## 2020-04-11 NOTE — ED Notes (Signed)
Report given to Neisha, RN

## 2020-04-11 NOTE — ED Notes (Signed)
Spoke with PICC team about placing IV in for [name] [name].

## 2020-05-17 ENCOUNTER — Inpatient Hospital Stay: Admit: 2020-05-17 | Payer: PRIVATE HEALTH INSURANCE | Primary: Internal Medicine

## 2020-05-17 ENCOUNTER — Encounter

## 2020-05-17 DIAGNOSIS — M8588 Other specified disorders of bone density and structure, other site: Secondary | ICD-10-CM

## 2020-06-26 ENCOUNTER — Emergency Department: Payer: PRIVATE HEALTH INSURANCE | Primary: Internal Medicine

## 2020-06-26 ENCOUNTER — Inpatient Hospital Stay
Admit: 2020-06-26 | Discharge: 2020-06-27 | Disposition: A | Discharge status: Home / Self Care | Payer: PRIVATE HEALTH INSURANCE | Attending: Emergency Medicine

## 2020-06-26 ENCOUNTER — Emergency Department: Admit: 2020-06-27 | Payer: PRIVATE HEALTH INSURANCE | Primary: Internal Medicine

## 2020-06-26 ENCOUNTER — Emergency Department: Admit: 2020-06-26 | Payer: PRIVATE HEALTH INSURANCE | Primary: Internal Medicine

## 2020-06-26 DIAGNOSIS — S7002XA Contusion of left hip, initial encounter: Secondary | ICD-10-CM

## 2020-06-26 MED ORDER — ONDANSETRON (PF) 4 MG/2 ML INJECTION
4 mg/2 mL | Freq: Once | INTRAMUSCULAR | Status: AC
Start: 2020-06-26 — End: 2020-06-26
  Administered 2020-06-27: 01:00:00 via INTRAVENOUS

## 2020-06-26 MED ORDER — MORPHINE 4 MG/ML SYRINGE
4 mg/mL | INTRAMUSCULAR | Status: AC
Start: 2020-06-26 — End: 2020-06-26
  Administered 2020-06-27: 01:00:00 via INTRAVENOUS

## 2020-06-26 MED ORDER — SODIUM CHLORIDE 0.9 % IJ SYRG
Freq: Once | INTRAMUSCULAR | Status: AC
Start: 2020-06-26 — End: 2020-06-26
  Administered 2020-06-27: 01:00:00 via INTRAVENOUS

## 2020-06-26 NOTE — ED Notes (Signed)
Her via EMS medic 9    Larey Seat in the living room    No obvious deformities; c/o left hip and left flank

## 2020-06-26 NOTE — ED Notes (Signed)
Pt placed on bed pan.

## 2020-06-26 NOTE — ED Notes (Signed)
Pt is difficult IV stick. Pt had one IV placed however it did not last for CT scan to be completed. CT attempted to place IV X2 without success, ER tech and another ER RN attempted to place US guided IV without success. MD made aware of difficulty with IV placement. Per MD ok to change to CT w/o contrast. CT made aware and came to pick up pt to complete imaging.

## 2020-06-26 NOTE — ED Provider Notes (Signed)
ED Provider Notes by Loa Socks, MD at 06/26/20 1919                Author: Loa Socks, MD  Service: Emergency Medicine  Author Type: Physician       Filed: 06/27/20 0219  Date of Service: 06/26/20 1919  Status: Signed          Editor: Loa Socks, MD (Physician)               HPI    69 year old female with history of CAD, chronic low back pain, chronic pain syndrome, diabetes, DVT on Eliquis, fibromyalgia, stroke, seizures, SLE, PE, internal carotid artery aneurysm, asthma, ataxia who was brought by EMS due to a fall.  Patient states  that around 4 PM she was standing up from the couch when she turned to the left side, lost her balance and fell over her left hip.  Patient was not able to stand up and waited around 1 hour when her daughter arrived to the house and they called EMS.   Patient reports left hip pain, left thigh pain, left knee pain, left lower back pain and left thigh numbness since the fall.  Patient also reports that she is supposed to use a walker but she was not using it when she fell. Patient denies head trauma,  loss of consciousness, blurred vision, dizziness, nausea or vomiting, chest pain, shortness of breath, abdominal pain, focal weakness, or other complaints.       Past Medical History:        Diagnosis  Date         ?  Acute hypercapnic respiratory failure (HCC)  02/05/2013          W/ Encephalopathy requiring BiPAP, Etiology Uncertain however Possible Narcotic Benzodiazipine Related?          ?  Adjustment disorder with mixed anxiety and depressed mood       ?  Aneurysm of internal carotid artery       ?  Arthritis of knee       ?  Asthma       ?  Autoimmune disease (HCC)       ?  CAD (coronary artery disease)       ?  Cervicalgia       ?  Chest pain  03/17/2014          Dobutamine Stress Echo: NORMAL WALL MOTION AND GLOBAL SYSTOLIC FUNCTION AT REST AND AFTER?? STRESS/EXERCISE WITH APPROPRIATE AUGMENTATION OF FUNCTION  IN ALL SEGMENTS. NO  EVIDENCE OF INDUCIBLE ISCHEMIA AT ADEQUATE HEART RATE WITH DOBUTAMINE STRESS.??                  ?  Chronic low back pain  10/22/2014          [facility] MRI Lumbar w/o contrast: Moderate facet arthropathy at L4-L5 and L5-S1. Mild/moderate foraminal stenosis at these levels. Disc bulge with annular  tear at L4-L5.          ?  Chronic pain syndrome       ?  Coccydynia       ?  Diabetes mellitus            NIDDM         ?  DVT (deep venous thrombosis) (HCC)  05/19/2007          [facility] CTA: 1. Small nonocclusive pulmonary embolus in a subsegmental branch to the left lower lobe. Larger filling defect in a  segmental branch to  the right lower lobe, not as low in attenuation as is normally seen with a pulmonary embolus, however is seen in multiple planes and remains concerning for an additional nonocclusive pulmonary embolus.         ?  Fibromyalgia       ?  Gastric ulcer       ?  Gastritis  01/03/2015          [facility] Admission, EGD + H. Pylori/Gastritis         ?  Generalized osteoarthritis of multiple sites       ?  GI bleed  01/01/2015          [facility] Admit 5/2-01/07/2015         ?  H. pylori infection  01/03/2015          [facility] Admission, EGD + H. Pylori/Gastritis         ?  Hemiparesis, right (HCC)       ?  Hyperlipidemia       ?  Hypertension       ?  Irregular sleep-wake rhythm       ?  Knee joint pain       ?  Lumbar spondylosis            Orthopedic Spine Surgeon: Dr. Ree KidaJack L. Henrene HawkingSiegel          ?  Muscle spasm       ?  Normocytic anemia            Chronic Iron Deficiency Anemia         ?  Osteoporosis       ?  Pancreatitis       ?  Peripheral neuropathy       ?  Pulmonary embolism (HCC)            Multiple         ?  Seizures (HCC)       ?  Sensory ataxia       ?  SLE (systemic lupus erythematosus) (HCC)       ?  Stroke [facility](HCC)       ?  Subclinical hypothyroidism       ?  Vertigo           ?  Vitamin D deficiency               Past Surgical History:         Procedure  Laterality  Date          ?  HX CHOLECYSTECTOMY         ?  HX GI               Partial Gastrectomy           ?  HX GI    01/03/2015          [facility] EGD by Dr. Smitty CordsBruce D. Waldholtz: S/P B-2 Gastric Resection, Gastritis in small remnant, Bx of Jejunum r/o Celiac Disease and of Stomach. + Gastritis  + H. Pylori          ?  HX HYSTERECTOMY         ?  PR CARDIAC SURG PROCEDURE UNLIST              Cardiac Cath PCI w/ Stents               Family History:  Problem  Relation  Age of Onset          ?  Diabetes  Maternal Grandmother       ?  Heart Disease  Maternal Grandmother            ?  Heart Disease  Mother               Social History          Socioeconomic History         ?  Marital status:  SINGLE              Spouse name:  Not on file         ?  Number of children:  Not on file     ?  Years of education:  Not on file     ?  Highest education level:  Not on file       Occupational History        ?  Not on file       Tobacco Use         ?  Smoking status:  Former Smoker     ?  Smokeless tobacco:  Never Used       Substance and Sexual Activity         ?  Alcohol use:  No     ?  Drug use:  No     ?  Sexual activity:  Not Currently              Partners:  Male        Other Topics  Concern        ?  Not on file       Social History Narrative        ?  Not on file          Social Determinants of Health          Financial Resource Strain:         ?  Difficulty of Paying Living Expenses:        Food Insecurity:         ?  Worried About Programme researcher, broadcasting/film/video in the Last Year:      ?  Barista in the Last Year:        Transportation Needs:         ?  Freight forwarder (Medical):      ?  Lack of Transportation (Non-Medical):        Physical Activity:         ?  Days of Exercise per Week:      ?  Minutes of Exercise per Session:        Stress:         ?  Feeling of Stress :        Social Connections:         ?  Frequency of Communication with Friends and Family:      ?  Frequency of Social Gatherings with Friends and Family:      ?  Attends Religious Services:      ?  Active Member of  Clubs or Organizations:      ?  Attends Banker Meetings:      ?  Marital Status:        Intimate Partner Violence:         ?  Fear of Current or  Ex-Partner:      ?  Emotionally Abused:      ?  Physically Abused:         ?  Sexually Abused:               ALLERGIES: Tape [adhesive], Aspirin, Opana [oxymorphone], and Tylenol [acetaminophen]      Review of Systems    Constitutional: Negative.     HENT: Negative.     Eyes: Negative.  Negative for visual disturbance.    Respiratory: Negative.  Negative for chest tightness and shortness of breath.     Cardiovascular: Negative.  Negative for chest pain.    Gastrointestinal: Negative.  Negative for abdominal pain, nausea and vomiting.    Genitourinary: Positive for flank pain (left) . Negative for dysuria.    Musculoskeletal: Positive for arthralgias (left hip, left knee) , back pain (left lower)  and gait problem. Negative for neck pain and neck stiffness.    Skin: Negative.     Neurological: Negative for dizziness, weakness, numbness and headaches.    All other systems reviewed and are negative.         There were no vitals filed for this visit.           Physical Exam   Vitals and nursing note reviewed.   Constitutional:        General: She is not in acute distress.     Appearance: She is well-developed. She is obese. She is not ill-appearing, toxic-appearing  or diaphoretic.      Comments: Crying   HENT:       Head: Normocephalic and atraumatic.      Nose: Nose normal.      Mouth/Throat:      Mouth: Mucous membranes are moist.    Eyes:       Extraocular Movements: Extraocular movements intact.      Conjunctiva/sclera: Conjunctivae normal.      Pupils: Pupils are equal, round, and reactive to light.   Cardiovascular :       Rate and Rhythm: Normal rate and regular rhythm.      Pulses: Normal pulses.            Radial pulses are 2+ on the right side and 2+  on the left side.         Dorsalis pedis pulses are 2+ on the right side and 2+  on the left side.    Pulmonary :       Effort: Pulmonary effort is normal.      Breath sounds: Normal breath sounds.   Abdominal :      General: Bowel sounds are normal. There is no distension.      Palpations: Abdomen is soft.      Tenderness: There is no abdominal tenderness. There is no guarding or rebound.     Musculoskeletal:      Right shoulder: Normal.      Left shoulder: Normal.      Right upper arm: Normal.      Left upper arm: Normal.      Right elbow: Normal.      Left elbow:  Normal.      Right forearm: Normal.      Left forearm: Normal.      Right wrist: Normal.      Left wrist: Normal.      Right hand: Normal.      Left hand: Normal.      Cervical back: Normal, full  passive range of motion without  pain, normal range of motion and neck supple. No deformity, signs of trauma, rigidity, tenderness, bony tenderness or crepitus. No pain with movement, spinous process tenderness or muscular tenderness. Normal range of motion.      Thoracic back: No  deformity, spasms, tenderness or bony tenderness. Normal range of motion.      Lumbar back: Spasms and  tenderness present. No swelling, deformity or bony tenderness. Decreased range of motion .        Back:         Right hip: Normal.      Left hip: Tenderness and  bony tenderness present. No deformity, lacerations or crepitus. Decreased range of motion .      Right upper leg: Normal.      Left upper leg: Tenderness and bony  tenderness present. No swelling, edema, deformity or lacerations.      Right knee: Normal.      Left knee: Bony tenderness  present. No swelling, deformity, effusion, erythema, ecchymosis, lacerations or crepitus. Decreased range of motion. Tenderness:  Diffuse. Normal alignment.      Right lower leg: Normal.      Left lower leg: Normal.      Right ankle: Normal.      Left ankle: Normal.      Right foot: Normal.      Left  foot: Normal.    Skin:      General: Skin is warm and dry.      Capillary Refill: Capillary refill takes less than 2 seconds.     Neurological:       General: No focal deficit present.      Mental Status: She is alert and oriented to person, place, and time.      GCS: GCS eye subscore is 4 . GCS verbal subscore is 5. GCS motor subscore is 6 .      Cranial Nerves: Cranial nerves are intact.      Sensory: Sensation is intact.      Motor: Motor function is intact. No weakness or pronator drift.             MDM    The patient presents with fall with a differential diagnosis of fracture, dislocation, intracranial injury, intra-abdominal injury and rhabdomyolysis.         ED Course as of Jun 27 5       Tue Jun 26, 2020        2059  Chronic/stable    HGB(!): 10.2 [TS]              ED Course User Index   [TS] Loa Socks, MD          Recent Results (from the past 12 hour(s))     CBC WITH AUTOMATED DIFF          Collection Time: 06/26/20  8:40 PM         Result  Value  Ref Range            WBC  8.2  4.0 - 11.0 1000/mm3       RBC  4.47  3.60 - 5.20 M/uL       HGB  10.2 (L)  13.0 - 17.2 gm/dl       HCT  82.5 (L)  05.3 - 50.0 %       MCV  79.2 (L)  80.0 - 98.0 fL       MCH  22.8 (L)  25.4 - 34.6 pg       MCHC  28.8 (L)  30.0 - 36.0 gm/dl       PLATELET  130  865 - 450 1000/mm3       MPV  11.0 (H)  6.0 - 10.0 fL       RDW-SD  53.8 (H)  36.4 - 46.3         NRBC  0  0 - 0         IMMATURE GRANULOCYTES  0.4  0.0 - 3.0 %       NEUTROPHILS  65.1 (H)  34 - 64 %       LYMPHOCYTES  23.3 (L)  28 - 48 %       MONOCYTES  7.1  1 - 13 %       EOSINOPHILS  3.4  0 - 5 %       BASOPHILS  0.7  0 - 3 %       HYPOCHROMASIA  1+          Polychromasia  1+          PLATELET COMMENTS  NORMAL          METABOLIC PANEL, BASIC          Collection Time: 06/26/20  8:40 PM         Result  Value  Ref Range            Sodium  140  136 - 145 mEq/L       Potassium  4.5  3.5 - 5.1 mEq/L       Chloride  110 (H)  98 - 107 mEq/L       CO2  26  21 - 32 mEq/L       Glucose  102  74 - 106 mg/dl       BUN  8  7 - 25 mg/dl       Creatinine  0.8  0.6 - 1.3 mg/dl       GFR est AA   >78.4          GFR est non-AA  >60          Calcium  9.3  8.5 - 10.1 mg/dl            Anion gap  4 (L)  5 - 15 mmol/L       CK          Collection Time: 06/26/20  8:40 PM         Result  Value  Ref Range            CK  171  26 - 192 U/L       POC CHEM8          Collection Time: 06/26/20  8:41 PM         Result  Value  Ref Range            Sodium  143  136 - 145 mEq/L       Potassium  4.0  3.5 - 4.9 mEq/L       Chloride  108 (H)  98 - 107 mEq/L       CO2, TOTAL  24  21 - 32 mmol/L       Glucose  108 (H)  74 - 106 mg/dL       BUN  7  7 - 25 mg/dl       Creatinine  0.6  0.6 - 1.3 mg/dl       HCT  37 (L)  38 - 45 %       HGB  12.6  12.4 - 17.2 gm/dl            CALCIUM,IONIZED  4.30 (L)  4.40 - 5.40 mg/dL        XR HIP LT W OR WO PELV 2-3 VWS      Result Date: 06/26/2020   IMPRESSION: No fracture of the pelvis or left hip.       XR FEMUR LT 2 V      Result Date: 06/26/2020   IMPRESSION: No fracture left femur.       XR KNEE LT MAX 2 VWS      Result Date: 06/26/2020   IMPRESSION: No fracture left knee.       CT HEAD WO CONT      Result Date: 06/26/2020   IMPRESSION: Mild atrophy and periventricular microvascular ischemia.       CT SPINE CERV WO CONT      Result Date: 06/26/2020   IMPRESSION: 1. No fracture cervical spine. 2. Degenerative changes cervical spine.       CT ABD PELV WO CONT      Result Date: 06/26/2020   IMPRESSION: 1. No nephrolithiasis or hydronephrosis. 2. Cholecystectomy, hysterectomy and partial gastrectomy 3. 3 cm stable common bile duct dilatation and intrahepatic bile duct dilatation. 4. 2.2 cm right renal cyst.       12:06 AM Blood work-up shows chronic/stable anemia, otherwise unremarkable.  Total CK within normal limits.  Head CT shows mild atrophy and periventricular microvascular ischemia.  Cervical CT showed degenerative changes.  Abdominal pelvic CT shows stable  common bile duct dilation and intrahepatic bile duct dilation.  Right renal cyst.  Left hip/femur/knee x-ray's shows no fracture or  dislocation.  Patient was informed of the results.  Patient reports feeling much better after treatment.  Patient is able  to move her left leg.  I will ambulate the patient with a walker.  If patient is able to ambulate I will discharge home with follow-up with primary doctor.      1:06 AM Patient was able to ambulate with a walker without difficulty. Patient is comfortable in no distress and nontoxic.  No evidence of fracture, dislocation, acute intracranial injury, acute intra-abdominal injury.  No suspicion of acute spinal cord  injury, acute intrathoracic injury.  Patient will be discharged home with instructions to follow with the primary doctor and to return to the ED if no improvement or worsening of the condition.  Patient understood and agreed.       ProceduresNA                  ICD-10-CM  ICD-9-CM          1.  Fall, initial encounter   W19.XXXA  E888.9     2.  Contusion of left hip, initial encounter   S70.02XA  924.01     3.  Contusion of left knee, initial encounter   S80.02XA  924.11          4.  Acute left-sided low back pain without sciatica   M54.50  724.2

## 2020-06-26 NOTE — ED Notes (Signed)
Pt taken to CT/ Xray by transport.

## 2020-06-27 LAB — METABOLIC PANEL, BASIC
Anion gap: 4 mmol/L — ABNORMAL LOW (ref 5–15)
BUN: 8 mg/dl (ref 7–25)
CO2: 26 mEq/L (ref 21–32)
Calcium: 9.3 mg/dl (ref 8.5–10.1)
Chloride: 110 mEq/L — ABNORMAL HIGH (ref 98–107)
Creatinine: 0.8 mg/dl (ref 0.6–1.3)
GFR est AA: >60
GFR est non-AA: >60
Glucose: 102 mg/dl (ref 74–106)
Potassium: 4.5 mEq/L (ref 3.5–5.1)
Sodium: 140 mEq/L (ref 136–145)

## 2020-06-27 LAB — POC CHEM8
BUN: 7 mg/dl (ref 7–25)
BUN: 7 mg/dl (ref 7–25)
CALCIUM,IONIZED: 4.3 mg/dL — ABNORMAL LOW (ref 4.40–5.40)
CALCIUM,IONIZED: 4.3 mg/dL — ABNORMAL LOW (ref 4.40–5.40)
CO2 Total: 24 mmol/L (ref 21–32)
CO2, TOTAL: 24 mmol/L (ref 21–32)
Chloride: 108 mEq/L — ABNORMAL HIGH (ref 98–107)
Chloride: 108 mEq/L — ABNORMAL HIGH (ref 98–107)
Creatinine: 0.6 mg/dl (ref 0.6–1.3)
Creatinine: 0.6 mg/dl (ref 0.6–1.3)
Glucose: 108 mg/dL — ABNORMAL HIGH (ref 74–106)
Glucose: 108 mg/dL — ABNORMAL HIGH (ref 74–106)
HCT: 37 % — ABNORMAL LOW (ref 38–45)
HGB: 12.6 gm/dl (ref 12.4–17.2)
Hematocrit: 37 % — ABNORMAL LOW (ref 38–45)
Hemoglobin: 12.6 gm/dl (ref 12.4–17.2)
Potassium: 4 mEq/L (ref 3.5–4.9)
Potassium: 4 mEq/L (ref 3.5–4.9)
Sodium: 143 mEq/L (ref 136–145)
Sodium: 143 mEq/L (ref 136–145)

## 2020-06-27 LAB — CBC WITH AUTOMATED DIFF
BASOPHILS: 0.7 % (ref 0–3)
EOSINOPHILS: 3.4 % (ref 0–5)
HCT: 35.4 % — ABNORMAL LOW (ref 37.0–50.0)
HGB: 10.2 gm/dl — ABNORMAL LOW (ref 13.0–17.2)
IMMATURE GRANULOCYTES: 0.4 % (ref 0.0–3.0)
LYMPHOCYTES: 23.3 % — ABNORMAL LOW (ref 28–48)
MCH: 22.8 pg — ABNORMAL LOW (ref 25.4–34.6)
MCHC: 28.8 gm/dl — ABNORMAL LOW (ref 30.0–36.0)
MCV: 79.2 fL — ABNORMAL LOW (ref 80.0–98.0)
MONOCYTES: 7.1 % (ref 1–13)
MPV: 11 fL — ABNORMAL HIGH (ref 6.0–10.0)
NEUTROPHILS: 65.1 % — ABNORMAL HIGH (ref 34–64)
NRBC: 0 (ref 0–0)
PLATELET COMMENTS: NORMAL
PLATELET: 230 10*3/uL (ref 140–450)
RBC: 4.47 M/uL (ref 3.60–5.20)
RDW-SD: 53.8 — ABNORMAL HIGH (ref 36.4–46.3)
WBC: 8.2 10*3/uL (ref 4.0–11.0)

## 2020-06-27 LAB — CK
CK: 171 U/L (ref 26–192)
Total CK: 171 U/L (ref 26–192)

## 2020-06-27 LAB — BASIC METABOLIC PANEL
Anion Gap: 4 mmol/L — ABNORMAL LOW (ref 5–15)
BUN: 8 mg/dl (ref 7–25)
CO2: 26 mEq/L (ref 21–32)
Calcium: 9.3 mg/dl (ref 8.5–10.1)
Chloride: 110 mEq/L — ABNORMAL HIGH (ref 98–107)
Creatinine: 0.8 mg/dl (ref 0.6–1.3)
EGFR IF NonAfrican American: >60
GFR African American: >60
Glucose: 102 mg/dl (ref 74–106)
Potassium: 4.5 mEq/L (ref 3.5–5.1)
Sodium: 140 mEq/L (ref 136–145)

## 2020-06-27 LAB — CBC WITH AUTO DIFFERENTIAL
Basophils %: 0.7 % (ref 0–3)
Eosinophils %: 3.4 % (ref 0–5)
Hematocrit: 35.4 % — ABNORMAL LOW (ref 37.0–50.0)
Hemoglobin: 10.2 gm/dl — ABNORMAL LOW (ref 13.0–17.2)
Immature Granulocytes: 0.4 % (ref 0.0–3.0)
Lymphocytes %: 23.3 % — ABNORMAL LOW (ref 28–48)
MCH: 22.8 pg — ABNORMAL LOW (ref 25.4–34.6)
MCHC: 28.8 gm/dl — ABNORMAL LOW (ref 30.0–36.0)
MCV: 79.2 fL — ABNORMAL LOW (ref 80.0–98.0)
MPV: 11 fL — ABNORMAL HIGH (ref 6.0–10.0)
Monocytes %: 7.1 % (ref 1–13)
Neutrophils %: 65.1 % — ABNORMAL HIGH (ref 34–64)
Nucleated RBCs: 0 (ref 0–0)
Platelet Comment: NORMAL
Platelets: 230 10*3/uL (ref 140–450)
RBC: 4.47 M/uL (ref 3.60–5.20)
RDW-SD: 53.8 — ABNORMAL HIGH (ref 36.4–46.3)
WBC: 8.2 10*3/uL (ref 4.0–11.0)

## 2020-06-27 MED ORDER — IOPAMIDOL 61 % IV SOLN
300 mg iodine /mL (61 %) | Freq: Once | INTRAVENOUS | Status: DC
Start: 2020-06-27 — End: 2020-06-27

## 2020-06-27 MED FILL — MORPHINE 4 MG/ML SYRINGE: 4 mg/mL | INTRAMUSCULAR | Qty: 1

## 2020-06-27 MED FILL — ONDANSETRON (PF) 4 MG/2 ML INJECTION: 4 mg/2 mL | INTRAMUSCULAR | Qty: 2

## 2020-06-27 MED FILL — ISOVUE-300  61 % INTRAVENOUS SOLUTION: 300 mg iodine /mL (61 %) | INTRAVENOUS | Qty: 80

## 2020-06-27 NOTE — ED Notes (Signed)
Reviewed pt discharge instructions with Pt who verbalized understanding. Pt Assisted to wheelchair and taken to meet family in ER lobby. Assisted pt into car. No further needs at this time.

## 2020-06-27 NOTE — ED Notes (Signed)
Placed ACE wrap to  Pt L knee and assisted her in ambulating in room with walker. Pt tolerated ambulating with walker in room. MD made aware.

## 2020-07-02 ENCOUNTER — Emergency Department: Admit: 2020-07-02 | Payer: PRIVATE HEALTH INSURANCE | Primary: Internal Medicine

## 2020-07-02 ENCOUNTER — Inpatient Hospital Stay
Admit: 2020-07-02 | Discharge: 2020-07-02 | Disposition: A | Discharge status: Home / Self Care | Payer: PRIVATE HEALTH INSURANCE | Attending: Student in an Organized Health Care Education/Training Program

## 2020-07-02 DIAGNOSIS — S0990XA Unspecified injury of head, initial encounter: Secondary | ICD-10-CM

## 2020-07-02 MED ORDER — HYDROCODONE-ACETAMINOPHEN 5 MG-325 MG TAB
5-325 mg | ORAL | Status: AC
Start: 2020-07-02 — End: 2020-07-02
  Administered 2020-07-02: 20:00:00 via ORAL

## 2020-07-02 MED FILL — HYDROCODONE-ACETAMINOPHEN 5 MG-325 MG TAB: 5-325 mg | ORAL | Qty: 1

## 2020-07-02 NOTE — ED Notes (Signed)
Went over discharge instructions with patient.  Patient given opportunity to ask questions.  Patient verbalized understanding with all instructions.

## 2020-07-02 NOTE — ED Provider Notes (Signed)
ED  Provider Notes by Merian Capron, PA at 07/02/20 1551                Author: Merian Capron, PA  Service: Emergency Medicine  Author Type: Physician Assistant       Filed: 07/02/20 1909  Date of Service: 07/02/20 1551  Status: Attested           Editor: Merian Capron, PA (Physician Assistant)  Cosigner: Johny Shock, DO at 07/02/20 1916          Attestation signed by Johny Shock, DO at 07/02/20 1916          Supervising Physician Attestation      I, Johny Shock, DO , have personally seen and examined this patient; I have fully participated in the care of this patient with the advanced practice provider.  This is a 69 y.o. female with history and exam consistent with closed head injury and  right-sided facial contusion after a ground-level fall.  On my evaluation the patient was noted to have mild tenderness over the right stigmatic arch without evidence of significant ecchymosis or deformity.  Patient noted to have no evidence of extraocular  muscle deficit.  Patient noted to have no evidence of other neurologic deficits on my evaluation.  Patient noted to have CT scan of the head with no evidence of acute intracranial hemorrhage and no evidence of orbital wall fracture on CT scans of the  face.  We discussed return precautions for evidence of delayed intracranial hemorrhage given the patient's history of anticoagulation with patient demonstrating understanding and agreement with these return precautions prior to discharge.      I have reviewed and agree with all pertinent clinical information including history, physical exam, studies and the plan.  I have also reviewed and agree with the medications, allergies and past medical history sections for this patient.  I reviewed the  return precautions and discharge and follow up plan, and discussed them with the patient prior to discharge from the ED.      Johny Shock, DO   July 02, 2020                                    [facility]   Emergency Department Treatment Report          Patient: [name] [name]  Age: 69 y.o.  Sex: female          Date of Birth: November 10, 1950  Admit Date: 07/02/2020  PCP: Otto Herb, MD     MRN: [id number]   CSN: [id number]   ATTENDING: Johny Shock, DO         Room: [id number]  Time Dictated: 3:51 PM  APP: Merian Capron, PA-C        Chief Complaint      Chief Complaint       Patient presents with        ?  Head Injury     ?  Shoulder Pain        ?  Fall             History of Present Illness     69 y.o. female  with a history of HTN, HLD, CAD, DM, chronic pain, seizure disorder, PE on Eliquis presenting to the ED for evaluation of a fall with head injury. Patient reports that she ambulates with a walker at home, was getting  out of bed this morning and lost  her footing, fell and hit the right side of her face/head on her walker on the way down. Did not lose consciousness. She was able to get up off the floor and has been ambulatory without difficulty, she noted that the right side of her face began to swell  and became more painful, pain now rated 10 out of 10. Denies any vision change. Denies other injury associated with the fall. She is anticoagulated on Eliquis.         Review of Systems     Constitutional:  No fever, chills, body aches   ENT: +right sided facial pain.  No sore throat or runny nose, no vision change.   Respiratory: No cough, shortness of breath, or wheezing   Cardiovascular: No chest pain or palpitations   Gastrointestinal: No abdominal pain, nausea, vomiting, diarrhea, or constipation   Genitourinary: No dysuria or frequency.   Musculoskeletal: No swelling or joint pain   Integumentary: No rashes or lesions   Hematologic: No easy bruising/bleeding.   Neurological: +head injury, headache.  No dizziness.        Past Medical/Surgical History          Past Medical History:        Diagnosis  Date         ?  Acute hypercapnic respiratory failure (HCC)  02/05/2013          W/ Encephalopathy  requiring BiPAP, Etiology Uncertain however Possible Narcotic Benzodiazipine Related?          ?  Adjustment disorder with mixed anxiety and depressed mood       ?  Aneurysm of internal carotid artery       ?  Arthritis of knee       ?  Asthma       ?  Autoimmune disease (HCC)       ?  CAD (coronary artery disease)       ?  Cervicalgia       ?  Chest pain  03/17/2014          Dobutamine Stress Echo: NORMAL WALL MOTION AND GLOBAL SYSTOLIC FUNCTION AT REST AND AFTER?? STRESS/EXERCISE WITH APPROPRIATE AUGMENTATION OF FUNCTION  IN ALL SEGMENTS. NO EVIDENCE OF INDUCIBLE ISCHEMIA AT ADEQUATE HEART RATE WITH DOBUTAMINE STRESS.??                  ?  Chronic low back pain  10/22/2014          [facility] MRI Lumbar w/o contrast: Moderate facet arthropathy at L4-L5 and L5-S1. Mild/moderate foraminal stenosis at these levels. Disc bulge with annular  tear at L4-L5.          ?  Chronic pain syndrome       ?  Coccydynia       ?  Diabetes mellitus            NIDDM         ?  DVT (deep venous thrombosis) (HCC)  05/19/2007          [facility] CTA: 1. Small nonocclusive pulmonary embolus in a subsegmental branch to the left lower lobe. Larger filling defect in a segmental branch to  the right lower lobe, not as low in attenuation as is normally seen with a pulmonary embolus, however is seen in multiple planes and remains concerning for an additional nonocclusive pulmonary embolus.         ?  Fibromyalgia       ?  Gastric ulcer       ?  Gastritis  01/03/2015          [facility] Admission, EGD + H. Pylori/Gastritis         ?  Generalized osteoarthritis of multiple sites       ?  GI bleed  01/01/2015          [facility] Admit 5/2-01/07/2015         ?  H. pylori infection  01/03/2015          [facility] Admission, EGD + H. Pylori/Gastritis         ?  Hemiparesis, right (HCC)       ?  Hyperlipidemia       ?  Hypertension       ?  Irregular sleep-wake rhythm       ?  Knee joint pain           ?  Lumbar spondylosis            Orthopedic Spine Surgeon: Dr. Ree Kida L. Henrene Hawking           ?  Muscle spasm       ?  Normocytic anemia            Chronic Iron Deficiency Anemia         ?  Osteoporosis       ?  Pancreatitis       ?  Peripheral neuropathy       ?  Pulmonary embolism (HCC)            Multiple         ?  Seizures (HCC)       ?  Sensory ataxia       ?  SLE (systemic lupus erythematosus) (HCC)       ?  Stroke [facility])       ?  Subclinical hypothyroidism       ?  Vertigo           ?  Vitamin D deficiency            Past Surgical History:         Procedure  Laterality  Date          ?  HX CHOLECYSTECTOMY         ?  HX GI              Partial Gastrectomy           ?  HX GI    01/03/2015          [facility] EGD by Dr. Smitty Cords D. Waldholtz: S/P B-2 Gastric Resection, Gastritis in small remnant, Bx of Jejunum r/o Celiac Disease and of Stomach. + Gastritis  + H. Pylori          ?  HX HYSTERECTOMY         ?  PR CARDIAC SURG PROCEDURE UNLIST              Cardiac Cath PCI w/ Stents             Social History          Social History          Socioeconomic History         ?  Marital status:  DIVORCED              Spouse name:  Not on file         ?  Number of children:  Not on file     ?  Years of education:  Not on file     ?  Highest education level:  Not on file       Tobacco Use         ?  Smoking status:  Former Smoker     ?  Smokeless tobacco:  Never Used       Vaping Use         ?  Vaping Use:  Never used       Substance and Sexual Activity         ?  Alcohol use:  No     ?  Drug use:  No     ?  Sexual activity:  Not Currently              Partners:  Male          Social Determinants of Health          Financial Resource Strain:         ?  Difficulty of Paying Living Expenses:        Food Insecurity:         ?  Worried About Programme researcher, broadcasting/film/video in the Last Year:      ?  Barista in the Last Year:        Transportation Needs:         ?  Freight forwarder (Medical):      ?  Lack of Transportation (Non-Medical):        Physical Activity:         ?  Days of Exercise per Week:      ?  Minutes of  Exercise per Session:        Stress:         ?  Feeling of Stress :        Social Connections:         ?  Frequency of Communication with Friends and Family:      ?  Frequency of Social Gatherings with Friends and Family:      ?  Attends Religious Services:      ?  Active Member of Clubs or Organizations:      ?  Attends Banker Meetings:         ?  Marital Status:              Family History          Family History         Problem  Relation  Age of Onset          ?  Diabetes  Maternal Grandmother       ?  Heart Disease  Maternal Grandmother            ?  Heart Disease  Mother               Current Medications          Prior to Admission Medications     Prescriptions  Last Dose  Informant  Patient Reported?  Taking?      Cholecalciferol, Vitamin D3, (VITAMIN D) 1,000 unit Cap      Yes  No      Sig: Take 50,000 Units by mouth every Sunday.      apixaban (ELIQUIS) 5 mg (74 tabs) starter pack      No  No      Sig: Take 10 mg (two 5 mg tablets) by mouth twice a day for 7 days    Followed by 5 mg (one 5 mg tablet) by mouth twice a day      ascorbic acid, vitamin C, (VITAMIN C) 250 mg tablet      No  No      Sig: Take 1 Tab by mouth two (2) times a day. Take with iron pill      calcium carbonate (CALTREX) 600 mg calcium (1,500 mg) tablet      Yes  No      Sig: Take 600 mg by mouth daily.      cyclobenzaprine (FLEXERIL) 10 mg tablet      Yes  No      Sig: Take 10 mg by mouth three (3) times daily as needed for Muscle Spasm(s).      gabapentin (NEURONTIN) 100 mg capsule      No  No      Sig: Take 1 Cap by mouth two (2) times a day. Max Daily Amount: 200 mg.      lamoTRIgine (LAMICTAL) 100 mg tablet      No  No      Sig: Take 1 Tab by mouth two (2) times a day.      naloxone (NARCAN) 0.4 mg/mL injection      No  No      Sig: 0.25 mL by IntraVENous route as needed for Other (Give if breaths per minute is less than or equal to 8, may repeat dose in 15 minutes and contact  MD.).      omeprazole (PRILOSEC) 20 mg  capsule      Yes  No      Sig: Take 20 mg by mouth daily.      ondansetron hcl (ZOFRAN) 4 mg tablet      No  No      Sig: Take 1 Tab by mouth every eight (8) hours as needed for Nausea.      simvastatin (ZOCOR) 80 mg tablet      Yes  No      Sig: Take 80 mg by mouth nightly.      sucralfate (CARAFATE) 100 mg/mL suspension      Yes  No      Sig: Take 1 tsp by mouth two (2) times a day.               Facility-Administered Medications: None             Allergies          Allergies        Allergen  Reactions         ?  Tape [Adhesive]  Itching             Paper tape             ?  Aspirin  Not Reported This Time             Because of hx ulcers per patient         ?  Opana [Oxymorphone]  Rash and Itching         ?  Tylenol [Acetaminophen]  Itching             Physical Exam          ED Triage Vitals     ED Encounter Vitals Group  BP  07/02/20 1230  (!) 164/102        Pulse (Heart Rate)  07/02/20 1230  76        Resp Rate  07/02/20 1230  16        Temp  07/02/20 1300  98.2 ??F (36.8 ??C)        Temp src  --          O2 Sat (%)  07/02/20 1230  98 %        Weight  --              Height  --          Constitutional: Non toxic appearing elderly female seated upright in wheelchair, alert, answering questions appropriately.    HEENT: Conjunctiva clear.  PERRL, EOMI. Mucous membranes moist, non-erythematous. Surface of the pharynx, palate, and tongue are pink, moist  and without lesions. Tenderness lateral aspect of the right eyebrow and right zygomatic arch, minimal swelling noted.    Neck: supple, non tender, symmetrical, no masses or lymphadenopathy, full painless ROM.   Respiratory: lungs clear to auscultation, nonlabored respirations. No tachypnea or accessory muscle use.   Cardiovascular: heart regular rate and rhythm without murmur rubs or gallops.      Gastrointestinal:  Soft, non-tender, non-distended, normoactive bowel sounds   Musculoskeletal: Calves soft and non-tender. No peripheral edema or significant  varicosities.   Pulses:  radial and DP pulses 2+ and equal bilaterally.    Integumentary: warm and dry, no jaundice, no rashes or lesions   Neurologic: Alert and oriented x 4. No facial droop or dysarthria. Sensation to light touch is preserved and symmetric over the face and all  extremities. Grip strength, plantar flexion and dorsiflexion strength 5/5 and symmetric throughout.         Impression and Management Plan     69 year old female anticoagulated on Eliquis presenting to the ED for evaluation of head injury now with right facial pain. On exam, vitals are grossly normal, she has some soft tissue  swelling about the right eye as described with associated tenderness, no evidence of ocular entrapment, no focal neurologic deficits. Will obtain CT head, maxillofacial, treat symptomatically.      DDx: Including but not limited to fracture versus contusion, ICH versus concussion     Diagnostic Studies     Lab:    No results found for this or any previous visit (from the past 12 hour(s)).   Imaging:     CT HEAD WO CONT      Result Date: 07/02/2020   Examination: CT brain noncontrast Clinical indication: Trauma, possible right orbital injury Comparison: 06/26/2020;  Findings: Multiple contiguous axial CT images of the brain were obtained from the vertex of the skull to the skull base with no contrast.  All CT exams at this facility use one or more dose reduction techniques including automatic exposure control, ma/kV  adjustment per patient's size, or iterative reconstruction technique. Paranasal sinuses well aerated.  Mastoids, normal. No hemorrhage,  masses, or midline shift. No extra-axial fluid collections. No hydrocephalus.  Periventricular areas of low attenuation, especially about the occipital horns.  Moderate frontotemporal atrophy. Subinsular low densities. Osseous structures intact. Skullbase  and orbits grossly intact. Craniocervical junction is intact.       Impression: Chronic microvascular ischemic  changes. No acute change.       CT MAXILLOFACIAL WO CONT      Result Date: 07/02/2020   EXAMINATION: CT  MAXILLOFACIAL WO CONT CLINICAL INDICATION: fall, right ?orbital injury , right orbital pain COMPARISON:  None. TECHNIQUE: All CT exams at this facility use one or more dose reduction techniques including automatic exposure control, ma/kV   adjustment per patient's size, or iterative reconstruction technique. Multiple contiguous axial CT images of the brain were obtained facial bones without contrast including orbits. FINDINGS: Extraocular muscles appear intact. Orbital floor and orbital  rim appear intact. No lamina papyracea fracture. Globes appear intact. Paranasal sinuses well aerated. Sinuses well aerated. Moderate arthrosis TMJ bilaterally.       IMPRESSION:  1. Orbits intact. No acute fracture. Globes appear intact. 2. Moderate TMJ arthrosis bilaterally            ED Course/Medical Decision Making     CT scans are unremarkable without evidence of ICH or orbital or other facial fracture. As above she is alert, answering questions appropriately without any neurologic deficit; I believe  she can be safely discharged home. Advised OTC analgesics as needed for pain, return with new or worsening symptoms, close PCP outpatient follow up. Patient verbalized understanding, remained stable during her time in the ED and was discharged home in  good condition.          Final Diagnosis                 ICD-10-CM  ICD-9-CM          1.  Injury of head, initial encounter   S09.90XA  959.01          2.  Right sided facial pain   R51.9  784.0             Disposition     Discharged home.       Merian Capron, PA-C   July 02, 2020      The patient was personally evaluated by myself and [facility], KENNETH, DO who agrees with the above assessment and plan.      My signature above authenticates this document and my orders, the final diagnosis (es), discharge prescription (s), and instructions in the Epic record. If you have any  questions please contact [phone].       Nursing notes have been reviewed by the physician/ advanced practice clinician.

## 2020-07-02 NOTE — ED Notes (Signed)
Patient presents to ED by EMS from home for evaluation of R sided facial pain/headache, R shoulder, and R hip pain s/p fall this morning. Pt reportedly hit the right side of her body on her bed. No LOC.

## 2020-09-14 ENCOUNTER — Encounter: Admit: 2020-09-14 | Discharge: 2020-09-14 | Payer: MEDICAID | Primary: Internal Medicine

## 2020-09-14 NOTE — Home Health (Signed)
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 Pt reports that she does not check her BS and she has not taking any meds for DM in quite some time. Pt repotrs that he needs to get alot of her medications refilled. Pt reported that she had a CVA in 2009 that had affected her R side   LIVING SITUATION: Pt lives with daughter in a first floor townhome with 2 1 steps to enter without railings. Pt has 24 hr private duty aide assistance   REQUIRES CAREGIVER ASSISTANCE FOR: transportation, medicatons, ADLS, IADLS   PLOF: Pt was amb with rollator walker. Pt needed A with dressing and bathing using a shower chair   MEDICATIONS REVIEWED AND RECONCILED  Medications reconciled and all medications   The following education was provided regarding medications, medication interactions, and look a like medications.  Medications are effective at this time.  no severe interactions noted. Pt did not have  the following meds in the home. eliquis lamotrigine, vitamin D3 Vitamin C , caltrex , I called Dr Marrian Salvage office and spoke with Malachy Moan and notified her of the missing medicatons. She stated she would let MD know . Spoke with LPTA on 09/18/20 visit pt still did not have her medications  NEXT MD APPT: Dr. Marrian Salvage  ROM:  B LE WFL   STRENGTH:  L hip flexors -4/4 L quads and hamstrings 4/5  L DF/PF 5/5   R hip flexors 3/5 R quads and hamstrings +3/5 R DF/PF 4/5   WOUNDS:none   BED MOBILITY:supine to sit and sit to supine MOD I   TRANSFERS:sit to stand from rollator walker with SBA with B UE support for push off with MIN vc needed for safety to lock breaks of rolaltor prior to sitting. sit to stand from low couch with CGA with B UE support of push off. Increased time needed to complete taak. Sit to stand from bed and commode with B UE support for push off with SBA increased BOS noted with transfers   GAIT: Pt amb 15 ft x 2 with rollator walker with fwd flexed posture with  decrased B step length B feet do not clear the floor during swing phase of gait. Decreased B knee flexion noted during swing phase of gait. Pt has a wide BOS when amb. Pt amb 10 ft with SPC in bathroom with decreased B step length slow cadence wide BOS with shuffling gait pattern with SBA   STAIRS: NA   BALANCE: Pt scored 11/28 on [place] Balance Assessment placing pt at high  risk for falls.  PATIENT RESPONE TO TX: Pt had no increase in pain throughout tx session. Pt reported fatigue after tx sesson O2 sat 94% with HR of 93  PATIENT LEVEL OF UNDERSTANDING OF EDUCATION PROVIDED Pt demonstated and verblized understanding of uisng Rollator walker at all times when amb for safety   PATIENT EDUCATION PROVIDED THIS VISIT: safety, HEP, walking, deep breathing, Educated pt to change positon every HR for pressure relief. Educated pt to use Rollator walker at all times when amb for safety. Adjusted height to walker. Educated pt to use [facility] when amb in bathroom as her  rollator does not fit through the doorway. Educated pt to keep the pathways that she amb in free and clear of clutter for safety   HEP consisting of:  1. Walking every hour during the day with Rollator walker   2.   B LE seated hip flexion, LAQS,AP 3 daily x 10   Written HEP issued, patient/caregiver verbalized understanding.   CONTINUED NEED FOR THE FOLLOWING SKILLS: HH PT is medically necessary to address pain, decreased, decreased strength,  decreased independence with functional transfers, impaired gait, impaired stair negotiation, and impaired balance in order to improve functional independence, quality of life, return to PLOF, and reduce the risk for falls.  ASSESSMENT: Pt presents with decreased stength B LE. A required with all transfers and stairs. Pt is amb limited distances with rollator walker with A. Pt presents with decreased dynamic standing balance. OT has been orderd to assess ADLS and IADLS   PLAN: Pt will be seen to establish and upgrade HEP. Gait training with  the least restrictive A DEV on flat and uneven surfaces.Bed mobility training, transfer training. Stair training. Dynamic standing balance re -education. Reinforece general safety precautions.  DISCHARGE PLANNING DISCUSSED: Discharge to self and family under MD supervision once all goals have been met or patient has reached max potential. Patient/caregiver verbalized understanding.      Dr.  Marrian Salvage office notified that PT Admit completed 09/14/20. Medications reconciled and all medications   The following education was provided regarding medications, medication interactions, and look a like medications.  Medications are effective at this time.  no severe interactions noted. Pt did not have the following meds in the home. eliquis lamotrigine, vitamin D3 Vitamin C , caltrex , I called Dr Marrian Salvage office and spoke with Malachy Moan and notified her of the missing medicatons. She stated she would let MD know . Pt presents with decreased stength B LE. A  required with all transfers and stairs. Pt is amb limited distances with rollator walker with A. Pt presents with decreased dynamic standing balance. OT has been orderd to assess ADLS and IADLS. Pt will be seen to establish and upgrade HEP. Gait training with  the least restrictive A DEV on flat and uneven surfaces.Bed mobility training, transfer training. Stair training. Dynamic standing balance re -education. Reinforece general safety precautions.

## 2020-09-17 ENCOUNTER — Encounter: Primary: Internal Medicine

## 2020-09-18 ENCOUNTER — Encounter: Admit: 2020-09-18 | Discharge: 2020-09-18 | Payer: MEDICAID | Primary: Internal Medicine

## 2020-09-18 NOTE — Home Health (Signed)
 SUBJECTIVE:I feel awful. Last night I could barely breath and then I have nausea and alot of diarrea. I have been drinking sips of water. I woke up this morning and I was so cold. I am a retired Engineer, civil (consulting). I know what you all go through every day.     CAREGIVER INVOLEMENT:Pt lives with daughter in a first floor townhome with 2 1 steps to enter without railings. Pt has 24 hr private duty aide assistance     REQUIRES CAREGIVER ASSISTANCE FOR: transportation, medicatons, ADLS, IADLS     NEXT PAIN MANAMGENT APPOINTMENT October 22, 2020    HEP consisting of:    1. Walking every hour during the day with Rollator walker   2.   B LE seated hip flexion, LAQS,AP 3 daily x 10       OBJECTIVE: see interventions for further details    GAIT: Pt amb with rollator support with step through gait pattern, full foot clearance, decreased step length and slow cadence, S level.    TRANSFERS: Sit to stand from couch using BUE's to push off S level. Pt demonstrates G initial standing balance and reaches for walker once up.     THER EX: Pt instructed in sitting ther ex program AROM with verbal cueing for quality of reptition. THER EX to include DFPF, HIP FLEXION, LAQ, ABAD 1X10. Pt able to complete all ther ex with good tolerance to actiity and with no pain complaints SA02 on RA 99%, HR 81 BPM    PATIENT REPONSE TO TREATMENT:Pt was unfortunately not feeling well at all upon arrival to home today. Pt did not inform me of this until I was at the home and starting my treatment. Despite not feeling well, pt participated in sitting ther ex program.    PT LEVEL OF UNDERSTANDING OF EDUCATION PROVIDED; Pt appears cognitive and verbalizes understanding of instruction provided    ASSESSMENT OF PROGRESS TOWARDS GOALS: Pt made good progress with PT services today by implementation of sitting ther ex program, with return demonstration.Upon arrival to home today, pt reports she is no feeling well with body aches, and nausea. . Pt did not inform me of this  until I was at the home and starting my treatment. Pt had an appt for OT evaluation today and requests to cancel it.  Despite not feeling well, pt participated in sitting ther ex program. Pt instructed to push fluids and monitor sx, and call PCP if needed.      CONTINUED NEED FOR FOR FOLLOWING SKILLS Continue PT services 1x1, 2x3 7 visits to address pain, decreased, decreased strength,  decreased independence with functional transfers, impaired gait, impaired stair negotiation, and impaired balance in order to improve functional independence, quality of life, return to PLOF, and reduce the risk for falls     PLAN FOR NEXT VISIT: ADD NEW MEDICATIONS, IMPLEMENT STANDING THER EX PROGRAM

## 2020-09-20 ENCOUNTER — Encounter: Primary: Internal Medicine

## 2020-09-22 ENCOUNTER — Encounter: Primary: Internal Medicine

## 2020-09-24 ENCOUNTER — Encounter: Admit: 2020-09-24 | Discharge: 2020-09-24 | Payer: MEDICAID | Primary: Internal Medicine

## 2020-09-25 ENCOUNTER — Encounter: Admit: 2020-09-25 | Discharge: 2020-09-25 | Payer: MEDICAID | Primary: Internal Medicine

## 2020-09-25 NOTE — Home Health (Signed)
 SUBJECTIVE:I need to find another place to live. My daughter has moved to [place]  and I have to be out of here by the end of February.        CAREGIVER INVOLEMENT:Pt lives with daughter in a first floor townhome with 2 1 steps to enter without railings. Pt has 24 hr private duty aide assistance          REQUIRES CAREGIVER ASSISTANCE FOR: transportation, showering as needed, some ADLS via caregiver         NEXT PAIN MANAMGENT APPOINTMENT October 22, 2020         HEP consisting of:         1. Walking every hour during the day with Rollator walker     2.   B LE seated hip flexion, LAQS,AP 3 daily x 10   ADDED STANDING ABAD, DFPF, SHORT SQUATS (1x5), HIP FLEXION AND HS CURLS 2X10 EVERY OTHER DAY              OBJECTIVE: see interventions for further details         GAIT: Pt amb with rollator support with step through gait pattern, full foot clearance, decreased step length and slow cadence, MOD I level. Pt amb with no AD in household with decreased cadence and step length, pt holding onto wall with 1 hand S level. Pt reports she prefers using the wall vs using a cane.          TRANSFERS: Sit to stand from couch, bed, and commode using BUE's to push off MOD I level. Pt demonstrates G initial standing balance and reaches for walker once up. Pt reports her aide helps her dress and bathe         THER EX: Review of HEP. Pt is compliant with HEP per pt report. Pt educated on the importance and purpose of performing HEP.THER EX implemented to increase strength and further increase functional mobility and safety with transfers and gait.Pt instructed in higher level standng ther ex AROM with 2 UE support at kitchen counter with verbal cueing for quality of repitition. THER EX to include ABAD, DFPF, SHORT SQUATS (1x5), HIP FLEXION AND HS CURLS 2X10. Pt able to complete all ther ex with good tolerance to activity and no complaints of pain. Mild SOB with ther ex, but SA02 on RA 98% and HR 82 right after ther ex             PATIENT REPONSE TO TREATMENT: Pt was cooperative and pleasant during treatment and receptive to all teaching provided         PT LEVEL OF UNDERSTANDING OF EDUCATION PROVIDED; Pt appears cognitive and verbalizes understanding of instruction provided         ASSESSMENT OF PROGRESS TOWARDS GOALS: Pt made good progress with PT services today by implementation of standing ther ex program with return demonstration with good tolerance. Pt is also MOD I with gait in home with rollator support and is performing all transfers in home I to MOD I. I recommended to pt to purchase a [facility] for future sessions to progres gait further. Pt sometimes ambulates with no AD in home, reaching for walls and furniture. Pt reports her daughter has moved to [facility] and she has to be out of her town home by the end of February, and does not know what she will do.  Informed pt that she will need to contact her Medicaid SW assigned to her case .  CONTINUED NEED FOR FOR FOLLOWING SKILLS Continue PT services 1x1, 2x3 7 visits to address pain, decreased, decreased strength,  decreased independence with functional transfers, impaired gait, impaired stair negotiation, and impaired balance in order to improve functional independence, quality of life, return to PLOF, and reduce the risk for falls          PLAN FOR NEXT VISIT: GAIT TRAINING WITH CANE

## 2020-09-26 ENCOUNTER — Encounter: Primary: Internal Medicine

## 2020-09-27 ENCOUNTER — Encounter: Admit: 2020-09-27 | Discharge: 2020-09-27 | Payer: MEDICAID | Primary: Internal Medicine

## 2020-09-27 NOTE — Home Health (Signed)
 SUBJECTIVE: I am tired because of the exercises I have been doing. I am going to move down to [place] with my daughter. My caregiver is grocery shopping for me right now.  Sometimes I go with her.                 CAREGIVER INVOLEMENT:Pt lives with daughter in a first floor townhome with 2 1 steps to enter without railings. Pt has 24 hr private duty aide assistance                    REQUIRES CAREGIVER ASSISTANCE FOR: transportation, showering as needed, some ADLS via caregiver                   NEXT PAIN MANAMGENT APPOINTMENT October 22, 2020, PCP 10/24/2020 11:00 AM                   HEP consisting of:                   1. Walking every hour during the day with Rollator walker          2.   B LE seated hip flexion, LAQS,AP 3 daily x 10      STANDING ABAD, DFPF, SHORT SQUATS (1x5), HIP FLEXION AND HS CURLS 2X10 EVERY OTHER DAY                             OBJECTIVE: see interventions for further details                   GAIT: Pt amb with rollator support with step through gait pattern, full foot clearance, decreased step length and slow cadence, MOD I level.GAIT TRAINING WITH CANE WITH CANE IN R HAND with step through gait pattern, full full clearance, WNL BOS, slightly decreased cadence and step length S level. Pt instructed to use cane to tolerance and when back pain is present, use walker as needed . Pt expresses understanding.                   TRANSFERS: . Pt is indpependant with all transfers within the home. Pt reports she goes to store sometimes with caregiver and has no difficulty getting into a car. Pt declines car transfer with my car today                   THER EX: Review of HEP. Pt performed siiting ther ex  AROM to include DFPF, HIP FLEXION, LAQ, ABAD 2X10. Pt able to complete all ther ex in pain free range. SA02 on RA 98%, HR 78 BPM. Pt reports she is alternating standng and sitting ther ex daily                      PATIENT REPONSE TO TREATMENT: Pt was cooperative and pleasant during treatment  and able to provide teach back on previous education provided                   PT LEVEL OF UNDERSTANDING OF EDUCATION PROVIDED; Pt appears cognitive and verbalizes understanding of instruction provided                   ASSESSMENT OF PROGRESS TOWARDS GOALS: Pt made good progress with PT services today by performance of gait with cane support with good tolerance, and also compliance with HEP. Pt reports she  is doing sitting ther ex one day, and standing ther ex on alternating days. Pt instructed to use cane to tolerance and revert back to walker if her back is causing her discomfort.  Pt reports she has spoken to her daughter, and her daughter will come and get her and bring her to [place]  at the end of her lease.  Pt is still missing muliple medications. I called PCP office today and spoke to nurse to inform of pts concerns. Nurse reports she will inform MD and MD will send in scripts as needed to pharmacy.              CONTINUED NEED FOR FOR FOLLOWING SKILLS Continue PT services 1x1, 2x3 7 visits to address pain, decreased, decreased strength,  , impaired gait, impaired stair negotiation, and impaired balance in order to improve functional independence, quality of life, return to PLOF, and reduce the risk for falls                    PLAN FOR NEXT VISIT:STEPS, GAIT OUTSIDE WEATHER PERMITTING    EMERGENCY EVACUATION INFORMATION  REVIEWED emergency plan with patient/caregiver  Patient will be staying at home Y  EVACUATE WITHOUT EMERGENCY TRANSPORTATION Y  EVACUATE WITH EMERGENCY HELP RICK BY CALLING [phone]

## 2020-09-28 ENCOUNTER — Encounter: Admit: 2020-09-28 | Discharge: 2020-09-28 | Payer: MEDICAID | Primary: Internal Medicine

## 2020-09-28 NOTE — Home Health (Signed)
SUBJECTIVE: Pt answered door with no AD upon arrival, reported she had just woken up. Pt had obtained raised toilet seat, suction cup grab bars and hip kit since evaulation and needed assistance with placement and use.  MEDICATIONS REVIEWED AND UPDATED: no medication changes reported this visit.  .  OBJECTIVE: see interventions  PATIENT RESPONSE TO TREATMENT:  Pt demonstrated a positive response to treatment with reduced pain from use of LHAD as educated and good tolerance of all activity to practice with new AD.  Marland Kitchen  PATIENT/CAREGIVER EDUCATION PROVIDED THIS VISIT: Therapist provided education on use of LHAD, set-up and use of grab bars and placement of raised toilet seat to increase pt safety and reduce pain in back when performing ADLs.   PATIENT LEVEL OF UNDERSTANDING OF EDUCATION PROVIDED: Pt demonstrated good understanding of education through teachback of each device proper use and with use of raised seat and grab bars in bathroom to perform transfers more I.  ASSESSMENT AND PROGRESS TOWARD GOALS:  Pt demonstrated good progress towards goals for increased safety and I with self-care and functional mobility with reduced back pain.  Patient will benefit from continued intervention with progression of I/ADL, transfer, strength and safety training.  CONTINUED NEED FOR THE FOLLOWING SKILLS: HH OT is medically necessary to address pain, decreased strength, impaired bed mobility, decreased independence with functional transfers, decreased I with I/ADLs, and impaired balance in order to improve functional independence, quality of life, return to PLOF, reduce the risk for falls, and reduce pain.  PLAN: next visit will be for HEP education, continued balance and transfer training to prepare for I/ADL performance.    DISCHARGE PLANNING DISCUSSED: Discharge to self and family under MD supervision once all goals have been met or patient has reached maximum potential.  Frequency remaining: 2w1, 1w1 remaining.    Marland Kitchen  .Emergency Preparedness: Patient/Caregiver Instructed in the following:    Have one gallon of water per person for at least 3 days on hand.  Have non-perishable food for at least 3 days that do not need to be cooked.  Have flashlights and batteries  Charge your cell phones & any back up lithium batteries for your cell phones.  Have 3+ days of back up oxygen in your home if applicable.  Have a phone in your home that is hard wired & does not require power.  Have medication for a week in your home.  Make sure you have a caregiver in the home to provide care in case your home health/hospice nurse cannot get to your house.  Make sure you have all of your paperwork, I.e. id, insurance cards, DME phone number, DR & pharmacy phone numbers, agency phone number, medication in place for easy access & in zip lock bag for protection.  Call agency if you relocate so we can contact you.

## 2020-10-02 ENCOUNTER — Encounter: Admit: 2020-10-02 | Discharge: 2020-10-02 | Payer: MEDICAID | Primary: Internal Medicine

## 2020-10-02 NOTE — Home Health (Signed)
 SUBJECTIVE:I got my narcotic refilled and that is it.  I have been using the cane since  ou left. I don't want to walk outside. Do we have to?   Upon arriving at home, pt reports the pain management MD filled her pain medication but has not heard from PCP office about refills. I had pt call pharmacy during visit to see if any pending meds are waiting for her. Pharmacy confirms that one medication is out for delivery propranolol 40 mg take 1 tablet by mouth 2x daily.             NEXT MD APPT PCP 2/232022              CAREGIVER INVOLEMENT:Pt lives with daughter in a first floor townhome with 2 1 steps to enter without railings. Pt has 24 hr private duty aide assistance                    REQUIRES CAREGIVER ASSISTANCE FOR: transportation, showering as needed, some ADLS via caregiver      NEXT PAIN MANAMGENT APPOINTMENT October 22, 2020, PCP 10/24/2020 11:00 AM       HEP consisting of:    1. Walking every hour during the day with Rollator walker   2.   B LE seated hip flexion, LAQS,AP 3 daily x 10   STANDING ABAD, DFPF, SHORT SQUATS (1x5), HIP FLEXION AND HS CURLS 2X10 EVERY OTHER DAY      OBJECTIVE: see interventions for further details        GAIT: Pt amb with rollator support with step through gait pattern, full foot clearance,  slightly decreased step length and cadence, MOD I level in home and extended street and sidewalk surfaces 250+ feet . Pt amb  WITH CANE IN R HAND with step through gait pattern, full full clearance, WNL BOS, slightly decreased cadence and step length MOD I level in home. Pt reports she is walking in home with cane support and always uses walker outside. Pt able to tolerate extended gait with 1 sitting rest break on walker. SA02 on RA 91%, HR 80 BPM      TRANSFERS: . Pt is indpependant with all transfers in and out of home . SABRACar transfer I with effiency.       THER EX: Review of HEP. Pt reports she is doing her HEP daily    STEPS ; UP/DOWN 2 STEPS LEADING INTO HOME stepping down with LLE  and up with RLE with pt able to lift walker up and down step with no difficulty I .       PATIENT REPONSE TO TREATMENT: Pt required encouragement to walk outside, but enjoyed being outside once she was out there         PT LEVEL OF UNDERSTANDING OF EDUCATION PROVIDED; Pt appears cognitive and verbalizes understanding of instruction provided         ASSESSMENT OF PROGRESS TOWARDS GOALS: Pt made good progress with PT services since [facility] by pt is independant with gait in the home with RW support . Pt is also I with gait in home using a cane on level surfaces. Pt is currently using a cane in the home, and using a walker outside. Pt reports she rarely goes outside , and required encouragement today to perform this task. Pt is independant with all transfers in and out of the home, as well as car transfers. Pt is compliant with her HEP and reports she is doing it daily.  Pt is also able to perform steps to ingress and egress the home, and able to lift the walker up and down step with no difficulty. Pt consistantly practices good energy conserving practicies.   Pt reports her PCP office never called her back, and upon calling the pharmacy today. Upon arriving at home, pt reports the pain management MD filled her pain medication but has not heard from PCP office about refills. I had pt call pharmacy during visit to see if any pending meds are waiting for her. Pharmacy confirms that one medication is out for delivery propranolol 40 mg take 1 tablet by mouth 2x daily. Pharmacy reports that that is that has been called in via MD office. Pt to follow up with MD as scheduled and follow recommendations based upon that visit. Pt does not plan on going to outpatient therapy.                   CONTINUED NEED FOR FOR FOLLOWING SKILLS Continue PT services 1x1, 2x3 7 visits to address final DC         PLAN FOR NEXT  FINAL MMT, FINAL TINETTI BALANCE TEST, FINAL  DC

## 2020-10-02 NOTE — Home Health (Signed)
SUBJECTIVE: Pt coming out of bedroom with [facility] upon arrival, pt reported some soreness in L hip from walking earlier with PT but that she was feeling good and has modified grab bar placement in shower to use for stepping in the back and lowering down in to bottom of tub for baths.  CAREGIVER ASSISTANCE NEEDED FOR: pts PCA present during visit if needed for education with AD.  MEDICATIONS REVIEWED AND UPDATED: no medication changes reported this visit  .  OBJECTIVE: see interventions  PATIENT RESPONSE TO TREATMENT:  Pt demonstrated positive response to treatment with no increased pain during activity and good carry over demo of education on AD and safe mobility.  Marland Kitchen  PATIENT/CAREGIVER EDUCATION PROVIDED THIS VISIT: Therapist educated pt on importance of AD use for all ambulation to reduce fall risk and increase of pain from irregular gait when unsupported.   PATIENT LEVEL OF UNDERSTANDING OF EDUCATION PROVIDED: Pt demonstrated understanding of this education through consistent use of AD during visit and verbalized understanding of use of rollator outside home for increased stability and energy conservation.  ASSESSMENT AND PROGRESS TOWARD GOALS:  Pt demonstrated good progress towards goals and has met her ADL and transfer goals with good carry over of education and proper ise of AD.  Patient will benefit from continued intervention with progression of IADL training and assessment in kitchen at rollator level for increased safety in home environment.  CONTINUED NEED FOR THE FOLLOWING SKILLS: HH OT is medically necessary to address decreased strength, impaired bed mobility, decreased independence with functional transfers, decreased I with IADLs, and impaired balance in order to improve functional independence, quality of life, return to PLOF, reduce the risk for falls, and reduce pain.  PLAN: next visit will be for IADL training with use of rollator for increased safety and energy conservation.    DISCHARGE PLANNING  DISCUSSED: Discharge to self and family under MD supervision once all goals have been met or patient has reached maximum potential.  Frequency remaining: 1w2 remaining with pt aware of OTDC next week.

## 2020-10-04 ENCOUNTER — Encounter: Admit: 2020-10-04 | Discharge: 2020-10-04 | Payer: MEDICAID | Primary: Internal Medicine

## 2020-10-04 NOTE — Home Health (Signed)
SUBJECTIVE: Pt answered door with no AD, reported she was a little fatigued from packing this morning and stated her personal aide is helping her find other living options due to her rent being too expensive. Pt reported she was excited for her birthday this weekend and has been working on her sewing.  CAREGIVER ASSISTANCE NEEDED FOR: pts PCA present during visit if needed.  MEDICATIONS REVIEWED AND UPDATED: no medication changes reported this visit.  .  OBJECTIVE: see interventions  PATIENT RESPONSE TO TREATMENT:  Pt demonstrated good response to therapy with no increased pain reported, increased I with meal prep task and improved stability without AD.  Marland Kitchen  PATIENT/CAREGIVER EDUCATION PROVIDED THIS VISIT: Therapist educated pt on safety with ambulation and energy conservation during all activity and use of AD as recommended.   PATIENT LEVEL OF UNDERSTANDING OF EDUCATION PROVIDED: Pt demonstrated good use of energy conservation techniques during meal prep and demonstrated improved stability with decreased need for AD.  ASSESSMENT AND PROGRESS TOWARD GOALS:  Pt demonstrated I with meal prep, improved stability and activity tolerance to meet her IADL goal this visit and is on track for DC next visit as planned.  PLAN: next visit will be for OTDC as pt on track to meet goals and has progressed well with home care.    DISCHARGE PLANNING DISCUSSED: Discharge to self and family under MD supervision once all goals have been met or patient has reached maximum potential.  Frequency remaining: 1w1 remaining with pt aware of OTDC next week.

## 2020-10-04 NOTE — Home Health (Signed)
SUBJECTIVE:Pt reports that she did not pick up her med from the pharmacy yet   REQUIRES CAREGIVER ASSISTANCE FOR: transportation, medications, ADLS ,IADLS   MEDICATIONS REVIEWED AND RECONCILED one medication is out for delivery propranolol 40 mg take 1 tablet by mouth 2x daily.   NEXT MD APPT: Dr.Dr Sabino Gasser 10/24/20  ROM: B LE WFL  STRENGTH: L hip flexors +4/5 L quads and hamstrings 5/5 Df/PF 5/5  R hip flexors 4/5 R quads and hamstrings 5/5   WOUNDS:none   BED MOBILITY:supine to sit and sit to supine  TRANSFERS:sit to stand from couch and bed with B UE support for push off MOD I. Per PTA visit 10/02/20  Pt is indpependant with all transfers in and out of home . Marland KitchenCar transfer I with effiency  GAIT: Pt amb 35 ft x 2 with home with SPC with step through gait pattern MOD I no balance checks noted slow cadence B feet did clear the floor during swing phase of gait.  Per PTA visit 10/02/20 Pt amb with rollator support with step through gait pattern, full foot clearance, slightly decreased step length and cadence, MOD I level in home and extended street and sidewalk surfaces 250+ feet . Pt amb WITH CANE IN R HAND with step through gait pattern, full full clearance, WNL BOS, slightly decreased cadence and step length MOD I level in home. Pt reports she is walking in home with cane support and always uses walker outside.Pt able to tolerate extended gait with 1 sitting rest break on walker. SA02 on RA 91%, HR 80 BPM  STAIRS:Per PTA visit 10/02/20 UP/DOWN 2 STEPS LEADING INTO HOME stepping down with LLE and up with RLE with pt able to lift walker up and down step with no difficulty I .  BALANCE: Pt scored 21/28 on [employer]etti Balance Assessment placing pt at med risk for falls.   PATIENT RESPONE TO TX: no increase in pain throughout tx session   PATIENT LEVEL OF UNDERSTANDING OF EDUCATION PROVIDED Pt demonstated proper use of SPC in the home and verbalized that use of rollator walker in the community  PATIENT EDUCATION PROVIDED THIS  VISIT: safety, HEP, walking, deep breathing, Cont to use SPC when amb in home and Rollator walker  when amb in the community for safety      HEP consisting of:  1. Walking every hour during the day with Rollator walker   2. B LE seated hip flexion, LAQS,AP 3 daily x 10   STANDING ABAD, DFPF, SHORT SQUATS (1x5), HIP FLEXION AND HS CURLS 2X10 EVERY OTHER DAY  Written HEP issued, patient/caregiver verbalized understanding.   Patient is s/p Chronic pain  and has been treated for, strengthening, gait training, stair training, HEP training, safety training, and balance training. On [place]  09/14/20 strength was  L hip flexors -4/4 L quads and hamstrings 4/5  L DF/PF 5/5   R hip flexors 3/5 R quads and hamstrings +3/5 R DF/PF 4/5. Today at DC strength is  L hip flexors +4/5 L quads and hamstrings 5/5 Df/PF 5/5  R hip flexors 4/5 R quads and hamstrings 5/5 . On SOC bed mobility was supine to sit and sit to supine MOD I. Today at DC bed mobility is supine to sit and sit to supine.  On SOC transfers were sit to stand from rollator walker with SBA with B UE support for push off with MIN vc needed for safety to lock breaks of rolaltor prior to sitting. sit to stand from  low couch with CGA with B UE support of push off. Increased time needed to complete taak. Sit to stand from bed and commode with B UE support for push off with SBA increased BOS noted with transfers  Today at DC transfers are sit to stand from couch and bed with B UE support for push off MOD I. Per PTA visit 10/02/20  Pt is indpependant with all transfers in and out of home . Marland KitchenCar transfer I with effiency.  On SOC gait was Pt amb 15 ft x 2 with rollator walker with fwd flexed posture with decrased B step length B feet do not clear the floor during swing phase of gait. Decreased B knee flexion noted during swing phase of gait. Pt has a wide BOS when amb. Pt amb 10 ft with SPC in bathroom with decreased B step length slow cadence wide BOS with shuffling gait pattern  with SBA .Today at DC gait is Pt amb 35 ft x 2 with home with [facility] with step through gait pattern MOD I no balance checks noted slow cadence B feet did clear the floor during swing phase of gait.  Per PTA visit 10/02/20 Pt amb with rollator support with step through gait pattern, full foot clearance, slightly decreased step length and cadence, MOD I level in home and extended street and sidewalk surfaces 250+ feet . Pt amb WITH CANE IN R HAND with step through gait pattern, full full clearance, WNL BOS, slightly decreased cadence and step length MOD I level in home. Pt reports she is walking in home with cane support and always uses walker outside.Pt able to tolerate extended gait with 1 sitting rest break on walker. SA02 on RA 91%, HR 80 BPM. On SOC stairs were not assesses Per PTA visit 10/02/20 UP/DOWN 2 STEPS LEADING INTO HOME stepping down with LLE and up with RLE with pt able to lift walker up and down step with no difficulty I .. On SOC Pt scored 11/28 on Tinetti Balance Assessment placing pt at high  risk for falls..  Today at DC pt scored a 21/28 on Tinetti Balance Assessment placing pt as a med fall risk..Pt did have more than a 4 point statistical improvement. Pt has made progress and has met all goals.  Discharge plan: Discharge from [facility] services as all goals have been met.  Dr Sabino Gasser, office has been notified of DC from [facility] services  with goals met

## 2020-10-09 ENCOUNTER — Encounter: Admit: 2020-10-09 | Discharge: 2020-10-09 | Payer: MEDICAID | Primary: Internal Medicine

## 2021-08-10 IMAGING — CT CT HEAD W/O CM
3 series · 15 of 47 positions shown, 18 images · non-contrast
Comparison: No pertinent prior studies available for comparison.

CLINICAL DATA: Headache, acute, normal neuro exam. Additional
history provided: Headache with vomiting.

EXAM:
CT HEAD WITHOUT CONTRAST
TECHNIQUE: Contiguous axial images were obtained from the base of the skull
through the vertex without intravenous contrast.

[Series 2: head wo · axial · 0.50mm/px · z∈[-125,-0]mm · 9 of 30 slices shown, 12 images]
[im 3/30  brain]
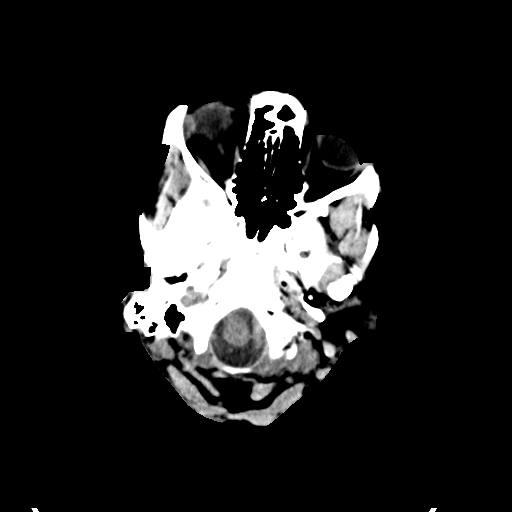
[im 3/30  bone]
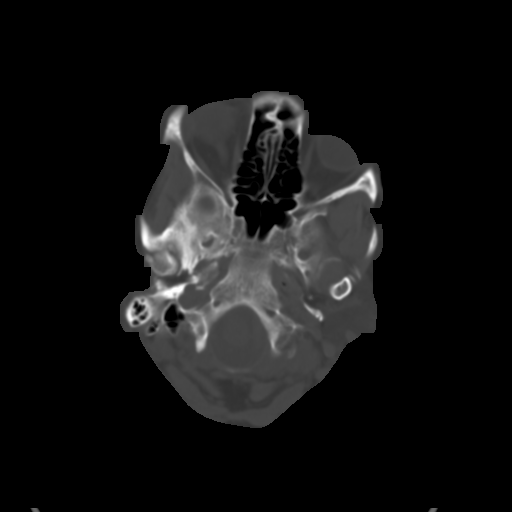
[im 6/30  brain]
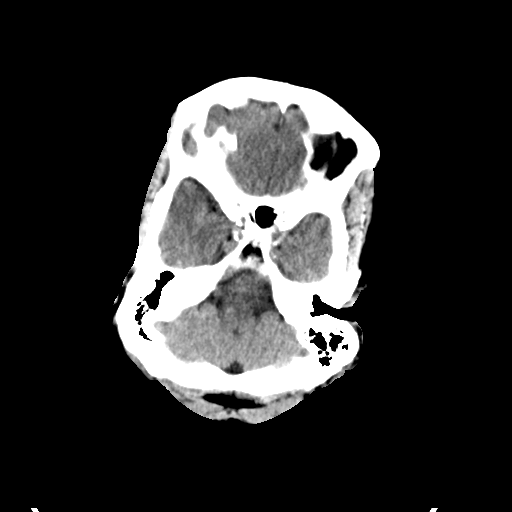
[im 9/30  brain]
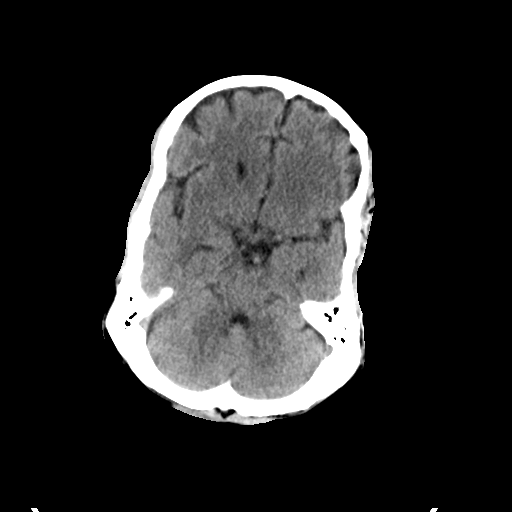
[im 12/30  brain]
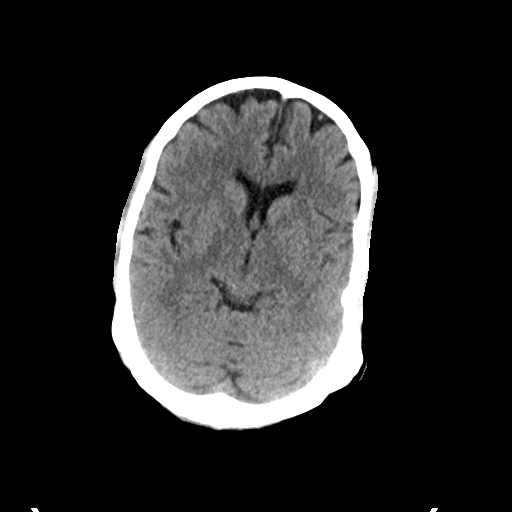
[im 16/30  brain]
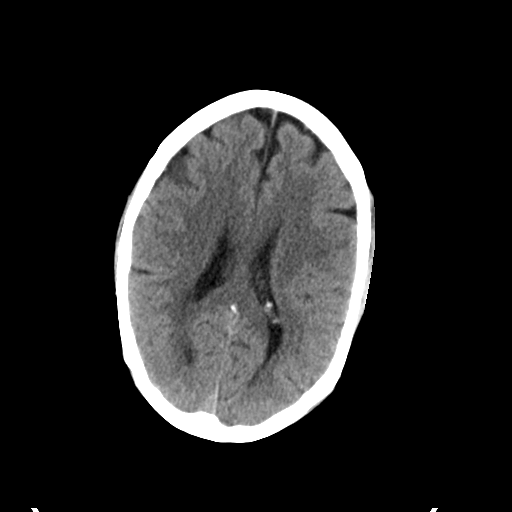
[im 16/30  bone]
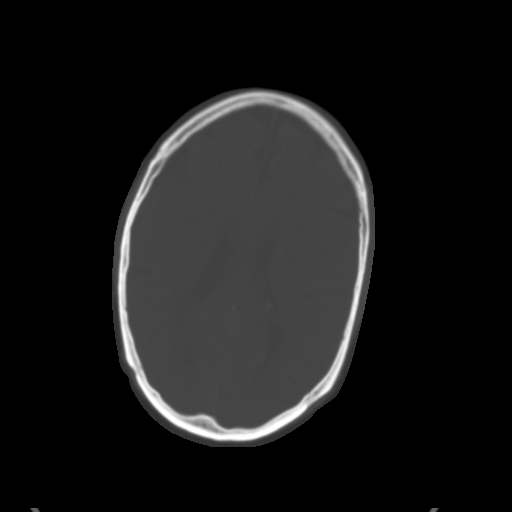
[im 19/30  brain]
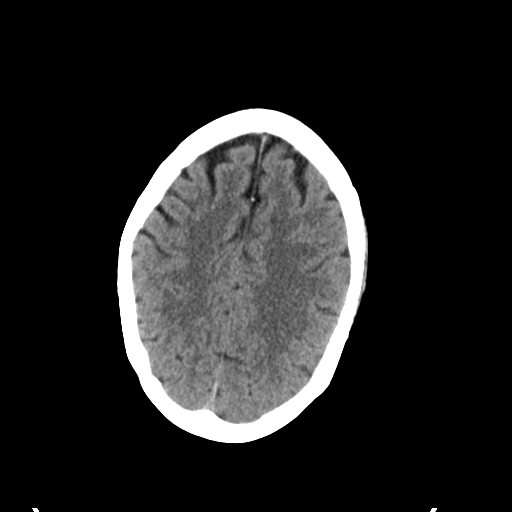
[im 22/30  brain]
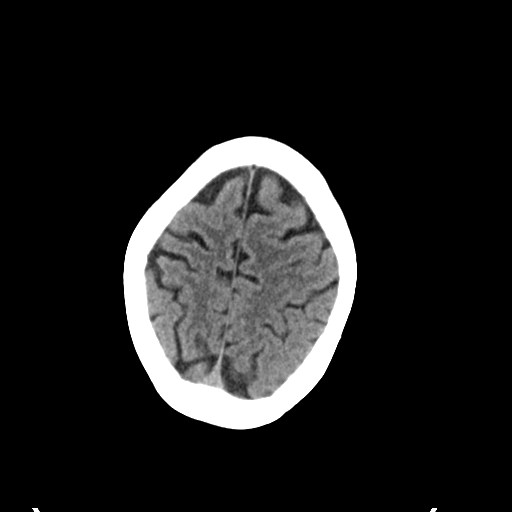
[im 25/30  brain]
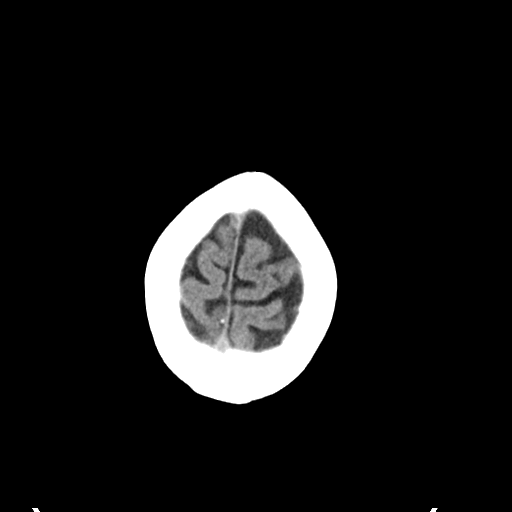
[im 28/30  brain]
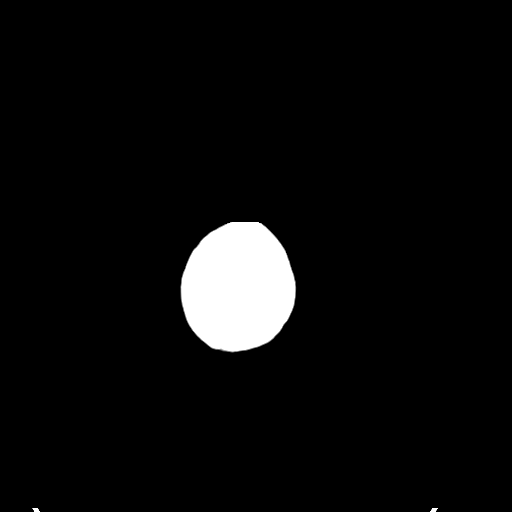
[im 28/30  bone]
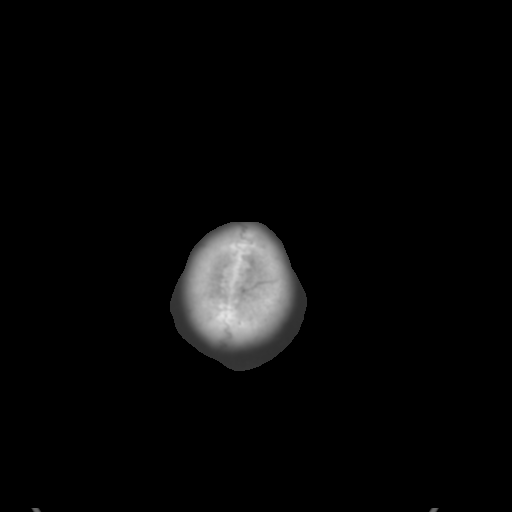

[Series 4: coronal soft tissue · coronal · 0.31mm/px · 3 of 64 slices shown]
[im 22/64  brain]
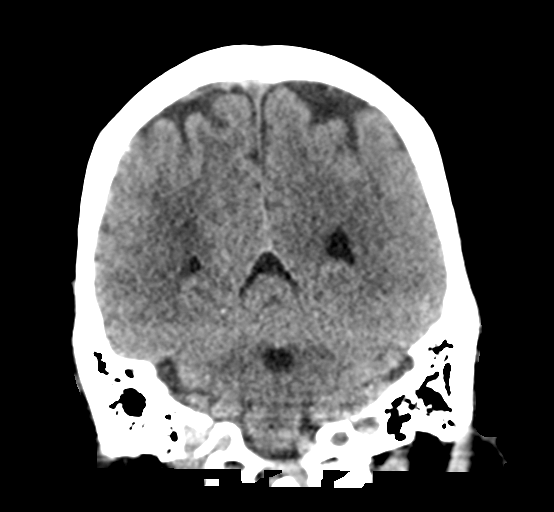
[im 29/64  brain]
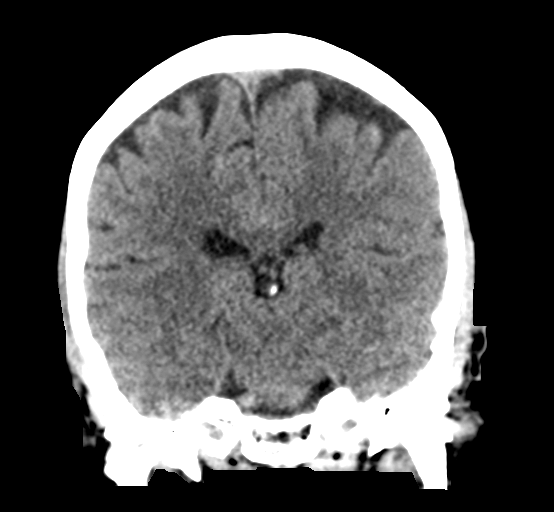
[im 36/64  brain]
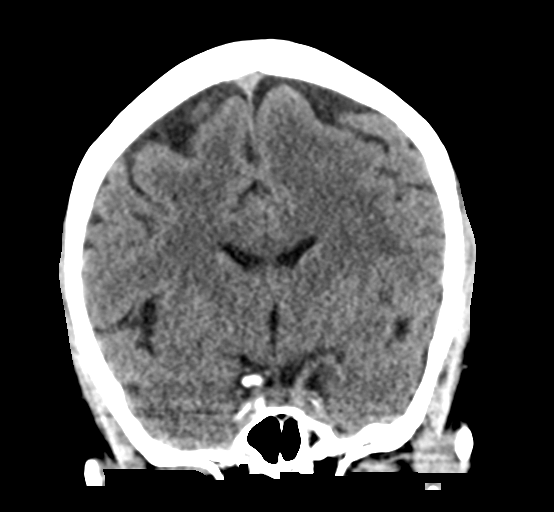

[Series 5: sagittal soft tissue · sagittal · 0.31mm/px · 3 of 46 slices shown]
[im 16/46  brain]
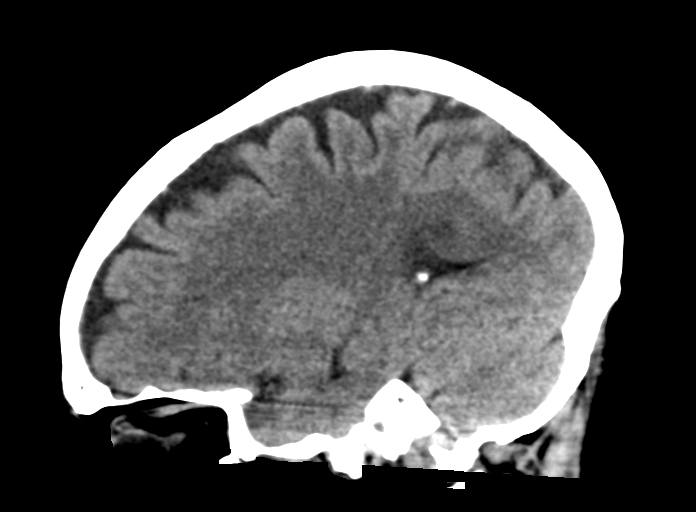
[im 23/46  brain]
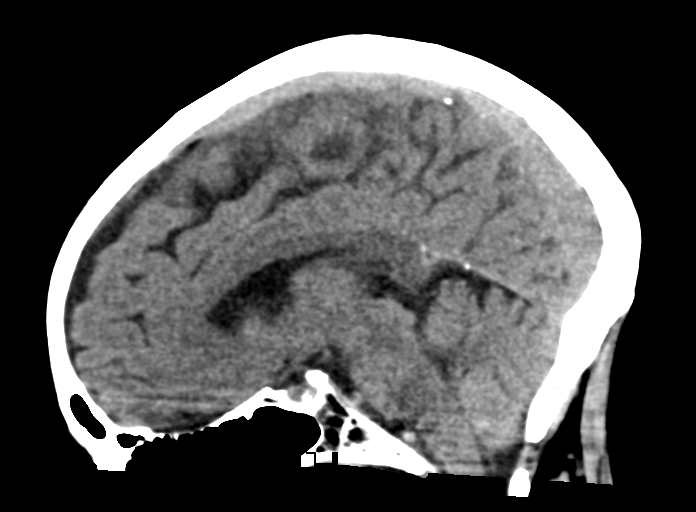
[im 31/46  brain]
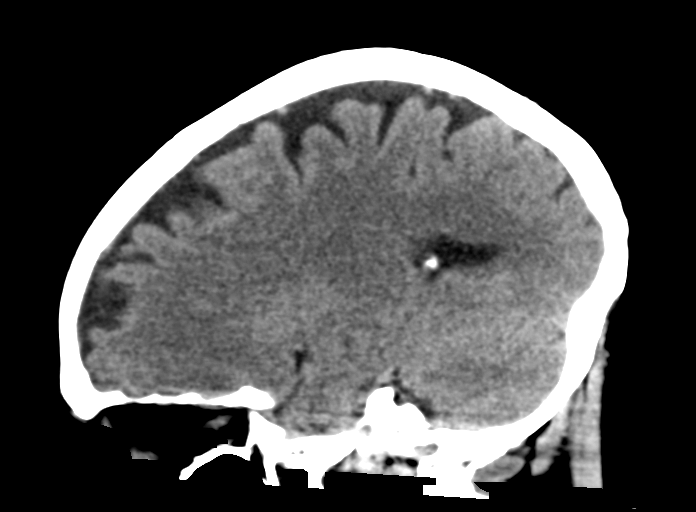

[15 of 47 positions shown; findings below may reference images not displayed]

FINDINGS: Brain:

There is no evidence of acute intracranial hemorrhage, intracranial
mass, midline shift or extra-axial fluid collection.No demarcated
cortical infarction. Patchy hypodensity within the cerebral white
matter is nonspecific, but consistent with chronic small vessel
ischemic disease. Cerebral volume is normal for age.

Vascular: No hyperdense vessel.  Atherosclerotic calcifications.

Skull: Normal. Negative for fracture or focal lesion.

Sinuses/Orbits: Visualized orbits demonstrate no acute abnormality.
No significant paranasal sinus disease or mastoid effusion at the
imaged levels.
IMPRESSION: No evidence of acute intracranial abnormality.

Patchy hypodensity within the cerebral white matter is nonspecific,
but consistent with chronic small vessel ischemic disease.
